# Patient Record
Sex: Male | Born: 1970 | Race: Black or African American | Hispanic: No | Marital: Single | Smoking: Former smoker
Health system: Southern US, Community
[De-identification: ages and names within clinical notes are randomized; demographics above are authoritative.]

## PROBLEM LIST (undated history)

## (undated) DIAGNOSIS — R0600 Dyspnea, unspecified: Secondary | ICD-10-CM

## (undated) DIAGNOSIS — T4145XA Adverse effect of unspecified anesthetic, initial encounter: Secondary | ICD-10-CM

## (undated) DIAGNOSIS — J189 Pneumonia, unspecified organism: Secondary | ICD-10-CM

## (undated) DIAGNOSIS — T8859XA Other complications of anesthesia, initial encounter: Secondary | ICD-10-CM

## (undated) DIAGNOSIS — I255 Ischemic cardiomyopathy: Secondary | ICD-10-CM

## (undated) DIAGNOSIS — I639 Cerebral infarction, unspecified: Secondary | ICD-10-CM

## (undated) DIAGNOSIS — G4730 Sleep apnea, unspecified: Secondary | ICD-10-CM

## (undated) DIAGNOSIS — I10 Essential (primary) hypertension: Secondary | ICD-10-CM

## (undated) DIAGNOSIS — K219 Gastro-esophageal reflux disease without esophagitis: Secondary | ICD-10-CM

## (undated) DIAGNOSIS — I2510 Atherosclerotic heart disease of native coronary artery without angina pectoris: Secondary | ICD-10-CM

## (undated) DIAGNOSIS — E119 Type 2 diabetes mellitus without complications: Secondary | ICD-10-CM

## (undated) HISTORY — DX: Essential (primary) hypertension: I10

## (undated) HISTORY — DX: Ischemic cardiomyopathy: I25.5

## (undated) HISTORY — DX: Morbid (severe) obesity due to excess calories: E66.01

## (undated) HISTORY — DX: Cerebral infarction, unspecified: I63.9

## (undated) HISTORY — DX: Atherosclerotic heart disease of native coronary artery without angina pectoris: I25.10

---

## 1993-11-24 HISTORY — PX: EXTERNAL EAR SURGERY: SHX627

## 2013-09-23 ENCOUNTER — Encounter (HOSPITAL_COMMUNITY): Payer: Self-pay | Admitting: Emergency Medicine

## 2013-09-23 ENCOUNTER — Emergency Department (HOSPITAL_COMMUNITY)
Admission: EM | Admit: 2013-09-23 | Discharge: 2013-09-23 | Disposition: A | Discharge status: Home / Self Care | Payer: Self-pay | Attending: Emergency Medicine | Admitting: Emergency Medicine

## 2013-09-23 DIAGNOSIS — I10 Essential (primary) hypertension: Secondary | ICD-10-CM | POA: Insufficient documentation

## 2013-09-23 DIAGNOSIS — E669 Obesity, unspecified: Secondary | ICD-10-CM | POA: Insufficient documentation

## 2013-09-23 DIAGNOSIS — R040 Epistaxis: Secondary | ICD-10-CM | POA: Insufficient documentation

## 2013-09-23 DIAGNOSIS — E119 Type 2 diabetes mellitus without complications: Secondary | ICD-10-CM | POA: Insufficient documentation

## 2013-09-23 DIAGNOSIS — F17200 Nicotine dependence, unspecified, uncomplicated: Secondary | ICD-10-CM | POA: Insufficient documentation

## 2013-09-23 HISTORY — DX: Type 2 diabetes mellitus without complications: E11.9

## 2013-09-23 NOTE — ED Provider Notes (Signed)
CSN: [id number]     Arrival date & time 09/23/13  0004 History   First MD Initiated Contact with Patient 09/23/13 0025     Chief Complaint  Patient presents with  . Epistaxis   (Consider location/radiation/quality/duration/timing/severity/associated sxs/prior Treatment) HPI 42 year old male presents to emergency room with complaint of nose bleed.  He reports he had a nosebleed starting this morning, but stopped.  It reoccurred again in the afternoon.  He was seen at urgent care, who placed packing and started him on blood pressure medication.  He reports he began to bleed heavily again around the packing this evening around 8:30 PM.  He removed the packing.  He has been using over-the-counter nose plugs in both nares without improvement.  No headache.  No chest pain, no shortness of breath.  He reports he was taking some cold medicine last week for a URI.  Past Medical History  Diagnosis Date  . Diabetes mellitus without complication    History reviewed. No pertinent past surgical history. No family history on file. History  Substance Use Topics  . Smoking status: Current Every Day Smoker  . Smokeless tobacco: Not on file  . Alcohol Use: Yes     Comment: seldom    Review of Systems  Allergies  Bee venom  Home Medications  No current outpatient prescriptions on file. BP 163/86  Pulse 85  Temp(Src) 97.7 F (36.5 C) (Oral)  Resp 15  SpO2 96% Physical Exam  Constitutional: He appears distressed.  Obese male, uncomfortable appearing  HENT:  Head: Normocephalic and atraumatic.  Left Ear: External ear normal.  Mouth/Throat: Oropharynx is clear and moist.  Patient with cotton plugs in both nares  Eyes: Conjunctivae and EOM are normal. Pupils are equal, round, and reactive to light.  Cardiovascular: Normal rate, regular rhythm and normal heart sounds.  Exam reveals no gallop and no friction rub.   No murmur heard. Pulmonary/Chest: Effort normal and breath sounds normal. No  respiratory distress. He has no wheezes. He has no rales. He exhibits no tenderness.    See History of Present Illness; otherwise all other systems are reviewed and negative ED Course  Procedures (including critical care time) Labs Review Labs Reviewed - No data to display Imaging Review No results found.    MDM   1. Epistaxis, recurrent   2. HTN (hypertension)    42 year old male with left-sided epistaxis.  Packing removed.  Had patient blow his nose forcefully all clots removed.  Lidocaine with epinephrine atomized into left narrow.  Patient held pressure for 5 minutes.  No further leading.  Patient observed for an hour without further notice.  Bleeding.  Patient instructed to repeat the process of nose blowing and pressure.  Should he have return of epistaxis.    Olivia Mackie, MD 09/23/13 [phone]

## 2013-09-23 NOTE — ED Notes (Signed)
Pt seen earlier today at [facility] for epistaxis.  Pt states it started around 1040 this morning and then stopped later in the day.  Pt states [facility] gave him BP meds and packed the L nare.  Pt states around 2030 tonight bleeding started again.  Pt has OTC nose plugs in bilateral nares.  Plugs are saturated and dripping blood.  Plugs left in nares at this time.  Pt given cloth to hold over nose to catch blood and also given a chuck to lay across chest.  ENT cart outside room.

## 2016-12-30 ENCOUNTER — Encounter (HOSPITAL_COMMUNITY): Payer: Self-pay | Admitting: Emergency Medicine

## 2016-12-30 ENCOUNTER — Emergency Department (HOSPITAL_COMMUNITY): Payer: BLUE CROSS/BLUE SHIELD

## 2016-12-30 ENCOUNTER — Inpatient Hospital Stay (HOSPITAL_COMMUNITY)
Admission: EM | Admit: 2016-12-30 | Discharge: 2017-01-02 | DRG: 065 | Disposition: A | Discharge status: Rehabilitation Facility | Payer: BLUE CROSS/BLUE SHIELD | Attending: Internal Medicine | Admitting: Internal Medicine

## 2016-12-30 DIAGNOSIS — R29810 Facial weakness: Secondary | ICD-10-CM | POA: Diagnosis present

## 2016-12-30 DIAGNOSIS — R739 Hyperglycemia, unspecified: Secondary | ICD-10-CM

## 2016-12-30 DIAGNOSIS — I10 Essential (primary) hypertension: Secondary | ICD-10-CM | POA: Diagnosis present

## 2016-12-30 DIAGNOSIS — G463 Brain stem stroke syndrome: Secondary | ICD-10-CM | POA: Diagnosis present

## 2016-12-30 DIAGNOSIS — Z6841 Body Mass Index (BMI) 40.0 and over, adult: Secondary | ICD-10-CM

## 2016-12-30 DIAGNOSIS — R51 Headache: Secondary | ICD-10-CM | POA: Diagnosis not present

## 2016-12-30 DIAGNOSIS — E871 Hypo-osmolality and hyponatremia: Secondary | ICD-10-CM | POA: Diagnosis present

## 2016-12-30 DIAGNOSIS — E785 Hyperlipidemia, unspecified: Secondary | ICD-10-CM | POA: Diagnosis present

## 2016-12-30 DIAGNOSIS — Z91030 Bee allergy status: Secondary | ICD-10-CM

## 2016-12-30 DIAGNOSIS — Z79899 Other long term (current) drug therapy: Secondary | ICD-10-CM

## 2016-12-30 DIAGNOSIS — Z87891 Personal history of nicotine dependence: Secondary | ICD-10-CM

## 2016-12-30 DIAGNOSIS — E119 Type 2 diabetes mellitus without complications: Secondary | ICD-10-CM

## 2016-12-30 DIAGNOSIS — Z8249 Family history of ischemic heart disease and other diseases of the circulatory system: Secondary | ICD-10-CM

## 2016-12-30 DIAGNOSIS — E1165 Type 2 diabetes mellitus with hyperglycemia: Secondary | ICD-10-CM | POA: Diagnosis present

## 2016-12-30 DIAGNOSIS — I639 Cerebral infarction, unspecified: Secondary | ICD-10-CM | POA: Diagnosis present

## 2016-12-30 DIAGNOSIS — R260 Ataxic gait: Secondary | ICD-10-CM | POA: Diagnosis present

## 2016-12-30 DIAGNOSIS — R509 Fever, unspecified: Secondary | ICD-10-CM

## 2016-12-30 DIAGNOSIS — R519 Headache, unspecified: Secondary | ICD-10-CM

## 2016-12-30 DIAGNOSIS — Z7982 Long term (current) use of aspirin: Secondary | ICD-10-CM

## 2016-12-30 DIAGNOSIS — Z7951 Long term (current) use of inhaled steroids: Secondary | ICD-10-CM

## 2016-12-30 DIAGNOSIS — Z7984 Long term (current) use of oral hypoglycemic drugs: Secondary | ICD-10-CM

## 2016-12-30 DIAGNOSIS — E860 Dehydration: Secondary | ICD-10-CM | POA: Diagnosis present

## 2016-12-30 DIAGNOSIS — I63212 Cerebral infarction due to unspecified occlusion or stenosis of left vertebral arteries: Principal | ICD-10-CM | POA: Diagnosis present

## 2016-12-30 DIAGNOSIS — Z8349 Family history of other endocrine, nutritional and metabolic diseases: Secondary | ICD-10-CM

## 2016-12-30 DIAGNOSIS — N179 Acute kidney failure, unspecified: Secondary | ICD-10-CM | POA: Diagnosis present

## 2016-12-30 DIAGNOSIS — D72829 Elevated white blood cell count, unspecified: Secondary | ICD-10-CM | POA: Diagnosis present

## 2016-12-30 DIAGNOSIS — Z79891 Long term (current) use of opiate analgesic: Secondary | ICD-10-CM

## 2016-12-30 HISTORY — DX: Cerebral infarction, unspecified: I63.9

## 2016-12-30 LAB — I-STAT TROPONIN, ED: TROPONIN I, POC: 0 ng/mL (ref 0.00–0.08)

## 2016-12-30 LAB — I-STAT CHEM 8, ED
BUN: 23 mg/dL — ABNORMAL HIGH (ref 6–20)
CALCIUM ION: 1.15 mmol/L (ref 1.15–1.40)
Chloride: 88 mmol/L — ABNORMAL LOW (ref 101–111)
Creatinine, Ser: 1.3 mg/dL — ABNORMAL HIGH (ref 0.61–1.24)
Glucose, Bld: 429 mg/dL — ABNORMAL HIGH (ref 65–99)
HEMATOCRIT: 51 % (ref 39.0–52.0)
HEMOGLOBIN: 17.3 g/dL — AB (ref 13.0–17.0)
Potassium: 4.1 mmol/L (ref 3.5–5.1)
SODIUM: 134 mmol/L — AB (ref 135–145)
TCO2: 33 mmol/L (ref 0–100)

## 2016-12-30 LAB — DIFFERENTIAL
BASOS ABS: 0 10*3/uL (ref 0.0–0.1)
BASOS PCT: 0 %
EOS ABS: 0.1 10*3/uL (ref 0.0–0.7)
EOS PCT: 1 %
LYMPHS ABS: 2.5 10*3/uL (ref 0.7–4.0)
Lymphocytes Relative: 21 %
Monocytes Absolute: 0.6 10*3/uL (ref 0.1–1.0)
Monocytes Relative: 5 %
NEUTROS PCT: 73 %
Neutro Abs: 8.9 10*3/uL — ABNORMAL HIGH (ref 1.7–7.7)

## 2016-12-30 LAB — CBG MONITORING, ED: Glucose-Capillary: 453 mg/dL — ABNORMAL HIGH (ref 65–99)

## 2016-12-30 LAB — CBC
HCT: 46.2 % (ref 39.0–52.0)
HEMOGLOBIN: 15.8 g/dL (ref 13.0–17.0)
MCH: 29.8 pg (ref 26.0–34.0)
MCHC: 34.2 g/dL (ref 30.0–36.0)
MCV: 87.2 fL (ref 78.0–100.0)
Platelets: 267 10*3/uL (ref 150–400)
RBC: 5.3 MIL/uL (ref 4.22–5.81)
RDW: 13.1 % (ref 11.5–15.5)
WBC: 12.2 10*3/uL — ABNORMAL HIGH (ref 4.0–10.5)

## 2016-12-30 LAB — COMPREHENSIVE METABOLIC PANEL
ALBUMIN: 3.5 g/dL (ref 3.5–5.0)
ALT: 16 U/L — ABNORMAL LOW (ref 17–63)
ANION GAP: 10 (ref 5–15)
AST: 21 U/L (ref 15–41)
Alkaline Phosphatase: 53 U/L (ref 38–126)
BILIRUBIN TOTAL: 0.4 mg/dL (ref 0.3–1.2)
BUN: 20 mg/dL (ref 6–20)
CO2: 32 mmol/L (ref 22–32)
Calcium: 8.9 mg/dL (ref 8.9–10.3)
Chloride: 89 mmol/L — ABNORMAL LOW (ref 101–111)
Creatinine, Ser: 1.34 mg/dL — ABNORMAL HIGH (ref 0.61–1.24)
GFR calc Af Amer: >60 mL/min (ref >60)
GLUCOSE: 415 mg/dL — AB (ref 65–99)
POTASSIUM: 4.2 mmol/L (ref 3.5–5.1)
Sodium: 131 mmol/L — ABNORMAL LOW (ref 135–145)
TOTAL PROTEIN: 7.4 g/dL (ref 6.5–8.1)

## 2016-12-30 LAB — URINALYSIS, ROUTINE W REFLEX MICROSCOPIC
Bilirubin Urine: NEGATIVE
Ketones, ur: NEGATIVE mg/dL
Leukocytes, UA: NEGATIVE
NITRITE: NEGATIVE
PH: 5 (ref 5.0–8.0)
Protein, ur: 30 mg/dL — AB
SPECIFIC GRAVITY, URINE: 1.028 (ref 1.005–1.030)
Squamous Epithelial / LPF: NONE SEEN

## 2016-12-30 LAB — PROTIME-INR
INR: 0.98
PROTHROMBIN TIME: 13 s (ref 11.4–15.2)

## 2016-12-30 LAB — APTT: APTT: 30 s (ref 24–36)

## 2016-12-30 MED ORDER — SODIUM CHLORIDE 0.9 % IV BOLUS (SEPSIS)
1000.0000 mL | Freq: Once | INTRAVENOUS | Status: AC
  Administered 2016-12-30: 1000 mL via INTRAVENOUS

## 2016-12-30 NOTE — ED Provider Notes (Signed)
[facility]-EMERGENCY DEPT Provider Note   CSN: [id number] Arrival date & time: 12/30/16  2128  By signing my name below, I, Nicholas Mejia, attest that this documentation has been prepared under the direction and in the presence of Nicholas Baton, MD.  Electronically Signed: Octavia Mejia, ED Scribe. 12/30/16. 11:50 PM.    History   Chief Complaint Chief Complaint  Patient presents with  . Hyperglycemia  . Numbness  . Weakness   The history is provided by the patient and the EMS personnel. No language interpreter was used.   HPI Comments: Nicholas Mejia is a 46 y.o. male brought in by ambulance, who has a PMHx of DM presents to the Emergency Department complaining of moderate, unchanged left sided facial numbness that began yesterday ~ 12.5 hours ago. Pt reports having an associated headache for the past few days that became gradually worse yesterday. He described as pressure behind his left eye. He notes being seen yesterday for his headache and received toradol and an "unknown blood pressure medication". He notes shortly after he began to have left sided facial numbness. Pt states he went home and fell asleep. When he woke up he noted weakness in his legs and reports difficulty ambulating, stating that he drifted to left. He says that he felt nauseous and diaphoretic as well. Pt has taken extra strength tylenol twice to alleviate his headache which gave him temporary relief the first time but did not modify his pain at all the second time. Pt was diagnosed with pneumonia two weeks ago and finished his course of antibiotic 5 days ago. A1C was 7.9 one month ago. Pt is compliant with his Metformin. Pt does not have any current pain. He denies visual disturbances, chest pain, and shortness of breath.  Past Medical History:  Diagnosis Date  . Diabetes mellitus without complication [facility])     Patient Active Problem List   Diagnosis Date Noted  . 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 Diabetes  mellitus without complication (HCC) 12/31/2016  . AKI (acute kidney injury) (HCC) 12/31/2016  . Tobacco abuse 12/31/2016    Past Surgical History:  Procedure Laterality Date  . EXTERNAL EAR SURGERY         Home Medications    Prior to Admission medications   Medication Sig Start Date End Date Taking? Authorizing Provider  aspirin EC 81 MG tablet Take 162 mg by mouth daily.   Yes Historical Provider, MD  chlorthalidone (HYGROTON) 25 MG tablet Take 25 mg by mouth daily.   Yes Historical Provider, MD  fluticasone (FLONASE) 50 MCG/ACT nasal spray Place 1 spray into both nostrils daily.   Yes Historical Provider, MD  fluticasone-salmeterol (ADVAIR HFA) 115-21 MCG/ACT inhaler Inhale 2 puffs into the lungs 2 (two) times daily as needed (shortness of breath).   Yes Historical Provider, MD  guaiFENesin-codeine 100-10 MG/5ML syrup Take 5 mLs by mouth 3 (three) times daily as needed for cough.   Yes Historical Provider, MD  metFORMIN (GLUCOPHAGE-XR) 500 MG 24 hr tablet Take 500 mg by mouth See admin instructions. Take 1 tablet every morning, takes another tablet in the evening if remembers   Yes Historical Provider, MD  montelukast (SINGULAIR) 10 MG tablet Take 10 mg by mouth at bedtime.   Yes Historical Provider, MD  Turmeric 500 MG TABS Take 1,000 mg by mouth 2 (two) times daily.   Yes Historical Provider, MD    Family History Family History  Problem Relation Age of Onset  . Heart attack Father   .  Gout Brother     Social History Social History  Substance Use Topics  . Smoking status: Former Games developermoker  . Smokeless tobacco: Not on file  . Alcohol use Yes     Comment: seldom     Allergies   Bee venom   Review of Systems Review of Systems  Constitutional: Positive for diaphoresis (resolved). Negative for fever.  Eyes: Negative for visual disturbance.  Respiratory: Negative for shortness of breath.   Cardiovascular: Negative for chest pain.  Gastrointestinal: Positive for nausea  (resolved).  Neurological: Positive for weakness, numbness (left sided facial numbness) and headaches (resolved).  All other systems reviewed and are negative.    Physical Exam Updated Vital Signs BP (!) 203/98   Pulse 85   Temp 98.5 F (36.9 C) (Oral)   Resp 17   Ht 6\' 2"  (1.88 m)   Wt (!) 320 lb (145.2 kg)   SpO2 100%   BMI 41.09 kg/m   Physical Exam  Constitutional: He is oriented to person, place, and time. He appears well-developed and well-nourished.  overweight  HENT:  Head: Normocephalic and atraumatic.  Eyes: EOM are normal. Pupils are equal, round, and reactive to light.  Neck: Neck supple.  Cardiovascular: Normal rate, regular rhythm and normal heart sounds.   No murmur heard. Pulmonary/Chest: Effort normal and breath sounds normal. No respiratory distress. He has no wheezes.  Abdominal: Soft. Bowel sounds are normal. There is no tenderness. There is no rebound.  Musculoskeletal: He exhibits no edema.  Neurological: He is alert and oriented to person, place, and time.  Renal nerves II through XII intact, 5 out of 5 strength in all 4 extremities, no dysmetria to finger-nose-finger, on gait testing, patient very unsteady and appears to favor left, unable to tandem gait  Skin: Skin is warm and dry.  Psychiatric: He has a normal mood and affect.  Nursing note and vitals reviewed.    ED Treatments / Results  DIAGNOSTIC STUDIES: Oxygen Saturation is 98% on RA, normal by my interpretation.  COORDINATION OF CARE:  11:34 PM Discussed treatment plan with pt at bedside and pt agreed to plan.  Labs (all labs ordered are listed, but only abnormal results are displayed) Labs Reviewed  CBC - Abnormal; Notable for the following:       Result Value   WBC 12.2 (*)    All other components within normal limits  DIFFERENTIAL - Abnormal; Notable for the following:    Neutro Abs 8.9 (*)    All other components within normal limits  COMPREHENSIVE METABOLIC PANEL - Abnormal;  Notable for the following:    Sodium 131 (*)    Chloride 89 (*)    Glucose, Bld 415 (*)    Creatinine, Ser 1.34 (*)    ALT 16 (*)    All other components within normal limits  URINALYSIS, ROUTINE W REFLEX MICROSCOPIC - Abnormal; Notable for the following:    Glucose, UA >=500 (*)    Hgb urine dipstick SMALL (*)    Protein, ur 30 (*)    Bacteria, UA RARE (*)    All other components within normal limits  CBG MONITORING, ED - Abnormal; Notable for the following:    Glucose-Capillary 453 (*)    All other components within normal limits  I-STAT CHEM 8, ED - Abnormal; Notable for the following:    Sodium 134 (*)    Chloride 88 (*)    BUN 23 (*)    Creatinine, Ser 1.30 (*)  Glucose, Bld 429 (*)    Hemoglobin 17.3 (*)    All other components within normal limits  PROTIME-INR  APTT  I-STAT TROPOININ, ED    EKG  EKG Interpretation  Date/Time:  Tuesday December 30 2016 21:31:59 EST Ventricular Rate:  85 PR Interval:  194 QRS Duration: 82 QT Interval:  366 QTC Calculation: 435 R Axis:   133 Text Interpretation:  Normal sinus rhythm Biatrial enlargement Right axis deviation Pulmonary disease pattern Right ventricular hypertrophy T wave abnormality, consider inferior ischemia Abnormal ECG Confirmed by [facility] MD, Barbara Cower [id number]) on 12/30/2016 9:56:05 PM       Radiology Ct Head Wo Contrast  Result Date: 12/30/2016 CLINICAL DATA:  Left-sided numbness starting at 11 a.m. History of hypertension. EXAM: CT HEAD WITHOUT CONTRAST TECHNIQUE: Contiguous axial images were obtained from the base of the skull through the vertex without intravenous contrast. COMPARISON:  None. FINDINGS: Brain: No evidence of acute infarction, hemorrhage, hydrocephalus, extra-axial collection or mass lesion/mass effect. Vascular: Vascular calcifications are present. Skull: Normal. Negative for fracture or focal lesion. Sinuses/Orbits: No acute finding. Other: None. IMPRESSION: No acute intracranial abnormalities.  Electronically Signed   By: Burman Nieves M.D.   On: 12/30/2016 22:25   Mr Brain Wo Contrast  Result Date: 12/31/2016 CLINICAL DATA:  46 y/o M; left-sided facial numbness, headache, pressure behind the left eye, and weakness in the legs. EXAM: MRI HEAD WITHOUT CONTRAST TECHNIQUE: Multiplanar, multiecho pulse sequences of the brain and surrounding structures were obtained without intravenous contrast. COMPARISON:  12/30/2016 CT head. FINDINGS: Brain: Subcentimeter focus of diffusion restriction within the left posterolateral medulla. Scattered foci of T2 FLAIR hyperintensity in subcortical and periventricular white matter are nonspecific. Mild diffuse brain parenchymal volume loss. No abnormal susceptibility hypointensity to indicate intracranial hemorrhage. No focal mass effect, hydrocephalus, or extra-axial collection. Vascular: Normal flow voids. Skull and upper cervical spine: Normal marrow signal. Sinuses/Orbits: Negative.  Underpneumatized frontal sinuses. Other: None. IMPRESSION: 1. Subcentimeter acute/early subacute infarction within the left posterolateral medulla. 2. Nonspecific T2 FLAIR hyperintense white matter foci unexpected for age probably represent microvascular ischemic changes particularly in the setting of diabetes, less likely migraine headache or sequelae of demyelination, vasculitis, and other infectious/inflammatory processes. These results were called by telephone at the time of interpretation on 12/31/2016 at 12:52 am to Dr. Ross Marcus , who verbally acknowledged these results. Electronically Signed   By: Mitzi Hansen M.D.   On: 12/31/2016 00:53    Procedures Procedures (including critical care time)  Medications Ordered in ED Medications  insulin glargine (LANTUS) injection 3 Units (not administered)  insulin aspart (novoLOG) injection 0-9 Units (not administered)  0.9 %  sodium chloride infusion (not administered)  sodium chloride 0.9 % bolus 1,000 mL (0 mLs  Intravenous Stopped 12/31/16 0140)     Initial Impression / Assessment and Plan / ED Course  I have reviewed the triage vital signs and the nursing notes.  Pertinent labs & imaging results that were available during my care of the patient were reviewed by me and considered in my medical decision making (see chart for details).     Patient presents with headache and left-sided facial numbness as well as gait difficulty. Reports symptom onset on Sunday with noticing gait disturbance on Monday evening. Has seen his primary doctor earlier this morning. Nontoxic. Vital signs notable for hypertension. He has fluent speech. Initial neuro exam is reassuring; however, he has difficulty with normal gait and is unable to tandem gait. CT scan from triage is  negative. Patient given fluids for his hyperglycemia. MRI ordered. MRI concerning for subacute infarct. Discussed with neurology. Will admit to the hospitalist.  Final Clinical Impressions(s) / ED Diagnoses   Final diagnoses:  Cerebrovascular accident (CVA), unspecified mechanism (HCC)  Hyperglycemia    I personally performed the services described in this documentation, which was scribed in my presence. The recorded information has been reviewed and is accurate.  New Prescriptions New Prescriptions   No medications on file     Nicholas Baton, MD 12/31/16 [phone]

## 2016-12-30 NOTE — ED Triage Notes (Signed)
Per EMS,  Pt began to experience headache and  left sided facial numbness about 11am yesterday.  Pt went went to his primary provider today.  BP was "high" given some medication (not sure what) which decreased bp.  Was told to come back   Was given a shot of tordol, which made the headache feel better temporally.  In triage he had trouble walking, drifted to the left.

## 2016-12-31 ENCOUNTER — Encounter (HOSPITAL_COMMUNITY): Payer: Self-pay | Admitting: Internal Medicine

## 2016-12-31 ENCOUNTER — Encounter: Payer: Self-pay | Admitting: *Deleted

## 2016-12-31 ENCOUNTER — Inpatient Hospital Stay (HOSPITAL_COMMUNITY): Payer: BLUE CROSS/BLUE SHIELD

## 2016-12-31 ENCOUNTER — Emergency Department (HOSPITAL_COMMUNITY): Payer: BLUE CROSS/BLUE SHIELD

## 2016-12-31 DIAGNOSIS — E1165 Type 2 diabetes mellitus with hyperglycemia: Secondary | ICD-10-CM | POA: Diagnosis not present

## 2016-12-31 DIAGNOSIS — I6789 Other cerebrovascular disease: Secondary | ICD-10-CM

## 2016-12-31 DIAGNOSIS — E119 Type 2 diabetes mellitus without complications: Secondary | ICD-10-CM

## 2016-12-31 DIAGNOSIS — I10 Essential (primary) hypertension: Secondary | ICD-10-CM | POA: Diagnosis not present

## 2016-12-31 DIAGNOSIS — Z7951 Long term (current) use of inhaled steroids: Secondary | ICD-10-CM | POA: Diagnosis not present

## 2016-12-31 DIAGNOSIS — R29810 Facial weakness: Secondary | ICD-10-CM | POA: Diagnosis present

## 2016-12-31 DIAGNOSIS — R51 Headache: Secondary | ICD-10-CM | POA: Diagnosis present

## 2016-12-31 DIAGNOSIS — I63012 Cerebral infarction due to thrombosis of left vertebral artery: Secondary | ICD-10-CM | POA: Diagnosis not present

## 2016-12-31 DIAGNOSIS — D72829 Elevated white blood cell count, unspecified: Secondary | ICD-10-CM | POA: Diagnosis not present

## 2016-12-31 DIAGNOSIS — I63112 Cerebral infarction due to embolism of left vertebral artery: Secondary | ICD-10-CM | POA: Diagnosis not present

## 2016-12-31 DIAGNOSIS — N179 Acute kidney failure, unspecified: Secondary | ICD-10-CM

## 2016-12-31 DIAGNOSIS — N183 Chronic kidney disease, stage 3 (moderate): Secondary | ICD-10-CM | POA: Diagnosis not present

## 2016-12-31 DIAGNOSIS — G463 Brain stem stroke syndrome: Secondary | ICD-10-CM | POA: Diagnosis not present

## 2016-12-31 DIAGNOSIS — I639 Cerebral infarction, unspecified: Secondary | ICD-10-CM | POA: Diagnosis present

## 2016-12-31 DIAGNOSIS — E1159 Type 2 diabetes mellitus with other circulatory complications: Secondary | ICD-10-CM | POA: Diagnosis not present

## 2016-12-31 DIAGNOSIS — R112 Nausea with vomiting, unspecified: Secondary | ICD-10-CM | POA: Diagnosis not present

## 2016-12-31 DIAGNOSIS — G43A1 Cyclical vomiting, intractable: Secondary | ICD-10-CM | POA: Diagnosis not present

## 2016-12-31 DIAGNOSIS — E782 Mixed hyperlipidemia: Secondary | ICD-10-CM | POA: Diagnosis not present

## 2016-12-31 DIAGNOSIS — I69393 Ataxia following cerebral infarction: Secondary | ICD-10-CM | POA: Diagnosis not present

## 2016-12-31 DIAGNOSIS — Z006 Encounter for examination for normal comparison and control in clinical research program: Secondary | ICD-10-CM

## 2016-12-31 DIAGNOSIS — I63212 Cerebral infarction due to unspecified occlusion or stenosis of left vertebral arteries: Secondary | ICD-10-CM | POA: Diagnosis present

## 2016-12-31 DIAGNOSIS — Z8349 Family history of other endocrine, nutritional and metabolic diseases: Secondary | ICD-10-CM | POA: Diagnosis not present

## 2016-12-31 DIAGNOSIS — I63011 Cerebral infarction due to thrombosis of right vertebral artery: Secondary | ICD-10-CM | POA: Diagnosis not present

## 2016-12-31 DIAGNOSIS — E785 Hyperlipidemia, unspecified: Secondary | ICD-10-CM | POA: Diagnosis present

## 2016-12-31 DIAGNOSIS — Z79891 Long term (current) use of opiate analgesic: Secondary | ICD-10-CM | POA: Diagnosis not present

## 2016-12-31 DIAGNOSIS — K30 Functional dyspepsia: Secondary | ICD-10-CM | POA: Diagnosis not present

## 2016-12-31 DIAGNOSIS — E860 Dehydration: Secondary | ICD-10-CM | POA: Diagnosis present

## 2016-12-31 DIAGNOSIS — Z8249 Family history of ischemic heart disease and other diseases of the circulatory system: Secondary | ICD-10-CM | POA: Diagnosis not present

## 2016-12-31 DIAGNOSIS — N182 Chronic kidney disease, stage 2 (mild): Secondary | ICD-10-CM | POA: Diagnosis not present

## 2016-12-31 DIAGNOSIS — Z87891 Personal history of nicotine dependence: Secondary | ICD-10-CM | POA: Diagnosis not present

## 2016-12-31 DIAGNOSIS — Z79899 Other long term (current) drug therapy: Secondary | ICD-10-CM | POA: Diagnosis not present

## 2016-12-31 DIAGNOSIS — Z794 Long term (current) use of insulin: Secondary | ICD-10-CM | POA: Diagnosis not present

## 2016-12-31 DIAGNOSIS — E118 Type 2 diabetes mellitus with unspecified complications: Secondary | ICD-10-CM | POA: Diagnosis not present

## 2016-12-31 DIAGNOSIS — Z91030 Bee allergy status: Secondary | ICD-10-CM | POA: Diagnosis not present

## 2016-12-31 DIAGNOSIS — E871 Hypo-osmolality and hyponatremia: Secondary | ICD-10-CM | POA: Diagnosis not present

## 2016-12-31 DIAGNOSIS — I6300 Cerebral infarction due to thrombosis of unspecified precerebral artery: Secondary | ICD-10-CM

## 2016-12-31 DIAGNOSIS — Z7984 Long term (current) use of oral hypoglycemic drugs: Secondary | ICD-10-CM | POA: Diagnosis not present

## 2016-12-31 DIAGNOSIS — Z6841 Body Mass Index (BMI) 40.0 and over, adult: Secondary | ICD-10-CM | POA: Diagnosis not present

## 2016-12-31 DIAGNOSIS — R260 Ataxic gait: Secondary | ICD-10-CM | POA: Diagnosis present

## 2016-12-31 DIAGNOSIS — Z7982 Long term (current) use of aspirin: Secondary | ICD-10-CM | POA: Diagnosis not present

## 2016-12-31 DIAGNOSIS — K219 Gastro-esophageal reflux disease without esophagitis: Secondary | ICD-10-CM | POA: Diagnosis not present

## 2016-12-31 LAB — ECHOCARDIOGRAM COMPLETE
HEIGHTINCHES: 74 in
WEIGHTICAEL: 5216 [oz_av]

## 2016-12-31 LAB — CREATININE, URINE, RANDOM: Creatinine, Urine: 134.76 mg/dL

## 2016-12-31 LAB — GLUCOSE, CAPILLARY
GLUCOSE-CAPILLARY: 356 mg/dL — AB (ref 65–99)
GLUCOSE-CAPILLARY: 252 mg/dL — AB (ref 65–99)
Glucose-Capillary: 414 mg/dL — ABNORMAL HIGH (ref 65–99)
Glucose-Capillary: 398 mg/dL — ABNORMAL HIGH (ref 65–99)
Glucose-Capillary: 266 mg/dL — ABNORMAL HIGH (ref 65–99)

## 2016-12-31 LAB — RAPID URINE DRUG SCREEN, HOSP PERFORMED
Amphetamines: NOT DETECTED
Barbiturates: NOT DETECTED
Benzodiazepines: NOT DETECTED
Cocaine: NOT DETECTED
OPIATES: NOT DETECTED
TETRAHYDROCANNABINOL: NOT DETECTED

## 2016-12-31 LAB — TROPONIN I
TROPONIN I: 0.03 ng/mL — AB (ref <0.03)
Troponin I: <0.03 ng/mL (ref <0.03)

## 2016-12-31 LAB — SODIUM, URINE, RANDOM: Sodium, Ur: 11 mmol/L

## 2016-12-31 MED ORDER — ASPIRIN 325 MG PO TABS
325.0000 mg | ORAL_TABLET | Freq: Every day | ORAL | Status: DC
  Administered 2017-01-02: 325 mg via ORAL
  Filled 2016-12-31 (×2): qty 1

## 2016-12-31 MED ORDER — SENNOSIDES-DOCUSATE SODIUM 8.6-50 MG PO TABS: 1.0000 | ORAL_TABLET | Freq: Every evening | ORAL | Status: DC | PRN

## 2016-12-31 MED ORDER — ACETAMINOPHEN 325 MG PO TABS: 650.0000 mg | ORAL_TABLET | ORAL | Status: DC | PRN

## 2016-12-31 MED ORDER — ONDANSETRON HCL 4 MG/2ML IJ SOLN
4.0000 mg | Freq: Four times a day (QID) | INTRAMUSCULAR | Status: DC | PRN
  Administered 2016-12-31 – 2017-01-02 (×5): 4 mg via INTRAVENOUS
  Filled 2016-12-31 (×5): qty 2

## 2016-12-31 MED ORDER — INSULIN GLARGINE 100 UNIT/ML ~~LOC~~ SOLN
3.0000 [IU] | Freq: Every day | SUBCUTANEOUS | Status: DC
  Filled 2016-12-31: qty 0.03

## 2016-12-31 MED ORDER — ASPIRIN 325 MG PO TABS
325.0000 mg | ORAL_TABLET | Freq: Every day | ORAL | Status: DC
  Administered 2016-12-31: 325 mg via ORAL
  Filled 2016-12-31: qty 1

## 2016-12-31 MED ORDER — INSULIN GLARGINE 100 UNIT/ML ~~LOC~~ SOLN
10.0000 [IU] | Freq: Once | SUBCUTANEOUS | Status: AC
  Administered 2016-12-31: 10 [IU] via SUBCUTANEOUS
  Filled 2016-12-31: qty 0.1

## 2016-12-31 MED ORDER — ATORVASTATIN CALCIUM 40 MG PO TABS
40.0000 mg | ORAL_TABLET | Freq: Every day | ORAL | Status: DC
  Administered 2016-12-31 – 2017-01-01 (×3): 40 mg via ORAL
  Filled 2016-12-31 (×3): qty 1

## 2016-12-31 MED ORDER — STROKE: EARLY STAGES OF RECOVERY BOOK
Freq: Once | Status: AC
  Administered 2016-12-31: 07:00:00
  Filled 2016-12-31: qty 1

## 2016-12-31 MED ORDER — ASPIRIN 300 MG RE SUPP: 300.0000 mg | Freq: Every day | RECTAL | Status: DC

## 2016-12-31 MED ORDER — CLOPIDOGREL BISULFATE 75 MG PO TABS
75.0000 mg | ORAL_TABLET | Freq: Every day | ORAL | Status: DC
  Administered 2016-12-31 – 2017-01-02 (×2): 75 mg via ORAL
  Filled 2016-12-31 (×3): qty 1

## 2016-12-31 MED ORDER — SODIUM CHLORIDE 0.9 % IV SOLN
INTRAVENOUS | Status: DC
  Administered 2016-12-31: 03:00:00 via INTRAVENOUS

## 2016-12-31 MED ORDER — ENOXAPARIN SODIUM 40 MG/0.4ML ~~LOC~~ SOLN
40.0000 mg | SUBCUTANEOUS | Status: DC
  Administered 2016-12-31 – 2017-01-02 (×3): 40 mg via SUBCUTANEOUS
  Filled 2016-12-31 (×3): qty 0.4

## 2016-12-31 MED ORDER — IOPAMIDOL (ISOVUE-370) INJECTION 76%
INTRAVENOUS | Status: AC
Start: 2016-12-31 — End: 2016-12-31
  Administered 2016-12-31: 50 mL via INTRAVENOUS
  Filled 2016-12-31: qty 50

## 2016-12-31 MED ORDER — INSULIN ASPART 100 UNIT/ML ~~LOC~~ SOLN
0.0000 [IU] | Freq: Three times a day (TID) | SUBCUTANEOUS | Status: DC
Start: 2016-12-31 — End: 2017-01-02
  Administered 2016-12-31: 5 [IU] via SUBCUTANEOUS
  Administered 2016-12-31 (×2): 9 [IU] via SUBCUTANEOUS
  Administered 2017-01-01 (×2): 5 [IU] via SUBCUTANEOUS
  Administered 2017-01-01: 3 [IU] via SUBCUTANEOUS
  Administered 2017-01-02: 5 [IU] via SUBCUTANEOUS
  Administered 2017-01-02: 3 [IU] via SUBCUTANEOUS

## 2016-12-31 MED ORDER — INSULIN GLARGINE 100 UNIT/ML ~~LOC~~ SOLN
20.0000 [IU] | Freq: Every day | SUBCUTANEOUS | Status: DC
  Administered 2016-12-31 – 2017-01-02 (×2): 20 [IU] via SUBCUTANEOUS
  Filled 2016-12-31 (×3): qty 0.2

## 2016-12-31 MED ORDER — ACETAMINOPHEN 650 MG RE SUPP: 650.0000 mg | RECTAL | Status: DC | PRN

## 2016-12-31 MED ORDER — ACETAMINOPHEN 160 MG/5ML PO SOLN: 650.0000 mg | ORAL | Status: DC | PRN

## 2016-12-31 NOTE — Evaluation (Signed)
Occupational Therapy Evaluation Patient Details Name: Nicholas Mejia MRN: [id number] DOB: 07/13/1971 Today's Date: 12/31/2016    History of Present Illness 46 yo male admitted with L facial numbness, HA and gait instability. MRI + Subcentimeter acute/early subacute infarction within the leftposterolateral medulla.T2 FLAIR hyperintense white matter foci unexpectedfor age probably represent microvascular ischemic changes particularly in the setting of diabetes, less likely migraine headache or sequelae of demyelination, vasculitis, and other infectious/inflammatory processes. CT neck (+)  calcified plaque at the right vertebral artery origin with 70% stenosis. There is noncalcified plaque at the left vertebral artery origin with 50% stenosis. There is atherosclerotic disease affecting the proximal intracranial vertebral arteries with severe stenosis on the left beyond PICA, 70% or greater. There is stenosis of the distal right vertebral artery just proximal to the basilar estimatedat 50%.   Clinical Impression   PT admitted with L medulla infarct. Pt currently with functional limitiations due to the deficits listed below (see OT problem list). PTA was working and independent. Pt currently plans to have family (A) upon d/c .  Pt will benefit from skilled OT to increase their independence and safety with adls and balance to allow discharge outpatient for balance.    Follow Up Recommendations    outpatient   Equipment Recommendations  None recommended by OT    Recommendations for Other Services       Precautions / Restrictions Precautions Precautions: Fall      Mobility Bed Mobility Overal bed mobility: Independent                Transfers Overall transfer level: Needs assistance Equipment used: Rolling walker (2 wheeled) Transfers: Sit to/from Stand Sit to Stand: Min guard         General transfer comment: pt requires hands to push up from chair     Balance Overall  balance assessment: Needs assistance Sitting-balance support: No upper extremity supported;Feet supported       Standing balance support: Single extremity supported;During functional activity Standing balance-Leahy Scale: Poor                              ADL Overall ADL's : Needs assistance/impaired Eating/Feeding: Independent   Grooming: Wash/dry hands;Wash/dry face;Oral care;Min guard;Standing Grooming Details (indicate cue type and reason): pt static standing able to complete task but requires UE support Upper Body Bathing: Min guard   Lower Body Bathing: Min guard           Toilet Transfer: RW;Moderate assistance           Functional mobility during ADLs: Moderate assistance;Rolling walker General ADL Comments: pt demonstrates scissoring and falling left with transfer. pt able to sit eOB and don shoes MOD I . pt able to static stand at sink in [place] min guard (A). pt however when transfering immediately falling L      Vision     Perception     Praxis      Pertinent Vitals/Pain Pain Assessment: No/denies pain     Hand Dominance Right   Extremity/Trunk Assessment Upper Extremity Assessment Upper Extremity Assessment: Overall WFL for tasks assessed   Lower Extremity Assessment Lower Extremity Assessment: Defer to PT evaluation   Cervical / Trunk Assessment Cervical / Trunk Assessment: Normal   Communication Communication Communication: No difficulties   Cognition Arousal/Alertness: Awake/alert Behavior During Therapy: WFL for tasks assessed/performed Overall Cognitive Status: Impaired/Different from baseline Area of Impairment: Safety/judgement  Safety/Judgement: Decreased awareness of deficits     General Comments: Pt reports "i fall to the Left" but pt feels that tennis shoes and stretching L will help resolve deficit. Pt becoming more aware of deficits with each attempting at transfer   General Comments        Exercises       Shoulder Instructions      Home Living Family/patient expects to be discharged to:: Private residence Living Arrangements: Alone Available Help at Discharge: Friend(s);Available PRN/intermittently Type of Home: Apartment Home Access: Stairs to enter           Bathroom Shower/Tub: Chief Strategy Officer: Standard     Home Equipment: None          Prior Functioning/Environment Level of Independence: Independent                 OT Problem List: Decreased strength;Decreased activity tolerance;Impaired balance (sitting and/or standing);Decreased safety awareness;Decreased knowledge of use of DME or AE;Decreased knowledge of precautions;Obesity   OT Treatment/Interventions: Self-care/ADL training;Therapeutic exercise;Neuromuscular education;DME and/or AE instruction;Therapeutic activities;Patient/family education;Balance training    OT Goals(Current goals can be found in the care plan section) Acute Rehab OT Goals Patient Stated Goal: to return home - "i got to get up out of here" OT Goal Formulation: With patient Time For Goal Achievement: 01/14/17 Potential to Achieve Goals: Good  OT Frequency: Min 3X/week   Barriers to D/C:            Co-evaluation              End of Session Equipment Utilized During Treatment: Gait belt;Rolling walker Nurse Communication: Mobility status;Precautions  Activity Tolerance: Patient tolerated treatment well Patient left: Other (comment) (in hall with PT Cyndi)   Time: 1610-9604 OT Time Calculation (min): 37 min Charges:  OT General Charges $OT Visit: 1 Procedure OT Evaluation $OT Eval Moderate Complexity: 1 Procedure OT Treatments $Self Care/Home Management : 8-22 mins G-Codes:    Boone Master B Jan 03, 2017, 9:07 AM   Mateo Flow   OTR/L Pager: [phone] Office: [phone] .   Mateo Flow   OTR/L Pager: [phone] Office: [phone] .

## 2016-12-31 NOTE — Evaluation (Signed)
Physical Therapy Evaluation Patient Details Name: Nicholas Mejia MRN: [id number] DOB: 10-10-1971 Today's Date: 12/31/2016   History of Present Illness  46 yo male admitted with L facial numbness, HA and gait instability. 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  PMH positive for DM, HTN, and tobacco use.   Clinical Impression  Patient presents with decreased balance, upright orientation and decreased safety awareness.  Feel he will benefit from skilled PT in the acute setting to allow d/c home with family support and follow up outpatient neurorehabilitation.  Currently at risk for falls, but improving during session.  Hopes to be able to mobilize with cane at home (but depending on safety may need bariatric RW).      Follow Up Recommendations Outpatient PT (neuro rehabilitation)    Equipment Recommendations  Cane (TBA next session)    Recommendations for Other Services       Precautions / Restrictions Precautions Precautions: Fall Precaution Comments: imbalance to L; watch BP      Mobility  Bed Mobility Overal bed mobility: Independent             General bed mobility comments: up walking in hallway with OT  Transfers Overall transfer level: Needs assistance Equipment used: Rolling walker (2 wheeled) Transfers: Sit to/from Stand Sit to Stand: Supervision         General transfer comment: cues for safety stand to sit into recliner  Ambulation/Gait Ambulation/Gait assistance: Min assist;Min guard Ambulation Distance (Feet): 250 Feet Assistive device: Rolling walker (2 wheeled);None (wall railing) Gait Pattern/deviations: Step-through pattern;Drifts right/left;Narrow base of support;Wide base of support;Decreased stance time - left     General Gait Details: initially with RW, but kept BOS narrow, with wall railing on R tendency to drift away from wall, cues for forward gaze for orientation, wider BOS, able to walk with  min to minguard no device occasionally veering L.   Stairs Stairs: Yes Stairs assistance: Min guard Stair Management: One rail Right;Alternating pattern;Step to pattern;Sideways;Forwards Number of Stairs: 10 General stair comments: initially cued to used railing with both hands and pt self selected step through technique, to descent on hand on R railing and cues for step to sequence; minguard for balance/safety  Wheelchair Mobility    Modified Rankin (Stroke Patients Only) Modified Rankin (Stroke Patients Only) Pre-Morbid Rankin Score: No symptoms Modified Rankin: Moderately severe disability     Balance Overall balance assessment: Needs assistance Sitting-balance support: No upper extremity supported;Feet supported Sitting balance-Leahy Scale: Good   Postural control: Left lateral lean Standing balance support: No upper extremity supported Standing balance-Leahy Scale: Fair Standing balance comment: initially LOB to L, wide stance no LOB in static standing               High Level Balance Comments: eyes closed feet shoulder width x 30 sec no LOB, SLS on R > 3sec, on L with minguard for safaety 10 sec, able to pick up item from floor with S, turns 360 degrees to R and L slowly with S. Tandem gait with rail and min A             Pertinent Vitals/Pain Pain Assessment: No/denies pain    Home Living Family/patient expects to be discharged to:: Private residence Living Arrangements: Alone Available Help at Discharge: Friend(s);Available PRN/intermittently Type of Home: Apartment Home Access: Stairs to enter Entrance Stairs-Rails: Right Entrance Stairs-Number of Steps: flight Home Layout: One level Home Equipment: None Additional Comments: states mom can come from [place] to  stay temporarily    Prior Function Level of Independence: Independent         Comments: 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    Dominant Hand: Right    Extremity/Trunk Assessment   Upper Extremity Assessment Upper Extremity Assessment: Defer to OT evaluation    Lower Extremity Assessment Lower Extremity Assessment: Overall WFL for tasks assessed    Cervical / Trunk Assessment Cervical / Trunk Assessment: Normal  Communication   Communication: No difficulties  Cognition Arousal/Alertness: Awake/alert Behavior During Therapy: WFL for tasks assessed/performed Overall Cognitive Status: Impaired/Different from baseline Area of Impairment: Safety/judgement         Safety/Judgement: Decreased awareness of deficits     General Comments: Pt reports "i fall to the Left" but pt feels that tennis shoes and stretching L will help resolve deficit. 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)    Exercises     Assessment/Plan    PT Assessment Patient needs continued PT services  PT Problem List Decreased balance;Impaired sensation;Decreased coordination;Decreased mobility;Decreased safety awareness;Decreased knowledge of precautions          PT Treatment Interventions DME instruction;Gait training;Stair training;Functional mobility training;Balance training;Therapeutic exercise;Patient/family education;Therapeutic activities;Neuromuscular re-education    PT Goals (Current goals can be found in the Care Plan section)  Acute Rehab PT Goals Patient Stated Goal: to return home - "i got to get up out of here" PT Goal Formulation: With patient Time For Goal Achievement: 01/07/17 Potential to Achieve Goals: Good    Frequency Min 4X/week   Barriers to discharge Inaccessible home environment;Decreased caregiver support hopes mom can come stay temporarily    Co-evaluation                End of Session Equipment Utilized During Treatment: Gait belt Activity Tolerance: Patient tolerated treatment well Patient left: in chair;with call bell/phone within reach           Time: 0852-0917 PT Time Calculation (min) (ACUTE ONLY): 25 min   Charges:   PT Evaluation $PT Eval Moderate Complexity: 1 Procedure PT Treatments $Gait Training: 8-22 mins   PT G CodesElray Mejia 07-Jan-2017, 10:09 AM  Nicholas Mejia, PT [phone] 01-07-17

## 2016-12-31 NOTE — Progress Notes (Addendum)
Inpatient Diabetes Program Recommendations  AACE/ADA: New Consensus Statement on Inpatient Glycemic Control (2015)  Target Ranges:  Prepandial:   less than 140 mg/dL      Peak postprandial:   less than 180 mg/dL (1-2 hours)      Critically ill patients:  140 - 180 mg/dL   Review of Glycemic Control  Diabetes history: DM 2 (Sees Dr. Tracey Harriesavid Bouska) Outpatient Diabetes medications: Metformin 500 mg Daily, Amaryl 1 mg Daily (per PCP office visit note yesterday 2/6) Current orders for Inpatient glycemic control: Lantus 20 units, Novolog Sensitive TID  Inpatient Diabetes Program Recommendations:   Patient received 20 units of Lantus and will get another 10 units. Has not been on insulin before. Agree with Lantus 30 units total daily dose for now (0.2 units/kg). A1c on 09/30/16 per care everywhere was 7.9% on Metformin and Amaryl. A1c pending this admission. Consider also Novolog HS scale. Should see a decrease in trends 2-3 hours basal insulin is given. Will watch patient while here.  Thanks,  Christena DeemShannon Dilyn Osoria RN, MSN, [facility] Inpatient Diabetes Coordinator Team Pager [phone] (8a-5p)

## 2016-12-31 NOTE — Progress Notes (Addendum)
Patient seen and examined   46 y.o. male with medical history significant of hypertension, diabetes mellitus, formal smoker (quit 6 weeks ago), who presents with left facial numbness, headache and gait instability.  Pt states that his symptoms started at about 11 AM, including left facial numbness, headache and gait instability. He does not have unilateral weakness,  slurred speech, facial droop, vision change or hearing loss. 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 Nonspecific T2 FLAIR hyperintense white matter foci unexpected for age probably represent microvascular ischemic changes particularly in the setting of diabetes, less likely migraine headache or sequelae of demyelination, vasculitis, and other infectious/inflammatory processes.    CTA  HEAD AND NECK REVIEWED   Assessment/Plan Stroke (cerebrum) [facility](HCC): MRI showed subcentimeter acute/early subacute infarction within the left posterolateral medulla. Neurology, Dr .Otelia LimesLindzen was consulted. 1. Frequent neuro checks 2. TTE.  4. PT consult, OT consult, Speech consult-recommend outpatient therapy 6. Risk factor modification 7. Telemetry monitoring 8. HgbA1c, fasting lipid panel 9. Continue ASA and atorvastatin 10. BP management - 2D transthoracic echocardiography  - Check UDS  - PT/OT consult May need loop recorder as per Dr.sethi  HTN: - will hold Bp med (chlorthalidone) to allow permissive HTN in the setting of acute stroke   DM-II: Last A1 not on record. Patient is taking metformin. at home. Blood sugar 415. Started on Lantus 30 units, will need diabetes coordinator to teach insulin administration, Accu-Chek monitoring -SSI -Check A1c  AKI: cre 1.34 Likely due to prerenal secondary to dehydration and continuation of diruetics. - IVF: 1L NS, then 100 cc/h - US-renal - Follow up renal function by BMP - Hold Diuretics  Hx of Tobacco abuse: pt quit smoking 6 weeks ago -Did counseling about importance of  not to restart smoking -refused Nicotine patch

## 2016-12-31 NOTE — Progress Notes (Signed)
Patient complaining of nausea and cramping in lower abdomen.  Repositioned patient on his right side and encouraged him to pass flatus.  Patient was able to release gas and states he feels much better.  Will continue to monitor. Lawson RadarHeather M Obbie Lewallen

## 2016-12-31 NOTE — H&P (Signed)
History and Physical    Nicholas Mejia JXB:[id number] DOB: September 12, 1971 DOA: 12/30/2016  Referring MD/NP/PA:   PCP: Aura DialsBOUSKA,DAVID E, MD   Patient coming from:  The patient is coming from home.  At baseline, pt is independent for most of ADL.  Chief Complaint: Left facial numbness, headache and gait instability  HPI: Nicholas Mejia is a 46 y.o. male with medical history significant of hypertension, diabetes mellitus, formal smoker (quit 6 weeks ago), who presents with left facial numbness, headache and gait instability.  Pt states that his symptoms started at about 11 AM, including left facial numbness, headache and gait instability. He does not have unilateral weakness, slurred speech, facial droop, vision change or hearing loss. He has mild nausea, but no vomiting, diarrhea, abdominal pain, symptoms of UTI. No fever or chills. Patient does not have chest pain, shortness breath, cough, flu symptoms. Pt states that he quit smoking 6 weeks ago.  ED Course: pt was found to have WBC 12.2, INR 0.98, acute renal injury with creatinine 1.34, negative CT head for acute intracranial abnormalities, temperature normal, blood pressure elevated at 203/98-->183/82. Pt is admitted to tele bed as inpt. Neurology, Dr. Otelia LimesLindzen was consulted.  # MRI of the brain showed 1. Subcentimeter acute/early subacute infarction within the left posterolateral medulla. 2. Nonspecific T2 FLAIR hyperintense white matter foci unexpected for age probably represent microvascular ischemic changes particularly in the setting of diabetes, less likely migraine headache or sequelae of demyelination, vasculitis, and other infectious/inflammatory processes.  Review of Systems:   General: no fevers, chills, no changes in body weight HEENT: no blurry vision, hearing changes or sore throat Respiratory: no dyspnea, coughing, wheezing CV: no chest pain, no palpitations GI: has nausea, no vomiting, abdominal pain, diarrhea, constipation GU: no  dysuria, burning on urination, increased urinary frequency, hematuria  Ext: no leg edema Neuro: has left facial droop and gait instability. no vision change or hearing loss Skin: no rash, no skin tear. MSK: No muscle spasm, no deformity, no limitation of range of movement in spin Heme: No easy bruising.  Travel history: No recent long distant travel.  Allergy:  Allergies  Allergen Reactions  . Bee Venom Anaphylaxis and Swelling    Past Medical History:  Diagnosis Date  . Diabetes mellitus without complication [facility](HCC)     Past Surgical History:  Procedure Laterality Date  . EXTERNAL EAR SURGERY      Social History:  reports that he has quit smoking. He does not have any smokeless tobacco history on file. He reports that he drinks alcohol. He reports that he does not use drugs.  Family History:  Family History  Problem Relation Age of Onset  . Heart attack Father   . Gout Brother      Prior to Admission medications   Medication Sig Start Date End Date Taking? Authorizing Provider  aspirin EC 81 MG tablet Take 162 mg by mouth daily.   Yes Historical Provider, MD  chlorthalidone (HYGROTON) 25 MG tablet Take 25 mg by mouth daily.   Yes Historical Provider, MD  fluticasone (FLONASE) 50 MCG/ACT nasal spray Place 1 spray into both nostrils daily.   Yes Historical Provider, MD  fluticasone-salmeterol (ADVAIR HFA) 115-21 MCG/ACT inhaler Inhale 2 puffs into the lungs 2 (two) times daily as needed (shortness of breath).   Yes Historical Provider, MD  guaiFENesin-codeine 100-10 MG/5ML syrup Take 5 mLs by mouth 3 (three) times daily as needed for cough.   Yes Historical Provider, MD  metFORMIN (GLUCOPHAGE-XR) 500 MG  24 hr tablet Take 500 mg by mouth See admin instructions. Take 1 tablet every morning, takes another tablet in the evening if remembers   Yes Historical Provider, MD  montelukast (SINGULAIR) 10 MG tablet Take 10 mg by mouth at bedtime.   Yes Historical Provider, MD  Turmeric 500  MG TABS Take 1,000 mg by mouth 2 (two) times daily.   Yes Historical Provider, MD    Physical Exam: Vitals:   12/31/16 0240 12/31/16 0300 12/31/16 0500 12/31/16 0657  BP:  (!) 191/73 (!) 179/89 (!) 180/90  Pulse:  86 82 85  Resp:  20  20  Temp: 98.5 F (36.9 C) 97.6 F (36.4 C) 97.6 F (36.4 C) 97.8 F (36.6 C)  TempSrc: Oral Oral Oral Oral  SpO2:  96% 96% 96%  Weight:  (!) 147.9 kg (326 lb)    Height:  6\' 2"  (1.88 m)     General: Not in acute distress HEENT:       Eyes: PERRL, EOMI, no scleral icterus.       ENT: No discharge from the ears and nose, no pharynx injection, no tonsillar enlargement.        Neck: No JVD, no bruit, no mass felt. Heme: No neck lymph node enlargement. Cardiac: S1/S2, RRR, No murmurs, No gallops or rubs. Respiratory: No rales, wheezing, rhonchi or rubs. GI: Soft, nondistended, nontender, no rebound pain, no organomegaly, BS present. GU: No hematuria Ext: No pitting leg edema bilaterally. 2+DP/PT pulse bilaterally. Musculoskeletal: No joint deformities, No joint redness or warmth, no limitation of ROM in spin. Skin: No rashes.  Neuro: Alert, oriented X3, cranial nerves II-XII grossly intact, moves all extremities normally. Muscle strength 5/5 in all extremities, sensation to light touch intact. Brachial reflex 2+ bilaterally. Knee reflex 1+ bilaterally. Negative Babinski's sign. Normal finger to nose test. Psych: Patient is not psychotic, no suicidal or hemocidal ideation.  Labs on Admission: I have personally reviewed following labs and imaging studies  CBC:  Recent Labs Lab 12/30/16 2138 12/30/16 2156  WBC 12.2*  --   NEUTROABS 8.9*  --   HGB 15.8 17.3*  HCT 46.2 51.0  MCV 87.2  --   PLT 267  --    Basic Metabolic Panel:  Recent Labs Lab 12/30/16 2138 12/30/16 2156  NA 131* 134*  K 4.2 4.1  CL 89* 88*  CO2 32  --   GLUCOSE 415* 429*  BUN 20 23*  CREATININE 1.34* 1.30*  CALCIUM 8.9  --    GFR: Estimated Creatinine Clearance:  107.8 mL/min (by C-G formula based on SCr of 1.3 mg/dL (H)). Liver Function Tests:  Recent Labs Lab 12/30/16 2138  AST 21  ALT 16*  ALKPHOS 53  BILITOT 0.4  PROT 7.4  ALBUMIN 3.5   No results for input(s): LIPASE, AMYLASE in the last 168 hours. No results for input(s): AMMONIA in the last 168 hours. Coagulation Profile:  Recent Labs Lab 12/30/16 2138  INR 0.98   Cardiac Enzymes:  Recent Labs Lab 12/31/16 0255  TROPONINI 0.03*   BNP (last 3 results) No results for input(s): PROBNP in the last 8760 hours. HbA1C: No results for input(s): HGBA1C in the last 72 hours. CBG:  Recent Labs Lab 12/30/16 2256 12/31/16 0626  GLUCAP 453* 398*   Lipid Profile: No results for input(s): CHOL, HDL, LDLCALC, TRIG, CHOLHDL, LDLDIRECT in the last 72 hours. Thyroid Function Tests: No results for input(s): TSH, T4TOTAL, FREET4, T3FREE, THYROIDAB in the last 72 hours. Anemia Panel:  No results for input(s): VITAMINB12, FOLATE, FERRITIN, TIBC, IRON, RETICCTPCT in the last 72 hours. Urine analysis:    Component Value Date/Time   COLORURINE YELLOW 12/30/2016 2146   APPEARANCEUR CLEAR 12/30/2016 2146   LABSPEC 1.028 12/30/2016 2146   PHURINE 5.0 12/30/2016 2146   GLUCOSEU >=500 (A) 12/30/2016 2146   HGBUR SMALL (A) 12/30/2016 2146   BILIRUBINUR NEGATIVE 12/30/2016 2146   KETONESUR NEGATIVE 12/30/2016 2146   PROTEINUR 30 (A) 12/30/2016 2146   NITRITE NEGATIVE 12/30/2016 2146   LEUKOCYTESUR NEGATIVE 12/30/2016 2146   Sepsis Labs: @LABRCNTIP (procalcitonin:4,lacticidven:4) )No results found for this or any previous visit (from the past 240 hour(s)).   Radiological Exams on Admission: Ct Head Wo Contrast  Result Date: 12/30/2016 CLINICAL DATA:  Left-sided numbness starting at 11 a.m. History of hypertension. EXAM: CT HEAD WITHOUT CONTRAST TECHNIQUE: Contiguous axial images were obtained from the base of the skull through the vertex without intravenous contrast. COMPARISON:  None.  FINDINGS: Brain: No evidence of acute infarction, hemorrhage, hydrocephalus, extra-axial collection or mass lesion/mass effect. Vascular: Vascular calcifications are present. Skull: Normal. Negative for fracture or focal lesion. Sinuses/Orbits: No acute finding. Other: None. IMPRESSION: No acute intracranial abnormalities. Electronically Signed   By: Burman Nieves M.D.   On: 12/30/2016 22:25   Mr Brain Wo Contrast  Result Date: 12/31/2016 CLINICAL DATA:  46 y/o M; left-sided facial numbness, headache, pressure behind the left eye, and weakness in the legs. EXAM: MRI HEAD WITHOUT CONTRAST TECHNIQUE: Multiplanar, multiecho pulse sequences of the brain and surrounding structures were obtained without intravenous contrast. COMPARISON:  12/30/2016 CT head. FINDINGS: Brain: Subcentimeter focus of diffusion restriction within the left posterolateral medulla. Scattered foci of T2 FLAIR hyperintensity in subcortical and periventricular white matter are nonspecific. Mild diffuse brain parenchymal volume loss. No abnormal susceptibility hypointensity to indicate intracranial hemorrhage. No focal mass effect, hydrocephalus, or extra-axial collection. Vascular: Normal flow voids. Skull and upper cervical spine: Normal marrow signal. Sinuses/Orbits: Negative.  Underpneumatized frontal sinuses. Other: None. IMPRESSION: 1. Subcentimeter acute/early subacute infarction within the left posterolateral medulla. 2. Nonspecific T2 FLAIR hyperintense white matter foci unexpected for age probably represent microvascular ischemic changes particularly in the setting of diabetes, less likely migraine headache or sequelae of demyelination, vasculitis, and other infectious/inflammatory processes. These results were called by telephone at the time of interpretation on 12/31/2016 at 12:52 am to Dr. Ross Marcus , who verbally acknowledged these results. Electronically Signed   By: Mitzi Hansen M.D.   On: 12/31/2016 00:53      EKG: Independently reviewed. Sinus rhythm, QTC 448, ST depression in inferior leads, ST elevation in V1-V3. Per Dr. Wilkie Aye, EKG finding was discussed with cardiologist by Dr. Clayborne Dana. Since pt dose not have any chest pain, cardiologist did not think the patient has STEMI    Assessment/Plan Principal Problem:   Stroke (cerebrum) (HCC) Active Problems:   Diabetes mellitus without complication (HCC)   AKI (acute kidney injury) (HCC)   Leukocytosis   Essential hypertension   Stroke (cerebrum) (HCC): MRI showed subcentimeter acute/early subacute infarction within the left posterolateral medulla. Neurology, Dr .Otelia Limes was consulted.   -will admit to tele bed  - will follow up Neurology's Recs.  - Obtain CT angiogram of head and neck -start lipitor - Continue ASA, but will increase dose from 81 to 300 or 325 mg daily - fasting lipid panel and HbA1c  - 2D transthoracic echocardiography  - Check UDS  - PT/OT consult  HTN: - will hold Bp med (chlorthalidone) to allow  permissive HTN in the setting of acute stroke   DM-II: Last A1 not on record. Patient is taking metformin. at home. Blood sugar 4:15. -will start Lantus, 3 units daily   -SSI -Check A1c  AKI: cre 1.34 Likely due to prerenal secondary to dehydration and continuation of diruetics. - IVF: 1L NS, then 100 cc/h - US-renal - Follow up renal function by BMP - Hold Diuretics  Hx of Tobacco abuse: pt quit smoking 6 weeks ago -Did counseling about importance of not to restart smoking -refused Nicotine patch     DVT ppx: SQ Lovenox Code Status: Full code Family Communication: None at bed side.    Disposition Plan:  Anticipate discharge back to previous home environment Consults called: Neurology, Dr. Otelia Limes Admission status: Inpatient/tele   Date of Service 12/31/2016    Lorretta Harp [facility] Pager [phone]  If 7PM-7AM, please contact night-coverage [email or web address] Password [facility] 12/31/2016, 7:43  AM

## 2016-12-31 NOTE — Progress Notes (Unsigned)
Reviewed patient for Inclusion and exclusion for [facility] study at request of Dr. Pearlean BrownieSethi.. Patient is excluded due to age.

## 2016-12-31 NOTE — Consult Note (Signed)
Referring Physician: Dr. 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 male who presented to the [facility] ED with c/c of left sided facial numbness beginning about 11 AM on 2/5. He also had a headache involving his left periorbital and retro-orbital region. He was seen for this, receiving Toradol and an unknown BP medication. Soon thereafter he began experiencing numbness of the left side of his face. He went home, slept, and on awakening he experienced severe gait instability and left sided incoordination. Also felt as though he was "drifting to the left" while ambulating. He states that he has slight difficulty swallowing with a sensation that swallowed material does not move as well in the left side of his throat relative to the right when he swallows. He recently finished a course of antibiotic for pneumonia. He has a PMHx of DM. He denies prior stroke or MI.   ROS negative for associated neck pain. Positive for chronically decreased visual acuity in left eye and a "lazy eye" (left). Denies chest pain, SOB, abdominal pain or limb pain.   Past Medical History:  Diagnosis Date  . Diabetes mellitus without complication ([place])    History reviewed. No pertinent surgical history.  No family history on file. Social History:  reports that he has been smoking.  He does not have any smokeless tobacco history on file. He reports that he drinks alcohol. He reports that he does not use drugs.  Allergies:  Allergies  Allergen Reactions  . Bee Venom Anaphylaxis and Swelling    Medications:  Scheduled: . aspirin  300 mg Rectal Daily   Or  . aspirin  325 mg Oral Daily  . atorvastatin  40 mg Oral q1800  . enoxaparin (LOVENOX) injection  40 mg Subcutaneous Q24H  . insulin aspart  0-9 Units Subcutaneous TID WC  . insulin glargine  3 Units Subcutaneous Daily   Physical Examination: Blood pressure 170/85, pulse 76, temperature 98.5 F (36.9  C), temperature source Oral, resp. rate 17, height 6' 2"  (1.88 m), weight (!) 145.2 kg (320 lb), SpO2 98 %.  HEENT: [place]/AT Lungs: Respirations unlabored. No gross wheezing.  Ext: No edema.   Neurologic Examination: Ment: Alert and fully oriented. Speech fluent with intact comprehension.  CN: Left pupil slightly smaller than right and reactive at 2 >> 1.5 mm. Right pupil reactive at 3 >> 2 mm. Visual fields intact. Subtle left ptosis. Exotropia with EOM full in both eyes but slower with left globe than right. Decreased temperature and fine touch sensation to trigeminal nerve distribution on left. No facial droop. Hearing intact to conversation. Palate elevates normally. Uvula is midline. Phonation intact. XI - symmetric. [place] protrudes midline.  Motor: 5/5 x 4 proximally and distally.  Sensory: Decreased FT and temp RUE and RLE. Normal on left.  Reflexes: 2+ upper and lower extremities without asymmetry. Toes downgoing bilaterally.  Cerebellar: Multiple repetitions reveals no deficit to RAM or FNF on right or left. Heel shin also intact bilaterally.  Gait: Unstable upon standing. Deferred testing of ambulation.   Results for orders placed or performed during the hospital encounter of 12/30/16 (from the past 48 hour(s))  Protime-INR     Status: None   Collection Time: 12/30/16  9:38 PM  Result Value Ref Range   Prothrombin Time 13.0 11.4 - 15.2 seconds   INR 0.98   APTT     Status: None   Collection Time: 12/30/16  9:38 PM  Result Value Ref Range   aPTT 30 24 - 36 seconds  CBC     Status: Abnormal   Collection Time: 12/30/16  9:38 PM  Result Value Ref Range   WBC 12.2 (H) 4.0 - 10.5 K/uL   RBC 5.30 4.22 - 5.81 MIL/uL   Hemoglobin 15.8 13.0 - 17.0 g/dL   HCT 46.2 39.0 - 52.0 %   MCV 87.2 78.0 - 100.0 fL   MCH 29.8 26.0 - 34.0 pg   MCHC 34.2 30.0 - 36.0 g/dL   RDW 13.1 11.5 - 15.5 %   Platelets 267 150 - 400 K/uL  Differential     Status: Abnormal   Collection Time: 12/30/16  9:38  PM  Result Value Ref Range   Neutrophils Relative % 73 %   Neutro Abs 8.9 (H) 1.7 - 7.7 K/uL   Lymphocytes Relative 21 %   Lymphs Abs 2.5 0.7 - 4.0 K/uL   Monocytes Relative 5 %   Monocytes Absolute 0.6 0.1 - 1.0 K/uL   Eosinophils Relative 1 %   Eosinophils Absolute 0.1 0.0 - 0.7 K/uL   Basophils Relative 0 %   Basophils Absolute 0.0 0.0 - 0.1 K/uL  Comprehensive metabolic panel     Status: Abnormal   Collection Time: 12/30/16  9:38 PM  Result Value Ref Range   Sodium 131 (L) 135 - 145 mmol/L   Potassium 4.2 3.5 - 5.1 mmol/L   Chloride 89 (L) 101 - 111 mmol/L   CO2 32 22 - 32 mmol/L   Glucose, Bld 415 (H) 65 - 99 mg/dL   BUN 20 6 - 20 mg/dL   Creatinine, Ser 1.34 (H) 0.61 - 1.24 mg/dL   Calcium 8.9 8.9 - 10.3 mg/dL   Total Protein 7.4 6.5 - 8.1 g/dL   Albumin 3.5 3.5 - 5.0 g/dL   AST 21 15 - 41 U/L   ALT 16 (L) 17 - 63 U/L   Alkaline Phosphatase 53 38 - 126 U/L   Total Bilirubin 0.4 0.3 - 1.2 mg/dL   GFR calc non Af Amer >60 >60 mL/min   GFR calc Af Amer >60 >60 mL/min    Comment: (NOTE) The eGFR has been calculated using the CKD EPI equation. This calculation has not been validated in all clinical situations. eGFR's persistently <60 mL/min signify possible Chronic Kidney Disease.    Anion gap 10 5 - 15  Urinalysis, Routine w reflex microscopic     Status: Abnormal   Collection Time: 12/30/16  9:46 PM  Result Value Ref Range   Color, Urine YELLOW YELLOW   APPearance CLEAR CLEAR   Specific Gravity, Urine 1.028 1.005 - 1.030   pH 5.0 5.0 - 8.0   Glucose, UA >=500 (A) NEGATIVE mg/dL   Hgb urine dipstick SMALL (A) NEGATIVE   Bilirubin Urine NEGATIVE NEGATIVE   Ketones, ur NEGATIVE NEGATIVE mg/dL   Protein, ur 30 (A) NEGATIVE mg/dL   Nitrite NEGATIVE NEGATIVE   Leukocytes, UA NEGATIVE NEGATIVE   RBC / HPF 0-5 0 - 5 RBC/hpf   WBC, UA 0-5 0 - 5 WBC/hpf   Bacteria, UA RARE (A) NONE SEEN   Squamous Epithelial / LPF NONE SEEN NONE SEEN   Hyaline Casts, UA PRESENT    I-stat troponin, ED     Status: None   Collection Time: 12/30/16  9:55 PM  Result Value Ref Range   Troponin i, poc 0.00 0.00 - 0.08 ng/mL   Comment 3  Comment: Due to the release kinetics of cTnI, a negative result within the first hours of the onset of symptoms does not rule out myocardial infarction with certainty. If myocardial infarction is still suspected, repeat the test at appropriate intervals.   I-Stat Chem 8, ED     Status: Abnormal   Collection Time: 12/30/16  9:56 PM  Result Value Ref Range   Sodium 134 (L) 135 - 145 mmol/L   Potassium 4.1 3.5 - 5.1 mmol/L   Chloride 88 (L) 101 - 111 mmol/L   BUN 23 (H) 6 - 20 mg/dL   Creatinine, Ser 1.30 (H) 0.61 - 1.24 mg/dL   Glucose, Bld 429 (H) 65 - 99 mg/dL   Calcium, Ion 1.15 1.15 - 1.40 mmol/L   TCO2 33 0 - 100 mmol/L   Hemoglobin 17.3 (H) 13.0 - 17.0 g/dL   HCT 51.0 39.0 - 52.0 %  CBG monitoring, ED     Status: Abnormal   Collection Time: 12/30/16 10:56 PM  Result Value Ref Range   Glucose-Capillary 453 (H) 65 - 99 mg/dL   Ct Head Wo Contrast  Result Date: 12/30/2016 CLINICAL DATA:  Left-sided numbness starting at 11 a.m. History of hypertension. EXAM: CT HEAD WITHOUT CONTRAST TECHNIQUE: Contiguous axial images were obtained from the base of the skull through the vertex without intravenous contrast. COMPARISON:  None. FINDINGS: Brain: No evidence of acute infarction, hemorrhage, hydrocephalus, extra-axial collection or mass lesion/mass effect. Vascular: Vascular calcifications are present. Skull: Normal. Negative for fracture or focal lesion. Sinuses/Orbits: No acute finding. Other: None. IMPRESSION: No acute intracranial abnormalities. Electronically Signed   By: Lucienne Capers M.D.   On: 12/30/2016 22:25   Mr Brain Wo Contrast  Result Date: 12/31/2016 CLINICAL DATA:  46 y/o M; left-sided facial numbness, headache, pressure behind the left eye, and weakness in the legs. EXAM: MRI HEAD WITHOUT CONTRAST TECHNIQUE:  Multiplanar, multiecho pulse sequences of the brain and surrounding structures were obtained without intravenous contrast. COMPARISON:  12/30/2016 CT head. FINDINGS: Brain: Subcentimeter focus of diffusion restriction within the left posterolateral medulla. Scattered foci of T2 FLAIR hyperintensity in subcortical and periventricular white matter are nonspecific. Mild diffuse brain parenchymal volume loss. No abnormal susceptibility hypointensity to indicate intracranial hemorrhage. No focal mass effect, hydrocephalus, or extra-axial collection. Vascular: Normal flow voids. Skull and upper cervical spine: Normal marrow signal. Sinuses/Orbits: Negative.  Underpneumatized frontal sinuses. Other: None. IMPRESSION: 1. Subcentimeter acute/early subacute infarction within the left posterolateral medulla. 2. Nonspecific T2 FLAIR hyperintense white matter foci unexpected for age probably represent microvascular ischemic changes particularly in the setting of diabetes, less likely migraine headache or sequelae of demyelination, vasculitis, and other infectious/inflammatory processes. These results were called by telephone at the time of interpretation on 12/31/2016 at 12:52 am to Dr. Thayer Jew , who verbally acknowledged these results. Electronically Signed   By: Kristine Garbe M.D.   On: 12/31/2016 00:53    Assessment: 46 y.o. 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  1. MRI brain reveals subcentimeter acute/early subacute infarction within the left posterolateral medulla.  2. Also noted on MRI is nonspecific T2 FLAIR hyperintense white matter foci unexpected for age probably representing microvascular ischemic changes particularly in the setting of diabetes, less likely migraine headache or sequelae of demyelination, vasculitis, and other infectious/inflammatory processes.  3. Out of IV tPA and endovascular time windows.  4. Denies neck pain or  recent sudden hyperextension/flexion/rotation of neck.  5. Stroke Risk Factors -  DM.   Plan: 1. Frequent neuro checks 2. CT angiogram of head in progress.  3. TTE.  4. Carotid ultrasound.  5. PT consult, OT consult, Speech consult 6. Risk factor modification 7. Telemetry monitoring 8. HgbA1c, fasting lipid panel 9. Continue ASA and atorvastatin 10. BP management  @Electronically  signed: Dr. Kerney Elbe  12/31/2016, 1:47 AM

## 2016-12-31 NOTE — Progress Notes (Signed)
  Echocardiogram 2D Echocardiogram has been performed.  Janalyn HarderWest, Kaylean Tupou R 12/31/2016, 3:49 PM

## 2016-12-31 NOTE — Progress Notes (Signed)
STROKE TEAM PROGRESS NOTE   HISTORY OF PRESENT ILLNESS (per record) Nicholas Cookis an 46 y.o.malewho presented to the [facility] ED with c/c of left sided facial numbness beginning about 11 AM on 2/5 (LKW). He also had a headache involving his left periorbital and retro-orbital region. He was seen for this, receiving Toradol and an unknown BP medication. Soon thereafter he began experiencing numbness of the left side of his face. He went home, slept, and on awakening he experienced severe gait instability and left sided incoordination. Also felt as though he was "drifting to the left" while ambulating. He states that he has slight difficulty swallowing with a sensation that swallowed material does not move as well in the left side of his throat relative to the right when he swallows. He recently finished a course of antibiotic for pneumonia. He has a PMHx of DM. He denies prior stroke or MI. Patient was not administered IV t-PA secondary to delay in arrival. He was admitted to the for further evaluation and treatment.   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 He states his facial numbness is improving but gait ataxia persists   OBJECTIVE Temp:  [97.6 F (36.4 C)-98.5 F (36.9 C)] 97.9 F (36.6 C) (02/07 1010) Pulse Rate:  [76-87] 84 (02/07 1010) Cardiac Rhythm: Normal sinus rhythm (02/07 1011) Resp:  [16-20] 20 (02/07 1010) BP: (160-203)/(73-98) 167/78 (02/07 1010) SpO2:  [96 %-100 %] 97 % (02/07 1010) Weight:  [145.2 kg (320 lb)-147.9 kg (326 lb)] 147.9 kg (326 lb) (02/07 0300)  CBC:  Recent Labs Lab 12/30/16 2138 12/30/16 2156  WBC 12.2*  --   NEUTROABS 8.9*  --   HGB 15.8 17.3*  HCT 46.2 51.0  MCV 87.2  --   PLT 267  --     Basic Metabolic Panel:  Recent Labs Lab 12/30/16 2138  12/30/16 2156  NA 131* 134*  K 4.2 4.1  CL 89* 88*  CO2 32  --   GLUCOSE 415* 429*  BUN 20 23*  CREATININE 1.34* 1.30*  CALCIUM 8.9  --     Lipid Panel: No results found for: CHOL, TRIG, HDL, CHOLHDL, VLDL, LDLCALC HgbA1c: No results found for: HGBA1C Urine Drug Screen:    Component Value Date/Time   LABOPIA NONE DETECTED 12/30/2016 2146   COCAINSCRNUR NONE DETECTED 12/30/2016 2146   LABBENZ NONE DETECTED 12/30/2016 2146   AMPHETMU NONE DETECTED 12/30/2016 2146   THCU NONE DETECTED 12/30/2016 2146   LABBARB NONE DETECTED 12/30/2016 2146      IMAGING  Ct Angio Head W/cm &/or Wo Cm  Result Date: 12/31/2016 CLINICAL DATA:  Left facial numbness.  Left medulla infarction. EXAM: CT ANGIOGRAPHY HEAD AND NECK TECHNIQUE: Multidetector CT imaging of the head and neck was performed using the standard protocol during bolus administration of intravenous contrast. Multiplanar CT image reconstructions and MIPs were obtained to evaluate the vascular anatomy. Carotid stenosis measurements (when applicable) are obtained utilizing NASCET criteria, using the distal internal carotid diameter as the denominator. CONTRAST:  50 cc Isovue 370 COMPARISON:  MRI same day FINDINGS: CTA NECK FINDINGS Aortic arch: No atherosclerotic change, aneurysm or dissection. Right carotid system: Common carotid artery shows some circumferential wall thickening with minimal luminal diameter of 5 mm compared to an expected diameter of 8 mm. Carotid bifurcation shows atherosclerotic disease with a small  region of calcified plaque and circumferential wall thickening. Minimal diameter of the ICA is 4 mm. Compared to a more distal cervical ICA diameter of 4.5 mm, this indicates a 15-20% stenosis. Cervical ICA beyond that is widely patent. Left carotid system: Common carotid artery shows a normal caliber throughout. Mild atherosclerotic change at the carotid bifurcation with a punctate calcification and mild wall thickening. No  narrowing of the ICA relative to the more distal cervical ICA. Vertebral arteries: Right vertebral artery origin shows calcified plaque with stenosis estimated at 70% or greater. Beyond that, the right vertebral artery is widely patent through the cervical region. Left vertebral artery origin shows 50% stenosis. Beyond that, the vessel is widely patent through the cervical region. Skeleton: Ordinary mild lower cervical spondylosis. Other neck: No mass or lymphadenopathy. Upper chest: Normal Review of the MIP images confirms the above findings CTA HEAD FINDINGS Anterior circulation: Both internal carotid arteries are patent through the siphon regions. There is atherosclerosis throughout the siphon regions with extensive calcification. Stenosis at the proximal siphon on the right shows a diameter of 2 mm diameter in the mid siphon on the left is also 2 mm. Anterior and middle cerebral vessels are patent bilaterally with atherosclerotic irregularity. 50% stenoses at both M1 origins. Posterior circulation: Both vertebral arteries are patent at the foramen magnum. Both posterior inferior cerebellar arteries show flow. There is severe stenosis of the left vertebral artery beyond PICA, but the vessel does show patency to the basilar. There is moderate stenosis of the distal right vertebral artery just proximal to the basilar. The basilar artery shows mild atherosclerotic irregularity but no focal stenosis. Superior cerebellar and posterior cerebral arteries are patent. Both anterior and posterior circulation distal branch vessels show atherosclerotic irregularity. Venous sinuses: Patent and normal Anatomic variants: None significant Delayed phase: No abnormal enhancement. Review of the MIP images confirms the above findings IMPRESSION: In this patient with an acute right medulla infarction, there is calcified plaque at the right vertebral artery origin with 70% stenosis. There is noncalcified plaque at the left vertebral  artery origin with 50% stenosis. There is atherosclerotic disease affecting the proximal intracranial vertebral arteries with severe stenosis on the left beyond PICA, 70% or greater. There is stenosis of the distal right vertebral artery just proximal to the basilar estimated at 50%. Atherosclerotic change affecting both common carotid arteries and carotid bifurcations but without flow limiting stenosis. 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 Diameter as narrow as 2 mm on each side. 50% stenoses at both M1 origins. Distal intra cranial vessel atherosclerotic change diffusely. Electronically Signed   By: Paulina Fusi M.D.   On: 12/31/2016 08:20   Dg Chest 2 View  Result Date: 12/31/2016 CLINICAL DATA:  Fever EXAM: CHEST  2 VIEW COMPARISON:  None. FINDINGS: Heart is borderline in size. Lingular atelectasis or scarring. Right lung is clear. No effusions or acute bony abnormality. IMPRESSION: Lingular atelectasis or scarring. Electronically Signed   By: Charlett Nose M.D.   On: 12/31/2016 13:48   Ct Head Wo Contrast  Result Date: 12/30/2016 CLINICAL DATA:  Left-sided numbness starting at 11 a.m. History of hypertension. EXAM: CT HEAD WITHOUT CONTRAST TECHNIQUE: Contiguous axial images were obtained from the base of the skull through the vertex without intravenous contrast. COMPARISON:  None. FINDINGS: Brain: No evidence of acute infarction, hemorrhage, hydrocephalus, extra-axial collection or mass lesion/mass effect. Vascular: Vascular calcifications are present. Skull: Normal. Negative for fracture or focal lesion. Sinuses/Orbits: No acute finding. Other: None.  IMPRESSION: No acute intracranial abnormalities. Electronically Signed   By: Burman NievesWilliam  Stevens M.D.   On: 12/30/2016 22:25   Ct Angio Neck W/cm &/or Wo/cm  Result Date: 12/31/2016 CLINICAL DATA:  Left facial numbness.  Left medulla infarction. EXAM: CT ANGIOGRAPHY HEAD AND NECK TECHNIQUE: Multidetector CT imaging of the head and  neck was performed using the standard protocol during bolus administration of intravenous contrast. Multiplanar CT image reconstructions and MIPs were obtained to evaluate the vascular anatomy. Carotid stenosis measurements (when applicable) are obtained utilizing NASCET criteria, using the distal internal carotid diameter as the denominator. CONTRAST:  50 cc Isovue 370 COMPARISON:  MRI same day FINDINGS: CTA NECK FINDINGS Aortic arch: No atherosclerotic change, aneurysm or dissection. Right carotid system: Common carotid artery shows some circumferential wall thickening with minimal luminal diameter of 5 mm compared to an expected diameter of 8 mm. Carotid bifurcation shows atherosclerotic disease with a small region of calcified plaque and circumferential wall thickening. Minimal diameter of the ICA is 4 mm. Compared to a more distal cervical ICA diameter of 4.5 mm, this indicates a 15-20% stenosis. Cervical ICA beyond that is widely patent. Left carotid system: Common carotid artery shows a normal caliber throughout. Mild atherosclerotic change at the carotid bifurcation with a punctate calcification and mild wall thickening. No narrowing of the ICA relative to the more distal cervical ICA. Vertebral arteries: Right vertebral artery origin shows calcified plaque with stenosis estimated at 70% or greater. Beyond that, the right vertebral artery is widely patent through the cervical region. Left vertebral artery origin shows 50% stenosis. Beyond that, the vessel is widely patent through the cervical region. Skeleton: Ordinary mild lower cervical spondylosis. Other neck: No mass or lymphadenopathy. Upper chest: Normal Review of the MIP images confirms the above findings CTA HEAD FINDINGS Anterior circulation: Both internal carotid arteries are patent through the siphon regions. There is atherosclerosis throughout the siphon regions with extensive calcification. Stenosis at the proximal siphon on the right shows a  diameter of 2 mm diameter in the mid siphon on the left is also 2 mm. Anterior and middle cerebral vessels are patent bilaterally with atherosclerotic irregularity. 50% stenoses at both M1 origins. Posterior circulation: Both vertebral arteries are patent at the foramen magnum. Both posterior inferior cerebellar arteries show flow. There is severe stenosis of the left vertebral artery beyond PICA, but the vessel does show patency to the basilar. There is moderate stenosis of the distal right vertebral artery just proximal to the basilar. The basilar artery shows mild atherosclerotic irregularity but no focal stenosis. Superior cerebellar and posterior cerebral arteries are patent. Both anterior and posterior circulation distal branch vessels show atherosclerotic irregularity. Venous sinuses: Patent and normal Anatomic variants: None significant Delayed phase: No abnormal enhancement. Review of the MIP images confirms the above findings IMPRESSION: In this patient with an acute right medulla infarction, there is calcified plaque at the right vertebral artery origin with 70% stenosis. There is noncalcified plaque at the left vertebral artery origin with 50% stenosis. There is atherosclerotic disease affecting the proximal intracranial vertebral arteries with severe stenosis on the left beyond PICA, 70% or greater. There is stenosis of the distal right vertebral artery just proximal to the basilar estimated at 50%. Atherosclerotic change affecting both common carotid arteries and carotid bifurcations but without flow limiting stenosis. 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 Diameter as narrow as 2 mm on each side. 50% stenoses at both M1 origins. Distal intra cranial vessel atherosclerotic change diffusely.  Electronically Signed   By: Paulina Fusi M.D.   On: 12/31/2016 08:20   Mr Brain Wo Contrast  Result Date: 12/31/2016 CLINICAL DATA:  46 y/o M; left-sided facial numbness, headache, pressure  behind the left eye, and weakness in the legs. EXAM: MRI HEAD WITHOUT CONTRAST TECHNIQUE: Multiplanar, multiecho pulse sequences of the brain and surrounding structures were obtained without intravenous contrast. COMPARISON:  12/30/2016 CT head. FINDINGS: Brain: Subcentimeter focus of diffusion restriction within the left posterolateral medulla. Scattered foci of T2 FLAIR hyperintensity in subcortical and periventricular white matter are nonspecific. Mild diffuse brain parenchymal volume loss. No abnormal susceptibility hypointensity to indicate intracranial hemorrhage. No focal mass effect, hydrocephalus, or extra-axial collection. Vascular: Normal flow voids. Skull and upper cervical spine: Normal marrow signal. Sinuses/Orbits: Negative.  Underpneumatized frontal sinuses. Other: None. IMPRESSION: 1. Subcentimeter acute/early subacute infarction within the left posterolateral medulla. 2. Nonspecific T2 FLAIR hyperintense white matter foci unexpected for age probably represent microvascular ischemic changes particularly in the setting of diabetes, less likely migraine headache or sequelae of demyelination, vasculitis, and other infectious/inflammatory processes. These results were called by telephone at the time of interpretation on 12/31/2016 at 12:52 am to Dr. Ross Marcus , who verbally acknowledged these results. Electronically Signed   By: Mitzi Hansen M.D.   On: 12/31/2016 00:53       PHYSICAL EXAM Obese middle-age male currently not in distress. . Afebrile. Head is nontraumatic. Neck is supple without bruit.    Cardiac exam no murmur or gallop. Lungs are clear to auscultation. Distal pulses are well felt. Neurological Exam ;  Awake  Alert oriented x 3. Normal speech and language.eye movements full without nystagmus but mild exotropia of the left eye.fundi were not visualized. Vision acuity and fields appear normal. Hearing is normal. Palatal movements are normal. Face symmetric. Tongue  midline. Normal strength, tone, reflexes and coordination. Normal sensation. Gait deferred.  ASSESSMENT/PLAN Mr. Jahshua Bonito is a 46 y.o. male with history of hypertension, diabetes mellitus, formal smoker (quit 6 weeks ago) presenting with left facial numbness, headache and gait . He did not receive IV t-PA due to delay in arrival.   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 Reading inverted per Dr. Pearlean Brownie - stenosis worse on the L per his reading), proximal L VA 70% stenosis just beyond PICA, distal R VA proximal to BA 50%. Severe carotid siphon atherosclerosis. 50% stenosis B M1  2D Echo  pending   LDL pending   HgbA1c pending  Lovenox 40 mg sq daily for VTE prophylaxis  Diet heart healthy/carb modified Room service appropriate? Yes; Fluid consistency: Thin  aspirin 81 mg daily prior to admission, now on aspirin 325 mg daily. Given large vessel intracranial atherosclerosis, patient should be treated with aspirin 81 mg and clopidogrel 75 mg orally every day x 3 months for secondary stroke prevention. After 3 months, change to plavix alone. Long-term dual antiplatelets are contraindicated due to risk for intracerebral hemorrhage.   Patient counseled to be compliant with his antithrombotic medications  Ongoing aggressive stroke risk factor management  Therapy recommendations:  Outpatient PT and OT  Disposition:  Return home  Hypertension  elevated but stable  Long-term BP goal normotensive  Hyperlipidemia  Home meds:  No statin  LDL pending, goal < 70  Now  on Lipitor 40 mg daily   Continue statin at discharge  Diabetes  Uncontrolled. Glucose is elevated   Diabetes. Nurse following for goal 140-180   HgbA1c pending , goal < 7.0  Other Stroke Risk  Factors  Cigarette smoker, quit smoking 6 weeks ago. advised to continue to stop smoking  ETOH use, advised to drink no more than 2 drink(s) a day  Morbid Obesity, Body mass index is 41.86 kg/m., recommend weight loss, diet and exercise as appropriate   Other Active Problems  Acute kidney injury  Hospital day # 0  I have personally examined this patient, reviewed notes, independently viewed imaging studies, participated in medical decision making and plan of care.ROS completed by me personally and pertinent positives fully documented  I have made any additions or clarifications directly to the above note. Agree with note above. He presented with facial numbness and gait ataxia and headache due to left medullary infarct active and symptomatic vertebral artery stenosis. Recommend aspirin and Plavix for 3 months followed by Plavix alone. Aggressive risk factor modification. Discussed with patient possible interest in participation in the stroke atrial fibrillation trial  he is interested but unfortunately excluded due to age. Greater than 50% time during this 35 minute visit was spent on counseling and coordination of care about stroke risk, prevention and treatment discussion and answering questions  Delia Heady, MD Medical Director [facility] Pager: [phone] 12/31/2016 2:19 PM   To contact Stroke Continuity provider, please refer to [email or web address]. After hours, contact General Neurology

## 2017-01-01 ENCOUNTER — Inpatient Hospital Stay (HOSPITAL_COMMUNITY): Payer: BLUE CROSS/BLUE SHIELD

## 2017-01-01 DIAGNOSIS — E119 Type 2 diabetes mellitus without complications: Secondary | ICD-10-CM

## 2017-01-01 DIAGNOSIS — I63011 Cerebral infarction due to thrombosis of right vertebral artery: Secondary | ICD-10-CM

## 2017-01-01 DIAGNOSIS — G463 Brain stem stroke syndrome: Secondary | ICD-10-CM

## 2017-01-01 LAB — LIPID PANEL
CHOL/HDL RATIO: 7.1 ratio
CHOLESTEROL: 234 mg/dL — AB (ref 0–200)
HDL: 33 mg/dL — AB (ref >40)
LDL Cholesterol: 168 mg/dL — ABNORMAL HIGH (ref 0–99)
TRIGLYCERIDES: 166 mg/dL — AB (ref <150)
VLDL: 33 mg/dL (ref 0–40)

## 2017-01-01 LAB — COMPREHENSIVE METABOLIC PANEL
ALT: 15 U/L — ABNORMAL LOW (ref 17–63)
ANION GAP: 9 (ref 5–15)
AST: 15 U/L (ref 15–41)
Albumin: 3.1 g/dL — ABNORMAL LOW (ref 3.5–5.0)
Alkaline Phosphatase: 54 U/L (ref 38–126)
BUN: 12 mg/dL (ref 6–20)
CALCIUM: 8.4 mg/dL — AB (ref 8.9–10.3)
CHLORIDE: 92 mmol/L — AB (ref 101–111)
CO2: 30 mmol/L (ref 22–32)
Creatinine, Ser: 1.1 mg/dL (ref 0.61–1.24)
GFR calc non Af Amer: >60 mL/min (ref >60)
Glucose, Bld: 276 mg/dL — ABNORMAL HIGH (ref 65–99)
Potassium: 3.9 mmol/L (ref 3.5–5.1)
SODIUM: 131 mmol/L — AB (ref 135–145)
Total Bilirubin: 0.5 mg/dL (ref 0.3–1.2)
Total Protein: 7.2 g/dL (ref 6.5–8.1)

## 2017-01-01 LAB — GLUCOSE, CAPILLARY
GLUCOSE-CAPILLARY: 237 mg/dL — AB (ref 65–99)
Glucose-Capillary: 278 mg/dL — ABNORMAL HIGH (ref 65–99)
Glucose-Capillary: 260 mg/dL — ABNORMAL HIGH (ref 65–99)
Glucose-Capillary: 309 mg/dL — ABNORMAL HIGH (ref 65–99)

## 2017-01-01 LAB — CBC
HCT: 44.3 % (ref 39.0–52.0)
Hemoglobin: 14.9 g/dL (ref 13.0–17.0)
MCH: 29.3 pg (ref 26.0–34.0)
MCHC: 33.6 g/dL (ref 30.0–36.0)
MCV: 87 fL (ref 78.0–100.0)
PLATELETS: 261 10*3/uL (ref 150–400)
RBC: 5.09 MIL/uL (ref 4.22–5.81)
RDW: 13.1 % (ref 11.5–15.5)
WBC: 12.8 10*3/uL — ABNORMAL HIGH (ref 4.0–10.5)

## 2017-01-01 LAB — ECHOCARDIOGRAM LIMITED
Height: 74 in
Weight: 5216 oz

## 2017-01-01 MED ORDER — ATORVASTATIN CALCIUM 40 MG PO TABS: 40.0000 mg | ORAL_TABLET | Freq: Every day | ORAL | 3 refills | Status: DC

## 2017-01-01 MED ORDER — CLOPIDOGREL BISULFATE 75 MG PO TABS: 75.0000 mg | ORAL_TABLET | Freq: Every day | ORAL | 12 refills | Status: DC

## 2017-01-01 MED ORDER — LIVING WELL WITH DIABETES BOOK
Freq: Once | Status: AC
  Administered 2017-01-01: 14:00:00
  Filled 2017-01-01 (×2): qty 1

## 2017-01-01 MED ORDER — INSULIN GLARGINE 100 UNIT/ML ~~LOC~~ SOLN: 30.0000 [IU] | Freq: Every day | SUBCUTANEOUS | 11 refills | Status: DC

## 2017-01-01 MED ORDER — INSULIN STARTER KIT- SYRINGES (ENGLISH)
1.0000 | Freq: Once | Status: AC
  Administered 2017-01-01: 1
  Filled 2017-01-01 (×2): qty 1

## 2017-01-01 MED ORDER — INSULIN STARTER KIT- SYRINGES (ENGLISH): 1.0000 | Freq: Once | 0 refills | Status: AC

## 2017-01-01 MED ORDER — PERFLUTREN LIPID MICROSPHERE
1.0000 mL | INTRAVENOUS | Status: AC | PRN
  Filled 2017-01-01: qty 10

## 2017-01-01 MED ORDER — PROMETHAZINE HCL 25 MG PO TABS
12.5000 mg | ORAL_TABLET | Freq: Once | ORAL | Status: AC
  Administered 2017-01-01: 12.5 mg via ORAL
  Filled 2017-01-01: qty 1

## 2017-01-01 NOTE — Progress Notes (Signed)
  Echocardiogram 2D Echocardiogram has been performed.  Arvil ChacoFoster, Latice Waitman 01/01/2017, 11:05 AM

## 2017-01-01 NOTE — Discharge Summary (Addendum)
Physician Discharge Summary  Nicholas Mejia MRN: [id number] DOB/AGE: 48-Jan-1972 48 y.o.  PCP: Phineas Inches, MD   Admit date: 12/30/2016 Discharge date: 01/01/2017  Discharge Diagnoses:    Principal Problem:   Stroke (cerebrum) [facility]) Active Problems:   Diabetes mellitus without complication (HCC)   AKI (acute kidney injury) ([place])   Leukocytosis   Essential hypertension    Follow-up recommendations Follow-up with PCP in 3-5 days , including all  additional recommended appointments as below Follow-up CBC, CMP in 3-5 days Follow-up with Garvin Fila, MD, in 4-6 weeks      Current Discharge Medication List    START taking these medications   Details  atorvastatin (LIPITOR) 40 MG tablet Take 1 tablet (40 mg total) by mouth daily at 6 PM. Qty: 30 tablet, Refills: 3    clopidogrel (PLAVIX) 75 MG tablet Take 1 tablet (75 mg total) by mouth daily. Qty: 30 tablet, Refills: 12    insulin glargine (LANTUS) 100 UNIT/ML injection Inject 0.3 mLs (30 Units total) into the skin daily. Qty: 10 mL, Refills: 11      CONTINUE these medications which have NOT CHANGED   Details  aspirin EC 81 MG tablet Take 162 mg by mouth daily.    chlorthalidone (HYGROTON) 25 MG tablet Take 25 mg by mouth daily.    fluticasone (FLONASE) 50 MCG/ACT nasal spray Place 1 spray into both nostrils daily.    fluticasone-salmeterol (ADVAIR HFA) 115-21 MCG/ACT inhaler Inhale 2 puffs into the lungs 2 (two) times daily as needed (shortness of breath).    guaiFENesin-codeine 100-10 MG/5ML syrup Take 5 mLs by mouth 3 (three) times daily as needed for cough.    montelukast (SINGULAIR) 10 MG tablet Take 10 mg by mouth at bedtime.    Turmeric 500 MG TABS Take 1,000 mg by mouth 2 (two) times daily.      STOP taking these medications     metFORMIN (GLUCOPHAGE-XR) 500 MG 24 hr tablet          Discharge Condition: Stable   Discharge Instructions Get Medicines reviewed and adjusted: Please take all your  medications with you for your next visit with your Primary MD  Please request your Primary MD to go over all hospital tests and procedure/radiological results at the follow up, please ask your Primary MD to get all Hospital records sent to his/her office.  If you experience worsening of your admission symptoms, develop shortness of breath, life threatening emergency, suicidal or homicidal thoughts you must seek medical attention immediately by calling 911 or calling your MD immediately if symptoms less severe.  You must read complete instructions/literature along with all the possible adverse reactions/side effects for all the Medicines you take and that have been prescribed to you. Take any new Medicines after you have completely understood and accpet all the possible adverse reactions/side effects.   Do not drive when taking Pain medications.   Do not take more than prescribed Pain, Sleep and Anxiety Medications  Special Instructions: If you have smoked or chewed Tobacco in the last 2 yrs please stop smoking, stop any regular Alcohol and or any Recreational drug use.  Wear Seat belts while driving.  Please note  You were cared for by a hospitalist during your hospital stay. Once you are discharged, your primary care physician will handle any further medical issues. Please note that NO REFILLS for any discharge medications will be authorized once you are discharged, as it is imperative that you return to your primary care  physician (or establish a relationship with a primary care physician if you do not have one) for your aftercare needs so that they can reassess your need for medications and monitor your lab values.     Allergies  Allergen Reactions  . Bee Venom Anaphylaxis and Swelling      Disposition: Outpatient PT/OT   Consults: Neurology    Significant Diagnostic Studies:  Ct Angio Head W/cm &/or Wo Cm  Result Date: 12/31/2016 CLINICAL DATA:  Left facial numbness.   Left medulla infarction. EXAM: CT ANGIOGRAPHY HEAD AND NECK TECHNIQUE: Multidetector CT imaging of the head and neck was performed using the standard protocol during bolus administration of intravenous contrast. Multiplanar CT image reconstructions and MIPs were obtained to evaluate the vascular anatomy. Carotid stenosis measurements (when applicable) are obtained utilizing NASCET criteria, using the distal internal carotid diameter as the denominator. CONTRAST:  50 cc Isovue 370 COMPARISON:  MRI same day FINDINGS: CTA NECK FINDINGS Aortic arch: No atherosclerotic change, aneurysm or dissection. Right carotid system: Common carotid artery shows some circumferential wall thickening with minimal luminal diameter of 5 mm compared to an expected diameter of 8 mm. Carotid bifurcation shows atherosclerotic disease with a small region of calcified plaque and circumferential wall thickening. Minimal diameter of the ICA is 4 mm. Compared to a more distal cervical ICA diameter of 4.5 mm, this indicates a 15-20% stenosis. Cervical ICA beyond that is widely patent. Left carotid system: Common carotid artery shows a normal caliber throughout. Mild atherosclerotic change at the carotid bifurcation with a punctate calcification and mild wall thickening. No narrowing of the ICA relative to the more distal cervical ICA. Vertebral arteries: Right vertebral artery origin shows calcified plaque with stenosis estimated at 70% or greater. Beyond that, the right vertebral artery is widely patent through the cervical region. Left vertebral artery origin shows 50% stenosis. Beyond that, the vessel is widely patent through the cervical region. Skeleton: Ordinary mild lower cervical spondylosis. Other neck: No mass or lymphadenopathy. Upper chest: Normal Review of the MIP images confirms the above findings CTA HEAD FINDINGS Anterior circulation: Both internal carotid arteries are patent through the siphon regions. There is atherosclerosis  throughout the siphon regions with extensive calcification. Stenosis at the proximal siphon on the right shows a diameter of 2 mm diameter in the mid siphon on the left is also 2 mm. Anterior and middle cerebral vessels are patent bilaterally with atherosclerotic irregularity. 50% stenoses at both M1 origins. Posterior circulation: Both vertebral arteries are patent at the foramen magnum. Both posterior inferior cerebellar arteries show flow. There is severe stenosis of the left vertebral artery beyond PICA, but the vessel does show patency to the basilar. There is moderate stenosis of the distal right vertebral artery just proximal to the basilar. The basilar artery shows mild atherosclerotic irregularity but no focal stenosis. Superior cerebellar and posterior cerebral arteries are patent. Both anterior and posterior circulation distal branch vessels show atherosclerotic irregularity. Venous sinuses: Patent and normal Anatomic variants: None significant Delayed phase: No abnormal enhancement. Review of the MIP images confirms the above findings IMPRESSION: In this patient with an acute right medulla infarction, there is calcified plaque at the right vertebral artery origin with 70% stenosis. There is noncalcified plaque at the left vertebral artery origin with 50% stenosis. There is atherosclerotic disease affecting the proximal intracranial vertebral arteries with severe stenosis on the left beyond PICA, 70% or greater. There is stenosis of the distal right vertebral artery just proximal to the basilar  estimated at 50%. Atherosclerotic change affecting both common carotid arteries and carotid bifurcations but without flow limiting stenosis. 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 Diameter as narrow as 2 mm on each side. 50% stenoses at both M1 origins. Distal intra cranial vessel atherosclerotic change diffusely. Electronically Signed   By: Nelson Chimes M.D.   On: 12/31/2016 08:20   Dg  Chest 2 View  Result Date: 12/31/2016 CLINICAL DATA:  Fever EXAM: CHEST  2 VIEW COMPARISON:  None. FINDINGS: Heart is borderline in size. Lingular atelectasis or scarring. Right lung is clear. No effusions or acute bony abnormality. IMPRESSION: Lingular atelectasis or scarring. Electronically Signed   By: Rolm Baptise M.D.   On: 12/31/2016 13:48   Ct Head Wo Contrast  Result Date: 12/30/2016 CLINICAL DATA:  Left-sided numbness starting at 11 a.m. History of hypertension. EXAM: CT HEAD WITHOUT CONTRAST TECHNIQUE: Contiguous axial images were obtained from the base of the skull through the vertex without intravenous contrast. COMPARISON:  None. FINDINGS: Brain: No evidence of acute infarction, hemorrhage, hydrocephalus, extra-axial collection or mass lesion/mass effect. Vascular: Vascular calcifications are present. Skull: Normal. Negative for fracture or focal lesion. Sinuses/Orbits: No acute finding. Other: None. IMPRESSION: No acute intracranial abnormalities. Electronically Signed   By: Lucienne Capers M.D.   On: 12/30/2016 22:25   Ct Angio Neck W/cm &/or Wo/cm  Result Date: 12/31/2016 CLINICAL DATA:  Left facial numbness.  Left medulla infarction. EXAM: CT ANGIOGRAPHY HEAD AND NECK TECHNIQUE: Multidetector CT imaging of the head and neck was performed using the standard protocol during bolus administration of intravenous contrast. Multiplanar CT image reconstructions and MIPs were obtained to evaluate the vascular anatomy. Carotid stenosis measurements (when applicable) are obtained utilizing NASCET criteria, using the distal internal carotid diameter as the denominator. CONTRAST:  50 cc Isovue 370 COMPARISON:  MRI same day FINDINGS: CTA NECK FINDINGS Aortic arch: No atherosclerotic change, aneurysm or dissection. Right carotid system: Common carotid artery shows some circumferential wall thickening with minimal luminal diameter of 5 mm compared to an expected diameter of 8 mm. Carotid bifurcation shows  atherosclerotic disease with a small region of calcified plaque and circumferential wall thickening. Minimal diameter of the ICA is 4 mm. Compared to a more distal cervical ICA diameter of 4.5 mm, this indicates a 15-20% stenosis. Cervical ICA beyond that is widely patent. Left carotid system: Common carotid artery shows a normal caliber throughout. Mild atherosclerotic change at the carotid bifurcation with a punctate calcification and mild wall thickening. No narrowing of the ICA relative to the more distal cervical ICA. Vertebral arteries: Right vertebral artery origin shows calcified plaque with stenosis estimated at 70% or greater. Beyond that, the right vertebral artery is widely patent through the cervical region. Left vertebral artery origin shows 50% stenosis. Beyond that, the vessel is widely patent through the cervical region. Skeleton: Ordinary mild lower cervical spondylosis. Other neck: No mass or lymphadenopathy. Upper chest: Normal Review of the MIP images confirms the above findings CTA HEAD FINDINGS Anterior circulation: Both internal carotid arteries are patent through the siphon regions. There is atherosclerosis throughout the siphon regions with extensive calcification. Stenosis at the proximal siphon on the right shows a diameter of 2 mm diameter in the mid siphon on the left is also 2 mm. Anterior and middle cerebral vessels are patent bilaterally with atherosclerotic irregularity. 50% stenoses at both M1 origins. Posterior circulation: Both vertebral arteries are patent at the foramen magnum. Both posterior inferior cerebellar arteries show flow. There is  severe stenosis of the left vertebral artery beyond PICA, but the vessel does show patency to the basilar. There is moderate stenosis of the distal right vertebral artery just proximal to the basilar. The basilar artery shows mild atherosclerotic irregularity but no focal stenosis. Superior cerebellar and posterior cerebral arteries are  patent. Both anterior and posterior circulation distal branch vessels show atherosclerotic irregularity. Venous sinuses: Patent and normal Anatomic variants: None significant Delayed phase: No abnormal enhancement. Review of the MIP images confirms the above findings IMPRESSION: In this patient with an acute right medulla infarction, there is calcified plaque at the right vertebral artery origin with 70% stenosis. There is noncalcified plaque at the left vertebral artery origin with 50% stenosis. There is atherosclerotic disease affecting the proximal intracranial vertebral arteries with severe stenosis on the left beyond PICA, 70% or greater. There is stenosis of the distal right vertebral artery just proximal to the basilar estimated at 50%. Atherosclerotic change affecting both common carotid arteries and carotid bifurcations but without flow limiting stenosis. 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 Diameter as narrow as 2 mm on each side. 50% stenoses at both M1 origins. Distal intra cranial vessel atherosclerotic change diffusely. Electronically Signed   By: Nelson Chimes M.D.   On: 12/31/2016 08:20   Mr Brain Wo Contrast  Result Date: 12/31/2016 CLINICAL DATA:  46 y/o M; left-sided facial numbness, headache, pressure behind the left eye, and weakness in the legs. EXAM: MRI HEAD WITHOUT CONTRAST TECHNIQUE: Multiplanar, multiecho pulse sequences of the brain and surrounding structures were obtained without intravenous contrast. COMPARISON:  12/30/2016 CT head. FINDINGS: Brain: Subcentimeter focus of diffusion restriction within the left posterolateral medulla. Scattered foci of T2 FLAIR hyperintensity in subcortical and periventricular white matter are nonspecific. Mild diffuse brain parenchymal volume loss. No abnormal susceptibility hypointensity to indicate intracranial hemorrhage. No focal mass effect, hydrocephalus, or extra-axial collection. Vascular: Normal flow voids. Skull and  upper cervical spine: Normal marrow signal. Sinuses/Orbits: Negative.  Underpneumatized frontal sinuses. Other: None. IMPRESSION: 1. Subcentimeter acute/early subacute infarction within the left posterolateral medulla. 2. Nonspecific T2 FLAIR hyperintense white matter foci unexpected for age probably represent microvascular ischemic changes particularly in the setting of diabetes, less likely migraine headache or sequelae of demyelination, vasculitis, and other infectious/inflammatory processes. These results were called by telephone at the time of interpretation on 12/31/2016 at 12:52 am to Dr. Thayer Jew , who verbally acknowledged these results. Electronically Signed   By: Kristine Garbe M.D.   On: 12/31/2016 00:53    echocardiogram  LV EF: 50% -   55%  ------------------------------------------------------------------- Indications:      CVA 436.  ------------------------------------------------------------------- History:   PMH:  AKI.  Risk factors:  Hypertension. Diabetes mellitus.  ------------------------------------------------------------------- Study Conclusions  - Left ventricle: The cavity size was normal. There was moderate   concentric hypertrophy. Systolic function was normal. The   estimated ejection fraction was in the range of 50% to 55%. Mild   diffuse hypokinesis. Doppler parameters are consistent with high   ventricular filling pressure. - Aortic valve: Transvalvular velocity was within the normal range.   There was no stenosis. There was no regurgitation. - Mitral valve: Transvalvular velocity was within the normal range.   There was no evidence for stenosis. There was no regurgitation. - Left atrium: The atrium was mildly dilated. - Right ventricle: The cavity size was normal. Wall thickness was   normal. Systolic function was normal. - Tricuspid valve: There was no regurgitation.  Impressions:  -  Endocardial border definition is poor. Suggest  repeat echo with   Definity contrast for assessment of left ventricular function if   clinically indicated.      Filed Weights   12/30/16 2139 12/31/16 0300  Weight: (!) 145.2 kg (320 lb) (!) 147.9 kg (326 lb)     Microbiology: No results found for this or any previous visit (from the past 240 hour(s)).     Blood Culture No results found for: SDES, SPECREQUEST, CULT, REPTSTATUS    Labs: Results for orders placed or performed during the hospital encounter of 12/30/16 (from the past 48 hour(s))  Glucose, capillary     Status: Abnormal   Collection Time: 12/30/16  9:28 PM  Result Value Ref Range   Glucose-Capillary 414 (H) 65 - 99 mg/dL  Protime-INR     Status: None   Collection Time: 12/30/16  9:38 PM  Result Value Ref Range   Prothrombin Time 13.0 11.4 - 15.2 seconds   INR 0.98   APTT     Status: None   Collection Time: 12/30/16  9:38 PM  Result Value Ref Range   aPTT 30 24 - 36 seconds  CBC     Status: Abnormal   Collection Time: 12/30/16  9:38 PM  Result Value Ref Range   WBC 12.2 (H) 4.0 - 10.5 K/uL   RBC 5.30 4.22 - 5.81 MIL/uL   Hemoglobin 15.8 13.0 - 17.0 g/dL   HCT 46.2 39.0 - 52.0 %   MCV 87.2 78.0 - 100.0 fL   MCH 29.8 26.0 - 34.0 pg   MCHC 34.2 30.0 - 36.0 g/dL   RDW 13.1 11.5 - 15.5 %   Platelets 267 150 - 400 K/uL  Differential     Status: Abnormal   Collection Time: 12/30/16  9:38 PM  Result Value Ref Range   Neutrophils Relative % 73 %   Neutro Abs 8.9 (H) 1.7 - 7.7 K/uL   Lymphocytes Relative 21 %   Lymphs Abs 2.5 0.7 - 4.0 K/uL   Monocytes Relative 5 %   Monocytes Absolute 0.6 0.1 - 1.0 K/uL   Eosinophils Relative 1 %   Eosinophils Absolute 0.1 0.0 - 0.7 K/uL   Basophils Relative 0 %   Basophils Absolute 0.0 0.0 - 0.1 K/uL  Comprehensive metabolic panel     Status: Abnormal   Collection Time: 12/30/16  9:38 PM  Result Value Ref Range   Sodium 131 (L) 135 - 145 mmol/L   Potassium 4.2 3.5 - 5.1 mmol/L   Chloride 89 (L) 101 - 111 mmol/L    CO2 32 22 - 32 mmol/L   Glucose, Bld 415 (H) 65 - 99 mg/dL   BUN 20 6 - 20 mg/dL   Creatinine, Ser 1.34 (H) 0.61 - 1.24 mg/dL   Calcium 8.9 8.9 - 10.3 mg/dL   Total Protein 7.4 6.5 - 8.1 g/dL   Albumin 3.5 3.5 - 5.0 g/dL   AST 21 15 - 41 U/L   ALT 16 (L) 17 - 63 U/L   Alkaline Phosphatase 53 38 - 126 U/L   Total Bilirubin 0.4 0.3 - 1.2 mg/dL   GFR calc non Af Amer >60 >60 mL/min   GFR calc Af Amer >60 >60 mL/min    Comment: (NOTE) The eGFR has been calculated using the CKD EPI equation. This calculation has not been validated in all clinical situations. eGFR's persistently <60 mL/min signify possible Chronic Kidney Disease.    Anion gap 10 5 - 15  Urinalysis, Routine w reflex microscopic     Status: Abnormal   Collection Time: 12/30/16  9:46 PM  Result Value Ref Range   Color, Urine YELLOW YELLOW   APPearance CLEAR CLEAR   Specific Gravity, Urine 1.028 1.005 - 1.030   pH 5.0 5.0 - 8.0   Glucose, UA >=500 (A) NEGATIVE mg/dL   Hgb urine dipstick SMALL (A) NEGATIVE   Bilirubin Urine NEGATIVE NEGATIVE   Ketones, ur NEGATIVE NEGATIVE mg/dL   Protein, ur 30 (A) NEGATIVE mg/dL   Nitrite NEGATIVE NEGATIVE   Leukocytes, UA NEGATIVE NEGATIVE   RBC / HPF 0-5 0 - 5 RBC/hpf   WBC, UA 0-5 0 - 5 WBC/hpf   Bacteria, UA RARE (A) NONE SEEN   Squamous Epithelial / LPF NONE SEEN NONE SEEN   Hyaline Casts, UA PRESENT   Rapid urine drug screen (hospital performed)     Status: None   Collection Time: 12/30/16  9:46 PM  Result Value Ref Range   Opiates NONE DETECTED NONE DETECTED   Cocaine NONE DETECTED NONE DETECTED   Benzodiazepines NONE DETECTED NONE DETECTED   Amphetamines NONE DETECTED NONE DETECTED   Tetrahydrocannabinol NONE DETECTED NONE DETECTED   Barbiturates NONE DETECTED NONE DETECTED    Comment:        DRUG SCREEN FOR MEDICAL PURPOSES ONLY.  IF CONFIRMATION IS NEEDED FOR ANY PURPOSE, NOTIFY LAB WITHIN 5 DAYS.        LOWEST DETECTABLE LIMITS FOR URINE DRUG SCREEN Drug  Class       Cutoff (ng/mL) Amphetamine      1000 Barbiturate      200 Benzodiazepine   767 Tricyclics       341 Opiates          300 Cocaine          300 THC              50   Creatinine, urine, random     Status: None   Collection Time: 12/30/16  9:46 PM  Result Value Ref Range   Creatinine, Urine 134.76 mg/dL  Sodium, urine, random     Status: None   Collection Time: 12/30/16  9:46 PM  Result Value Ref Range   Sodium, Ur 11 mmol/L  I-stat troponin, ED     Status: None   Collection Time: 12/30/16  9:55 PM  Result Value Ref Range   Troponin i, poc 0.00 0.00 - 0.08 ng/mL   Comment 3            Comment: Due to the release kinetics of cTnI, a negative result within the first hours of the onset of symptoms does not rule out myocardial infarction with certainty. If myocardial infarction is still suspected, repeat the test at appropriate intervals.   I-Stat Chem 8, ED     Status: Abnormal   Collection Time: 12/30/16  9:56 PM  Result Value Ref Range   Sodium 134 (L) 135 - 145 mmol/L   Potassium 4.1 3.5 - 5.1 mmol/L   Chloride 88 (L) 101 - 111 mmol/L   BUN 23 (H) 6 - 20 mg/dL   Creatinine, Ser 1.30 (H) 0.61 - 1.24 mg/dL   Glucose, Bld 429 (H) 65 - 99 mg/dL   Calcium, Ion 1.15 1.15 - 1.40 mmol/L   TCO2 33 0 - 100 mmol/L   Hemoglobin 17.3 (H) 13.0 - 17.0 g/dL   HCT 51.0 39.0 - 52.0 %  CBG monitoring, ED     Status: Abnormal  Collection Time: 12/30/16 10:56 PM  Result Value Ref Range   Glucose-Capillary 453 (H) 65 - 99 mg/dL  Troponin I (q 6hr x 3)     Status: Abnormal   Collection Time: 12/31/16  2:55 AM  Result Value Ref Range   Troponin I 0.03 (HH) <0.03 ng/mL    Comment: CRITICAL RESULT CALLED TO, READ BACK BY AND VERIFIED WITH: CHANEY A,RN 12/31/16 0407 WAYK   Glucose, capillary     Status: Abnormal   Collection Time: 12/31/16  6:26 AM  Result Value Ref Range   Glucose-Capillary 398 (H) 65 - 99 mg/dL   Comment 1 Notify RN    Comment 2 Document in Chart   Troponin  I     Status: None   Collection Time: 12/31/16  8:44 AM  Result Value Ref Range   Troponin I <0.03 <0.03 ng/mL  Glucose, capillary     Status: Abnormal   Collection Time: 12/31/16 11:10 AM  Result Value Ref Range   Glucose-Capillary 356 (H) 65 - 99 mg/dL   Comment 1 Notify RN   Glucose, capillary     Status: Abnormal   Collection Time: 12/31/16  4:40 PM  Result Value Ref Range   Glucose-Capillary 252 (H) 65 - 99 mg/dL  Glucose, capillary     Status: Abnormal   Collection Time: 12/31/16  9:25 PM  Result Value Ref Range   Glucose-Capillary 266 (H) 65 - 99 mg/dL   Comment 1 Notify RN    Comment 2 Document in Chart   Lipid panel     Status: Abnormal   Collection Time: 01/01/17  2:20 AM  Result Value Ref Range   Cholesterol 234 (H) 0 - 200 mg/dL   Triglycerides 166 (H) <150 mg/dL   HDL 33 (L) >40 mg/dL   Total CHOL/HDL Ratio 7.1 RATIO   VLDL 33 0 - 40 mg/dL   LDL Cholesterol 168 (H) 0 - 99 mg/dL    Comment:        Total Cholesterol/HDL:CHD Risk Coronary Heart Disease Risk Table                     Men   Women  1/2 Average Risk   3.4   3.3  Average Risk       5.0   4.4  2 X Average Risk   9.6   7.1  3 X Average Risk  23.4   11.0        Use the calculated Patient Ratio above and the CHD Risk Table to determine the patient's CHD Risk.        ATP III CLASSIFICATION (LDL):  <100     mg/dL   Optimal  100-129  mg/dL   Near or Above                    Optimal  130-159  mg/dL   Borderline  160-189  mg/dL   High  >190     mg/dL   Very High   CBC     Status: Abnormal   Collection Time: 01/01/17  2:20 AM  Result Value Ref Range   WBC 12.8 (H) 4.0 - 10.5 K/uL   RBC 5.09 4.22 - 5.81 MIL/uL   Hemoglobin 14.9 13.0 - 17.0 g/dL   HCT 44.3 39.0 - 52.0 %   MCV 87.0 78.0 - 100.0 fL   MCH 29.3 26.0 - 34.0 pg   MCHC 33.6 30.0 - 36.0 g/dL  RDW 13.1 11.5 - 15.5 %   Platelets 261 150 - 400 K/uL  Comprehensive metabolic panel     Status: Abnormal   Collection Time: 01/01/17  2:20 AM   Result Value Ref Range   Sodium 131 (L) 135 - 145 mmol/L   Potassium 3.9 3.5 - 5.1 mmol/L   Chloride 92 (L) 101 - 111 mmol/L   CO2 30 22 - 32 mmol/L   Glucose, Bld 276 (H) 65 - 99 mg/dL   BUN 12 6 - 20 mg/dL   Creatinine, Ser 1.10 0.61 - 1.24 mg/dL   Calcium 8.4 (L) 8.9 - 10.3 mg/dL   Total Protein 7.2 6.5 - 8.1 g/dL   Albumin 3.1 (L) 3.5 - 5.0 g/dL   AST 15 15 - 41 U/L   ALT 15 (L) 17 - 63 U/L   Alkaline Phosphatase 54 38 - 126 U/L   Total Bilirubin 0.5 0.3 - 1.2 mg/dL   GFR calc non Af Amer >60 >60 mL/min   GFR calc Af Amer >60 >60 mL/min    Comment: (NOTE) The eGFR has been calculated using the CKD EPI equation. This calculation has not been validated in all clinical situations. eGFR's persistently <60 mL/min signify possible Chronic Kidney Disease.    Anion gap 9 5 - 15  Glucose, capillary     Status: Abnormal   Collection Time: 01/01/17  6:49 AM  Result Value Ref Range   Glucose-Capillary 237 (H) 65 - 99 mg/dL   Comment 1 Notify RN    Comment 2 Document in Chart      Lipid Panel     Component Value Date/Time   CHOL 234 (H) 01/01/2017 0220   TRIG 166 (H) 01/01/2017 0220   HDL 33 (L) 01/01/2017 0220   CHOLHDL 7.1 01/01/2017 0220   VLDL 33 01/01/2017 0220   LDLCALC 168 (H) 01/01/2017 0220     No results found for: HGBA1C   Lab Results  Component Value Date   LDLCALC 168 (H) 01/01/2017   CREATININE 1.10 01/01/2017     HPI :  46 y.o.malewith medical history significant of hypertension, diabetes mellitus, formal smoker (quit 6 weeks ago), who presents with left facial numbness, headache and gait instability.presented to the [facility] ED with c/c of left sided facial numbness beginning about 11 AM on 2/5 (LKW). He also had a headache involving his left periorbital and retro-orbital region. He was seen for this, receiving Toradol and an unknown BP medication. Soon thereafter he began experiencing numbness of the left side of his face. He went home, slept, and on  awakening he experienced severe gait instability and left sided incoordination. Also felt as though he was "drifting to the left" while ambulating. He states that he has slight difficulty swallowing with a sensation that swallowed material does not move as well in the left side of his throat relative to the right when he swallows. He recently finished a course of antibiotic for pneumonia. He has a PMHx of DM. He denies prior stroke or MI. Patient was not administered IV t-PA secondary to delay in arrival. 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 Reading inverted per Dr. Leonie Man - stenosis worse on the L per his reading),  proximal L VA 70% stenosis just beyond PICA, distal R VA proximal to BA 50%. Severe carotid siphon atherosclerosis. 50% stenosis B M1  2D Echo   as above   LDL  168  HgbA1c pending  Diet regular  aspirin 81 mg daily prior to admission, as per neurology patient should be treated with aspirin 81 mg and clopidogrel 75 mg orally every day x 3 months for secondary stroke prevention. After 3 months, change to plavix alone. Long-term dual antiplatelets are contraindicated due to risk for intracerebral hemorrhage.   Therapy recommendations:  Outpatient PT and OT vs CIR   Follow-up with neurology in the outpatient setting in 4-6 weeks     HTN: Continue outpatient regimen, aggressive risk factor modification recommended  DM-II:Last A1 not on record. Patient is takingmetformin.at home. Blood sugar 415 upon this admission. Started on Lantus 30 units qhs,   diabetes coordinator consulted to teach insulin administration, Accu-Chek monitoring    AKI:cre 1.34 Likely due to prerenal  secondary to dehydration and continuation of diruetics. Resolved, creatinine 1.1 prior to discharge    Hx of Tobacco abuse: pt quit smoking 6 weeks ago -Did counseling about importance of not to restart smoking -refused Nicotine patch   Blood pressure (!) 153/81, pulse 83, temperature 98.4 F (36.9 C), temperature source Oral, resp. rate 18, height _0  (1.88 m), weight (!) 147.9 kg (326 lb), SpO2 98 %.  Cardiac: S1/S2, RRR, No murmurs, No gallops or rubs. Respiratory: No rales, wheezing, rhonchi or rubs. GI: Soft, nondistended, nontender, no rebound pain, no organomegaly, BS present. GU: No hematuria Ext: No pitting leg edema bilaterally. 2+DP/PT pulse bilaterally. Musculoskeletal: No joint deformities, No joint redness or warmth, no limitation of ROM in spin. Skin: No rashes.  Neuro: Alert, oriented X3, cranial nerves II-XII grossly intact, moves all extremities normally. Muscle strength 5/5 in all extremities, sensation to light touch intact. Brachial reflex 2+ bilaterally. Knee reflex 1+ bilaterally. Negative Babinski's sign. Normal finger to nose test. Psych: Patient is not psychotic, no suicidal or hemocidal ideation.    Follow-up Information    BOUSKA,DAVID E, MD. Call.   Specialty:  Family Medicine Why:  To make follow-up appointment in 3-5 days, acute CVA Contact information: [place] [address] [phone]        Antony Contras, MD. Call.   Specialties:  Neurology, Radiology Why:  To make follow-up appointment in 4-6 weeks Contact information: [address] [place] [address] [phone]           Signed: Reyne Dumas 01/01/2017, 10:16 AM        Time spent >45 mins

## 2017-01-01 NOTE — Progress Notes (Addendum)
Inpatient Diabetes Program Recommendations  AACE/ADA: New Consensus Statement on Inpatient Glycemic Control (2015)  Target Ranges:  Prepandial:   less than 140 mg/dL      Peak postprandial:   less than 180 mg/dL (1-2 hours)      Critically ill patients:  140 - 180 mg/dL   Lab Results  Component Value Date   GLUCAP 278 (H) 01/01/2017    Referral received.  Diabetes history: Type 2 diabetes-A1C pending Outpatient Diabetes medications: Metformin XR 500 mg daily Current orders for Inpatient glycemic control:  Novolog sensitive tid with meals, Lantus 20 units daily Inpatient Diabetes Program Recommendations:    Note that patient is being d/c'd home on insulin.  He states that he thinks his last A1C was 7.9%.  Patient admits that he has been under a lot of stress recently and has not been doing well with diet and lifestyle modifications. MD told patient that she would order insulin once daily at home.  He asked if he could eventually come off on insulin and she stated "yes, with weight loss and lifestyle modifications".  Discussed survival skills of diabetes with patient.  Briefly reviewed signs and symptoms and 15/15 rule for treatment of hypoglycemia.  Further discussed importance of moderation and having family support. "Brother" in room and offering support to patient as well.  Patient seems very motivated to take care of himself and "do better".  Ordered Living well with diabetes booklet and insulin starter kit.  RN will review insulin injection technique and allow patient to practice giving insulin prior to d/c.  Patient appreciative of visit and is interested in outpatient diabetes education.   Thanks, Adah Perl, RN, BC-ADM Inpatient Diabetes Coordinator Pager [phone] (8a-5p)

## 2017-01-01 NOTE — Progress Notes (Signed)
Occupational Therapy Treatment Patient Details Name: Nicholas Mejia [place] MRN: [id number] DOB: 01/09/1971 Today's Date: 01/01/2017    History of present illness 46 yo male admitted with L facial numbness, HA and gait instability. 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  PMH positive for DM, HTN, and tobacco use.    OT comments  Pt demonstrates worse balance today compared to yesterday.  Without use of AD (which is pt's normal level of functioning), he requires min - mod A for ADLs.  He also demonstrates dysconjugate gaze with vertical pursuits on Lt and Rt.   He lives alone, and will only have friends assisting him intermittently.  He is at very high risk for fall/injury.  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   Feel CIR is best venue to allow him to regain his independence to allow him to return home mod I, and ultimately return to work   Follow Up Recommendations  CIR    Equipment Recommendations  None recommended by OT    Recommendations for Other Services      Precautions / Restrictions Precautions Precautions: Fall       Mobility Bed Mobility                  Transfers Overall transfer level: Needs assistance Equipment used: None Transfers: Sit to/from RaytheonStand;Stand Pivot Transfers Sit to Stand: Min guard Stand pivot transfers: Min assist       General transfer comment: min A without UE support     Balance Overall balance assessment: Needs assistance Sitting-balance support: Feet supported Sitting balance-Leahy Scale: Good       Standing balance-Leahy Scale: Fair Standing balance comment: Pt able to maintain static standing with min guard assist, but loses balance with dynamic standing activities during ADLs requiring min - mod A to recover                    ADL Overall ADL's : Needs  assistance/impaired Eating/Feeding: Independent   Grooming: Wash/dry hands;Wash/dry face;Brushing hair;Minimal assistance;Standing       Lower Body Bathing: Minimal assistance;Sit to/from stand Lower Body Bathing Details (indicate cue type and reason): without UE support, mod A to access feet in standing without UE support which  Upper Body Dressing : Set up;Sitting   Lower Body Dressing: Minimal assistance;Sit to/from stand Lower Body Dressing Details (indicate cue type and reason): without UE support  Toilet Transfer: Moderate assistance;Ambulation;Comfort height toilet;Grab bars;RW StatisticianToilet Transfer Details (indicate cue type and reason): ambulating without AD  Toileting- Clothing Manipulation and Hygiene: Minimal assistance;Sit to/from stand Toileting - Clothing Manipulation Details (indicate cue type and reason): without UE support  Tub/ Shower Transfer: Minimal assistance;Ambulation Tub/Shower Transfer Details (indicate cue type and reason): Pt instructed in options for shower seats  Functional mobility during ADLs: Moderate assistance General ADL Comments: Pt with LOB during ADLs without UE support       Vision Eye Alignment: Impaired (comment)   Ocular Range of Motion: Within Functional Limits Tracking/Visual Pursuits: Decreased smoothness of vertical tracking     Diplopia Assessment: Disappears with one eye closed   Additional Comments: Pt complains of nausea when he looks up.  Dysconjugate gaze noted on Lt and Rt with vertical tracking and pt with correlative diplopia, and mild dizziness.   He reports he has been able to read without difficulty    Perception  Praxis      Cognition   Behavior During Therapy: WFL for tasks assessed/performed Overall Cognitive Status: Within Functional Limits for tasks assessed                       Extremity/Trunk Assessment               Exercises     Shoulder Instructions       General Comments       Pertinent Vitals/ Pain       Pain Assessment: No/denies pain Faces Pain Scale: Hurts a little bit Pain Intervention(s): Monitored during session  Home Living       Type of Home: Apartment                              Lives With: Alone    Prior Functioning/Environment              Frequency  Min 3X/week        Progress Toward Goals  OT Goals(current goals can now be found in the care plan section)  Progress towards OT goals: Progressing toward goals     Plan Discharge plan needs to be updated    Co-evaluation                 End of Session Equipment Utilized During Treatment: Gait belt;Rolling walker   Activity Tolerance Patient tolerated treatment well   Patient Left in chair;with call bell/phone within reach   Nurse Communication Mobility status        Time: 1545-1620 OT Time Calculation (min): 35 min  Charges: OT General Charges $OT Visit: 1 Procedure OT Treatments $Self Care/Home Management : 23-37 mins  Saray Capasso M 01/01/2017, 4:59 PM

## 2017-01-01 NOTE — Evaluation (Signed)
Speech Language Pathology Evaluation Patient Details Name: Nicholas Mejia MRN: [id number] DOB: 13-Dec-1970 Today's Date: 01/01/2017 Time: [id number] SLP Time Calculation (min) (ACUTE ONLY): 28 min  Problem List:  Patient Active Problem List   Diagnosis Date Noted  . 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 Diabetes mellitus without complication (HCC) 12/31/2016  . AKI (acute kidney injury) (HCC) 12/31/2016  . Leukocytosis 12/31/2016  . Essential hypertension 12/31/2016   Past Medical History:  Past Medical History:  Diagnosis Date  . Diabetes mellitus without complication [facility](HCC)    Past Surgical History:  Past Surgical History:  Procedure Laterality Date  . EXTERNAL EAR SURGERY     HPI:  Ptis a 46 y.o.malewith PMH significant of hypertension, DM, former smoker (quit 6 weeks ago), who presented with left facial numbness, headache, gait instability, and mild nasuea. Pt stated that he has slight difficulty swallowing with a sensation that swallowed material does not move as well in the left side of his throat relative to the right. MRI showed subcentimeter acute/early subacute infarction within the left posterolateral medulla and probable microvascular ischemic changes particularly in the setting of diabetes. CXR showed lingular atelectasis or scarring, however no effusions.   Assessment / Plan / Recommendation Clinical Impression  Nicholas Mejia was alert, oriented X4, and reported that he lives alone, works at [employer], and does not have any concerns regarding his cognition. Oral motor assessment was [facility]. The Montreal Cognitive Assessment (MoCA) was administered to evaluate Nicholas Mejia's cognitive and language skills yielding an overall score of 26/30 which is WNL. Delayed recall/memory was the only tested domain with impairments; category and multiple choice cues were beneficial and resulted in the successful recall of all 5 words. SLP educated pt and RN re: results (normal) of the cognitive  assessment. No speech/cognitive therapy recommended at this time-ST signing off.    SLP Assessment  Patient does not need any further Speech Lanaguage Pathology Services    Follow Up Recommendations       Frequency and Duration           SLP Evaluation Cognition  Overall Cognitive Status: Within Functional Limits for tasks assessed Arousal/Alertness: Awake/alert Orientation Level: Oriented X4 Attention: Sustained Sustained Attention: Appears intact Memory: Impaired Memory Impairment: Retrieval deficit;Decreased short term memory Decreased Short Term Memory: Verbal basic Awareness: Appears intact Problem Solving: Appears intact Safety/Judgment: Appears intact       Comprehension  Auditory Comprehension Overall Auditory Comprehension: Appears within functional limits for tasks assessed Yes/No Questions: Not tested Commands: Not tested Conversation: Complex Visual Recognition/Discrimination Discrimination: Not tested Reading Comprehension Reading Status: Not tested    Expression Expression Primary Mode of Expression: Verbal Verbal Expression Overall Verbal Expression: Appears within functional limits for tasks assessed Initiation: No impairment Level of Generative/Spontaneous Verbalization: Conversation Repetition: No impairment Naming: No impairment Pragmatics: No impairment Written Expression Written Expression: Not tested   Oral / Motor  Oral Motor/Sensory Function Overall Oral Motor/Sensory Function: Within functional limits Motor Speech Overall Motor Speech: Appears within functional limits for tasks assessed   GO                    Macarthur CritchleyMeredith Cadee Agro , Student-SLP 01/01/2017, 1:23 PM

## 2017-01-01 NOTE — Consult Note (Signed)
Physical Medicine and Rehabilitation Consult   Reason for Consult: Gait disorder, ataxia and nausea with activity. Referring Physician: Dr. Pearlean Brownie   HPI: Nicholas Mejia is a 46 y.o. male with history of HTN, T2DM, tobacco use (quit 6 weeks ago) who was admitted to [facility] on 12/31/16 with left facial numbness, HA and unsteady gait. 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  2D echo with contrast revealed EF 55-60% and no wall abnormality or thrombus.  Dr. Pearlean Brownie recommended ASA and plavix for thrombotic stroke due to L-VA stenosis/large vessel disease. Therapy evaluations done and patient with balance deficits and poor safety. CIR recommended for follow up therapy.    Review of Systems  Constitutional: Positive for malaise/fatigue (has been sick for past 7 months/just getting over PNA).  HENT: Negative for hearing loss and tinnitus.   Eyes: Positive for blurred vision (decreased vision left eye due to trauma). Negative for double vision and pain.  Respiratory: Negative for cough and shortness of breath.   Cardiovascular: Negative for chest pain and palpitations.  Gastrointestinal: Positive for constipation, heartburn and nausea.  Genitourinary: Negative for frequency and urgency.  Musculoskeletal: Negative for joint pain, myalgias and neck pain.  Skin: Negative for itching and rash.  Neurological: Positive for sensory change (numbness and tingling in hands and feet.  Now with left face numbness and right side without cold sensation. ) and headaches. Negative for dizziness.      Past Medical History:  Diagnosis Date  . Diabetes mellitus without complication [facility])     Past Surgical History:  Procedure Laterality Date  . EXTERNAL EAR SURGERY      Family History  Problem Relation Age of Onset  .  Heart attack Father   . Gout Brother     Social History:  Lives alone--. Work as Teacher, early years/pre for [employer]. He reports that he has quit smoking 6 weeks ago.  He does not have any smokeless tobacco history on file. He reports that he drinks a glass of wine daily. He reports that he does not use drugs.    Allergies  Allergen Reactions  . Bee Venom Anaphylaxis and Swelling    Medications Prior to Admission  Medication Sig Dispense Refill  . aspirin EC 81 MG tablet Take 162 mg by mouth daily.    . chlorthalidone (HYGROTON) 25 MG tablet Take 25 mg by mouth daily.    . fluticasone (FLONASE) 50 MCG/ACT nasal spray Place 1 spray into both nostrils daily.    . fluticasone-salmeterol (ADVAIR HFA) 115-21 MCG/ACT inhaler Inhale 2 puffs into the lungs 2 (two) times daily as needed (shortness of breath).    [name] guaiFENesin-codeine 100-10 MG/5ML syrup Take 5 mLs by mouth 3 (three) times daily as needed for cough.    . metFORMIN (GLUCOPHAGE-XR) 500 MG 24 hr tablet Take 500 mg by mouth See admin instructions. Take 1 tablet every morning, takes another tablet in the evening if remembers    . montelukast (SINGULAIR) 10 MG tablet Take 10 mg by mouth at bedtime.    . Turmeric 500 MG TABS Take 1,000 mg by mouth 2 (two) times daily.      Home: Home Living Family/patient expects to be discharged to:: Private residence Living Arrangements: Alone Available Help at Discharge: Friend(s), Available PRN/intermittently Type of Home: Apartment Home  Access: Stairs to enter [employer] of Steps: flight Entrance Stairs-Rails: Right Home Layout: One level Bathroom Shower/Tub: Engineer, manufacturing systems: Standard Home Equipment: None Additional Comments: states mom can come from [place] to stay temporarily  Functional History: Prior Function Level of Independence: Independent Comments: worked for [employer] on desktops (states walked 3 miles a day) Functional Status:    Mobility: Bed Mobility Overal bed mobility: Independent General bed mobility comments: pt able to get up with no cuing or assistance. Transfers Overall transfer level: Needs assistance Equipment used: None Transfers: Sit to/from Stand Sit to Stand: Supervision General transfer comment: supervision for safety due to pt's unsteadiness in standing. Pt able to stand with no assistance from AD or SPTA. Ambulation/Gait Ambulation/Gait assistance: Min assist, Mod assist Ambulation Distance (Feet): 250 Feet Assistive device: Rolling walker (2 wheeled), Quad cane Gait Pattern/deviations: Step-through pattern, Decreased step length - right, Decreased step length - left, Narrow base of support General Gait Details: initially began with straight cane but tansitioned back to RW for patient safety. Pt experienced three episodes of LOB with min-mod A to correct. Encouraged pt to progress slowly after getting up to avoid LOB. Pt required verbal cuing to widen BOS and to correct left lean. Pt aware of defecit but unable to correct.; expressed frustration of deficits at end of tx.  Stairs: Yes Stairs assistance: Min guard Stair Management: One rail Right, Alternating pattern, Step to pattern, Sideways, Forwards Number of Stairs: 10 General stair comments: unable to perform due to limited session time    ADL: ADL Overall ADL's : Needs assistance/impaired Eating/Feeding: Independent Grooming: Wash/dry hands, Wash/dry face, Oral care, Min guard, Standing Grooming Details (indicate cue type and reason): pt static standing able to complete task but requires UE support Upper Body Bathing: Min guard Lower Body Bathing: Min guard Toilet Transfer: RW, Moderate assistance Functional mobility during ADLs: Moderate assistance, Rolling walker General ADL Comments: pt demonstrates scissoring and falling left with transfer. pt able to sit eOB and don shoes MOD I . pt able to static stand at sink in sara stedy min  guard (A). pt however when transfering immediately falling L   Cognition: Cognition Overall Cognitive Status: Impaired/Different from baseline Orientation Level: Oriented X4 Cognition Arousal/Alertness: Awake/alert Behavior During Therapy: WFL for tasks assessed/performed Overall Cognitive Status: Impaired/Different from baseline Area of Impairment: Safety/judgement Safety/Judgement: Decreased awareness of deficits General Comments: Pt requests to wear tennis shoes to help prevent falling. Progressed awareness of deficits throughout tx.   Blood pressure (!) 153/81, pulse 83, temperature 98.4 F (36.9 C), temperature source Oral, resp. rate 18, height 6\' 2"  (1.88 m), weight (!) 147.9 kg (326 lb), SpO2 98 %. Physical Exam  Nursing note and vitals reviewed. Constitutional: He is oriented to person, place, and time. He appears well-developed and well-nourished.  Morbidly obese male sitting up in chair--NAD but reports mild nausea.   HENT:  Head: Normocephalic and atraumatic.  Eyes: Conjunctivae are normal. Pupils are equal, round, and reactive to light.  Neck: Normal range of motion. Neck supple.  Cardiovascular: Normal rate and regular rhythm.   No murmur heard. Respiratory: Effort normal and breath sounds normal. No stridor. No respiratory distress.  GI: Soft. Bowel sounds are normal. He exhibits no distension. There is no tenderness.  Musculoskeletal: He exhibits no edema or tenderness.  Neurological: He is alert and oriented to person, place, and time.  Left facial numbness and decreased sensation to cold on right. Speech clear. Able to follow commands without difficulty.  Skin: Skin is warm and dry. No rash noted. No erythema.  Psychiatric: He has a normal mood and affect. His behavior is normal. Thought content normal.  Mild dysmetria, left finger-nose-finger, no dysmetria on the right side. Motor strength is 5/5 bilateral deltoids by stress of grip, hip flexion, knee extension,  ankle dorsal flexor. Standing balance, fair with wide base support, falls to left with normal base of support  Results for orders placed or performed during the hospital encounter of 12/30/16 (from the past 24 hour(s))  Glucose, capillary     Status: Abnormal   Collection Time: 12/31/16  4:40 PM  Result Value Ref Range   Glucose-Capillary 252 (H) 65 - 99 mg/dL  Glucose, capillary     Status: Abnormal   Collection Time: 12/31/16  9:25 PM  Result Value Ref Range   Glucose-Capillary 266 (H) 65 - 99 mg/dL   Comment 1 Notify RN    Comment 2 Document in Chart   Lipid panel     Status: Abnormal   Collection Time: 01/01/17  2:20 AM  Result Value Ref Range   Cholesterol 234 (H) 0 - 200 mg/dL   Triglycerides 161 (H) <150 mg/dL   HDL 33 (L) >09 mg/dL   Total CHOL/HDL Ratio 7.1 RATIO   VLDL 33 0 - 40 mg/dL   LDL Cholesterol 604 (H) 0 - 99 mg/dL  CBC     Status: Abnormal   Collection Time: 01/01/17  2:20 AM  Result Value Ref Range   WBC 12.8 (H) 4.0 - 10.5 K/uL   RBC 5.09 4.22 - 5.81 MIL/uL   Hemoglobin 14.9 13.0 - 17.0 g/dL   HCT 54.0 98.1 - 19.1 %   MCV 87.0 78.0 - 100.0 fL   MCH 29.3 26.0 - 34.0 pg   MCHC 33.6 30.0 - 36.0 g/dL   RDW 47.8 29.5 - 62.1 %   Platelets 261 150 - 400 K/uL  Comprehensive metabolic panel     Status: Abnormal   Collection Time: 01/01/17  2:20 AM  Result Value Ref Range   Sodium 131 (L) 135 - 145 mmol/L   Potassium 3.9 3.5 - 5.1 mmol/L   Chloride 92 (L) 101 - 111 mmol/L   CO2 30 22 - 32 mmol/L   Glucose, Bld 276 (H) 65 - 99 mg/dL   BUN 12 6 - 20 mg/dL   Creatinine, Ser 3.08 0.61 - 1.24 mg/dL   Calcium 8.4 (L) 8.9 - 10.3 mg/dL   Total Protein 7.2 6.5 - 8.1 g/dL   Albumin 3.1 (L) 3.5 - 5.0 g/dL   AST 15 15 - 41 U/L   ALT 15 (L) 17 - 63 U/L   Alkaline Phosphatase 54 38 - 126 U/L   Total Bilirubin 0.5 0.3 - 1.2 mg/dL   GFR calc non Af Amer >60 >60 mL/min   GFR calc Af Amer >60 >60 mL/min   Anion gap 9 5 - 15  Glucose, capillary     Status: Abnormal    Collection Time: 01/01/17  6:49 AM  Result Value Ref Range   Glucose-Capillary 237 (H) 65 - 99 mg/dL   Comment 1 Notify RN    Comment 2 Document in Chart   Glucose, capillary     Status: Abnormal   Collection Time: 01/01/17 11:36 AM  Result Value Ref Range   Glucose-Capillary 278 (H) 65 - 99 mg/dL   Comment 1 Notify RN    Comment 2 Document in Chart    Ct Angio Head  W/cm &/or Wo Cm  Result Date: 12/31/2016 CLINICAL DATA:  Left facial numbness.  Left medulla infarction. EXAM: CT ANGIOGRAPHY HEAD AND NECK TECHNIQUE: Multidetector CT imaging of the head and neck was performed using the standard protocol during bolus administration of intravenous contrast. Multiplanar CT image reconstructions and MIPs were obtained to evaluate the vascular anatomy. Carotid stenosis measurements (when applicable) are obtained utilizing NASCET criteria, using the distal internal carotid diameter as the denominator. CONTRAST:  50 cc Isovue 370 COMPARISON:  MRI same day FINDINGS: CTA NECK FINDINGS Aortic arch: No atherosclerotic change, aneurysm or dissection. Right carotid system: Common carotid artery shows some circumferential wall thickening with minimal luminal diameter of 5 mm compared to an expected diameter of 8 mm. Carotid bifurcation shows atherosclerotic disease with a small region of calcified plaque and circumferential wall thickening. Minimal diameter of the ICA is 4 mm. Compared to a more distal cervical ICA diameter of 4.5 mm, this indicates a 15-20% stenosis. Cervical ICA beyond that is widely patent. Left carotid system: Common carotid artery shows a normal caliber throughout. Mild atherosclerotic change at the carotid bifurcation with a punctate calcification and mild wall thickening. No narrowing of the ICA relative to the more distal cervical ICA. Vertebral arteries: Right vertebral artery origin shows calcified plaque with stenosis estimated at 70% or greater. Beyond that, the right vertebral artery is  widely patent through the cervical region. Left vertebral artery origin shows 50% stenosis. Beyond that, the vessel is widely patent through the cervical region. Skeleton: Ordinary mild lower cervical spondylosis. Other neck: No mass or lymphadenopathy. Upper chest: Normal Review of the MIP images confirms the above findings CTA HEAD FINDINGS Anterior circulation: Both internal carotid arteries are patent through the siphon regions. There is atherosclerosis throughout the siphon regions with extensive calcification. Stenosis at the proximal siphon on the right shows a diameter of 2 mm diameter in the mid siphon on the left is also 2 mm. Anterior and middle cerebral vessels are patent bilaterally with atherosclerotic irregularity. 50% stenoses at both M1 origins. Posterior circulation: Both vertebral arteries are patent at the foramen magnum. Both posterior inferior cerebellar arteries show flow. There is severe stenosis of the left vertebral artery beyond PICA, but the vessel does show patency to the basilar. There is moderate stenosis of the distal right vertebral artery just proximal to the basilar. The basilar artery shows mild atherosclerotic irregularity but no focal stenosis. Superior cerebellar and posterior cerebral arteries are patent. Both anterior and posterior circulation distal branch vessels show atherosclerotic irregularity. Venous sinuses: Patent and normal Anatomic variants: None significant Delayed phase: No abnormal enhancement. Review of the MIP images confirms the above findings IMPRESSION: In this patient with an acute right medulla infarction, there is calcified plaque at the right vertebral artery origin with 70% stenosis. There is noncalcified plaque at the left vertebral artery origin with 50% stenosis. There is atherosclerotic disease affecting the proximal intracranial vertebral arteries with severe stenosis on the left beyond PICA, 70% or greater. There is stenosis of the distal right  vertebral artery just proximal to the basilar estimated at 50%. Atherosclerotic change affecting both common carotid arteries and carotid bifurcations but without flow limiting stenosis. 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 Diameter as narrow as 2 mm on each side. 50% stenoses at both M1 origins. Distal intra cranial vessel atherosclerotic change diffusely. Electronically Signed   By: Paulina Fusi M.D.   On: 12/31/2016 08:20   Dg Chest 2 View  Result Date:  12/31/2016 CLINICAL DATA:  Fever EXAM: CHEST  2 VIEW COMPARISON:  None. FINDINGS: Heart is borderline in size. Lingular atelectasis or scarring. Right lung is clear. No effusions or acute bony abnormality. IMPRESSION: Lingular atelectasis or scarring. Electronically Signed   By: Charlett Nose M.D.   On: 12/31/2016 13:48   Ct Head Wo Contrast  Result Date: 12/30/2016 CLINICAL DATA:  Left-sided numbness starting at 11 a.m. History of hypertension. EXAM: CT HEAD WITHOUT CONTRAST TECHNIQUE: Contiguous axial images were obtained from the base of the skull through the vertex without intravenous contrast. COMPARISON:  None. FINDINGS: Brain: No evidence of acute infarction, hemorrhage, hydrocephalus, extra-axial collection or mass lesion/mass effect. Vascular: Vascular calcifications are present. Skull: Normal. Negative for fracture or focal lesion. Sinuses/Orbits: No acute finding. Other: None. IMPRESSION: No acute intracranial abnormalities. Electronically Signed   By: Burman Nieves M.D.   On: 12/30/2016 22:25   Ct Angio Neck W/cm &/or Wo/cm  Result Date: 12/31/2016 CLINICAL DATA:  Left facial numbness.  Left medulla infarction. EXAM: CT ANGIOGRAPHY HEAD AND NECK TECHNIQUE: Multidetector CT imaging of the head and neck was performed using the standard protocol during bolus administration of intravenous contrast. Multiplanar CT image reconstructions and MIPs were obtained to evaluate the vascular anatomy. Carotid stenosis measurements  (when applicable) are obtained utilizing NASCET criteria, using the distal internal carotid diameter as the denominator. CONTRAST:  50 cc Isovue 370 COMPARISON:  MRI same day FINDINGS: CTA NECK FINDINGS Aortic arch: No atherosclerotic change, aneurysm or dissection. Right carotid system: Common carotid artery shows some circumferential wall thickening with minimal luminal diameter of 5 mm compared to an expected diameter of 8 mm. Carotid bifurcation shows atherosclerotic disease with a small region of calcified plaque and circumferential wall thickening. Minimal diameter of the ICA is 4 mm. Compared to a more distal cervical ICA diameter of 4.5 mm, this indicates a 15-20% stenosis. Cervical ICA beyond that is widely patent. Left carotid system: Common carotid artery shows a normal caliber throughout. Mild atherosclerotic change at the carotid bifurcation with a punctate calcification and mild wall thickening. No narrowing of the ICA relative to the more distal cervical ICA. Vertebral arteries: Right vertebral artery origin shows calcified plaque with stenosis estimated at 70% or greater. Beyond that, the right vertebral artery is widely patent through the cervical region. Left vertebral artery origin shows 50% stenosis. Beyond that, the vessel is widely patent through the cervical region. Skeleton: Ordinary mild lower cervical spondylosis. Other neck: No mass or lymphadenopathy. Upper chest: Normal Review of the MIP images confirms the above findings CTA HEAD FINDINGS Anterior circulation: Both internal carotid arteries are patent through the siphon regions. There is atherosclerosis throughout the siphon regions with extensive calcification. Stenosis at the proximal siphon on the right shows a diameter of 2 mm diameter in the mid siphon on the left is also 2 mm. Anterior and middle cerebral vessels are patent bilaterally with atherosclerotic irregularity. 50% stenoses at both M1 origins. Posterior circulation: Both  vertebral arteries are patent at the foramen magnum. Both posterior inferior cerebellar arteries show flow. There is severe stenosis of the left vertebral artery beyond PICA, but the vessel does show patency to the basilar. There is moderate stenosis of the distal right vertebral artery just proximal to the basilar. The basilar artery shows mild atherosclerotic irregularity but no focal stenosis. Superior cerebellar and posterior cerebral arteries are patent. Both anterior and posterior circulation distal branch vessels show atherosclerotic irregularity. Venous sinuses: Patent and normal Anatomic variants: None  significant Delayed phase: No abnormal enhancement. Review of the MIP images confirms the above findings IMPRESSION: In this patient with an acute right medulla infarction, there is calcified plaque at the right vertebral artery origin with 70% stenosis. There is noncalcified plaque at the left vertebral artery origin with 50% stenosis. There is atherosclerotic disease affecting the proximal intracranial vertebral arteries with severe stenosis on the left beyond PICA, 70% or greater. There is stenosis of the distal right vertebral artery just proximal to the basilar estimated at 50%. Atherosclerotic change affecting both common carotid arteries and carotid bifurcations but without flow limiting stenosis. 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 Diameter as narrow as 2 mm on each side. 50% stenoses at both M1 origins. Distal intra cranial vessel atherosclerotic change diffusely. Electronically Signed   By: Paulina Fusi M.D.   On: 12/31/2016 08:20   Mr Brain Wo Contrast  Result Date: 12/31/2016 CLINICAL DATA:  46 y/o M; left-sided facial numbness, headache, pressure behind the left eye, and weakness in the legs. EXAM: MRI HEAD WITHOUT CONTRAST TECHNIQUE: Multiplanar, multiecho pulse sequences of the brain and surrounding structures were obtained without intravenous contrast. COMPARISON:   12/30/2016 CT head. FINDINGS: Brain: Subcentimeter focus of diffusion restriction within the left posterolateral medulla. Scattered foci of T2 FLAIR hyperintensity in subcortical and periventricular white matter are nonspecific. Mild diffuse brain parenchymal volume loss. No abnormal susceptibility hypointensity to indicate intracranial hemorrhage. No focal mass effect, hydrocephalus, or extra-axial collection. Vascular: Normal flow voids. Skull and upper cervical spine: Normal marrow signal. Sinuses/Orbits: Negative.  Underpneumatized frontal sinuses. Other: None. IMPRESSION: 1. Subcentimeter acute/early subacute infarction within the left posterolateral medulla. 2. Nonspecific T2 FLAIR hyperintense white matter foci unexpected for age probably represent microvascular ischemic changes particularly in the setting of diabetes, less likely migraine headache or sequelae of demyelination, vasculitis, and other infectious/inflammatory processes. These results were called by telephone at the time of interpretation on 12/31/2016 at 12:52 am to Dr. Ross Marcus , who verbally acknowledged these results. Electronically Signed   By: Mitzi Hansen M.D.   On: 12/31/2016 00:53    Assessment/Plan: Diagnosis: Wallenberg syndrome with left hemiataxia, right hemisensory deficits, left facial dysesthesias, gait disorder 1. Does the need for close, 24 hr/day medical supervision in concert with the patient's rehab needs make it unreasonable for this patient to be served in a less intensive setting? Yes 2. Co-Morbidities requiring supervision/potential complications: Diabetes, uncontrolled, hypertension 3. Due to bladder management, bowel management, safety, skin/wound care, disease management, medication administration, pain management and patient education, does the patient require 24 hr/day rehab nursing? Yes 4. Does the patient require coordinated care of a physician, rehab nurse, PT (1-2 hrs/day, 5 days/week)  and OT (1-2 hrs/day, 5 days/week) to address physical and functional deficits in the context of the above medical diagnosis(es)? Yes Addressing deficits in the following areas: balance, endurance, locomotion, strength, transferring, bowel/bladder control, bathing, dressing, feeding, grooming, toileting, cognition, speech, language, swallowing and psychosocial support 5. Can the patient actively participate in an intensive therapy program of at least 3 hrs of therapy per day at least 5 days per week? Yes 6. The potential for patient to make measurable gains while on inpatient rehab is excellent 7. Anticipated functional outcomes upon discharge from inpatient rehab are modified independent  with PT, modified independent with OT, n/a with SLP. 8. Estimated rehab length of stay to reach the above functional goals is: 7-10d 9. Does the patient have adequate social supports and living environment to accommodate  these discharge functional goals? Yes 10. Anticipated D/C setting: Home 11. Anticipated post D/C treatments: HH therapy 12. Overall Rehab/Functional Prognosis: excellent  RECOMMENDATIONS: This patient's condition is appropriate for continued rehabilitative care in the following setting: CIR Patient has agreed to participate in recommended program. Yes Note that insurance prior authorization may be required for reimbursement for recommended care.  Comment: Still having CVA related problems with  nausea and vomiting, this can be addressed on the rehabilitation unit   Jacquelynn CreeLove, Pamela S, Cordelia Poche-C 01/01/2017

## 2017-01-01 NOTE — Progress Notes (Signed)
Pt c/o nausea; MD notified and new order received for x1 phenergan 12.5mg  po. Dionne BucyP. Amo Knut Rondinelli RN

## 2017-01-01 NOTE — Progress Notes (Signed)
Per PT/OT patient with a change in function. Neuro has placed CIR consult. Pt is from home alone and does not have anyone that can assist him on a regular basis. CM spoke to Dr Susie CassetteAbrol about d/c home and she stated d/c to be held until tomorrow. Bedside RN updated.

## 2017-01-01 NOTE — Progress Notes (Signed)
STROKE TEAM PROGRESS NOTE   HISTORY OF PRESENT ILLNESS (per record) Nicholas Mejia an 46 y.o.malewho presented to the [facility] ED with c/c of left sided facial numbness beginning about 11 AM on 2/5 (LKW). He also had a headache involving his left periorbital and retro-orbital region. He was seen for this, receiving Toradol and an unknown BP medication. Soon thereafter he began experiencing numbness of the left side of his face. He went home, slept, and on awakening he experienced severe gait instability and left sided incoordination. Also felt as though he was "drifting to the left" while ambulating. He states that he has slight difficulty swallowing with a sensation that swallowed material does not move as well in the left side of his throat relative to the right when he swallows. He recently finished a course of antibiotic for pneumonia. He has a PMHx of DM. He denies prior stroke or MI. Patient was not administered IV t-PA secondary to delay in arrival. He was admitted to the for further evaluation and treatment.   SUBJECTIVE (INTERVAL HISTORY) Patient is walking with his therapist. He is excluded from stroke afib rial due to his age OBJECTIVE Temp:  [19.8 F (36.6 C)-98.4 F (36.9 C)] 97.9 F (36.6 C) (02/08 1353) Pulse Rate:  [78-85] 85 (02/08 1353) Cardiac Rhythm: Normal sinus rhythm (02/08 1200) Resp:  [18-20] 20 (02/08 1353) BP: (153-181)/(73-84) 171/81 (02/08 1353) SpO2:  [96 %-98 %] 97 % (02/08 1353)  CBC:   Recent Labs Lab 12/30/16 2138 12/30/16 2156 01/01/17 0220  WBC 12.2*  --  12.8*  NEUTROABS 8.9*  --   --   HGB 15.8 17.3* 14.9  HCT 46.2 51.0 44.3  MCV 87.2  --  87.0  PLT 267  --  261    Basic Metabolic Panel:   Recent Labs Lab 12/30/16 2138 12/30/16 2156 01/01/17 0220  NA 131* 134* 131*  K 4.2 4.1 3.9  CL 89* 88* 92*  CO2 32  --  30  GLUCOSE 415* 429* 276*  BUN 20 23* 12  CREATININE 1.34* 1.30* 1.10  CALCIUM 8.9  --  8.4*    Lipid Panel:     Component  Value Date/Time   CHOL 234 (H) 01/01/2017 0220   TRIG 166 (H) 01/01/2017 0220   HDL 33 (L) 01/01/2017 0220   CHOLHDL 7.1 01/01/2017 0220   VLDL 33 01/01/2017 0220   LDLCALC 168 (H) 01/01/2017 0220   HgbA1c: No results found for: HGBA1C Urine Drug Screen:     Component Value Date/Time   LABOPIA NONE DETECTED 12/30/2016 2146   COCAINSCRNUR NONE DETECTED 12/30/2016 2146   LABBENZ NONE DETECTED 12/30/2016 2146   AMPHETMU NONE DETECTED 12/30/2016 2146   THCU NONE DETECTED 12/30/2016 2146   LABBARB NONE DETECTED 12/30/2016 2146      IMAGING  Ct Angio Head W/cm &/or Wo Cm  Result Date: 12/31/2016 CLINICAL DATA:  Left facial numbness.  Left medulla infarction. EXAM: CT ANGIOGRAPHY HEAD AND NECK TECHNIQUE: Multidetector CT imaging of the head and neck was performed using the standard protocol during bolus administration of intravenous contrast. Multiplanar CT image reconstructions and MIPs were obtained to evaluate the vascular anatomy. Carotid stenosis measurements (when applicable) are obtained utilizing NASCET criteria, using the distal internal carotid diameter as the denominator. CONTRAST:  50 cc Isovue 370 COMPARISON:  MRI same day FINDINGS: CTA NECK FINDINGS Aortic arch: No atherosclerotic change, aneurysm or dissection. Right carotid system: Common carotid artery shows some circumferential wall thickening with minimal luminal diameter  of 5 mm compared to an expected diameter of 8 mm. Carotid bifurcation shows atherosclerotic disease with a small region of calcified plaque and circumferential wall thickening. Minimal diameter of the ICA is 4 mm. Compared to a more distal cervical ICA diameter of 4.5 mm, this indicates a 15-20% stenosis. Cervical ICA beyond that is widely patent. Left carotid system: Common carotid artery shows a normal caliber throughout. Mild atherosclerotic change at the carotid bifurcation with a punctate calcification and mild wall thickening. No narrowing of the ICA  relative to the more distal cervical ICA. Vertebral arteries: Right vertebral artery origin shows calcified plaque with stenosis estimated at 70% or greater. Beyond that, the right vertebral artery is widely patent through the cervical region. Left vertebral artery origin shows 50% stenosis. Beyond that, the vessel is widely patent through the cervical region. Skeleton: Ordinary mild lower cervical spondylosis. Other neck: No mass or lymphadenopathy. Upper chest: Normal Review of the MIP images confirms the above findings CTA HEAD FINDINGS Anterior circulation: Both internal carotid arteries are patent through the siphon regions. There is atherosclerosis throughout the siphon regions with extensive calcification. Stenosis at the proximal siphon on the right shows a diameter of 2 mm diameter in the mid siphon on the left is also 2 mm. Anterior and middle cerebral vessels are patent bilaterally with atherosclerotic irregularity. 50% stenoses at both M1 origins. Posterior circulation: Both vertebral arteries are patent at the foramen magnum. Both posterior inferior cerebellar arteries show flow. There is severe stenosis of the left vertebral artery beyond PICA, but the vessel does show patency to the basilar. There is moderate stenosis of the distal right vertebral artery just proximal to the basilar. The basilar artery shows mild atherosclerotic irregularity but no focal stenosis. Superior cerebellar and posterior cerebral arteries are patent. Both anterior and posterior circulation distal branch vessels show atherosclerotic irregularity. Venous sinuses: Patent and normal Anatomic variants: None significant Delayed phase: No abnormal enhancement. Review of the MIP images confirms the above findings IMPRESSION: In this patient with an acute right medulla infarction, there is calcified plaque at the right vertebral artery origin with 70% stenosis. There is noncalcified plaque at the left vertebral artery origin with 50%  stenosis. There is atherosclerotic disease affecting the proximal intracranial vertebral arteries with severe stenosis on the left beyond PICA, 70% or greater. There is stenosis of the distal right vertebral artery just proximal to the basilar estimated at 50%. Atherosclerotic change affecting both common carotid arteries and carotid bifurcations but without flow limiting stenosis. 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 Diameter as narrow as 2 mm on each side. 50% stenoses at both M1 origins. Distal intra cranial vessel atherosclerotic change diffusely. Electronically Signed   By: Paulina Fusi M.D.   On: 12/31/2016 08:20   Dg Chest 2 View  Result Date: 12/31/2016 CLINICAL DATA:  Fever EXAM: CHEST  2 VIEW COMPARISON:  None. FINDINGS: Heart is borderline in size. Lingular atelectasis or scarring. Right lung is clear. No effusions or acute bony abnormality. IMPRESSION: Lingular atelectasis or scarring. Electronically Signed   By: Charlett Nose M.D.   On: 12/31/2016 13:48   Ct Head Wo Contrast  Result Date: 12/30/2016 CLINICAL DATA:  Left-sided numbness starting at 11 a.m. History of hypertension. EXAM: CT HEAD WITHOUT CONTRAST TECHNIQUE: Contiguous axial images were obtained from the base of the skull through the vertex without intravenous contrast. COMPARISON:  None. FINDINGS: Brain: No evidence of acute infarction, hemorrhage, hydrocephalus, extra-axial collection or mass lesion/mass effect.  Vascular: Vascular calcifications are present. Skull: Normal. Negative for fracture or focal lesion. Sinuses/Orbits: No acute finding. Other: None. IMPRESSION: No acute intracranial abnormalities. Electronically Signed   By: Burman Nieves M.D.   On: 12/30/2016 22:25   Ct Angio Neck W/cm &/or Wo/cm  Result Date: 12/31/2016 CLINICAL DATA:  Left facial numbness.  Left medulla infarction. EXAM: CT ANGIOGRAPHY HEAD AND NECK TECHNIQUE: Multidetector CT imaging of the head and neck was performed using  the standard protocol during bolus administration of intravenous contrast. Multiplanar CT image reconstructions and MIPs were obtained to evaluate the vascular anatomy. Carotid stenosis measurements (when applicable) are obtained utilizing NASCET criteria, using the distal internal carotid diameter as the denominator. CONTRAST:  50 cc Isovue 370 COMPARISON:  MRI same day FINDINGS: CTA NECK FINDINGS Aortic arch: No atherosclerotic change, aneurysm or dissection. Right carotid system: Common carotid artery shows some circumferential wall thickening with minimal luminal diameter of 5 mm compared to an expected diameter of 8 mm. Carotid bifurcation shows atherosclerotic disease with a small region of calcified plaque and circumferential wall thickening. Minimal diameter of the ICA is 4 mm. Compared to a more distal cervical ICA diameter of 4.5 mm, this indicates a 15-20% stenosis. Cervical ICA beyond that is widely patent. Left carotid system: Common carotid artery shows a normal caliber throughout. Mild atherosclerotic change at the carotid bifurcation with a punctate calcification and mild wall thickening. No narrowing of the ICA relative to the more distal cervical ICA. Vertebral arteries: Right vertebral artery origin shows calcified plaque with stenosis estimated at 70% or greater. Beyond that, the right vertebral artery is widely patent through the cervical region. Left vertebral artery origin shows 50% stenosis. Beyond that, the vessel is widely patent through the cervical region. Skeleton: Ordinary mild lower cervical spondylosis. Other neck: No mass or lymphadenopathy. Upper chest: Normal Review of the MIP images confirms the above findings CTA HEAD FINDINGS Anterior circulation: Both internal carotid arteries are patent through the siphon regions. There is atherosclerosis throughout the siphon regions with extensive calcification. Stenosis at the proximal siphon on the right shows a diameter of 2 mm diameter  in the mid siphon on the left is also 2 mm. Anterior and middle cerebral vessels are patent bilaterally with atherosclerotic irregularity. 50% stenoses at both M1 origins. Posterior circulation: Both vertebral arteries are patent at the foramen magnum. Both posterior inferior cerebellar arteries show flow. There is severe stenosis of the left vertebral artery beyond PICA, but the vessel does show patency to the basilar. There is moderate stenosis of the distal right vertebral artery just proximal to the basilar. The basilar artery shows mild atherosclerotic irregularity but no focal stenosis. Superior cerebellar and posterior cerebral arteries are patent. Both anterior and posterior circulation distal branch vessels show atherosclerotic irregularity. Venous sinuses: Patent and normal Anatomic variants: None significant Delayed phase: No abnormal enhancement. Review of the MIP images confirms the above findings IMPRESSION: In this patient with an acute right medulla infarction, there is calcified plaque at the right vertebral artery origin with 70% stenosis. There is noncalcified plaque at the left vertebral artery origin with 50% stenosis. There is atherosclerotic disease affecting the proximal intracranial vertebral arteries with severe stenosis on the left beyond PICA, 70% or greater. There is stenosis of the distal right vertebral artery just proximal to the basilar estimated at 50%. Atherosclerotic change affecting both common carotid arteries and carotid bifurcations but without flow limiting stenosis. 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 Diameter as narrow  as 2 mm on each side. 50% stenoses at both M1 origins. Distal intra cranial vessel atherosclerotic change diffusely. Electronically Signed   By: Paulina Fusi M.D.   On: 12/31/2016 08:20   Mr Brain Wo Contrast  Result Date: 12/31/2016 CLINICAL DATA:  46 y/o M; left-sided facial numbness, headache, pressure behind the left eye, and  weakness in the legs. EXAM: MRI HEAD WITHOUT CONTRAST TECHNIQUE: Multiplanar, multiecho pulse sequences of the brain and surrounding structures were obtained without intravenous contrast. COMPARISON:  12/30/2016 CT head. FINDINGS: Brain: Subcentimeter focus of diffusion restriction within the left posterolateral medulla. Scattered foci of T2 FLAIR hyperintensity in subcortical and periventricular white matter are nonspecific. Mild diffuse brain parenchymal volume loss. No abnormal susceptibility hypointensity to indicate intracranial hemorrhage. No focal mass effect, hydrocephalus, or extra-axial collection. Vascular: Normal flow voids. Skull and upper cervical spine: Normal marrow signal. Sinuses/Orbits: Negative.  Underpneumatized frontal sinuses. Other: None. IMPRESSION: 1. Subcentimeter acute/early subacute infarction within the left posterolateral medulla. 2. Nonspecific T2 FLAIR hyperintense white matter foci unexpected for age probably represent microvascular ischemic changes particularly in the setting of diabetes, less likely migraine headache or sequelae of demyelination, vasculitis, and other infectious/inflammatory processes. These results were called by telephone at the time of interpretation on 12/31/2016 at 12:52 am to Dr. Ross Marcus , who verbally acknowledged these results. Electronically Signed   By: Mitzi Hansen M.D.   On: 12/31/2016 00:53       PHYSICAL EXAM Obese middle-age male currently not in distress. . Afebrile. Head is nontraumatic. Neck is supple without bruit.    Cardiac exam no murmur or gallop. Lungs are clear to auscultation. Distal pulses are well felt. Neurological Exam ;  Awake  Alert oriented x 3. Normal speech and language.eye movements full without nystagmus but mild exotropia of the left eye.fundi were not visualized. Vision acuity and fields appear normal. Hearing is normal. Palatal movements are normal. Face symmetric. Tongue midline. Normal strength,  tone, reflexes and coordination. Normal sensation. Gait deferred.  ASSESSMENT/PLAN [name] is a 46 y.o. male with history of hypertension, diabetes mellitus, formal smoker (quit 6 weeks ago) presenting with left facial numbness, headache and gait . He did not receive IV t-PA due to delay in arrival.   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 Reading inverted per Dr. Pearlean Brownie - stenosis worse on the L per his reading), proximal L VA 70% stenosis just beyond PICA, distal R VA proximal to BA 50%. Severe carotid siphon atherosclerosis. 50% stenosis B M1 2D Echo  EF is estimated at 55-60%. NO wall motion abnormalities are  noted. No LV filling defects noted with contrast  .LDL 168   HgbA1c pending  Lovenox 40 mg sq daily for VTE prophylaxis Diet heart healthy/carb modified Room service appropriate? Yes; Fluid consistency: Thin Diet - low sodium heart healthy  aspirin 81 mg daily prior to admission, now on aspirin 325 mg daily. Given large vessel intracranial atherosclerosis, patient should be treated with aspirin 81 mg and clopidogrel 75 mg orally every day x 3 months for secondary stroke prevention. After 3 months, change to plavix alone. Long-term dual antiplatelets are contraindicated due to risk for intracerebral hemorrhage.   Patient counseled to be compliant with his antithrombotic medications  Ongoing aggressive stroke risk factor management  Therapy recommendations:  Outpatient PT and OT  Disposition:  Return home  Hypertension  elevated but stable  Long-term BP goal normotensive  Hyperlipidemia  Home meds:  No statin  LDL 168, goal < 70  Now on Lipitor 40 mg daily   Continue statin at discharge  Diabetes  Uncontrolled. Glucose is elevated    Diabetes. Nurse following for goal 140-180   HgbA1c pending , goal < 7.0  Other Stroke Risk Factors  Cigarette smoker, quit smoking 6 weeks ago. advised to continue to stop smoking  ETOH use, advised to drink no more than 2 drink(s) a day  Morbid Obesity, Body mass index is 41.86 kg/m., recommend weight loss, diet and exercise as appropriate   Other Active Problems  Acute kidney injury  Hospital day # 1  I have personally examined this patient, reviewed notes, independently viewed imaging studies, participated in medical decision making and plan of care.ROS completed by me personally and pertinent positives fully documented  I have made any additions or clarifications directly to the above note.  [name]. He presented with facial numbness and gait ataxia and headache due to left medullary infarct active and symptomatic vertebral artery stenosis. Recommend aspirin and Plavix for 3 months followed by Plavix alone. Aggressive risk factor modification.  Start statin for lipids.. Follow up as outpatient in stroke clinic in 6 weeks. Stroke team will sign off. Call for questions. Delia HeadyPramod Bay Jarquin, MD Medical Director [facility] Pager: [phone] 01/01/2017 3:59 PM   To contact Stroke Continuity provider, please refer to [email or web address]. After hours, contact General Neurology

## 2017-01-01 NOTE — Progress Notes (Signed)
Tx performed and documented by SPTA under direct guidance and supervision of PTA at all times.  Charting reviewed for accuracy and depicts tx performed and services provided.  Joycelyn RuaAimee Ariyonna Twichell, PTA pager [phone]  Will inform supervising PT of patient's decline in function and need for change in recommendations based on multiple LOB.  Pt is a fall risk to return home at this time.   Joycelyn RuaAimee Jiali Linney, PTA pager [phone]   Physical Therapy Treatment Patient Details Name: Nicholas Mejia MRN: [id number] DOB: 04/14/1971 Today's Date: 01/01/2017    History of Present Illness 46 yo male admitted with L facial numbness, HA and gait instability. 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  PMH positive for DM, HTN, and tobacco use.     PT Comments    Pt had difficult time with use of straight cane today.  SPTA educated pt on proper use of cane but he was unable to demonstrate proper initiation and sequencing. He was displaying tremor-like motion during pre-gait stage when trying to initiate reciprocal gait pattern; switched to RW for safety in continuation of ambulation. LOB noted in static standing and 2 LOB episodes with ambulation (1 using cane and 1 using RW) that required min-mod A to correct. Educated pt on slowing down gait to become more safe and prevent tripping. Pt is aware of left lean throughout treatment but is unable to self correct. At this time, patient is unsafe to go home due to multiple LOB episodes during treatment today. PT switching recommendation to CIR in order to reduce fall risk prior to going home.    Follow Up Recommendations  CIR     Equipment Recommendations  Other (comment) (TBA at next venue)    Recommendations for Other Services Rehab consult     Precautions / Restrictions Precautions Precautions: Fall Precaution Comments: imbalance to L    Mobility  Bed Mobility Overal bed mobility: Independent              General bed mobility comments: pt able to get up with no cuing or assistance.  Transfers Overall transfer level: Needs assistance Equipment used: None Transfers: Sit to/from Stand Sit to Stand: Supervision         General transfer comment: supervision for safety due to pt's unsteadiness in standing. Pt able to stand with no assistance from AD or SPTA.  Ambulation/Gait Ambulation/Gait assistance: Min assist;Mod assist Ambulation Distance (Feet): 250 Feet Assistive device: Rolling walker (2 wheeled);Quad cane Gait Pattern/deviations: Step-through pattern;Decreased step length - right;Decreased step length - left;Narrow base of support     General Gait Details: initially began with straight cane but tansitioned back to RW for patient safety. Pt experienced three episodes of LOB with min-mod A to correct. Encouraged pt to progress slowly after getting up to avoid LOB. Pt required verbal cuing to widen BOS and to correct left lean. Pt aware of defecit but unable to correct.; expressed frustration of deficits at end of tx.    Stairs         General stair comments: unable to perform due to limited session time  Wheelchair Mobility    Modified Rankin (Stroke Patients Only) Modified Rankin (Stroke Patients Only) Pre-Morbid Rankin Score: No symptoms Modified Rankin: Moderately severe disability     Balance Overall balance assessment: Needs assistance Sitting-balance support: Feet supported Sitting balance-Leahy Scale: Good Sitting balance - Comments: pt able to perform dynamic sitting balance with no LOB noted.      Standing balance-Leahy Scale: Poor  Standing balance comment: pt experienced LOB with both static and dynamic balance. Unable to self correct                    Cognition Arousal/Alertness: Awake/alert Behavior During Therapy: WFL for tasks assessed/performed Overall Cognitive Status: Impaired/Different from baseline Area of Impairment:  Safety/judgement         Safety/Judgement: Decreased awareness of deficits     General Comments: Pt requests to wear tennis shoes to help prevent falling. Progressed awareness of deficits throughout tx.     Exercises      General Comments        Pertinent Vitals/Pain Pain Assessment: No/denies pain (c/o pain in eye from earlier today but subsided prior to PT tx.)    Home Living                      Prior Function            PT Goals (current goals can now be found in the care plan section) Acute Rehab PT Goals Patient Stated Goal: wants to get better PT Goal Formulation: With patient Potential to Achieve Goals: Good Progress towards PT goals: Progressing toward goals    Frequency    Min 4X/week      PT Plan Discharge plan needs to be updated    Co-evaluation             End of Session Equipment Utilized During Treatment: Gait belt Activity Tolerance: Patient tolerated treatment well Patient left: in bed;Other (comment) (imaging arrived for further testing. )     Time: 8119-1478 PT Time Calculation (min) (ACUTE ONLY): 16 min  Charges:  $Gait Training: 8-22 mins                    G Codes:      Florestine Avers January 13, 2017, 11:40 AM Kerrin Mo, SPTA Pager [phone]

## 2017-01-02 ENCOUNTER — Inpatient Hospital Stay (HOSPITAL_COMMUNITY)
Admission: RE | Admit: 2017-01-02 | Discharge: 2017-01-10 | DRG: 071 | Disposition: A | Discharge status: Home Health | Payer: BLUE CROSS/BLUE SHIELD | Source: Intra-hospital | Attending: Physical Medicine & Rehabilitation | Admitting: Physical Medicine & Rehabilitation

## 2017-01-02 ENCOUNTER — Inpatient Hospital Stay (HOSPITAL_COMMUNITY): Payer: BLUE CROSS/BLUE SHIELD

## 2017-01-02 DIAGNOSIS — I69393 Ataxia following cerebral infarction: Secondary | ICD-10-CM | POA: Diagnosis not present

## 2017-01-02 DIAGNOSIS — Z79899 Other long term (current) drug therapy: Secondary | ICD-10-CM

## 2017-01-02 DIAGNOSIS — G463 Brain stem stroke syndrome: Principal | ICD-10-CM | POA: Diagnosis present

## 2017-01-02 DIAGNOSIS — D72829 Elevated white blood cell count, unspecified: Secondary | ICD-10-CM | POA: Diagnosis present

## 2017-01-02 DIAGNOSIS — Z91030 Bee allergy status: Secondary | ICD-10-CM

## 2017-01-02 DIAGNOSIS — R112 Nausea with vomiting, unspecified: Secondary | ICD-10-CM

## 2017-01-02 DIAGNOSIS — E1159 Type 2 diabetes mellitus with other circulatory complications: Secondary | ICD-10-CM | POA: Diagnosis not present

## 2017-01-02 DIAGNOSIS — E1122 Type 2 diabetes mellitus with diabetic chronic kidney disease: Secondary | ICD-10-CM | POA: Diagnosis present

## 2017-01-02 DIAGNOSIS — Z7982 Long term (current) use of aspirin: Secondary | ICD-10-CM

## 2017-01-02 DIAGNOSIS — Z7951 Long term (current) use of inhaled steroids: Secondary | ICD-10-CM

## 2017-01-02 DIAGNOSIS — Z87891 Personal history of nicotine dependence: Secondary | ICD-10-CM | POA: Diagnosis not present

## 2017-01-02 DIAGNOSIS — N183 Chronic kidney disease, stage 3 (moderate): Secondary | ICD-10-CM | POA: Diagnosis not present

## 2017-01-02 DIAGNOSIS — IMO0002 Reserved for concepts with insufficient information to code with codable children: Secondary | ICD-10-CM

## 2017-01-02 DIAGNOSIS — E782 Mixed hyperlipidemia: Secondary | ICD-10-CM | POA: Diagnosis present

## 2017-01-02 DIAGNOSIS — E871 Hypo-osmolality and hyponatremia: Secondary | ICD-10-CM | POA: Diagnosis present

## 2017-01-02 DIAGNOSIS — E785 Hyperlipidemia, unspecified: Secondary | ICD-10-CM | POA: Diagnosis present

## 2017-01-02 DIAGNOSIS — Z7984 Long term (current) use of oral hypoglycemic drugs: Secondary | ICD-10-CM | POA: Diagnosis not present

## 2017-01-02 DIAGNOSIS — I63112 Cerebral infarction due to embolism of left vertebral artery: Secondary | ICD-10-CM | POA: Diagnosis not present

## 2017-01-02 DIAGNOSIS — E1165 Type 2 diabetes mellitus with hyperglycemia: Secondary | ICD-10-CM | POA: Diagnosis present

## 2017-01-02 DIAGNOSIS — K219 Gastro-esophageal reflux disease without esophagitis: Secondary | ICD-10-CM | POA: Diagnosis present

## 2017-01-02 DIAGNOSIS — I10 Essential (primary) hypertension: Secondary | ICD-10-CM | POA: Diagnosis present

## 2017-01-02 DIAGNOSIS — N182 Chronic kidney disease, stage 2 (mild): Secondary | ICD-10-CM | POA: Diagnosis present

## 2017-01-02 DIAGNOSIS — I639 Cerebral infarction, unspecified: Secondary | ICD-10-CM

## 2017-01-02 LAB — GLUCOSE, CAPILLARY
GLUCOSE-CAPILLARY: 164 mg/dL — AB (ref 65–99)
GLUCOSE-CAPILLARY: 235 mg/dL — AB (ref 65–99)
Glucose-Capillary: 248 mg/dL — ABNORMAL HIGH (ref 65–99)
Glucose-Capillary: 289 mg/dL — ABNORMAL HIGH (ref 65–99)

## 2017-01-02 LAB — HEMOGLOBIN A1C
HEMOGLOBIN A1C: 9.5 % — AB (ref 4.8–5.6)
Mean Plasma Glucose: 226 mg/dL

## 2017-01-02 MED ORDER — SENNOSIDES-DOCUSATE SODIUM 8.6-50 MG PO TABS: 1.0000 | ORAL_TABLET | Freq: Every evening | ORAL | Status: DC | PRN

## 2017-01-02 MED ORDER — ACETAMINOPHEN 325 MG PO TABS: 325.0000 mg | ORAL_TABLET | ORAL | Status: DC | PRN

## 2017-01-02 MED ORDER — FLEET ENEMA 7-19 GM/118ML RE ENEM
1.0000 | ENEMA | Freq: Once | RECTAL | Status: DC | PRN
Start: 2017-01-02 — End: 2017-01-10

## 2017-01-02 MED ORDER — INSULIN ASPART 100 UNIT/ML ~~LOC~~ SOLN
0.0000 [IU] | Freq: Every day | SUBCUTANEOUS | Status: DC
  Administered 2017-01-02: 2 [IU] via SUBCUTANEOUS
  Administered 2017-01-08: 3 [IU] via SUBCUTANEOUS
  Administered 2017-01-09: 2 [IU] via SUBCUTANEOUS

## 2017-01-02 MED ORDER — POLYETHYLENE GLYCOL 3350 17 G PO PACK
17.0000 g | PACK | Freq: Every day | ORAL | Status: DC | PRN
  Administered 2017-01-07: 17 g via ORAL
  Filled 2017-01-02: qty 1

## 2017-01-02 MED ORDER — SENNOSIDES-DOCUSATE SODIUM 8.6-50 MG PO TABS
2.0000 | ORAL_TABLET | Freq: Every day | ORAL | Status: DC
  Administered 2017-01-02 – 2017-01-09 (×8): 2 via ORAL
  Filled 2017-01-02 (×8): qty 2

## 2017-01-02 MED ORDER — TRAZODONE HCL 50 MG PO TABS
25.0000 mg | ORAL_TABLET | Freq: Every evening | ORAL | Status: DC | PRN
  Administered 2017-01-02 – 2017-01-07 (×2): 50 mg via ORAL
  Filled 2017-01-02 (×2): qty 1

## 2017-01-02 MED ORDER — INSULIN ASPART 100 UNIT/ML ~~LOC~~ SOLN
0.0000 [IU] | Freq: Three times a day (TID) | SUBCUTANEOUS | Status: DC
  Administered 2017-01-02 – 2017-01-03 (×2): 4 [IU] via SUBCUTANEOUS
  Administered 2017-01-03: 11 [IU] via SUBCUTANEOUS
  Administered 2017-01-03 – 2017-01-04 (×2): 7 [IU] via SUBCUTANEOUS
  Administered 2017-01-04: 11 [IU] via SUBCUTANEOUS
  Administered 2017-01-04 – 2017-01-05 (×3): 7 [IU] via SUBCUTANEOUS
  Administered 2017-01-05 – 2017-01-06 (×2): 4 [IU] via SUBCUTANEOUS
  Administered 2017-01-06: 7 [IU] via SUBCUTANEOUS
  Administered 2017-01-06 – 2017-01-08 (×5): 4 [IU] via SUBCUTANEOUS
  Administered 2017-01-08: 3 [IU] via SUBCUTANEOUS
  Administered 2017-01-09: 4 [IU] via SUBCUTANEOUS
  Administered 2017-01-09: 11 [IU] via SUBCUTANEOUS
  Administered 2017-01-09: 4 [IU] via SUBCUTANEOUS
  Administered 2017-01-10: 3 [IU] via SUBCUTANEOUS
  Administered 2017-01-10: 4 [IU] via SUBCUTANEOUS

## 2017-01-02 MED ORDER — SORBITOL 70 % SOLN
45.0000 mL | Freq: Once | Status: AC
  Administered 2017-01-02: 45 mL via ORAL
  Filled 2017-01-02: qty 60

## 2017-01-02 MED ORDER — INSULIN GLARGINE 100 UNIT/ML ~~LOC~~ SOLN: 35.0000 [IU] | Freq: Every day | SUBCUTANEOUS | 11 refills | Status: DC

## 2017-01-02 MED ORDER — CLOPIDOGREL BISULFATE 75 MG PO TABS
75.0000 mg | ORAL_TABLET | Freq: Every day | ORAL | Status: DC
  Administered 2017-01-03 – 2017-01-10 (×8): 75 mg via ORAL
  Filled 2017-01-02 (×8): qty 1

## 2017-01-02 MED ORDER — ENOXAPARIN SODIUM 40 MG/0.4ML ~~LOC~~ SOLN
40.0000 mg | SUBCUTANEOUS | Status: DC
  Administered 2017-01-02 – 2017-01-08 (×7): 40 mg via SUBCUTANEOUS
  Filled 2017-01-02 (×8): qty 0.4

## 2017-01-02 MED ORDER — ASPIRIN EC 81 MG PO TBEC
81.0000 mg | DELAYED_RELEASE_TABLET | Freq: Every day | ORAL | Status: DC
  Administered 2017-01-03 – 2017-01-10 (×8): 81 mg via ORAL
  Filled 2017-01-02 (×9): qty 1

## 2017-01-02 MED ORDER — INSULIN GLARGINE 100 UNIT/ML ~~LOC~~ SOLN
35.0000 [IU] | Freq: Every day | SUBCUTANEOUS | Status: DC
  Filled 2017-01-02: qty 0.35

## 2017-01-02 MED ORDER — GUAIFENESIN-DM 100-10 MG/5ML PO SYRP: 5.0000 mL | ORAL_SOLUTION | Freq: Four times a day (QID) | ORAL | Status: DC | PRN

## 2017-01-02 MED ORDER — ONDANSETRON HCL 4 MG PO TABS
4.0000 mg | ORAL_TABLET | Freq: Three times a day (TID) | ORAL | Status: DC | PRN
  Administered 2017-01-02 – 2017-01-06 (×6): 4 mg via ORAL
  Filled 2017-01-02 (×7): qty 1

## 2017-01-02 MED ORDER — METFORMIN HCL ER 500 MG PO TB24
500.0000 mg | ORAL_TABLET | Freq: Two times a day (BID) | ORAL | Status: DC
  Administered 2017-01-02 – 2017-01-04 (×4): 500 mg via ORAL
  Filled 2017-01-02 (×4): qty 1

## 2017-01-02 MED ORDER — SODIUM CHLORIDE 0.9 % IV BOLUS (SEPSIS)
250.0000 mL | Freq: Once | INTRAVENOUS | Status: AC
  Administered 2017-01-02: 250 mL via INTRAVENOUS

## 2017-01-02 MED ORDER — BISACODYL 10 MG RE SUPP: 10.0000 mg | Freq: Every day | RECTAL | Status: DC | PRN

## 2017-01-02 MED ORDER — INSULIN GLARGINE 100 UNIT/ML ~~LOC~~ SOLN
15.0000 [IU] | Freq: Once | SUBCUTANEOUS | Status: AC
Start: 2017-01-02 — End: 2017-01-02
  Administered 2017-01-02: 15 [IU] via SUBCUTANEOUS
  Filled 2017-01-02: qty 0.15

## 2017-01-02 MED ORDER — ALUM & MAG HYDROXIDE-SIMETH 200-200-20 MG/5ML PO SUSP
30.0000 mL | ORAL | Status: DC | PRN
  Administered 2017-01-07 (×2): 30 mL via ORAL
  Filled 2017-01-02 (×2): qty 30

## 2017-01-02 MED ORDER — DIPHENHYDRAMINE HCL 12.5 MG/5ML PO ELIX: 12.5000 mg | ORAL_SOLUTION | Freq: Four times a day (QID) | ORAL | Status: DC | PRN

## 2017-01-02 MED ORDER — ATORVASTATIN CALCIUM 40 MG PO TABS
40.0000 mg | ORAL_TABLET | Freq: Every day | ORAL | Status: DC
  Administered 2017-01-02 – 2017-01-09 (×7): 40 mg via ORAL
  Filled 2017-01-02 (×8): qty 1

## 2017-01-02 NOTE — H&P (Signed)
Physical Medicine and Rehabilitation Admission H&P    Chief Complaint  Patient presents with  . Dizziness and ataxia    HPI: Nicholas Mejia is a 46 y.o. male with history of HTN, T2DM, tobacco use (quit 6 weeks ago) who was admitted to [facility] on 12/31/16 with left facial numbness, HA and unsteady gait. 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  2D echo with contrast revealed EF 55-60% and no wall abnormality or thrombus.  Dr. Leonie Man recommended ASA and plavix for thrombotic stroke due to L-VA stenosis/large vessel disease. He reported worsening of symptoms with diplopia and nausea with activity this am. CT head stable and he was treated with fluid bolus.  Therapy ongoing and patient with ataxia affecting balance and gait with balance deficits and poor safety. CIR was  recommended for follow up therapy.    Review of Systems  Constitutional: Positive for malaise/fatigue (for past 6 months).  HENT: Negative for hearing loss and tinnitus.   Eyes: Positive for double vision. Negative for blurred vision.  Respiratory: Negative for cough and shortness of breath.   Cardiovascular: Negative for chest pain and palpitations.  Gastrointestinal: Positive for nausea. Negative for constipation, heartburn and vomiting.  Genitourinary: Negative for dysuria and urgency.  Musculoskeletal: Negative for back pain, joint pain and myalgias.  Skin: Negative for itching and rash.  Neurological: Positive for sensory change (RUE/RLE with loss of cold sensation and Left facial numbness). Negative for focal weakness.  Psychiatric/Behavioral: Negative for memory loss. The patient does not have insomnia.      Past Medical History:  Diagnosis Date  . Diabetes mellitus without complication [facility])     Past Surgical History:    Procedure Laterality Date  . EXTERNAL EAR SURGERY      Family History  Problem Relation Age of Onset  . Heart attack Father   . Gout Brother     Social History:  Lives alone--. Work as Financial trader for [employer]. He reports that he has quit smoking 6 weeks ago.  He does not have any smokeless tobacco history on file. He reports that he drinks a glass of wine daily. He reports that he does not use drugs.    Allergies  Allergen Reactions  . Bee Venom Anaphylaxis and Swelling    Medications Prior to Admission  Medication Sig Dispense Refill  . aspirin EC 81 MG tablet Take 162 mg by mouth daily.    . chlorthalidone (HYGROTON) 25 MG tablet Take 25 mg by mouth daily.    . fluticasone (FLONASE) 50 MCG/ACT nasal spray Place 1 spray into both nostrils daily.    . fluticasone-salmeterol (ADVAIR HFA) 115-21 MCG/ACT inhaler Inhale 2 puffs into the lungs 2 (two) times daily as needed (shortness of breath).    [name] guaiFENesin-codeine 100-10 MG/5ML syrup Take 5 mLs by mouth 3 (three) times daily as needed for cough.    . metFORMIN (GLUCOPHAGE-XR) 500 MG 24 hr tablet Take 500 mg by mouth See admin instructions. Take 1 tablet every morning, takes another tablet in the evening if remembers    . montelukast (SINGULAIR) 10 MG tablet Take 10 mg by mouth at bedtime.    . Turmeric 500 MG TABS Take 1,000 mg by mouth 2 (two) times daily.      Home: Home Living Family/patient expects to be  discharged to:: Private residence Living Arrangements: Alone Available Help at Discharge: Friend(s), Available PRN/intermittently Type of Home: Apartment Home Access: Stairs to enter Technical brewer of Steps: flight Entrance Stairs-Rails: Right Home Layout: One level Bathroom Shower/Tub: Chiropodist: Standard Home Equipment: None Additional Comments: states mom can come from [place] to stay temporarily  Lives With: Alone   Functional History: Prior Function Level of  Independence: Independent Comments: worked for [employer] on [address] (states walked 3 miles a day)  Functional Status:  Mobility: Bed Mobility Overal bed mobility: Independent General bed mobility comments: pt able to get up with no cuing or assistance. Transfers Overall transfer level: Needs assistance Equipment used: None Transfers: Sit to/from Stand, Stand Pivot Transfers Sit to Stand: Min guard Stand pivot transfers: Min assist General transfer comment: min A without UE support  Ambulation/Gait Ambulation/Gait assistance: Min assist, Mod assist Ambulation Distance (Feet): 250 Feet Assistive device: Rolling walker (2 wheeled), Quad cane Gait Pattern/deviations: Step-through pattern, Decreased step length - right, Decreased step length - left, Narrow base of support General Gait Details: initially began with straight cane but tansitioned back to RW for patient safety. Pt experienced three episodes of LOB with min-mod A to correct. Encouraged pt to progress slowly after getting up to avoid LOB. Pt required verbal cuing to widen BOS and to correct left lean. Pt aware of defecit but unable to correct.; expressed frustration of deficits at end of tx.  Stairs: Yes Stairs assistance: Min guard Stair Management: One rail Right, Alternating pattern, Step to pattern, Sideways, Forwards Number of Stairs: 10 General stair comments: unable to perform due to limited session time    ADL: ADL Overall ADL's : Needs assistance/impaired Eating/Feeding: Independent Grooming: Wash/dry hands, Wash/dry face, Brushing hair, Minimal assistance, Standing Grooming Details (indicate cue type and reason): pt static standing able to complete task but requires UE support Upper Body Bathing: Min guard Lower Body Bathing: Minimal assistance, Sit to/from stand Lower Body Bathing Details (indicate cue type and reason): without UE support, mod A to access feet in standing without UE support which  Upper  Body Dressing : Set up, Sitting Lower Body Dressing: Minimal assistance, Sit to/from stand Lower Body Dressing Details (indicate cue type and reason): without UE support  Toilet Transfer: Moderate assistance, Ambulation, Comfort height toilet, Grab bars, RW Toilet Transfer Details (indicate cue type and reason): ambulating without AD  Toileting- Clothing Manipulation and Hygiene: Minimal assistance, Sit to/from stand Toileting - Clothing Manipulation Details (indicate cue type and reason): without UE support  Tub/ Banker: Minimal assistance, Engineer, agricultural Details (indicate cue type and reason): Pt instructed in options for shower seats  Functional mobility during ADLs: Moderate assistance General ADL Comments: Pt with LOB during ADLs without UE support   Cognition: Cognition Overall Cognitive Status: Within Functional Limits for tasks assessed Arousal/Alertness: Awake/alert Orientation Level: Oriented X4 Attention: Sustained Sustained Attention: Appears intact Memory: Impaired Memory Impairment: Retrieval deficit, Decreased short term memory Decreased Short Term Memory: Verbal basic Awareness: Appears intact Problem Solving: Appears intact Safety/Judgment: Appears intact Cognition Arousal/Alertness: Awake/alert Behavior During Therapy: WFL for tasks assessed/performed Overall Cognitive Status: Within Functional Limits for tasks assessed Area of Impairment: Safety/judgement Safety/Judgement: Decreased awareness of deficits General Comments: Pt requests to wear tennis shoes to help prevent falling. Progressed awareness of deficits throughout tx.    Blood pressure (!) 170/80, pulse 88, temperature 98.8 F (37.1 C), temperature source Oral, resp. rate 19, height _0  (1.88 m), weight (!) 147.9 kg (  326 lb), SpO2 95 %. Physical Exam  Nursing note and vitals reviewed. Constitutional: He is oriented to person, place, and time. He appears well-developed and  well-nourished.  HENT:  Head: Normocephalic and atraumatic.  Mouth/Throat: Oropharynx is clear and moist.  Eyes: Conjunctivae and EOM are normal. Pupils are equal, round, and reactive to light.  Neck: Normal range of motion. Neck supple.  Cardiovascular: Normal rate and regular rhythm.   Respiratory: Effort normal and breath sounds normal. No stridor.  GI: Soft. Bowel sounds are normal. He exhibits no distension.  Musculoskeletal: Normal range of motion.  Neurological: He is alert and oriented to person, place, and time. A cranial nerve deficit is present.  left hemiataxia, right hemisensory deficits, left facial dysesthesias  Skin: Skin is warm and dry.  Psychiatric: He has a normal mood and affect. His behavior is normal. Thought content normal.    Results for orders placed or performed during the hospital encounter of 12/30/16 (from the past 48 hour(s))  Glucose, capillary     Status: Abnormal   Collection Time: 12/31/16 11:10 AM  Result Value Ref Range   Glucose-Capillary 356 (H) 65 - 99 mg/dL   Comment 1 Notify RN   Glucose, capillary     Status: Abnormal   Collection Time: 12/31/16  4:40 PM  Result Value Ref Range   Glucose-Capillary 252 (H) 65 - 99 mg/dL  Glucose, capillary     Status: Abnormal   Collection Time: 12/31/16  9:25 PM  Result Value Ref Range   Glucose-Capillary 266 (H) 65 - 99 mg/dL   Comment 1 Notify RN    Comment 2 Document in Chart   Hemoglobin A1c     Status: Abnormal   Collection Time: 01/01/17  2:20 AM  Result Value Ref Range   Hgb A1c MFr Bld 9.5 (H) 4.8 - 5.6 %    Comment: (NOTE)         Pre-diabetes: 5.7 - 6.4         Diabetes: >6.4         Glycemic control for adults with diabetes: <7.0    Mean Plasma Glucose 226 mg/dL    Comment: (NOTE) Performed At: [facility] [place], [place] [id number] Lindon Romp MD PQ:[id number]   Lipid panel     Status: Abnormal   Collection Time: 01/01/17  2:20 AM  Result Value Ref  Range   Cholesterol 234 (H) 0 - 200 mg/dL   Triglycerides 166 (H) <150 mg/dL   HDL 33 (L) >40 mg/dL   Total CHOL/HDL Ratio 7.1 RATIO   VLDL 33 0 - 40 mg/dL   LDL Cholesterol 168 (H) 0 - 99 mg/dL    Comment:        Total Cholesterol/HDL:CHD Risk Coronary Heart Disease Risk Table                     Men   Women  1/2 Average Risk   3.4   3.3  Average Risk       5.0   4.4  2 X Average Risk   9.6   7.1  3 X Average Risk  23.4   11.0        Use the calculated Patient Ratio above and the CHD Risk Table to determine the patient's CHD Risk.        ATP III CLASSIFICATION (LDL):  <100     mg/dL   Optimal  100-129  mg/dL   Near  or Above                    Optimal  130-159  mg/dL   Borderline  160-189  mg/dL   High  >190     mg/dL   Very High   CBC     Status: Abnormal   Collection Time: 01/01/17  2:20 AM  Result Value Ref Range   WBC 12.8 (H) 4.0 - 10.5 K/uL   RBC 5.09 4.22 - 5.81 MIL/uL   Hemoglobin 14.9 13.0 - 17.0 g/dL   HCT 44.3 39.0 - 52.0 %   MCV 87.0 78.0 - 100.0 fL   MCH 29.3 26.0 - 34.0 pg   MCHC 33.6 30.0 - 36.0 g/dL   RDW 13.1 11.5 - 15.5 %   Platelets 261 150 - 400 K/uL  Comprehensive metabolic panel     Status: Abnormal   Collection Time: 01/01/17  2:20 AM  Result Value Ref Range   Sodium 131 (L) 135 - 145 mmol/L   Potassium 3.9 3.5 - 5.1 mmol/L   Chloride 92 (L) 101 - 111 mmol/L   CO2 30 22 - 32 mmol/L   Glucose, Bld 276 (H) 65 - 99 mg/dL   BUN 12 6 - 20 mg/dL   Creatinine, Ser 1.10 0.61 - 1.24 mg/dL   Calcium 8.4 (L) 8.9 - 10.3 mg/dL   Total Protein 7.2 6.5 - 8.1 g/dL   Albumin 3.1 (L) 3.5 - 5.0 g/dL   AST 15 15 - 41 U/L   ALT 15 (L) 17 - 63 U/L   Alkaline Phosphatase 54 38 - 126 U/L   Total Bilirubin 0.5 0.3 - 1.2 mg/dL   GFR calc non Af Amer >60 >60 mL/min   GFR calc Af Amer >60 >60 mL/min    Comment: (NOTE) The eGFR has been calculated using the CKD EPI equation. This calculation has not been validated in all clinical situations. eGFR's persistently  <60 mL/min signify possible Chronic Kidney Disease.    Anion gap 9 5 - 15  Glucose, capillary     Status: Abnormal   Collection Time: 01/01/17  6:49 AM  Result Value Ref Range   Glucose-Capillary 237 (H) 65 - 99 mg/dL   Comment 1 Notify RN    Comment 2 Document in Chart   Glucose, capillary     Status: Abnormal   Collection Time: 01/01/17 11:36 AM  Result Value Ref Range   Glucose-Capillary 278 (H) 65 - 99 mg/dL   Comment 1 Notify RN    Comment 2 Document in Chart   Glucose, capillary     Status: Abnormal   Collection Time: 01/01/17  5:14 PM  Result Value Ref Range   Glucose-Capillary 260 (H) 65 - 99 mg/dL   Comment 1 Notify RN    Comment 2 Document in Chart   Glucose, capillary     Status: Abnormal   Collection Time: 01/01/17  9:59 PM  Result Value Ref Range   Glucose-Capillary 309 (H) 65 - 99 mg/dL   Comment 1 Notify RN    Comment 2 Document in Chart   Glucose, capillary     Status: Abnormal   Collection Time: 01/02/17  6:03 AM  Result Value Ref Range   Glucose-Capillary 248 (H) 65 - 99 mg/dL   Comment 1 Notify RN    Comment 2 Document in Chart    Dg Chest 2 View  Result Date: 12/31/2016 CLINICAL DATA:  Fever EXAM: CHEST  2 VIEW COMPARISON:  None. FINDINGS: Heart is borderline in size. Lingular atelectasis or scarring. Right lung is clear. No effusions or acute bony abnormality. IMPRESSION: Lingular atelectasis or scarring. Electronically Signed   By: Rolm Baptise M.D.   On: 12/31/2016 13:48       Medical Problem List and Plan: 1.  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  DVT Prophylaxis/Anticoagulation: Pharmaceutical: Lovenox 3. Pain Management: N/A 4. Mood: LCSW to follow for evaluation and support.  5. Neuropsych: This patient is capable of making decisions on his own behalf. 6. Skin/Wound Care: Routine pressure relief measures.  7. Fluids/Electrolytes/Nutrition: Monitor I/O. Check lytes in am.  8. HTN: Monitor BP  bid--with permissive HTN for 5-7 days. Will add medications as indicated if remains persistently high.  9. T2DM: Hgb A1C- 9.5. He reports that he has not been fully compliant with meds due to malaise X months. Monitor BS ac/hs. Continue lantus insulin for now--taper as BS improve.  Will resume Metformin BID and titrate upwards. . Use SSI for elevated BS.   10. Dyslipidemia: Now on Lipitor 11. Hyponatremia: Monitor intake. Recheck labs in am.  12. Leucocytosis: Reports recent bout of PNA. No respiratory symptoms reported. Monitor for now.  13 CKD?: SCr-1.34 at admission.  14. Nausea/Dizziness: Will order vestibular evaluation. Continue prn zofran for nausea.  15. Morbid obesity: Educated appropriate diet and on importance of weight loss to help promote overall health and function.     Post Admission Physician Evaluation: 1. 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 2. Patient is admitted to receive collaborative, interdisciplinary care between the physiatrist, rehab nursing staff, and therapy team. 3. Patient's level of medical complexity and substantial therapy needs in context of that medical necessity cannot be provided at a lesser intensity of care such as a SNF. 4. Patient has experienced substantial functional loss from his/her baseline which was documented above under the "Functional History" and "Functional Status" headings.  Judging by the patient's diagnosis, physical exam, and functional history, the patient has potential for functional progress which will result in measurable gains while on inpatient rehab.  These gains will be of substantial and practical use upon discharge  in facilitating mobility and self-care at the household level. 5. Physiatrist will provide 24 hour management of medical needs as well as oversight of the therapy plan/treatment and provide guidance as appropriate regarding the interaction of the two. 6. The Preadmission Screening has been reviewed and  patient status is unchanged unless otherwise stated above. 7. 24 hour rehab nursing will assist with bladder management, bowel management, safety, skin/wound care, disease management, medication administration, pain management and patient education  and help integrate therapy concepts, techniques,education, etc. 8. PT will assess and treat for/with: Lower extremity strength, range of motion, stamina, balance, functional mobility, safety, adaptive techniques and equipment, NMR, community reintegration.   Goals are: mod I. 9. OT will assess and treat for/with: ADL's, functional mobility, safety, upper extremity strength, adaptive techniques and equipment, NMR, community reintegration.   Goals are: mod I. Therapy may proceed with showering this patient. 10. SLP will assess and treat for/with: n/a.  Goals are: n/a. 11. Case Management and Social Worker will assess and treat for psychological issues and discharge planning. 12. Team conference will be held weekly to assess progress toward goals and to determine barriers to discharge. 13. Patient will receive at least 3 hours of therapy per day at least 5 days per week. 14. ELOS: 7-10 days  15. Prognosis:  excellent     Meredith Staggers, MD, [facility] 01/02/2017  Bary Leriche, Hershal Coria 01/02/2017

## 2017-01-02 NOTE — PMR Pre-admission (Signed)
PMR Admission Coordinator Pre-Admission Assessment  Patient: Nicholas Mejia is an 46 y.o., male MRN: [id number] DOB: Jan 15, 1971 Height: 6' 2"  (188 cm) Weight: (!) 147.9 kg (326 lb)              Insurance Information HMO:      PPO: Yes     PCP:       IPA:       80/20:       OTHER:  Group # [id number] PRIMARY: BCBS       Policy#:  [id number]      Subscriber:  Sherran Needs CM Name: Any RN      Phone#: [phone]     Fax#:   Pre-Cert#: [id number] for 7 days      Employer:  FT Benefits:  Phone #: [phone]     Name:  FT Eff. Date:  02/25/16     Deduct:  $700 (met $174.84)      Out of Pocket Max:  $4000 (met $239.63)      Life Max:  Unlimited CIR:  80%      SNF: 80%/20% Outpatient: 80%     Co-Pay: 20% Home Health: 80% with 120 visits     Co-Pay: 20% DME: 80%     Co-Pay: 20% Providers: in network  SECONDARY:  UHC      Policy#: [id number]      Subscriber:  Sherran Needs CM Name:        Phone#:       Fax#:   Pre-Cert#: Spect[employer] vision plan only - no medical benefits       Employer:   Benefits:  Phone #:       Name:   Eff. Date:       Deduct:        Out of Pocket Max:        Life Max:   CIR:        SNF:   Outpatient:       Co-Pay:   Home Health:        Co-Pay:   DME:      Co-Pay:   Emergency Contact Information Contact Information    Name Relation Home Work [place] Mother [phone]  [phone]     Current Medical History  Patient Admitting Diagnosis: Wallenberg syndrome with left hemiataxia, right hemisensory deficits, left facial dysesthesias, gait disorder   History of Present Illness: A 45 y.o.malewith history of HTN, T2DM, tobacco use (quit 6 weeks ago) who was admitted to [facility] on 12/31/16 with left facial numbness, HA and unsteady gait. 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 2D echo with contrast revealed EF 55-60% and no wall abnormality or thrombus.  Dr. Leonie Man recommended ASA and plavix for thrombotic stroke due to L-VA stenosis/large vessel disease. Therapy ongoing and patient with ataxia affecting balance and gait with balance deficits and poor safety. CIR recommended for follow up therapy.    Total: 2=NIH  Past Medical History  Past Medical History:  Diagnosis Date  . Diabetes mellitus without complication ([place])     Family History  family history includes Gout in his brother; Heart attack in his father.  Prior Rehab/Hospitalizations: No previous rehab admissions.  Has the patient had major surgery during 100 days prior to admission? No  Current Medications   Current Facility-Administered  Medications:  .  0.9 %  sodium chloride infusion, , Intravenous, Continuous, Ivor Costa, MD, Last Rate: 100 mL/hr at 12/31/16 0325 .  acetaminophen (TYLENOL) tablet 650 mg, 650 mg, Oral, Q4H PRN **OR** acetaminophen (TYLENOL) solution 650 mg, 650 mg, Per Tube, Q4H PRN **OR** acetaminophen (TYLENOL) suppository 650 mg, 650 mg, Rectal, Q4H PRN, Ivor Costa, MD .  aspirin suppository 300 mg, 300 mg, Rectal, Daily **OR** aspirin tablet 325 mg, 325 mg, Oral, Daily, Donzetta Starch, NP, 325 mg at 01/02/17 0910 .  atorvastatin (LIPITOR) tablet 40 mg, 40 mg, Oral, q1800, Ivor Costa, MD, 40 mg at 01/01/17 1738 .  clopidogrel (PLAVIX) tablet 75 mg, 75 mg, Oral, Daily, Donzetta Starch, NP, 75 mg at 01/02/17 0910 .  enoxaparin (LOVENOX) injection 40 mg, 40 mg, Subcutaneous, Q24H, Ivor Costa, MD, 40 mg at 01/02/17 0456 .  insulin aspart (novoLOG) injection 0-9 Units, 0-9 Units, Subcutaneous, TID WC, Ivor Costa, MD, 5 Units at 01/02/17 1137 .  ondansetron (ZOFRAN) injection 4 mg, 4 mg, Intravenous, Q6H PRN, Reyne Dumas, MD, 4 mg at 01/02/17 0455 .  senna-docusate (Senokot-S) tablet 1 tablet, 1 tablet, Oral, QHS PRN, Ivor Costa, MD  Patients Current Diet: Diet heart healthy/carb modified  Room service appropriate? Yes; Fluid consistency: Thin Diet - low sodium heart healthy  Precautions / Restrictions Precautions Precautions: Fall Precaution Comments: imbalance to left during mobility.   Has the patient had 2 or more falls or a fall with injury in the past year?No  Prior Activity Level Community (5-7x/wk): Went out daily.  worked [employer], was driving.  Walked 3 miles a day in his job.  Home Assistive Devices / Equipment Home Assistive Devices/Equipment: None Home Equipment: None  Prior Device Use: Indicate devices/aids used by the patient prior to current illness, exacerbation or injury? None  Prior Functional Level Prior Function Level of Independence: Independent Comments: worked for [employer] on [address] (states walked 3 miles a day)  Self Care: Did the patient need help bathing, dressing, using the toilet or eating?  Independent  Indoor Mobility: Did the patient need assistance with walking from room to room (with or without device)? Independent  Stairs: Did the patient need assistance with internal or external stairs (with or without device)? Independent  Functional Cognition: Did the patient need help planning regular tasks such as shopping or remembering to take medications? Independent  Current Functional Level Cognition  Arousal/Alertness: Awake/alert Overall Cognitive Status: Impaired/Different from baseline Orientation Level: Oriented X4 Safety/Judgement: Decreased awareness of deficits General Comments: Per nursing night shift, pt was put to be independent in room; he is unsafe for mobiility without assistance. He is a high risk of falling due to decrease awareness of defecits since yesterday.  Attention: Sustained Sustained Attention: Appears intact Memory: Impaired Memory Impairment: Retrieval deficit, Decreased short term memory Decreased Short Term Memory: Verbal basic Awareness: Appears intact Problem Solving: Appears  intact Safety/Judgment: Appears intact    Extremity Assessment (includes Sensation/Coordination)  Upper Extremity Assessment: Defer to OT evaluation  Lower Extremity Assessment: Overall WFL for tasks assessed    ADLs  Overall ADL's : Needs assistance/impaired Eating/Feeding: Independent Grooming: Wash/dry hands, Wash/dry face, Brushing hair, Minimal assistance, Standing Grooming Details (indicate cue type and reason): pt static standing able to complete task but requires UE support Upper Body Bathing: Min guard Lower Body Bathing: Minimal assistance, Sit to/from stand Lower Body Bathing Details (indicate cue type and reason): without UE support, mod A to access feet in standing without UE support  which  Upper Body Dressing : Set up, Sitting Lower Body Dressing: Minimal assistance, Sit to/from stand Lower Body Dressing Details (indicate cue type and reason): without UE support  Toilet Transfer: Moderate assistance, Ambulation, Comfort height toilet, Grab bars, RW Toilet Transfer Details (indicate cue type and reason): ambulating without AD  Toileting- Clothing Manipulation and Hygiene: Minimal assistance, Sit to/from stand Toileting - Clothing Manipulation Details (indicate cue type and reason): without UE support  Tub/ Banker: Minimal assistance, Engineer, agricultural Details (indicate cue type and reason): Pt instructed in options for shower seats  Functional mobility during ADLs: Moderate assistance General ADL Comments: Pt with LOB during ADLs without UE support     Mobility  Overal bed mobility: Independent General bed mobility comments: pt sitting on couch in room.     Transfers  Overall transfer level: Needs assistance Equipment used: Rolling walker (2 wheeled) Transfers: Sit to/from Stand, [employer] Transfers Sit to Stand: Min guard Stand pivot transfers: Min guard General transfer comment: required cuing for hand placement to assist with push off from  low surface. Min guard for safety.     Ambulation / Gait / Stairs / Wheelchair Mobility  Ambulation/Gait Ambulation/Gait assistance: Min assist, Mod assist Ambulation Distance (Feet): 150 Feet Assistive device: Rolling walker (2 wheeled) (Bariatric RW) Gait Pattern/deviations: Step-through pattern, Decreased step length - right, Decreased step length - left, Scissoring, Drifts right/left General Gait Details: Pt required cuing for pacing throughout treatment and awareness of foot placement due to mild scissoring. Min A mostly with cuing but required mod A at times to correct 2 episodes of LOB and mild drifting in RW. Pt complained of "not feeling right" and "feeling off balance" during session. Multiple rest breaks initiated by pt due to R diplopia.  Gait velocity: very slow, numerous rest breaks. Stairs: Yes Stairs assistance: Min guard Stair Management: One rail Right, Alternating pattern, Step to pattern, Sideways, Forwards Number of Stairs: 10 General stair comments: unable to perform due to limited session time    Posture / Balance Dynamic Sitting Balance Sitting balance - Comments: pt able to perform dynamic sitting balance with no LOB noted.  Balance Overall balance assessment: Needs assistance Sitting-balance support: Feet supported Sitting balance-Leahy Scale: Good Sitting balance - Comments: pt able to perform dynamic sitting balance with no LOB noted.  Postural control: Left lateral lean Standing balance support: No upper extremity supported Standing balance-Leahy Scale: Fair Standing balance comment: Pt static standing with min A for balance and min-mod A with RW to maintain and reposition into upright posture.  High Level Balance Comments: eyes closed feet shoulder width x 30 sec no LOB, SLS on R > 3sec, on L with minguard for safaety 10 sec, able to pick up item from floor with S, turns 360 degrees to R and L slowly with S. Tandem gait with rail and min A    Special  needs/care consideration BiPAP/CPAP No CPM No Continuous Drip IV No  Dialysis No      Life Vest No Oxygen No Special Bed No Trach Size No Wound Vac (area) No     Skin No                            Bowel mgmt: Patient reports no BM since admission, but documentation says BM 12/31/16 Bladder mgmt: Voiding in bathroom with assist and in urinal Diabetic mgmt Yes, on oral medications at home    Previous Home  Environment Living Arrangements: Alone  Lives With: Alone Available Help at Discharge: Friend(s), Available PRN/intermittently Type of Home: Apartment Home Layout: One level Home Access: Stairs to enter Entrance Stairs-Rails: Right Entrance Stairs-Number of Steps: flight Bathroom Shower/Tub: Chiropodist: Standard Home Care Services: No Additional Comments: states mom can come from [place] to stay temporarily  Discharge Living Setting Plans for Discharge Living Setting: Alone, Other (Comment) (Lives alone.) Type of Home at Discharge: Other (Comment) (Condominium) Discharge Home Layout: One level (Condo on 2nd level) Discharge Home Access: Stairs to enter Entrance Stairs-Number of Steps: 12-14 steps Does the patient have any problems obtaining your medications?: No  Social/Family/Support Systems Patient Roles: Other (Comment) (Has 1 yo mom in [place].) Contact Information: Lewayne Bunting - mom - [phone] Anticipated Caregiver: self Ability/Limitations of Caregiver: Mom lives in [place] and is disabled.  Has a few friends, but no caregiver support. Caregiver Availability: Intermittent Discharge Plan Discussed with Primary Caregiver: Yes Is Caregiver In Agreement with Plan?: Yes Does Caregiver/Family have Issues with Lodging/Transportation while Pt is in Rehab?: No  Goals/Additional Needs Patient/Family Goal for Rehab: PT/OT mod I goals Expected length of stay: 7-10 days Cultural Considerations: Christian Dietary Needs: Heart  healthy, carb modified, thin liquids Equipment Needs: TBD Pt/Family Agrees to Admission and willing to participate: Yes Program Orientation Provided & Reviewed with Pt/Caregiver Including Roles  & Responsibilities: Yes  Decrease burden of Care through IP rehab admission: N/A  Possible need for SNF placement upon discharge: Not anticipated  Patient Condition: This patient's medical and functional status has changed since the consult dated: 01/01/17 in which the Rehabilitation Physician determined and documented that the patient's condition is appropriate for intensive rehabilitative care in an inpatient rehabilitation facility. See "History of Present Illness" (above) for medical update. Functional changes are:  Currently requiring min/mod assist to ambulate 250 feet RW. Patient's medical and functional status update has been discussed with the Rehabilitation physician and patient remains appropriate for inpatient rehabilitation. Will admit to inpatient rehab today.  Preadmission Screen Completed By:  Retta Diones, 01/02/2017 1:21 PM ______________________________________________________________________   Discussed status with Dr. Naaman Plummer on 01/02/17 at 73 and received telephone approval for admission today.  Admission Coordinator:  Retta Diones, time1321/Date02/09/18

## 2017-01-02 NOTE — Care Management Note (Signed)
Case Management Note  Patient Details  Name: Nicholas Mejia MRN: [id number] Date of Birth: February 23, 1971  Subjective/Objective:                    Action/Plan: Pt discharging to CIR today. No further needs per CM.  Expected Discharge Date:  01/02/17               Expected Discharge Plan:  IP Rehab Facility  In-House Referral:     Discharge planning Services     Post Acute Care Choice:    Choice offered to:     DME Arranged:    DME Agency:     HH Arranged:    HH Agency:     Status of Service:  Completed, signed off  If discussed at [employer] Meetings, dates discussed:    Additional Comments:  Nicholas BaloKelli F Shondale Quinley, RN 01/02/2017, 3:06 PM

## 2017-01-02 NOTE — Progress Notes (Signed)
Physical Therapy Treatment Patient Details Name: Nicholas Mejia Anton MRN: [id number] DOB: 1971/03/09 Today's Date: 01/02/2017    History of Present Illness 46 yo male admitted with L facial numbness, HA and gait instability. 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  PMH positive for DM, HTN, and tobacco use.     PT Comments    Pt struggles to be aware of deficits and is limited by vision; c/o diplopia during mobility with SPTA making it difficult to progress ambulation, stairs and increase exercise tolerance today. Pt presents with multi-directional nystagmus and diplopia during visual tracking in sitting. Multiple bouts of LOB within RW, requiring mod A from SPTA to recover in order to prevent falling. Pt unaware of midline during gait, causing mild left lateral lean and scissoring throughout. Pt continues to be a fall risk during mobility and is unsafe to return home due to not having full-time support.  PT still recommending CIR to address his multiple deficits preventing functional indepedence prior to returning home.    Follow Up Recommendations  CIR     Equipment Recommendations  Other (comment) (TBA)    Recommendations for Other Services Rehab consult     Precautions / Restrictions Precautions Precautions: Fall Precaution Comments: imbalance to left during mobility.    Mobility  Bed Mobility               General bed mobility comments: pt sitting on couch in room.   Transfers Overall transfer level: Needs assistance Equipment used: Rolling walker (2 wheeled) Transfers: Sit to/from UGI CorporationStand;Stand Pivot Transfers Sit to Stand: Min guard Stand pivot transfers: Min guard       General transfer comment: required cuing for hand placement to assist with push off from low surface. Min guard for safety.   Ambulation/Gait Ambulation/Gait assistance: Min assist;Mod assist Ambulation Distance (Feet): 150 Feet Assistive device:  Rolling walker (2 wheeled) (Bariatric RW) Gait Pattern/deviations: Step-through pattern;Decreased step length - right;Decreased step length - left;Scissoring;Drifts right/left Gait velocity: very slow, numerous rest breaks.   General Gait Details: Pt required cuing for pacing throughout treatment and awareness of foot placement due to mild scissoring. Min A mostly with cuing but required mod A at times to correct 2 episodes of LOB and mild drifting in RW. Pt complained of "not feeling right" and "feeling off balance" during session. Multiple rest breaks initiated by pt due to R diplopia.    Stairs            Wheelchair Mobility    Modified Rankin (Stroke Patients Only) Modified Rankin (Stroke Patients Only) Pre-Morbid Rankin Score: No symptoms Modified Rankin: Moderately severe disability     Balance Overall balance assessment: Needs assistance Sitting-balance support: Feet supported Sitting balance-Leahy Scale: Good         Standing balance comment: Pt static standing with min A for balance and min-mod A with RW to maintain and reposition into upright posture.                     Cognition Arousal/Alertness: Awake/alert Behavior During Therapy: WFL for tasks assessed/performed Overall Cognitive Status: Impaired/Different from baseline Area of Impairment: Safety/judgement         Safety/Judgement: Decreased awareness of deficits     General Comments: Per nursing night shift, pt was put to be independent in room; he is unsafe for mobiility without assistance. He is a high risk of falling due to decrease awareness of defecits since yesterday.     Exercises  General Comments        Pertinent Vitals/Pain Pain Assessment: No/denies pain    Home Living                      Prior Function            PT Goals (current goals can now be found in the care plan section) Acute Rehab PT Goals Patient Stated Goal: wants to go CIR PT Goal  Formulation: With patient Potential to Achieve Goals: Fair Progress towards PT goals: Progressing toward goals (very slow; limited by vision and coordination/ balance. )    Frequency    Min 4X/week      PT Plan Current plan remains appropriate    Co-evaluation             End of Session Equipment Utilized During Treatment: Gait belt Activity Tolerance: Patient limited by fatigue;Other (comment) (limited by vision, balance, coordination and endurance)       Time: 1610-9604 PT Time Calculation (min) (ACUTE ONLY): 23 min  Charges:  $Gait Training: 8-22 mins $Therapeutic Activity: 8-22 mins                    G Codes:      [facility] January 13, 2017, 11:57 AM Kerrin Mo, SPTA Pager [phone]

## 2017-01-02 NOTE — Progress Notes (Signed)
Rehab admissions - An additional bed has become available on inpatient rehab today.  I will admit patient today.  Call me for questions.  #[phone]

## 2017-01-02 NOTE — Progress Notes (Signed)
RN notified by PT that pt was complaining of being dizzy, sweaty and had an increase in his diplopia and nystagmus. He was also leaning more to the left than he was the day before. Vitals and CBG stable. Jasmine DecemberSharon NP and Dr. Susie CassetteAbrol both notified. 250 ns bolus, stat head ct and lay flat was ordered. Will continue to monitor

## 2017-01-02 NOTE — Progress Notes (Signed)
Patient and family were informed about rehab process ,including patient safety plan and rehab booklet.

## 2017-01-02 NOTE — Discharge Summary (Addendum)
Physician Discharge Summary  Nicholas Mejia MRN: [id number] DOB/AGE: 12-17-1970 46 y.o.  PCP: Phineas Inches, MD   Admit date: 12/30/2016 Discharge date: 01/02/2017  Discharge Diagnoses:    Principal Problem:   Stroke (cerebrum) [facility]) Active Problems:   Diabetes mellitus without complication (HCC)   AKI (acute kidney injury) ([place])   Leukocytosis   Essential hypertension    Follow-up recommendations Follow-up with PCP in 3-5 days , including all  additional recommended appointments as below Follow-up CBC, CMP in 3-5 days Follow-up with Garvin Fila, MD, in 4-6 weeks      Current Discharge Medication List    START taking these medications   Details  atorvastatin (LIPITOR) 40 MG tablet Take 1 tablet (40 mg total) by mouth daily at 6 PM. Qty: 30 tablet, Refills: 3    clopidogrel (PLAVIX) 75 MG tablet Take 1 tablet (75 mg total) by mouth daily. Qty: 30 tablet, Refills: 12    insulin glargine (LANTUS) 100 UNIT/ML injection Inject 0.35 mLs (35 Units total) into the skin daily. Qty: 10 mL, Refills: 11    insulin starter kit- syringes MISC 1 kit by Other route once. Qty: 1 kit, Refills: 0      CONTINUE these medications which have NOT CHANGED   Details  aspirin EC 81 MG tablet Take 162 mg by mouth daily.    chlorthalidone (HYGROTON) 25 MG tablet Take 25 mg by mouth daily.    fluticasone (FLONASE) 50 MCG/ACT nasal spray Place 1 spray into both nostrils daily.    fluticasone-salmeterol (ADVAIR HFA) 115-21 MCG/ACT inhaler Inhale 2 puffs into the lungs 2 (two) times daily as needed (shortness of breath).    guaiFENesin-codeine 100-10 MG/5ML syrup Take 5 mLs by mouth 3 (three) times daily as needed for cough.    montelukast (SINGULAIR) 10 MG tablet Take 10 mg by mouth at bedtime.    Turmeric 500 MG TABS Take 1,000 mg by mouth 2 (two) times daily.      STOP taking these medications     metFORMIN (GLUCOPHAGE-XR) 500 MG 24 hr tablet          Discharge Condition:  Stable   Discharge Instructions Get Medicines reviewed and adjusted: Please take all your medications with you for your next visit with your Primary MD  Please request your Primary MD to go over all hospital tests and procedure/radiological results at the follow up, please ask your Primary MD to get all Hospital records sent to his/her office.  If you experience worsening of your admission symptoms, develop shortness of breath, life threatening emergency, suicidal or homicidal thoughts you must seek medical attention immediately by calling 911 or calling your MD immediately if symptoms less severe.  You must read complete instructions/literature along with all the possible adverse reactions/side effects for all the Medicines you take and that have been prescribed to you. Take any new Medicines after you have completely understood and accpet all the possible adverse reactions/side effects.   Do not drive when taking Pain medications.   Do not take more than prescribed Pain, Sleep and Anxiety Medications  Special Instructions: If you have smoked or chewed Tobacco in the last 2 yrs please stop smoking, stop any regular Alcohol and or any Recreational drug use.  Wear Seat belts while driving.  Please note  You were cared for by a hospitalist during your hospital stay. Once you are discharged, your primary care physician will handle any further medical issues. Please note that NO REFILLS for any discharge  medications will be authorized once you are discharged, as it is imperative that you return to your primary care physician (or establish a relationship with a primary care physician if you do not have one) for your aftercare needs so that they can reassess your need for medications and monitor your lab values.  Discharge Instructions    Ambulatory referral to Neurology    Complete by:  As directed    Stroke patient. Dr. Leonie Man prefers follow up in 6 weeks   Ambulatory referral to Nutrition  and Diabetic Education    Complete by:  As directed    New to insulin A1C-pending   Diet - low sodium heart healthy    Complete by:  As directed    Increase activity slowly    Complete by:  As directed        Allergies  Allergen Reactions  . Bee Venom Anaphylaxis and Swelling      Disposition: Outpatient PT/OT   Consults: Neurology    Significant Diagnostic Studies:  Ct Angio Head W/cm &/or Wo Cm  Result Date: 12/31/2016 CLINICAL DATA:  Left facial numbness.  Left medulla infarction. EXAM: CT ANGIOGRAPHY HEAD AND NECK TECHNIQUE: Multidetector CT imaging of the head and neck was performed using the standard protocol during bolus administration of intravenous contrast. Multiplanar CT image reconstructions and MIPs were obtained to evaluate the vascular anatomy. Carotid stenosis measurements (when applicable) are obtained utilizing NASCET criteria, using the distal internal carotid diameter as the denominator. CONTRAST:  50 cc Isovue 370 COMPARISON:  MRI same day FINDINGS: CTA NECK FINDINGS Aortic arch: No atherosclerotic change, aneurysm or dissection. Right carotid system: Common carotid artery shows some circumferential wall thickening with minimal luminal diameter of 5 mm compared to an expected diameter of 8 mm. Carotid bifurcation shows atherosclerotic disease with a small region of calcified plaque and circumferential wall thickening. Minimal diameter of the ICA is 4 mm. Compared to a more distal cervical ICA diameter of 4.5 mm, this indicates a 15-20% stenosis. Cervical ICA beyond that is widely patent. Left carotid system: Common carotid artery shows a normal caliber throughout. Mild atherosclerotic change at the carotid bifurcation with a punctate calcification and mild wall thickening. No narrowing of the ICA relative to the more distal cervical ICA. Vertebral arteries: Right vertebral artery origin shows calcified plaque with stenosis estimated at 70% or greater. Beyond that, the  right vertebral artery is widely patent through the cervical region. Left vertebral artery origin shows 50% stenosis. Beyond that, the vessel is widely patent through the cervical region. Skeleton: Ordinary mild lower cervical spondylosis. Other neck: No mass or lymphadenopathy. Upper chest: Normal Review of the MIP images confirms the above findings CTA HEAD FINDINGS Anterior circulation: Both internal carotid arteries are patent through the siphon regions. There is atherosclerosis throughout the siphon regions with extensive calcification. Stenosis at the proximal siphon on the right shows a diameter of 2 mm diameter in the mid siphon on the left is also 2 mm. Anterior and middle cerebral vessels are patent bilaterally with atherosclerotic irregularity. 50% stenoses at both M1 origins. Posterior circulation: Both vertebral arteries are patent at the foramen magnum. Both posterior inferior cerebellar arteries show flow. There is severe stenosis of the left vertebral artery beyond PICA, but the vessel does show patency to the basilar. There is moderate stenosis of the distal right vertebral artery just proximal to the basilar. The basilar artery shows mild atherosclerotic irregularity but no focal stenosis. Superior cerebellar and posterior cerebral arteries  are patent. Both anterior and posterior circulation distal branch vessels show atherosclerotic irregularity. Venous sinuses: Patent and normal Anatomic variants: None significant Delayed phase: No abnormal enhancement. Review of the MIP images confirms the above findings IMPRESSION: In this patient with an acute right medulla infarction, there is calcified plaque at the right vertebral artery origin with 70% stenosis. There is noncalcified plaque at the left vertebral artery origin with 50% stenosis. There is atherosclerotic disease affecting the proximal intracranial vertebral arteries with severe stenosis on the left beyond PICA, 70% or greater. There is  stenosis of the distal right vertebral artery just proximal to the basilar estimated at 50%. Atherosclerotic change affecting both common carotid arteries and carotid bifurcations but without flow limiting stenosis. 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 Diameter as narrow as 2 mm on each side. 50% stenoses at both M1 origins. Distal intra cranial vessel atherosclerotic change diffusely. Electronically Signed   By: Nelson Chimes M.D.   On: 12/31/2016 08:20   Dg Chest 2 View  Result Date: 12/31/2016 CLINICAL DATA:  Fever EXAM: CHEST  2 VIEW COMPARISON:  None. FINDINGS: Heart is borderline in size. Lingular atelectasis or scarring. Right lung is clear. No effusions or acute bony abnormality. IMPRESSION: Lingular atelectasis or scarring. Electronically Signed   By: Rolm Baptise M.D.   On: 12/31/2016 13:48   Ct Head Wo Contrast  Result Date: 12/30/2016 CLINICAL DATA:  Left-sided numbness starting at 11 a.m. History of hypertension. EXAM: CT HEAD WITHOUT CONTRAST TECHNIQUE: Contiguous axial images were obtained from the base of the skull through the vertex without intravenous contrast. COMPARISON:  None. FINDINGS: Brain: No evidence of acute infarction, hemorrhage, hydrocephalus, extra-axial collection or mass lesion/mass effect. Vascular: Vascular calcifications are present. Skull: Normal. Negative for fracture or focal lesion. Sinuses/Orbits: No acute finding. Other: None. IMPRESSION: No acute intracranial abnormalities. Electronically Signed   By: Lucienne Capers M.D.   On: 12/30/2016 22:25   Ct Angio Neck W/cm &/or Wo/cm  Result Date: 12/31/2016 CLINICAL DATA:  Left facial numbness.  Left medulla infarction. EXAM: CT ANGIOGRAPHY HEAD AND NECK TECHNIQUE: Multidetector CT imaging of the head and neck was performed using the standard protocol during bolus administration of intravenous contrast. Multiplanar CT image reconstructions and MIPs were obtained to evaluate the vascular anatomy.  Carotid stenosis measurements (when applicable) are obtained utilizing NASCET criteria, using the distal internal carotid diameter as the denominator. CONTRAST:  50 cc Isovue 370 COMPARISON:  MRI same day FINDINGS: CTA NECK FINDINGS Aortic arch: No atherosclerotic change, aneurysm or dissection. Right carotid system: Common carotid artery shows some circumferential wall thickening with minimal luminal diameter of 5 mm compared to an expected diameter of 8 mm. Carotid bifurcation shows atherosclerotic disease with a small region of calcified plaque and circumferential wall thickening. Minimal diameter of the ICA is 4 mm. Compared to a more distal cervical ICA diameter of 4.5 mm, this indicates a 15-20% stenosis. Cervical ICA beyond that is widely patent. Left carotid system: Common carotid artery shows a normal caliber throughout. Mild atherosclerotic change at the carotid bifurcation with a punctate calcification and mild wall thickening. No narrowing of the ICA relative to the more distal cervical ICA. Vertebral arteries: Right vertebral artery origin shows calcified plaque with stenosis estimated at 70% or greater. Beyond that, the right vertebral artery is widely patent through the cervical region. Left vertebral artery origin shows 50% stenosis. Beyond that, the vessel is widely patent through the cervical region. Skeleton: Ordinary mild lower cervical  spondylosis. Other neck: No mass or lymphadenopathy. Upper chest: Normal Review of the MIP images confirms the above findings CTA HEAD FINDINGS Anterior circulation: Both internal carotid arteries are patent through the siphon regions. There is atherosclerosis throughout the siphon regions with extensive calcification. Stenosis at the proximal siphon on the right shows a diameter of 2 mm diameter in the mid siphon on the left is also 2 mm. Anterior and middle cerebral vessels are patent bilaterally with atherosclerotic irregularity. 50% stenoses at both M1  origins. Posterior circulation: Both vertebral arteries are patent at the foramen magnum. Both posterior inferior cerebellar arteries show flow. There is severe stenosis of the left vertebral artery beyond PICA, but the vessel does show patency to the basilar. There is moderate stenosis of the distal right vertebral artery just proximal to the basilar. The basilar artery shows mild atherosclerotic irregularity but no focal stenosis. Superior cerebellar and posterior cerebral arteries are patent. Both anterior and posterior circulation distal branch vessels show atherosclerotic irregularity. Venous sinuses: Patent and normal Anatomic variants: None significant Delayed phase: No abnormal enhancement. Review of the MIP images confirms the above findings IMPRESSION: In this patient with an acute right medulla infarction, there is calcified plaque at the right vertebral artery origin with 70% stenosis. There is noncalcified plaque at the left vertebral artery origin with 50% stenosis. There is atherosclerotic disease affecting the proximal intracranial vertebral arteries with severe stenosis on the left beyond PICA, 70% or greater. There is stenosis of the distal right vertebral artery just proximal to the basilar estimated at 50%. Atherosclerotic change affecting both common carotid arteries and carotid bifurcations but without flow limiting stenosis. 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 Diameter as narrow as 2 mm on each side. 50% stenoses at both M1 origins. Distal intra cranial vessel atherosclerotic change diffusely. Electronically Signed   By: Nelson Chimes M.D.   On: 12/31/2016 08:20   Mr Brain Wo Contrast  Result Date: 12/31/2016 CLINICAL DATA:  46 y/o M; left-sided facial numbness, headache, pressure behind the left eye, and weakness in the legs. EXAM: MRI HEAD WITHOUT CONTRAST TECHNIQUE: Multiplanar, multiecho pulse sequences of the brain and surrounding structures were obtained  without intravenous contrast. COMPARISON:  12/30/2016 CT head. FINDINGS: Brain: Subcentimeter focus of diffusion restriction within the left posterolateral medulla. Scattered foci of T2 FLAIR hyperintensity in subcortical and periventricular white matter are nonspecific. Mild diffuse brain parenchymal volume loss. No abnormal susceptibility hypointensity to indicate intracranial hemorrhage. No focal mass effect, hydrocephalus, or extra-axial collection. Vascular: Normal flow voids. Skull and upper cervical spine: Normal marrow signal. Sinuses/Orbits: Negative.  Underpneumatized frontal sinuses. Other: None. IMPRESSION: 1. Subcentimeter acute/early subacute infarction within the left posterolateral medulla. 2. Nonspecific T2 FLAIR hyperintense white matter foci unexpected for age probably represent microvascular ischemic changes particularly in the setting of diabetes, less likely migraine headache or sequelae of demyelination, vasculitis, and other infectious/inflammatory processes. These results were called by telephone at the time of interpretation on 12/31/2016 at 12:52 am to Dr. Thayer Jew , who verbally acknowledged these results. Electronically Signed   By: Kristine Garbe M.D.   On: 12/31/2016 00:53    echocardiogram  LV EF: 50% -   55%  ------------------------------------------------------------------- Indications:      CVA 436.  ------------------------------------------------------------------- History:   PMH:  AKI.  Risk factors:  Hypertension. Diabetes mellitus.  ------------------------------------------------------------------- Study Conclusions  - Left ventricle: The cavity size was normal. There was moderate   concentric hypertrophy. Systolic function was normal. The  estimated ejection fraction was in the range of 50% to 55%. Mild   diffuse hypokinesis. Doppler parameters are consistent with high   ventricular filling pressure. - Aortic valve: Transvalvular  velocity was within the normal range.   There was no stenosis. There was no regurgitation. - Mitral valve: Transvalvular velocity was within the normal range.   There was no evidence for stenosis. There was no regurgitation. - Left atrium: The atrium was mildly dilated. - Right ventricle: The cavity size was normal. Wall thickness was   normal. Systolic function was normal. - Tricuspid valve: There was no regurgitation.  Impressions:  - Endocardial border definition is poor. Suggest repeat echo with   Definity contrast for assessment of left ventricular function if   clinically indicated.      Filed Weights   12/30/16 2139 12/31/16 0300  Weight: (!) 145.2 kg (320 lb) (!) 147.9 kg (326 lb)     Microbiology: No results found for this or any previous visit (from the past 240 hour(s)).     Blood Culture No results found for: SDES, SPECREQUEST, CULT, REPTSTATUS    Labs: Results for orders placed or performed during the hospital encounter of 12/30/16 (from the past 48 hour(s))  Glucose, capillary     Status: Abnormal   Collection Time: 12/31/16 11:10 AM  Result Value Ref Range   Glucose-Capillary 356 (H) 65 - 99 mg/dL   Comment 1 Notify RN   Glucose, capillary     Status: Abnormal   Collection Time: 12/31/16  4:40 PM  Result Value Ref Range   Glucose-Capillary 252 (H) 65 - 99 mg/dL  Glucose, capillary     Status: Abnormal   Collection Time: 12/31/16  9:25 PM  Result Value Ref Range   Glucose-Capillary 266 (H) 65 - 99 mg/dL   Comment 1 Notify RN    Comment 2 Document in Chart   Hemoglobin A1c     Status: Abnormal   Collection Time: 01/01/17  2:20 AM  Result Value Ref Range   Hgb A1c MFr Bld 9.5 (H) 4.8 - 5.6 %    Comment: (NOTE)         Pre-diabetes: 5.7 - 6.4         Diabetes: >6.4         Glycemic control for adults with diabetes: <7.0    Mean Plasma Glucose 226 mg/dL    Comment: (NOTE) Performed At: [facility] [place], [place]  [id number] Lindon Romp MD WN:[id number]   Lipid panel     Status: Abnormal   Collection Time: 01/01/17  2:20 AM  Result Value Ref Range   Cholesterol 234 (H) 0 - 200 mg/dL   Triglycerides 166 (H) <150 mg/dL   HDL 33 (L) >40 mg/dL   Total CHOL/HDL Ratio 7.1 RATIO   VLDL 33 0 - 40 mg/dL   LDL Cholesterol 168 (H) 0 - 99 mg/dL    Comment:        Total Cholesterol/HDL:CHD Risk Coronary Heart Disease Risk Table                     Men   Women  1/2 Average Risk   3.4   3.3  Average Risk       5.0   4.4  2 X Average Risk   9.6   7.1  3 X Average Risk  23.4   11.0        Use the calculated Patient Ratio  above and the CHD Risk Table to determine the patient's CHD Risk.        ATP III CLASSIFICATION (LDL):  <100     mg/dL   Optimal  100-129  mg/dL   Near or Above                    Optimal  130-159  mg/dL   Borderline  160-189  mg/dL   High  >190     mg/dL   Very High   CBC     Status: Abnormal   Collection Time: 01/01/17  2:20 AM  Result Value Ref Range   WBC 12.8 (H) 4.0 - 10.5 K/uL   RBC 5.09 4.22 - 5.81 MIL/uL   Hemoglobin 14.9 13.0 - 17.0 g/dL   HCT 44.3 39.0 - 52.0 %   MCV 87.0 78.0 - 100.0 fL   MCH 29.3 26.0 - 34.0 pg   MCHC 33.6 30.0 - 36.0 g/dL   RDW 13.1 11.5 - 15.5 %   Platelets 261 150 - 400 K/uL  Comprehensive metabolic panel     Status: Abnormal   Collection Time: 01/01/17  2:20 AM  Result Value Ref Range   Sodium 131 (L) 135 - 145 mmol/L   Potassium 3.9 3.5 - 5.1 mmol/L   Chloride 92 (L) 101 - 111 mmol/L   CO2 30 22 - 32 mmol/L   Glucose, Bld 276 (H) 65 - 99 mg/dL   BUN 12 6 - 20 mg/dL   Creatinine, Ser 1.10 0.61 - 1.24 mg/dL   Calcium 8.4 (L) 8.9 - 10.3 mg/dL   Total Protein 7.2 6.5 - 8.1 g/dL   Albumin 3.1 (L) 3.5 - 5.0 g/dL   AST 15 15 - 41 U/L   ALT 15 (L) 17 - 63 U/L   Alkaline Phosphatase 54 38 - 126 U/L   Total Bilirubin 0.5 0.3 - 1.2 mg/dL   GFR calc non Af Amer >60 >60 mL/min   GFR calc Af Amer >60 >60 mL/min    Comment: (NOTE) The  eGFR has been calculated using the CKD EPI equation. This calculation has not been validated in all clinical situations. eGFR's persistently <60 mL/min signify possible Chronic Kidney Disease.    Anion gap 9 5 - 15  Glucose, capillary     Status: Abnormal   Collection Time: 01/01/17  6:49 AM  Result Value Ref Range   Glucose-Capillary 237 (H) 65 - 99 mg/dL   Comment 1 Notify RN    Comment 2 Document in Chart   Glucose, capillary     Status: Abnormal   Collection Time: 01/01/17 11:36 AM  Result Value Ref Range   Glucose-Capillary 278 (H) 65 - 99 mg/dL   Comment 1 Notify RN    Comment 2 Document in Chart   Glucose, capillary     Status: Abnormal   Collection Time: 01/01/17  5:14 PM  Result Value Ref Range   Glucose-Capillary 260 (H) 65 - 99 mg/dL   Comment 1 Notify RN    Comment 2 Document in Chart   Glucose, capillary     Status: Abnormal   Collection Time: 01/01/17  9:59 PM  Result Value Ref Range   Glucose-Capillary 309 (H) 65 - 99 mg/dL   Comment 1 Notify RN    Comment 2 Document in Chart   Glucose, capillary     Status: Abnormal   Collection Time: 01/02/17  6:03 AM  Result Value Ref Range   Glucose-Capillary 248 (  H) 65 - 99 mg/dL   Comment 1 Notify RN    Comment 2 Document in Chart      Lipid Panel     Component Value Date/Time   CHOL 234 (H) 01/01/2017 0220   TRIG 166 (H) 01/01/2017 0220   HDL 33 (L) 01/01/2017 0220   CHOLHDL 7.1 01/01/2017 0220   VLDL 33 01/01/2017 0220   LDLCALC 168 (H) 01/01/2017 0220     Lab Results  Component Value Date   HGBA1C 9.5 (H) 01/01/2017     Lab Results  Component Value Date   LDLCALC 168 (H) 01/01/2017   CREATININE 1.10 01/01/2017     HPI :  46 y.o.malewith medical history significant of hypertension, diabetes mellitus, formal smoker (quit 6 weeks ago), who presents with left facial numbness, headache and gait instability.presented to the [facility] ED with c/c of left sided facial numbness beginning about 11 AM on 2/5  (LKW). He also had a headache involving his left periorbital and retro-orbital region. He was seen for this, receiving Toradol and an unknown BP medication. Soon thereafter he began experiencing numbness of the left side of his face. He went home, slept, and on awakening he experienced severe gait instability and left sided incoordination. Also felt as though he was "drifting to the left" while ambulating. He states that he has slight difficulty swallowing with a sensation that swallowed material does not move as well in the left side of his throat relative to the right when he swallows. He recently finished a course of antibiotic for pneumonia. He has a PMHx of DM. He denies prior stroke or MI. Patient was not administered IV t-PA secondary to delay in arrival. 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 Reading inverted per Dr. Leonie Man - stenosis worse on the L per his reading), proximal L VA 70% stenosis just beyond PICA, distal R VA proximal to BA 50%. Severe carotid siphon atherosclerosis. 50% stenosis B M1  2D Echo   as above   LDL  168  HgbA1c  9.5  Diet regular  aspirin 81 mg daily prior to admission, as per neurology patient should be treated with aspirin 81 mg and clopidogrel 75 mg orally every day x 3 months for secondary stroke prevention. After 3 months, change to plavix alone. Long-term dual antiplatelets are contraindicated due to risk for intracerebral hemorrhage.   Therapy recommendations:  Outpatient PT and OT vs CIR   Follow-up with neurology in the outpatient setting in 4-6 weeks     HTN: Continue outpatient regimen, aggressive risk factor modification recommended Could consider  adding an ACE inhibitor in the next 1-2 weeks and follow renal function closely   DM-II:Last A1 not on record. Patient is takingmetformin.at home. Blood sugar 415 upon this admission. Started on Lantus 30 units qhs, still uncontrolled increased to 35 units at bedtime,   diabetes coordinator consulted to teach insulin administration, Accu-Chek monitoring    AKI:cre 1.34 Likely due to prerenal secondary to dehydration and continuation of diruetics. Resolved, creatinine 1.1 prior to discharge    Hx of Tobacco abuse: pt quit smoking 6 weeks ago -Did counseling about importance of not to restart smoking -refused Nicotine patch    Blood pressure [name])  170/80, pulse 88, temperature 98.8 F (37.1 C), temperature source Oral, resp. rate 19, height 6' 2"  (1.88 m), weight (!) 147.9 kg (326 lb), SpO2 95 %.  Cardiac: S1/S2, RRR, No murmurs, No gallops or rubs. Respiratory: No rales, wheezing, rhonchi or rubs. GI: Soft, nondistended, nontender, no rebound pain, no organomegaly, BS present. GU: No hematuria Ext: No pitting leg edema bilaterally. 2+DP/PT pulse bilaterally. Musculoskeletal: No joint deformities, No joint redness or warmth, no limitation of ROM in spin. Skin: No rashes.  Neuro: Alert, oriented X3, cranial nerves II-XII grossly intact, moves all extremities normally. Muscle strength 5/5 in all extremities, sensation to light touch intact. Brachial reflex 2+ bilaterally. Knee reflex 1+ bilaterally. Negative Babinski's sign. Normal finger to nose test. Psych: Patient is not psychotic, no suicidal or hemocidal ideation.    Follow-up Information    BOUSKA,DAVID E, MD. Call.   Specialty:  Family Medicine Why:  To make follow-up appointment in 3-5 days, acute CVA Contact information: [address] [place] [address] [phone]        Antony Contras, MD. Call in 6 week(s).   Specialties:  Neurology, Radiology Why:  stroke clinic. office  will all with  follow-up appointment in 4-6 weeks Contact information: [address] [place] [address] [phone]           Signed: Reyne Dumas 01/02/2017, 10:06 AM        Time spent >45 mins

## 2017-01-02 NOTE — H&P (Signed)
Physical Medicine and Rehabilitation Admission H&P       Chief Complaint  Patient presents with  . Dizziness and ataxia    HPI: Nicholas Mejia a 46 y.o.malewith history of HTN, T2DM, tobacco use (quit 6 weeks ago) who was admitted to [facility] on 12/31/16 with left facial numbness, HA and unsteady gait. 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 2D echo with contrast revealed EF 55-60% and no wall abnormality or thrombus.  Dr. Leonie Man recommended ASA and plavix for thrombotic stroke due to L-VA stenosis/large vessel disease. He reported worsening of symptoms with diplopia and nausea with activity this am. CT head stable and he was treated with fluid bolus.  Therapy ongoing and patient with ataxia affecting balance and gait with balance deficits and poor safety. CIR was  recommended for follow up therapy.    Review of Systems  Constitutional: Positive for malaise/fatigue (for past 6 months).  HENT: Negative for hearing loss and tinnitus.   Eyes: Positive for double vision. Negative for blurred vision.  Respiratory: Negative for cough and shortness of breath.   Cardiovascular: Negative for chest pain and palpitations.  Gastrointestinal: Positive for nausea. Negative for constipation, heartburn and vomiting.  Genitourinary: Negative for dysuria and urgency.  Musculoskeletal: Negative for back pain, joint pain and myalgias.  Skin: Negative for itching and rash.  Neurological: Positive for sensory change (RUE/RLE with loss of cold sensation and Left facial numbness). Negative for focal weakness.  Psychiatric/Behavioral: Negative for memory loss. The patient does not have insomnia.          Past Medical History:  Diagnosis Date  . Diabetes mellitus without complication [facility])          Past  Surgical History:  Procedure Laterality Date  . EXTERNAL EAR SURGERY           Family History  Problem Relation Age of Onset  . Heart attack Father   . Gout Brother     Social History:  Lives alone--. Work as Financial trader for [employer] reports that he has quit smoking 6 weeks ago. He does not have any smokeless tobacco history on file. He reports that he drinks a glass of wine daily. He reports that he does not use drugs.        Allergies  Allergen Reactions  . Bee Venom Anaphylaxis and Swelling          Medications Prior to Admission  Medication Sig Dispense Refill  . aspirin EC 81 MG tablet Take 162 mg by mouth daily.    . chlorthalidone (HYGROTON) 25 MG tablet Take 25 mg by mouth daily.    . fluticasone (FLONASE) 50 MCG/ACT nasal spray Place 1 spray into both nostrils daily.    . fluticasone-salmeterol (ADVAIR HFA) 115-21 MCG/ACT inhaler Inhale 2 puffs into the lungs 2 (two) times daily as needed (shortness of breath).    [name] guaiFENesin-codeine 100-10 MG/5ML syrup Take 5 mLs by mouth 3 (three) times daily as needed for cough.    . metFORMIN (GLUCOPHAGE-XR) 500 MG 24 hr tablet Take 500 mg by mouth See admin instructions. Take 1 tablet every morning, takes another tablet in the evening if remembers    . montelukast (SINGULAIR) 10 MG tablet Take 10 mg by mouth at bedtime.    . Turmeric 500 MG TABS Take  1,000 mg by mouth 2 (two) times daily.      Home: Home Living Family/patient expects to be discharged to:: Private residence Living Arrangements: Alone Available Help at Discharge: Friend(s), Available PRN/intermittently Type of Home: Apartment Home Access: Stairs to enter Technical brewer of Steps: flight Entrance Stairs-Rails: Right Home Layout: One level Bathroom Shower/Tub: Chiropodist: Standard Home Equipment: None Additional Comments: states mom can come from [place] to stay temporarily  Lives  With: Alone   Functional History: Prior Function Level of Independence: Independent Comments: worked for [employer] on [place] (states walked 3 miles a day)  Functional Status:  Mobility: Bed Mobility Overal bed mobility: Independent General bed mobility comments: pt able to get up with no cuing or assistance. Transfers Overall transfer level: Needs assistance Equipment used: None Transfers: Sit to/from Stand, Stand Pivot Transfers Sit to Stand: Min guard Stand pivot transfers: Min assist General transfer comment: min A without UE support  Ambulation/Gait Ambulation/Gait assistance: Min assist, Mod assist Ambulation Distance (Feet): 250 Feet Assistive device: Rolling walker (2 wheeled), Quad cane Gait Pattern/deviations: Step-through pattern, Decreased step length - right, Decreased step length - left, Narrow base of support General Gait Details: initially began with straight cane but tansitioned back to RW for patient safety. Pt experienced three episodes of LOB with min-mod A to correct. Encouraged pt to progress slowly after getting up to avoid LOB. Pt required verbal cuing to widen BOS and to correct left lean. Pt aware of defecit but unable to correct.; expressed frustration of deficits at end of tx.  Stairs: Yes Stairs assistance: Min guard Stair Management: One rail Right, Alternating pattern, Step to pattern, Sideways, Forwards Number of Stairs: 10 General stair comments: unable to perform due to limited session time  ADL: ADL Overall ADL's : Needs assistance/impaired Eating/Feeding: Independent Grooming: Wash/dry hands, Wash/dry face, Brushing hair, Minimal assistance, Standing Grooming Details (indicate cue type and reason): pt static standing able to complete task but requires UE support Upper Body Bathing: Min guard Lower Body Bathing: Minimal assistance, Sit to/from stand Lower Body Bathing Details (indicate cue type and reason): without UE support, mod A  to access feet in standing without UE support which  Upper Body Dressing : Set up, Sitting Lower Body Dressing: Minimal assistance, Sit to/from stand Lower Body Dressing Details (indicate cue type and reason): without UE support  Toilet Transfer: Moderate assistance, Ambulation, Comfort height toilet, Grab bars, RW Toilet Transfer Details (indicate cue type and reason): ambulating without AD  Toileting- Clothing Manipulation and Hygiene: Minimal assistance, Sit to/from stand Toileting - Clothing Manipulation Details (indicate cue type and reason): without UE support  Tub/ Banker: Minimal assistance, Engineer, agricultural Details (indicate cue type and reason): Pt instructed in options for shower seats  Functional mobility during ADLs: Moderate assistance General ADL Comments: Pt with LOB during ADLs without UE support   Cognition: Cognition Overall Cognitive Status: Within Functional Limits for tasks assessed Arousal/Alertness: Awake/alert Orientation Level: Oriented X4 Attention: Sustained Sustained Attention: Appears intact Memory: Impaired Memory Impairment: Retrieval deficit, Decreased short term memory Decreased Short Term Memory: Verbal basic Awareness: Appears intact Problem Solving: Appears intact Safety/Judgment: Appears intact Cognition Arousal/Alertness: Awake/alert Behavior During Therapy: WFL for tasks assessed/performed Overall Cognitive Status: Within Functional Limits for tasks assessed Area of Impairment: Safety/judgement Safety/Judgement: Decreased awareness of deficits General Comments: Pt requests to wear tennis shoes to help prevent falling. Progressed awareness of deficits throughout tx.    Blood pressure (!) 170/80, pulse 88, temperature 98.8  F (37.1 C), temperature source Oral, resp. rate 19, height 6\' 2"  (1.88 m), weight (!) 147.9 kg (326 lb), SpO2 95 %. Physical Exam  Nursing note and vitals reviewed. Constitutional: He is  oriented to person, place, and time. He appears well-developed and well-nourished.  HENT:  Head: Normocephalic and atraumatic.  Mouth/Throat: Oropharynx is clear and moist.  Eyes: Conjunctivae and EOM are normal. Pupils are equal, round, and reactive to light.  Neck: Normal range of motion. Neck supple.  Cardiovascular: Normal rate and regular rhythm.   Respiratory: Effort normal and breath sounds normal. No stridor.  GI: Soft. Bowel sounds are normal. He exhibits no distension.  Musculoskeletal: Normal range of motion.  Neurological: He is alert and oriented to person, place, and time. A cranial nerve deficit is present.  left hemiataxia, right hemisensory deficits, left facial dysesthesias  Skin: Skin is warm and dry.  Psychiatric: He has a normal mood and affect. His behavior is normal. Thought content normal.    Lab Results Last 48 Hours        Results for orders placed or performed during the hospital encounter of 12/30/16 (from the past 48 hour(s))  Glucose, capillary     Status: Abnormal   Collection Time: 12/31/16 11:10 AM  Result Value Ref Range   Glucose-Capillary 356 (H) 65 - 99 mg/dL   Comment 1 Notify RN   Glucose, capillary     Status: Abnormal   Collection Time: 12/31/16  4:40 PM  Result Value Ref Range   Glucose-Capillary 252 (H) 65 - 99 mg/dL  Glucose, capillary     Status: Abnormal   Collection Time: 12/31/16  9:25 PM  Result Value Ref Range   Glucose-Capillary 266 (H) 65 - 99 mg/dL   Comment 1 Notify RN    Comment 2 Document in Chart   Hemoglobin A1c     Status: Abnormal   Collection Time: 01/01/17  2:20 AM  Result Value Ref Range   Hgb A1c MFr Bld 9.5 (H) 4.8 - 5.6 %    Comment: (NOTE)         Pre-diabetes: 5.7 - 6.4         Diabetes: >6.4         Glycemic control for adults with diabetes: <7.0    Mean Plasma Glucose 226 mg/dL    Comment: (NOTE) Performed At: [facility] [address] [place], [place] [id number] MD Mila Homer   Lipid panel     Status: Abnormal   Collection Time: 01/01/17  2:20 AM  Result Value Ref Range   Cholesterol 234 (H) 0 - 200 mg/dL   Triglycerides 03/01/17 (H) <150 mg/dL   HDL 33 (L) 081 mg/dL   Total CHOL/HDL Ratio 7.1 RATIO   VLDL 33 0 - 40 mg/dL   LDL Cholesterol >38 (H) 0 - 99 mg/dL    Comment:        Total Cholesterol/HDL:CHD Risk Coronary Heart Disease Risk Table                     Men   Women  1/2 Average Risk   3.4   3.3  Average Risk       5.0   4.4  2 X Average Risk   9.6   7.1  3 X Average Risk  23.4   11.0        Use the calculated Patient Ratio above and the CHD Risk Table to determine the patient's  CHD Risk.        ATP III CLASSIFICATION (LDL):  <100     mg/dL   Optimal  100-129  mg/dL   Near or Above                    Optimal  130-159  mg/dL   Borderline  160-189  mg/dL   High  >190     mg/dL   Very High   CBC     Status: Abnormal   Collection Time: 01/01/17  2:20 AM  Result Value Ref Range   WBC 12.8 (H) 4.0 - 10.5 K/uL   RBC 5.09 4.22 - 5.81 MIL/uL   Hemoglobin 14.9 13.0 - 17.0 g/dL   HCT 44.3 39.0 - 52.0 %   MCV 87.0 78.0 - 100.0 fL   MCH 29.3 26.0 - 34.0 pg   MCHC 33.6 30.0 - 36.0 g/dL   RDW 13.1 11.5 - 15.5 %   Platelets 261 150 - 400 K/uL  Comprehensive metabolic panel     Status: Abnormal   Collection Time: 01/01/17  2:20 AM  Result Value Ref Range   Sodium 131 (L) 135 - 145 mmol/L   Potassium 3.9 3.5 - 5.1 mmol/L   Chloride 92 (L) 101 - 111 mmol/L   CO2 30 22 - 32 mmol/L   Glucose, Bld 276 (H) 65 - 99 mg/dL   BUN 12 6 - 20 mg/dL   Creatinine, Ser 1.10 0.61 - 1.24 mg/dL   Calcium 8.4 (L) 8.9 - 10.3 mg/dL   Total Protein 7.2 6.5 - 8.1 g/dL   Albumin 3.1 (L) 3.5 - 5.0 g/dL   AST 15 15 - 41 U/L   ALT 15 (L) 17 - 63 U/L   Alkaline Phosphatase 54 38 - 126 U/L   Total Bilirubin 0.5 0.3 - 1.2 mg/dL   GFR calc non Af Amer >60 >60 mL/min   GFR calc Af Amer >60 >60 mL/min     Comment: (NOTE) The eGFR has been calculated using the CKD EPI equation. This calculation has not been validated in all clinical situations. eGFR's persistently <60 mL/min signify possible Chronic Kidney Disease.    Anion gap 9 5 - 15  Glucose, capillary     Status: Abnormal   Collection Time: 01/01/17  6:49 AM  Result Value Ref Range   Glucose-Capillary 237 (H) 65 - 99 mg/dL   Comment 1 Notify RN    Comment 2 Document in Chart   Glucose, capillary     Status: Abnormal   Collection Time: 01/01/17 11:36 AM  Result Value Ref Range   Glucose-Capillary 278 (H) 65 - 99 mg/dL   Comment 1 Notify RN    Comment 2 Document in Chart   Glucose, capillary     Status: Abnormal   Collection Time: 01/01/17  5:14 PM  Result Value Ref Range   Glucose-Capillary 260 (H) 65 - 99 mg/dL   Comment 1 Notify RN    Comment 2 Document in Chart   Glucose, capillary     Status: Abnormal   Collection Time: 01/01/17  9:59 PM  Result Value Ref Range   Glucose-Capillary 309 (H) 65 - 99 mg/dL   Comment 1 Notify RN    Comment 2 Document in Chart   Glucose, capillary     Status: Abnormal   Collection Time: 01/02/17  6:03 AM  Result Value Ref Range   Glucose-Capillary 248 (H) 65 - 99 mg/dL   Comment 1 Notify  RN    Comment 2 Document in Chart       Imaging Results (Last 48 hours)  Dg Chest 2 View  Result Date: 12/31/2016 CLINICAL DATA:  Fever EXAM: CHEST  2 VIEW COMPARISON:  None. FINDINGS: Heart is borderline in size. Lingular atelectasis or scarring. Right lung is clear. No effusions or acute bony abnormality. IMPRESSION: Lingular atelectasis or scarring. Electronically Signed   By: Charlett Nose M.D.   On: 12/31/2016 13:48        Medical Problem List and Plan: 1.  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  DVT Prophylaxis/Anticoagulation: Pharmaceutical: Lovenox 3. Pain Management: N/A 4. Mood: LCSW to follow  for evaluation and support.  5. Neuropsych: This patient is capable of making decisions on his own behalf. 6. Skin/Wound Care: Routine pressure relief measures.  7. Fluids/Electrolytes/Nutrition: Monitor I/O. Check lytes in am.  8. HTN: Monitor BP bid--with permissive HTN for 5-7 days. Will add medications as indicated if remains persistently high.  9. T2DM: Hgb A1C- 9.5. He reports that he has not been fully compliant with meds due to malaise X months. Monitor BS ac/hs. Continue lantus insulin for now--taper as BS improve.  Will resume Metformin BID and titrate upwards. . Use SSI for elevated BS.   10. Dyslipidemia: Now on Lipitor 11. Hyponatremia: Monitor intake. Recheck labs in am.  12. Leucocytosis: Reports recent bout of PNA. No respiratory symptoms reported. Monitor for now.  13 CKD?: SCr-1.34 at admission.  14. Nausea/Dizziness: Will order vestibular evaluation. Continue prn zofran for nausea.  15. Morbid obesity: Educated appropriate diet and on importance of weight loss to help promote overall health and function.     Post Admission Physician Evaluation: 1. 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 2. Patient is admitted to receive collaborative, interdisciplinary care between the physiatrist, rehab nursing staff, and therapy team. 3. Patient's level of medical complexity and substantial therapy needs in context of that medical necessity cannot be provided at a lesser intensity of care such as a SNF. 4. Patient has experienced substantial functional loss from his/her baseline which was documented above under the "Functional History" and "Functional Status" headings.  Judging by the patient's diagnosis, physical exam, and functional history, the patient has potential for functional progress which will result in measurable gains while on inpatient rehab.  These gains will be of substantial and practical use upon discharge  in facilitating mobility and self-care at the  household level. 5. Physiatrist will provide 24 hour management of medical needs as well as oversight of the therapy plan/treatment and provide guidance as appropriate regarding the interaction of the two. 6. The Preadmission Screening has been reviewed and patient status is unchanged unless otherwise stated above. 7. 24 hour rehab nursing will assist with bladder management, bowel management, safety, skin/wound care, disease management, medication administration, pain management and patient education  and help integrate therapy concepts, techniques,education, etc. 8. PT will assess and treat for/with: Lower extremity strength, range of motion, stamina, balance, functional mobility, safety, adaptive techniques and equipment, NMR, community reintegration.   Goals are: mod I. 9. OT will assess and treat for/with: ADL's, functional mobility, safety, upper extremity strength, adaptive techniques and equipment, NMR, community reintegration.   Goals are: mod I. Therapy may proceed with showering this patient. 10. SLP will assess and treat for/with: n/a.  Goals are: n/a. 11. Case Management and Social Worker will assess and  treat for psychological issues and discharge planning. 12. Team conference will be held weekly to assess progress toward goals and to determine barriers to discharge. 13. Patient will receive at least 3 hours of therapy per day at least 5 days per week. 14. ELOS: 7-10 days       15. Prognosis:  excellent     Meredith Staggers, MD, [facility] 01/02/2017  Bary Leriche, Hershal Coria 01/02/2017

## 2017-01-02 NOTE — Progress Notes (Signed)
Rehab admissions - I met with patient this am and he would like inpatient rehab admissions.  I do have approval for acute inpatient rehab admission.  However, rehab beds are full at this time.  I do have a rehab bed available tomorrow.  If something changes and if I get another bed open today, I will notify all.  If he remains in the hospital, I can admit him to inpatient rehab tomorrow, Saturday.  Call me for questions.  #[phone]

## 2017-01-02 NOTE — Progress Notes (Signed)
Inpatient Diabetes Program Recommendations  AACE/ADA: New Consensus Statement on Inpatient Glycemic Control (2015)  Target Ranges:  Prepandial:   less than 140 mg/dL      Peak postprandial:   less than 180 mg/dL (1-2 hours)      Critically ill patients:  140 - 180 mg/dL   Results for Benetta SparCOOK, Aaro (MRN [id number]) as of 01/02/2017 14:32  Ref. Range 01/01/2017 06:49 01/01/2017 11:36 01/01/2017 17:14 01/01/2017 21:59 01/02/2017 06:03 01/02/2017 11:18  Glucose-Capillary Latest Ref Range: 65 - 99 mg/dL 409237 (H) 811278 (H) 914260 (H) 309 (H) 248 (H) 289 (H)   Review of Glycemic Control  Inpatient Diabetes Program Recommendations:   Basal insulin not given yesterday 2/8, fasting glucose elevated 248 mg/d. Patient received 35 units of Lantus. No orders for further basal insulin for patient. Please reorder Lantus 35 units Daily for inpatient rehab.  Thanks,  Christena DeemShannon Kazia Grisanti RN, MSN, [facility] Inpatient Diabetes Coordinator Team Pager [phone] (8a-5p)

## 2017-01-03 ENCOUNTER — Inpatient Hospital Stay (HOSPITAL_COMMUNITY): Payer: 59 | Admitting: Occupational Therapy

## 2017-01-03 ENCOUNTER — Inpatient Hospital Stay (HOSPITAL_COMMUNITY): Payer: 59 | Admitting: Physical Therapy

## 2017-01-03 DIAGNOSIS — Z794 Long term (current) use of insulin: Secondary | ICD-10-CM

## 2017-01-03 DIAGNOSIS — E782 Mixed hyperlipidemia: Secondary | ICD-10-CM

## 2017-01-03 DIAGNOSIS — I10 Essential (primary) hypertension: Secondary | ICD-10-CM

## 2017-01-03 DIAGNOSIS — D72829 Elevated white blood cell count, unspecified: Secondary | ICD-10-CM

## 2017-01-03 DIAGNOSIS — IMO0002 Reserved for concepts with insufficient information to code with codable children: Secondary | ICD-10-CM

## 2017-01-03 DIAGNOSIS — E871 Hypo-osmolality and hyponatremia: Secondary | ICD-10-CM

## 2017-01-03 DIAGNOSIS — N183 Chronic kidney disease, stage 3 unspecified: Secondary | ICD-10-CM | POA: Insufficient documentation

## 2017-01-03 DIAGNOSIS — I63112 Cerebral infarction due to embolism of left vertebral artery: Secondary | ICD-10-CM

## 2017-01-03 DIAGNOSIS — E1159 Type 2 diabetes mellitus with other circulatory complications: Secondary | ICD-10-CM

## 2017-01-03 DIAGNOSIS — E1165 Type 2 diabetes mellitus with hyperglycemia: Secondary | ICD-10-CM

## 2017-01-03 LAB — COMPREHENSIVE METABOLIC PANEL
ALK PHOS: 56 U/L (ref 38–126)
ALT: 17 U/L (ref 17–63)
ANION GAP: 10 (ref 5–15)
AST: 20 U/L (ref 15–41)
Albumin: 3.1 g/dL — ABNORMAL LOW (ref 3.5–5.0)
BILIRUBIN TOTAL: 0.9 mg/dL (ref 0.3–1.2)
BUN: 11 mg/dL (ref 6–20)
CO2: 32 mmol/L (ref 22–32)
Calcium: 8.3 mg/dL — ABNORMAL LOW (ref 8.9–10.3)
Chloride: 92 mmol/L — ABNORMAL LOW (ref 101–111)
Creatinine, Ser: 1.25 mg/dL — ABNORMAL HIGH (ref 0.61–1.24)
GFR calc non Af Amer: >60 mL/min (ref >60)
Glucose, Bld: 173 mg/dL — ABNORMAL HIGH (ref 65–99)
Potassium: 4 mmol/L (ref 3.5–5.1)
Sodium: 134 mmol/L — ABNORMAL LOW (ref 135–145)
Total Protein: 7.3 g/dL (ref 6.5–8.1)

## 2017-01-03 LAB — CBC WITH DIFFERENTIAL/PLATELET
Basophils Absolute: 0 10*3/uL (ref 0.0–0.1)
Basophils Relative: 0 %
Eosinophils Absolute: 0.1 10*3/uL (ref 0.0–0.7)
Eosinophils Relative: 1 %
HEMATOCRIT: 45.6 % (ref 39.0–52.0)
HEMOGLOBIN: 15.1 g/dL (ref 13.0–17.0)
Lymphocytes Relative: 22 %
Lymphs Abs: 3 10*3/uL (ref 0.7–4.0)
MCH: 29.3 pg (ref 26.0–34.0)
MCHC: 33.1 g/dL (ref 30.0–36.0)
MCV: 88.4 fL (ref 78.0–100.0)
MONOS PCT: 8 %
Monocytes Absolute: 1.1 10*3/uL — ABNORMAL HIGH (ref 0.1–1.0)
NEUTROS ABS: 9.2 10*3/uL — AB (ref 1.7–7.7)
NEUTROS PCT: 69 %
Platelets: 249 10*3/uL (ref 150–400)
RBC: 5.16 MIL/uL (ref 4.22–5.81)
RDW: 13.2 % (ref 11.5–15.5)
WBC: 13.6 10*3/uL — ABNORMAL HIGH (ref 4.0–10.5)

## 2017-01-03 LAB — GLUCOSE, CAPILLARY
GLUCOSE-CAPILLARY: 135 mg/dL — AB (ref 65–99)
Glucose-Capillary: 153 mg/dL — ABNORMAL HIGH (ref 65–99)
Glucose-Capillary: 267 mg/dL — ABNORMAL HIGH (ref 65–99)
Glucose-Capillary: 235 mg/dL — ABNORMAL HIGH (ref 65–99)

## 2017-01-03 NOTE — Evaluation (Signed)
Occupational Therapy Assessment and Plan  Patient Details  Name: Nicholas Mejia MRN: [id number] Date of Birth: 01-28-1971  OT Diagnosis: ataxia and disturbance of vision Rehab Potential: Rehab Potential (ACUTE ONLY): Excellent ELOS: 12-14 days   Today's Date: 01/03/2017 OT Individual Time: 1001-1101 and 1419-1450 OT Individual Time Calculation (min): 60 min   And 31 min  Problem List:  Patient Active Problem List   Diagnosis Date Noted  . Stage 3 chronic kidney disease   . Hyponatremia   . Type II diabetes mellitus, uncontrolled ([place])   . Benign essential HTN   . Mixed hyperlipidemia   . 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 Stroke (cerebrum) ([place]) 12/31/2016  . Diabetes mellitus without complication ([place]) 40/34/7425  . AKI (acute kidney injury) ([place]) 12/31/2016  . Leukocytosis 12/31/2016  . Essential hypertension 12/31/2016    Past Medical History:  Past Medical History:  Diagnosis Date  . Diabetes mellitus without complication [facility])    Past Surgical History:  Past Surgical History:  Procedure Laterality Date  . EXTERNAL EAR SURGERY      Assessment & Plan Clinical Impression: Nicholas Mejia a 46 y.o.malewith history of HTN, T2DM, tobacco use (quit 6 weeks ago) who was admitted to [facility] on 12/31/16 with left facial numbness, HA and unsteady gait. 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 2D echo with contrast revealed EF 55-60% and no wall abnormality or thrombus. Dr. Leonie Man recommended ASA and plavix for thrombotic stroke due to L-VA stenosis/large vessel disease. He reported worsening of symptoms with diplopia and nausea with activity this am. CT head stable and he was treated with fluid bolus. Therapy ongoing and patient with ataxia affecting balance and gait  with balance deficits and poor safety. CIR was recommended for follow up therapy.   Patient currently requires mod with basic self-care skills secondary to ataxia, decreased cardiorespiratory endurance, impaired timing and sequencing, unbalanced muscle activation, decreased coordination, diploplia, and decreased postural control .  Prior to hospitalization, patient could complete BADLs with independent .  Patient will benefit from skilled intervention to increase independence with basic self-care skills prior to discharge home with care partner.  Anticipate patient will require intermittent supervision and no further OT follow recommended.  OT - End of Session Endurance Deficit: Yes Endurance Deficit Description: nausea, fatigue OT Assessment Rehab Potential (ACUTE ONLY): Excellent OT Patient demonstrates impairments in the following area(s): Balance;Sensory;Vision;Endurance;Motor OT Basic ADL's Functional Problem(s): Bathing;Grooming;Dressing;Toileting OT Advanced ADL's Functional Problem(s): Simple Meal Preparation;Laundry;Light Housekeeping OT Transfers Functional Problem(s): Toilet;Tub/Shower OT Plan OT Intensity: Minimum of 1-2 x/day, 45 to 90 minutes OT Frequency: 5 out of 7 days OT Duration/Estimated Length of Stay: 12-14 days OT Treatment/Interventions: Balance/vestibular training;Discharge planning;Self Care/advanced ADL retraining;Therapeutic Activities;UE/LE Coordination activities;Functional mobility training;Patient/family education;Therapeutic Exercise;Visual/perceptual remediation/compensation;DME/adaptive equipment instruction;Neuromuscular re-education;Psychosocial support;UE/LE Strength taining/ROM OT Self Feeding Anticipated Outcome(s): N/A OT Basic Self-Care Anticipated Outcome(s): Supervision-Mod I  OT Toileting Anticipated Outcome(s): Mod I  OT Bathroom Transfers Anticipated Outcome(s): Mod I  OT Recommendation Patient destination: Home Follow Up Recommendations:  None Equipment Recommended: To be determined   Skilled Therapeutic Intervention Skilled OT session completed with focus on initial evaluation, education on OT role/POC, and establishment of patient-centered goals. Pt was sitting in recliner at time of arrival agreeable to tx. He ambulated with Mod A HHA (on left side) to  tub bench with 1 small LOB and therapist correction. Per pt, LOBs typically occur on left. He completed bathing with Mod A for reaching feet. No LOBs in shower with unilateral support on grab bars during pericare. Noted sensory deficits in shower including hot/cold with R UE. Pt also reports that he can't "feel water." Dressing was then completed w/c level at sink. Min A required for left footwear. Grooming/oral care tasks completed with pt able to use bilateral UEs functionally without observable deficits. Discussed ELOS, goals, and DME needs. He was left at EOB at time of departure with education completed regarding use of call bell. He verbalized understanding. Pt was left with all needs within reach a time of departure.   2nd Session 1:1 tx (31 min) Pt was sitting in w/c at time of arrival, agreeable to session. Pt provided with eye patch due to c/o having double vision with exercise. Pt did not require eye patch during tx today secondary to being asymptomatic. Tx focus on IADL retraining and dynamic standing balance. Pt ambulated to therapy apartment with Mod A HHA. Pt exhibited narrow gait with supinated left foot. Cues provided to correct as well as to slow down and concentrate on stepping pattern. Pt ambulated in therapy kitchen to complete simple meal prep/dishwashing tasks, requiring overall Mod A with cues for furniture walking. 2 LOBs while changing directions during item retrieval without UE support. He was able to retrieve items from high cabinets and low shelves in refrigerator with unilateral support. Discussed meal prep safety due to sensory deficits with verbalized  understanding. Afterwards pt ambulated back to room in manner as written above (120 ft in total). He was left at EOB with all needs within reach at time of departure.   OT Evaluation Precautions/Restrictions  Precautions Precautions: Fall Precaution Comments: monitor vitals, permissive HTN, DM, left LOBs Restrictions Weight Bearing Restrictions: No General Chart Reviewed: Yes Family/Caregiver Present: No Vital Signs  Pain No c/o pain during session, no c/o nausea during session    Home Living/Prior Functioning Home Living Available Help at Discharge: Friend(s), Available PRN/intermittently, Family Type of Home: Apartment Home Access: Stairs to enter Technical brewer of Steps: 12-14 Entrance Stairs-Rails: Right Home Layout: One level Bathroom Shower/Tub: Chiropodist: Standard Additional Comments: states mom can come from [place] to stay temporarily  Lives With: Alone IADL History Homemaking Responsibilities: Yes Meal Prep Responsibility: Primary Laundry Responsibility: Primary Cleaning Responsibility: Primary Bill Paying/Finance Responsibility: Primary Shopping Responsibility: Primary Occupation: Full time employment Type of Occupation: Worked for [employer] and Hobbies: Walking 3 miles a day Prior Function Level of Independence: Independent with basic ADLs, Independent with homemaking with ambulation, Independent with transfers  Able to Take Stairs?: Yes Driving: Yes Vocation: Full time employment Comments: worked for [employer] on [place] (states walked 3 miles a day) ADL ADL ADL Comments: Please see functional navigator for ADL status Vision/Perception  Vision- History Baseline Vision/History: Wears glasses Wears Glasses: At all times Patient Visual Report: Diplopia;Eye fatigue/eye pain/headache Vision- Assessment Vision Assessment?: Yes Convergence: Impaired - to be further tested in functional  context Diplopia Assessment: Other (comment) (Manifests during rigorous exercise, per pt report)  Cognition Overall Cognitive Status: Within Functional Limits for tasks assessed Arousal/Alertness: Awake/alert Orientation Level: Person;Place;Situation Person: Oriented Place: Oriented Situation: Oriented Year: 2018 Month: February Day of Week: Correct Immediate Memory Recall: Sock;Blue;Bed Memory Recall: Sock;Blue;Bed Memory Recall Sock: Without Cue Memory Recall Blue: Without Cue Memory Recall Bed: Without Cue Attention: Sustained Focused Attention: Appears intact  Awareness: Appears intact Problem Solving: Appears intact Safety/Judgment: Appears intact Sensation Sensation Light Touch: Impaired Detail Light Touch Impaired Details: Impaired RUE;Impaired RLE Stereognosis: Appears Intact Hot/Cold: Impaired Detail Hot/Cold Impaired Details: Absent RUE;Absent RLE Proprioception: Appears Intact Coordination Gross Motor Movements are Fluid and Coordinated: No Fine Motor Movements are Fluid and Coordinated: Yes Motor  Motor Motor: Ataxia;Abnormal postural alignment and control Mobility     Trunk/Postural Assessment  Cervical Assessment Cervical Assessment: Within Functional Limits Thoracic Assessment Thoracic Assessment: Exceptions to [facility] (Left trunk shortening) Lumbar Assessment Lumbar Assessment: Within Functional Limits Postural Control Postural Control: Deficits on evaluation  Balance Balance Balance Assessed: Yes Static Standing Balance Static Standing - Level of Assistance: 4: Min assist Dynamic Standing Balance Dynamic Standing - Level of Assistance: 3: Mod assist Dynamic Standing - Comments: during ADL completion  Extremity/Trunk Assessment RUE Assessment RUE Assessment: Within Functional Limits LUE Assessment LUE Assessment: Within Functional Limits   See Function Navigator for Current Functional Status.   Refer to Care Plan for Long Term  Goals  Recommendations for other services: None    Discharge Criteria: Patient will be discharged from OT if patient refuses treatment 3 consecutive times without medical reason, if treatment goals not met, if there is a change in medical status, if patient makes no progress towards goals or if patient is discharged from hospital.  The above assessment, treatment plan, treatment alternatives and goals were discussed and mutually agreed upon: by patient  Skeet Simmer 01/03/2017, 6:39 PM

## 2017-01-03 NOTE — Plan of Care (Signed)
Problem: RH PAIN MANAGEMENT Goal: RH STG PAIN MANAGED AT OR BELOW PT'S PAIN GOAL <3 Outcome: Progressing No c/o pain     

## 2017-01-03 NOTE — Progress Notes (Signed)
[place] PHYSICAL MEDICINE & REHABILITATION     PROGRESS NOTE  Subjective/Complaints:  Pt seen laying in bed this AM.  He states he slept "okay".   He is ready to being with therapies. He had some nausea overnight.   ROS: Denies CP, SOB, Diarrhea.  Objective: Vital Signs: Blood pressure (!) 148/72, pulse 84, temperature 98 F (36.7 C), temperature source Oral, resp. rate 18, height 6\' 2"  (1.88 m), SpO2 95 %. Ct Head Wo Contrast  Result Date: 01/02/2017 CLINICAL DATA:  Dizziness and weakness. EXAM: CT HEAD WITHOUT CONTRAST TECHNIQUE: Contiguous axial images were obtained from the base of the skull through the vertex without intravenous contrast. COMPARISON:  CT of the head on 12/30/2016 and MRI of the brain on 12/31/2016. FINDINGS: Brain: Small low-density infarct in the left posterolateral medulla is visible by CT and was seen on recent MRI. No evidence of additional infarction, hemorrhage, hydrocephalus, extra-axial collection or mass lesion/mass effect. Vascular: No hyperdense vessel or unexpected calcification. Skull: Normal. Negative for fracture or focal lesion. Sinuses/Orbits: No acute finding. Other: None. IMPRESSION: The recently identified infarct in the left medulla is visible by CT. No additional acute findings. Electronically Signed   By: Irish LackGlenn  Yamagata M.D.   On: 01/02/2017 13:09    Recent Labs  01/01/17 0220 01/03/17 0512  WBC 12.8* 13.6*  HGB 14.9 15.1  HCT 44.3 45.6  PLT 261 249    Recent Labs  01/01/17 0220 01/03/17 0512  NA 131* 134*  K 3.9 4.0  CL 92* 92*  GLUCOSE 276* 173*  BUN 12 11  CREATININE 1.10 1.25*  CALCIUM 8.4* 8.3*   CBG (last 3)   Recent Labs  01/02/17 1706 01/02/17 2100 01/03/17 0644  GLUCAP 164* 235* 153*    Wt Readings from Last 3 Encounters:  12/31/16 (!) 147.9 kg (326 lb)    Physical Exam:  BP (!) 148/72 (BP Location: Right Arm)   Pulse 84   Temp 98 F (36.7 C) (Oral)   Resp 18   Ht 6\' 2"  (1.88 m)   SpO2 95%   Constitutional: He appears well-developed. Obese. HENT: Normocephalicand atraumatic.  Eyes: EOMI. No discharge.  Cardiovascular: Normal rateand regular rhythm. No JVD. Respiratory: Effort normaland breath sounds normal. No stridor.  GI: Soft. Bowel sounds are normal.  Musculoskeletal: No edema. No tenderness.  Neurological: He is alertand oriented. Motor: 4+-5/5 throughout Left hemiataxia, right hemisensory deficits, left facial dysesthesias Skin: Skin is warmand dry.  Psychiatric: He has a normal mood and affect. His behavior is normal. Thought contentnormal   Assessment/Plan: 1. 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 Physiatrist is providing close team supervision and 24 hour management of active medical problems listed below. Physiatrist and rehab team continue to assess barriers to discharge/monitor patient progress toward functional and medical goals.  Function:  Bathing Bathing position      Bathing parts      Bathing assist        Upper Body Dressing/Undressing Upper body dressing                    Upper body assist        Lower Body Dressing/Undressing Lower body dressing                                  Lower body assist  Toileting Toileting   Toileting steps completed by patient: Adjust clothing prior to toileting, Performs perineal hygiene, Adjust clothing after toileting   Toileting Assistive Devices: Grab bar or rail  Toileting assist Assist level: Supervision or verbal cues   Transfers Chair/bed Optician, dispensing          Cognition Comprehension Comprehension assist level: Follows complex conversation/direction with no assist  Expression Expression assist level: Expresses complex ideas: With no assist  Social Interaction Social  Interaction assist level: Interacts appropriately with others - No medications needed.  Problem Solving Problem solving assist level: Solves complex problems: Recognizes & self-corrects  Memory      Medical Problem List and Plan: 1. 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   Chart reviewed, CT head reviewed showing left medullary infarct.  Begin CIR 2. DVT Prophylaxis/Anticoagulation: Pharmaceutical: Lovenox 3. Pain Management: N/A 4. Mood: LCSW to follow for evaluation and support.  5. Neuropsych: This patient iscapable of making decisions on hisown behalf. 6. Skin/Wound Care: Routine pressure relief measures.  7. Fluids/Electrolytes/Nutrition: Monitor I/O.  8. HTN: Monitor BP bid  Permissive HTN for now  Will add medications as indicated if remains persistently high.  9. T2DM: Hgb A1C- 9.5. He reports that he has not been fully compliant with meds due to malaise for months. Monitor BS ac/hs. Continue lantus insulin for now--taper as BS improve.  Resumed Metformin BID and titrate upwards. Use SSI for elevated BS.   Monitor with increased mobility 10. Dyslipidemia: Now on Lipitor 11. Hyponatremia: Monitor intake.   Na+ 134 on 2/10 12. Leucocytosis: Reports recent bout of PNA. No respiratory symptoms reported.   WBCs 13.6 on 2/10  Cont to monitor 13 CKD?: SCr-1.34 at admission.   Cr. 1.25 on 2/10  Cont to monitor 14. Nausea/Dizziness: Ordered vestibular evaluation. Continue prn zofran for nausea.  15. Morbid obesity: Educated appropriate diet and on importance of weight loss to help promote overall health and function.   LOS (Days) 1 A FACE TO FACE EVALUATION WAS PERFORMED  Ankit Karis Juba 01/03/2017 7:20 AM

## 2017-01-03 NOTE — Plan of Care (Signed)
Problem: Food- and Nutrition-Related Knowledge Deficit (NB-1.1) Goal: Nutrition education Formal process to instruct or train a patient/client in a skill or to impart knowledge to help patients/clients voluntarily manage or modify food choices and eating behavior to maintain or improve health. Outcome: Completed/Met Date Met: 01/03/17  RD consulted by RN for nutrition education regarding some diabetes questions the patient had.   Lab Results  Component Value Date   HGBA1C 9.5 (H) 01/01/2017   Patient had miscellaneous questions regarding the diabetic diet. Specifically, he was worried that he was not allowed to have fruit, which he really enjoys. RD clarified that he was not talking about juice, rather the food form. He is taught that portion size is very important. Eating 1-2 servings of carbs as a snack should be fine. Certain fruits have more carbs and a larger serving size than others. RD presented patient with list of the serving sizes of all fruits so he could see which ones would raise his sugars the most. Additionally, RD recommended that he consume his fruit with protein or veggies to help dull the increase in BG once the fruit is consumed.   He had questions about potatoes vs sweet potatoes. Emphasized that both are starches and not appropriate in large amounts.   He sounded to have an issue with being overeating fruit if available. Recommended that he only prepare a certain amount of fruit at a time to reduce the temptation to overeat.    RD provided "Diabetes Type 2 Nutrition Therapy" handout from the Academy of Nutrition and Dietetics. Provided list of carbohydrates and recommended serving sizes of common foods.  Patient denied any other questions at this time.   Burtis Junes RD, LDN, CNSC Clinical Nutrition Pager: [phone] 01/03/2017 7:00 PM

## 2017-01-03 NOTE — IPOC Note (Signed)
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 (HCC)   Stage 3 chronic kidney disease   Hyponatremia   Type II diabetes mellitus, uncontrolled ([facility])   Benign essential HTN   Mixed hyperlipidemia     Functional Problem List: Nursing Behavior, Bladder, Bowel, Edema, Endurance, Medication Management, Motor, Nutrition, Pain, Perception, Safety, Sensory, Skin Integrity  PT Balance, Endurance, Motor, Pain, Perception, Sensory  OT Balance, Sensory, Vision, Endurance, Motor  SLP    TR         Basic ADL's: OT Bathing, Grooming, Dressing, Toileting     Advanced  ADL's: OT Simple Meal Preparation, Laundry, Light Housekeeping     Transfers: PT Bed Mobility, Bed to Chair, Car, Furniture, Civil Service fast [place]  OT Toilet, Research scientist (life sciences)Tub/Shower     Locomotion: PT Ambulation, Stairs     Additional Impairments: OT    SLP        TR      Anticipated Outcomes Item Anticipated Outcome  Self Feeding N/A  Swallowing      Basic self-care  Supervision-Mod I   Toileting  Mod I    Bathroom Transfers Mod I   Bowel/Bladder  contienet of bladder and bowel  Transfers  Mod I  Locomotion  Mod I  Communication     Cognition     Pain  less<2  Safety/Judgment  mod indip   Therapy Plan: PT Intensity: Minimum of 1-2 x/day ,45 to 90 minutes PT Frequency: 5 out of 7 days PT Duration Estimated Length of Stay: 12-14 days OT Intensity: Minimum of 1-2 x/day, 45 to 90 minutes OT Frequency: 5 out of 7 days OT Duration/Estimated Length of Stay: 12-14 days         Team Interventions: Nursing Interventions Patient/Family Education, Cognitive Remediation/Compensation, Bowel Management, Bladder Management, Pain Management, Disease Management/Prevention, Medication Management, Discharge Planning, Psychosocial Support  PT interventions Ambulation/gait  training, Warden/rangerBalance/vestibular training, Community reintegration, Discharge planning, Disease management/prevention, DME/adaptive equipment instruction, Functional mobility training, Neuromuscular re-education, Pain management, Patient/family education, Functional electrical stimulation, Psychosocial support, Stair training, Therapeutic Activities, Therapeutic Exercise, UE/LE Strength taining/ROM, UE/LE Coordination activities, Visual/perceptual remediation/compensation  OT Interventions Balance/vestibular training, Discharge planning, Self Care/advanced ADL retraining, Therapeutic Activities, UE/LE Coordination activities, Functional mobility training, Patient/family education, Therapeutic Exercise, Visual/perceptual remediation/compensation, DME/adaptive equipment instruction, Neuromuscular re-education, Psychosocial support, UE/LE Strength taining/ROM  SLP Interventions    TR Interventions    SW/CM Interventions      Team Discharge Planning: Destination: PT-Home ,OT- Home , SLP-  Projected Follow-up: PT-Outpatient PT, Other (comment) (intermittent supervision), OT-  None, SLP-  Projected Equipment Needs: PT-To be determined, OT- To be determined, SLP-  Equipment Details: PT- , OT-  Patient/family involved in discharge planning: PT- Patient,  OT-Patient, SLP-   MD ELOS: 10-14 days Medical Rehab Prognosis:  Good Assessment: 46 y.o.malewith history of HTN, T2DM, tobacco use (quit 6 weeks ago) who [facility] with left facial numbness, HA and unsteady gait. 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 2D echo with contrast revealed EF 55-60% and no wall abnormality or thrombus. Dr. Pearlean BrownieSethi recommended ASA and plavix for thrombotic stroke due to L-VA stenosis/large vessel disease. He reported  worsening of symptoms with diplopia  and nausea. CT head stable and he was treated with fluid bolus. Therapy ongoing and patient with ataxia affecting balance and gait with balance deficits and poor safety. Will set goals for Mod I with PT/OT.   See Team Conference Notes for weekly updates to the plan of care

## 2017-01-03 NOTE — Evaluation (Addendum)
Physical Therapy Assessment and Plan and Vestibular Evaluation  Patient Details  Name: Tavien Chestnut MRN: [id number] Date of Birth: 22-Jul-1971  PT Diagnosis: Abnormal posture, Abnormality of gait, Ataxia, Ataxic gait, Coordination disorder, Difficulty walking, Dizziness and giddiness, Impaired sensation and Vertigo of central origin Rehab Potential: Good ELOS: 12-14 days   Today's Date: 01/03/2017 PT Individual Time: 0758-0900 and 1303-1350 PT Individual Time Calculation (min): 62 min  And 47 min  Problem List:  Patient Active Problem List   Diagnosis Date Noted  . Stage 3 chronic kidney disease   . Hyponatremia   . Type II diabetes mellitus, uncontrolled ([place])   . Benign essential HTN   . Mixed hyperlipidemia   . 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 Stroke (cerebrum) ([place]) 12/31/2016  . Diabetes mellitus without complication ([place]) 50/01/7047  . AKI (acute kidney injury) ([place]) 12/31/2016  . Leukocytosis 12/31/2016  . Essential hypertension 12/31/2016    Past Medical History:  Past Medical History:  Diagnosis Date  . Diabetes mellitus without complication [facility])    Past Surgical History:  Past Surgical History:  Procedure Laterality Date  . EXTERNAL EAR SURGERY      Assessment & Plan Clinical Impression: Patient is a 46 y.o.malewith history of HTN, T2DM, tobacco use (quit 6 weeks ago) who was admitted to [facility] on 12/31/16 with left facial numbness, HA and unsteady gait. 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 2D echo with contrast revealed EF 55-60% and no wall abnormality or thrombus. Dr. Leonie Man recommended ASA and plavix for thrombotic stroke due to L-VA stenosis/large vessel disease.  Patient transferred to CIR on 01/02/2017 .   Patient currently requires  mod with mobility secondary to pain/nausea, decreased cardiorespiratoy endurance, impaired timing and sequencing, unbalanced muscle activation, ataxia, decreased coordination and decreased motor planning, diplopia, central origin and decreased sitting balance, decreased standing balance, decreased postural control and decreased balance strategies.  Prior to hospitalization, patient was independent  with mobility and lived with Alone in a [place] home.  Home access is 12-14Stairs to enter.  Patient will benefit from skilled PT intervention to maximize safe functional mobility, minimize fall risk and decrease caregiver burden for planned discharge home with intermittent assist.  Anticipate patient will benefit from follow up OP at discharge.  PT - End of Session Activity Tolerance: Decreased this session Endurance Deficit: Yes Endurance Deficit Description: nausea, fatigue PT Assessment Rehab Potential (ACUTE/IP ONLY): Good Barriers to Discharge: Inaccessible home environment;Decreased caregiver support PT Patient demonstrates impairments in the following area(s): Balance;Endurance;Motor;Pain;Perception;Sensory PT Transfers Functional Problem(s): Bed Mobility;Bed to Chair;Car;Furniture;Floor PT Locomotion Functional Problem(s): Ambulation;Stairs PT Plan PT Intensity: Minimum of 1-2 x/day ,45 to 90 minutes PT Frequency: 5 out of 7 days PT Duration Estimated Length of Stay: 12-14 days PT Treatment/Interventions: Ambulation/gait training;Balance/vestibular training;Community reintegration;Discharge planning;Disease management/prevention;DME/adaptive equipment instruction;Functional mobility training;Neuromuscular re-education;Pain management;Patient/family education;Functional electrical stimulation;Psychosocial support;Stair training;Therapeutic Activities;Therapeutic Exercise;UE/LE Strength taining/ROM;UE/LE Coordination activities;Visual/perceptual remediation/compensation PT Transfers Anticipated  Outcome(s): Mod I PT Locomotion Anticipated Outcome(s): Mod I PT Recommendation Recommendations for Other Services: Neuropsych consult Follow Up Recommendations: Outpatient PT;Other (comment) (intermittent supervision) Patient destination: Home Equipment Recommended: To be determined  Skilled Therapeutic Intervention AM session: pt seen for PT initial evaluation; see below.  Pt educated on PT clinical findings, CVA education, goals of PT and POC.  Pt verbalized agreement.  PM session:  Pt received in bed; pt reporting improvement in nausea symptoms; feels it may be related to Metformin.  Pt agreeable to vestibular evaluation 1. 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 Hallpike Dix N/T  Lt. Hallpike Dix N/T  Rt. Roll Test N/T  Lt. Roll Test  N/T  MSQ R knee to sitting: 2/5 L knee to sitting: 4/5 Head Turning x 5: 4/5 Head Nodding x 5: 5/5 Pivot R in standing: 2/5 Pivot L in standing: 4/5  Cover-Cross Cover (if indicated) Abnormal, L eye exotropia  Head-Neck Differentiation Test (if indicated) N/T    2. Findings:  Patient signs and symptoms consistent with  motion sensitivity, central vertigo, impaired gaze stability and impaired visual-vestibular interactions.  3. Recommendations for Treatment: Pt given handout on x 1 viewing for Habituation and to improve gaze stability during head movement; pt also instructed in convergence exercises.  PT  Evaluation Precautions/Restrictions Precautions Precautions: Fall Precaution Comments: monitor vitals, permissive HTN, DM Pain Pain Assessment Pain Score: 0-No pain Home Living/Prior Functioning Home Living Available Help at Discharge: Friend(s);Available PRN/intermittently;Family Type of Home: Apartment Home Access: Stairs to enter [employer] of Steps: 12-14 Entrance Stairs-Rails: Right Home Layout: One level (second story condo)  Lives With: Alone Prior Function Level of Independence: Independent with gait;Independent with transfers  Able to Take Stairs?: Yes Driving: Yes Vocation: Full time employment Comments: worked for [employer] on [address] (states walked 3 miles a day) Vision/Perception   Reporting diplopia Cognition Orientation Level: Oriented X4 Sensation Sensation Light Touch: Impaired Detail Light Touch Impaired Details: Impaired RUE;Impaired RLE Stereognosis: Not tested Hot/Cold: Impaired Detail Hot/Cold Impaired Details: Absent RUE;Absent RLE Proprioception: Appears Intact Coordination Gross Motor Movements are Fluid and Coordinated: No Coordination and Movement Description: slow, deliberate movements Finger Nose Finger Test: slight dysmetria Motor  Motor Motor: Ataxia;Abnormal postural alignment and control  Mobility Bed Mobility Bed Mobility: Not assessed Transfers Transfers: Yes Stand Pivot Transfers: 3: Mod assist Stand Pivot Transfer Details (indicate cue type and reason): W/C <> recliner and w/c <> simulated car with pt requiring mod A for balance when pivoting; pt with very slow, deliberate movements and bilat UE support on arm rests for increased sensory feedback of body position and balance Locomotion  Ambulation Ambulation: Yes Ambulation/Gait Assistance: 3: Mod assist Ambulation Distance (Feet): 75 Feet Assistive device: 1 person hand held assist Ambulation/Gait Assistance Details: Gait within room to/from toilet with  mod HHA and pt leaning into or touching wall with other hand for support and stabilization with 2 LOB with max A to recover; also performed gait in middle of gym with pt given cue to pick object to stabilize gaze on during gait.  Pt ambulates very slowly with deliberate steps.   Gait Gait: Yes Gait Pattern: Impaired Gait Pattern: Decreased stance time - right;Decreased  stride length;Decreased weight shift to right;Lateral trunk lean to left;Wide base of support;Ataxic Stairs / Additional Locomotion Stairs: Yes Stairs Assistance: 4: Min assist Stair Management Technique: Two rails;Alternating pattern;Forwards Number of Stairs: 12 Height of Stairs: 6 Architect: Yes Wheelchair Assistance: 5: Careers information officer: Both lower extermities Wheelchair Parts Management: Needs assistance Distance: 150  Trunk/Postural Assessment  Cervical Assessment Cervical Assessment: Exceptions to WFL (flexion, L lateral flexion) Thoracic Assessment Thoracic Assessment: Exceptions to [facility] (L trunk shortening) Lumbar Assessment Lumbar Assessment: Within Functional Limits Postural Control Postural Control: Deficits on evaluation (L lateropulsion, impaired subjective vertical)  Balance Balance Balance Assessed: Yes Static Sitting Balance Static Sitting - Balance Support: Feet supported Static Sitting - Level of Assistance: 5: Stand by assistance Dynamic Sitting Balance Dynamic Sitting - Balance Support: Feet supported Dynamic Sitting - Level of Assistance: 4: Min assist Static Standing Balance Static Standing - Balance Support: Right upper extremity supported;Left upper extremity supported Static Standing - Level of Assistance: 4: Min assist Dynamic Standing Balance Dynamic Standing - Balance Support: Right upper extremity supported;Left upper extremity supported Dynamic Standing - Level of Assistance: 3: Mod assist Extremity Assessment   RLE Assessment RLE  Assessment: Within Functional Limits (strength 5/5; poor motor control/ataxia) LLE Assessment LLE Assessment: Within Functional Limits   See Function Navigator for Current Functional Status.   Refer to Care Plan for Long Term Goals  Recommendations for other services: Neuropsych and Therapeutic Recreation  Kitchen group and Outing/community reintegration  Discharge Criteria: Patient will be discharged from PT if patient refuses treatment 3 consecutive times without medical reason, if treatment goals not met, if there is a change in medical status, if patient makes no progress towards goals or if patient is discharged from hospital.  The above assessment, treatment plan, treatment alternatives and goals were discussed and mutually agreed upon: by patient  Raylene Everts, PT, DPT 01/03/17    4:01 PM

## 2017-01-04 ENCOUNTER — Inpatient Hospital Stay (HOSPITAL_COMMUNITY): Payer: BLUE CROSS/BLUE SHIELD | Admitting: Physical Therapy

## 2017-01-04 DIAGNOSIS — N182 Chronic kidney disease, stage 2 (mild): Secondary | ICD-10-CM

## 2017-01-04 DIAGNOSIS — I69393 Ataxia following cerebral infarction: Secondary | ICD-10-CM

## 2017-01-04 LAB — GLUCOSE, CAPILLARY
GLUCOSE-CAPILLARY: 209 mg/dL — AB (ref 65–99)
GLUCOSE-CAPILLARY: 268 mg/dL — AB (ref 65–99)
GLUCOSE-CAPILLARY: 176 mg/dL — AB (ref 65–99)
Glucose-Capillary: 250 mg/dL — ABNORMAL HIGH (ref 65–99)

## 2017-01-04 MED ORDER — METFORMIN HCL ER 500 MG PO TB24
1000.0000 mg | ORAL_TABLET | Freq: Two times a day (BID) | ORAL | Status: DC
  Administered 2017-01-04 – 2017-01-06 (×5): 1000 mg via ORAL
  Filled 2017-01-04 (×6): qty 2

## 2017-01-04 NOTE — Progress Notes (Signed)
[facility] PHYSICAL MEDICINE & REHABILITATION     PROGRESS NOTE  Subjective/Complaints:  Pt seen laying in bed this AM.  He slept well overnight.  He states he had a good first day of therapies.   ROS: Denies CP, SOB, Diarrhea.  Objective: Vital Signs: Blood pressure (!) 171/82, pulse 96, temperature 98.7 F (37.1 C), temperature source Oral, resp. rate 18, height 6\' 2"  (1.88 m), SpO2 96 %. Ct Head Wo Contrast  Result Date: 01/02/2017 CLINICAL DATA:  Dizziness and weakness. EXAM: CT HEAD WITHOUT CONTRAST TECHNIQUE: Contiguous axial images were obtained from the base of the skull through the vertex without intravenous contrast. COMPARISON:  CT of the head on 12/30/2016 and MRI of the brain on 12/31/2016. FINDINGS: Brain: Small low-density infarct in the left posterolateral medulla is visible by CT and was seen on recent MRI. No evidence of additional infarction, hemorrhage, hydrocephalus, extra-axial collection or mass lesion/mass effect. Vascular: No hyperdense vessel or unexpected calcification. Skull: Normal. Negative for fracture or focal lesion. Sinuses/Orbits: No acute finding. Other: None. IMPRESSION: The recently identified infarct in the left medulla is visible by CT. No additional acute findings. Electronically Signed   By: Irish Lack M.D.   On: 01/02/2017 13:09    Recent Labs  01/03/17 0512  WBC 13.6*  HGB 15.1  HCT 45.6  PLT 249    Recent Labs  01/03/17 0512  NA 134*  K 4.0  CL 92*  GLUCOSE 173*  BUN 11  CREATININE 1.25*  CALCIUM 8.3*   CBG (last 3)   Recent Labs  01/03/17 1656 01/03/17 2122 01/04/17 0632  GLUCAP 235* 135* 209*    Wt Readings from Last 3 Encounters:  12/31/16 (!) 147.9 kg (326 lb)    Physical Exam:  BP (!) 171/82 (BP Location: Right Arm)   Pulse 96   Temp 98.7 F (37.1 C) (Oral)   Resp 18   Ht 6\' 2"  (1.88 m)   SpO2 96%  Constitutional: He appears well-developed. Obese. HENT: Normocephalicand atraumatic.  Eyes: EOMI. No  discharge.  Cardiovascular: Normal rateand regular rhythm. No JVD. Respiratory: Effort normal and breath sounds normal. No stridor.  GI: Soft. Bowel sounds are normal.  Musculoskeletal: No edema. No tenderness.  Neurological: He is alertand oriented. Motor: 5/5 throughout  Left hemiataxia, right hemisensory deficits, left facial dysesthesias Skin: Skin is warmand dry.  Psychiatric: He has a normal mood and affect. His behavior is normal. Thought contentnormal   Assessment/Plan: 1. 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 Physiatrist is providing close team supervision and 24 hour management of active medical problems listed below. Physiatrist and rehab team continue to assess barriers to discharge/monitor patient progress toward functional and medical goals.  Function:  Bathing Bathing position   Position: Shower  Bathing parts Body parts bathed by patient: Right arm, Left arm, Chest, Abdomen, Front perineal area, Buttocks, Right upper leg, Left upper leg Body parts bathed by helper: Right lower leg, Left lower leg, Back  Bathing assist        Upper Body Dressing/Undressing Upper body dressing   What is the patient wearing?: Pull over shirt/dress     Pull over shirt/dress - Perfomed by patient: Thread/unthread right sleeve, Thread/unthread left sleeve, Put head through opening, Pull shirt over trunk          Upper body assist Assist Level: Set up      Lower Body Dressing/Undressing Lower  body dressing   What is the patient wearing?: Underwear, Pants, Non-skid slipper socks, Shoes Underwear - Performed by patient: Thread/unthread right underwear leg, Thread/unthread left underwear leg, Pull underwear up/down   Pants- Performed by patient: Thread/unthread right pants leg, Pull pants up/down, Thread/unthread left pants leg   Non-skid slipper socks-  Performed by patient: Don/doff right sock Non-skid slipper socks- Performed by helper: Don/doff left sock     Shoes - Performed by patient: Don/doff right shoe, Fasten right Shoes - Performed by helper: Don/doff left shoe, Fasten left          Lower body assist Assist for lower body dressing: Touching or steadying assistance (Pt > 75%)      Toileting Toileting   Toileting steps completed by patient: Adjust clothing prior to toileting, Performs perineal hygiene, Adjust clothing after toileting   Toileting Assistive Devices: Grab bar or rail  Toileting assist Assist level: Supervision or verbal cues   Transfers Chair/bed transfer   Chair/bed transfer method: Stand pivot Chair/bed transfer assist level: Moderate assist (Pt 50 - 74%/lift or lower) Chair/bed transfer assistive device: Armrests     Locomotion Ambulation     Max distance: 75 Assist level: Moderate assist (Pt 50 - 74%)   Wheelchair   Type: Manual Max wheelchair distance: 150 Assist Level: Supervision or verbal cues  Cognition Comprehension Comprehension assist level: Follows complex conversation/direction with no assist  Expression Expression assist level: Expresses complex ideas: With no assist  Social Interaction Social Interaction assist level: Interacts appropriately with others - No medications needed.  Problem Solving Problem solving assist level: Solves complex problems: Recognizes & self-corrects  Memory      Medical Problem List and Plan: 1. 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   Cont CIR 2. DVT Prophylaxis/Anticoagulation: Pharmaceutical: Lovenox 3. Pain Management: N/A 4. Mood: LCSW to follow for evaluation and support.  5. Neuropsych: This patient iscapable of making decisions on hisown behalf. 6. Skin/Wound Care: Routine pressure relief measures.  7. Fluids/Electrolytes/Nutrition: Monitor I/O.  8. HTN: Monitor BP bid  Permissive HTN for now, will start  medications tomorrow  Will add medications as indicated if remains persistently high.  9. T2DM: Hgb A1C- 9.5. He reports that he has not been fully compliant with meds due to malaise for months. Monitor BS ac/hs.   Metformin increased to 1000 BID on 2/11  Use SSI for elevated BS.   Monitor with increased mobility 10. Dyslipidemia: Now on Lipitor 11. Hyponatremia: Monitor intake.   Na+ 134 on 2/10 12. Leucocytosis: Reports recent bout of PNA. No respiratory symptoms reported.   WBCs 13.6 on 2/10  Cont to monitor 13 CKD?: SCr-1.34 at admission.   Cr. 1.25 on 2/10  Cont to monitor 14. Nausea/Dizziness: Ordered vestibular evaluation.  Continue prn zofran for nausea.  15. Morbid obesity: Educated appropriate diet and on importance of weight loss to help promote overall health and function.   LOS (Days) 2 A FACE TO FACE EVALUATION WAS PERFORMED  Hailie Searight Karis Jubanil Edmar Blankenburg 01/04/2017 8:03 AM

## 2017-01-04 NOTE — Progress Notes (Signed)
Physical Therapy Session Note  Patient Details  Name: Nicholas Mejia MRN: [id number] Date of Birth: May 20, 1971  Today's Date: 01/04/2017 PT Individual Time: 1345-1415 PT Individual Time Calculation (min): 30 min   Short Term Goals: Week 1:  PT Short Term Goal 1 (Week 1): Pt will perform bed mobility and bed <> chair transfers supervision PT Short Term Goal 2 (Week 1): Pt will ambulate x 150' with min A and reporting 50% less dizziness/nausea PT Short Term Goal 3 (Week 1): Pt will negotiate 12 stairs with R rail and min A PT Short Term Goal 4 (Week 1): Pt will perform vestibular and visual habituation exercises with supervision  Skilled Therapeutic Interventions/Progress Updates:  Pt was seen bedside in the pm. Pt sitting on edge of bed. Pt performed all sit to stand transfers with S. Pt ambulated 70 feet x 2 and 125 feet x 2 without assistive device and c/s to min guard. Pt ambulated with wide BOS and fluctuating step length. Pt returned to room and left sitting on edge of bed with all needs within reach.   Therapy Documentation Precautions:  Precautions Precautions: Fall Precaution Comments: monitor vitals, permissive HTN, DM, left LOBs Restrictions Weight Bearing Restrictions: No General:   Pain: No c/o pain.   See Function Navigator for Current Functional Status.   Therapy/Group: Individual Therapy  Rayford HalstedMitchell, Nicholas Mejia 01/04/2017, 3:35 PM

## 2017-01-04 NOTE — Progress Notes (Addendum)
Patient felt nauseated when he got up this morning to use the bathroom. Vomited x1 ( mostly spit). Zofran po given. Patient requested to lay back down in bed. Will monitor.

## 2017-01-05 ENCOUNTER — Inpatient Hospital Stay (HOSPITAL_COMMUNITY): Payer: 59 | Admitting: Occupational Therapy

## 2017-01-05 ENCOUNTER — Inpatient Hospital Stay (HOSPITAL_COMMUNITY): Payer: 59 | Admitting: Physical Therapy

## 2017-01-05 ENCOUNTER — Inpatient Hospital Stay (HOSPITAL_COMMUNITY): Payer: BLUE CROSS/BLUE SHIELD

## 2017-01-05 DIAGNOSIS — R112 Nausea with vomiting, unspecified: Secondary | ICD-10-CM | POA: Insufficient documentation

## 2017-01-05 LAB — GLUCOSE, CAPILLARY
GLUCOSE-CAPILLARY: 225 mg/dL — AB (ref 65–99)
GLUCOSE-CAPILLARY: 188 mg/dL — AB (ref 65–99)
GLUCOSE-CAPILLARY: 206 mg/dL — AB (ref 65–99)
Glucose-Capillary: 198 mg/dL — ABNORMAL HIGH (ref 65–99)

## 2017-01-05 MED ORDER — LISINOPRIL 5 MG PO TABS
5.0000 mg | ORAL_TABLET | Freq: Every day | ORAL | Status: DC
  Administered 2017-01-05 – 2017-01-06 (×2): 5 mg via ORAL
  Filled 2017-01-05 (×2): qty 1

## 2017-01-05 NOTE — Progress Notes (Signed)
Patient information reviewed and entered into eRehab system by Zaidin Blyden, RN, CRRN, PPS Coordinator.  Information including medical coding and functional independence measure will be reviewed and updated through discharge.    

## 2017-01-05 NOTE — Progress Notes (Signed)
Social Work Assessment and Plan Social Work Assessment and Plan  Patient Details  Name: Nicholas Mejia MRN: [id number] Date of Birth: 1971-08-27  Today's Date: 01/05/2017  Problem List:  Patient Active Problem List   Diagnosis Date Noted  . Non-intractable vomiting with nausea   . Stage 2 chronic kidney disease   . Ataxia, post-stroke   . Stage 3 chronic kidney disease   . Hyponatremia   . Type II diabetes mellitus, uncontrolled (HCC)   . Benign essential HTN   . Mixed hyperlipidemia   . 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 Diabetes mellitus without complication (HCC) 12/31/2016  . AKI (acute kidney injury) (HCC) 12/31/2016  . Leukocytosis 12/31/2016  . Essential hypertension 12/31/2016   Past Medical History:  Past Medical History:  Diagnosis Date  . Diabetes mellitus without complication [facility])    Past Surgical History:  Past Surgical History:  Procedure Laterality Date  . EXTERNAL EAR SURGERY     Social History:  reports that he has quit smoking. He does not have any smokeless tobacco history on file. He reports that he drinks alcohol. He reports that he does not use drugs.  Family / Support Systems Marital Status: Single Patient Roles: Other (Comment) [facility]) Other Supports: Nicholas [employer]ia  [phone]  [phone] Anticipated Caregiver: Self Ability/Limitations of Caregiver: Mom lives in [place], [place] was recently here as a patient and his has friends who will come by and check on him Caregiver Availability: Intermittent Family Dynamics: Close with Mom and friends who will come by and check on him. He really needs to be mod/i to be able to go home safely. He was recovery from pneumonia prior to being admitted for his stroke.  Social History Preferred language: English Religion: Baptist Cultural Background: No issues Education: Automotive engineer educated Read: Yes Write:  Yes Employment Status: Employed Name of Employer: [employer] Return to Work Plans: Plans to return to work when recovered from this stroke. Boss very supportive of him and has visited him here. Legal Hisotry/Current Legal Issues: No issues Guardian/Conservator: None-according to MD pt is capable of making his own decisions while here.   Abuse/Neglect Physical Abuse: Denies Verbal Abuse: Denies Sexual Abuse: Denies Exploitation of patient/patient's resources: Denies Self-Neglect: Denies  Emotional Status Pt's affect, behavior adn adjustment status: Pt is motivated to do well he still is processing the fact he had a stroke. He thought he was too young to have this. His uncle was here recently after having a stroke also and pt visited him and assisted with bringing him things while here. Recent Psychosocial Issues: other health issues-recently treated for pneumonia Pyschiatric History: No history deferred depression screen due to seems to be coping appropriately. Although would benefit from seeing neuro-psych while here due to young age. Will ask him to see pt while here. Substance Abuse History: Tobacco-had quit 6 weeks prior when had pnuemonia. Still plans to not smoking realizes how bad it is for his health.  Patient / Family Perceptions, Expectations & Goals Pt/Family understanding of illness & functional limitations: Pt is able to explain his stroke and deficits. He does talk with the MD daily and feels he has a good understanding of his treatment plan. He has made progress which is encouraging to him and gives him hope. 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, Human resources officer, friend, co-worker, nephew, etc Anticipated changes in roles/activities/participation: resume Pt/family expectations/goals: Pt states: " I want to be able to take care of  myself before I leave here."  Mom states: " I hope he does well here and can come and stay for a week or two if I need too."  [employer]  Resources Community Agencies: None Premorbid Home Care/DME Agencies: None Transportation available at discharge: Friends Resource referrals recommended: Neuropsychology, Support group (specify)  Discharge Planning Living Arrangements: Alone Support Systems: Parent, Friends/neighbors Type of Residence: Private residence Civil engineer, contractingnsurance Resources: Media plannerrivate Insurance (specify) (BCBS of [place]) Financial Resources: Employment Financial Screen Referred: No Living Expenses: Psychologist, sport and exerciseent Money Management: Patient Does the patient have any problems obtaining your medications?: No Home Management: Patient Patient/Family Preliminary Plans: Return home with intermittent assist from friends and MOm can come and stay for a short time if necessary. Will work toward his goals of mod/i level and see if he reaches these while here, so he will be safe home alone at discharge. Social Work Anticipated Follow Up Needs: HH/OP, Support Group  Clinical Impression Pleasant gentleman who is still reeling the fact he had a stroke. He is making progress which is encouraging to him and gives him hope he will get to the level he can go home alone when leaving here. He will have friends checking in on him but can not stay. His Mom can come if needed but he does not want her to have too come. Will await progress and have neuro-psych see for coping while here. Work on discharge needs.  Nicholas Mejia, Nicholas Mejia G 01/05/2017, 12:55 PM

## 2017-01-05 NOTE — Progress Notes (Signed)
Physical Therapy Session Note  Patient Details  Name: Nicholas Mejia MRN: [id number] Date of Birth: 10-17-1971  Today's Date: 01/05/2017 PT Individual Time: 1100-1200 PT Individual Time Calculation (min): 60 min   Short Term Goals: Week 1:  PT Short Term Goal 1 (Week 1): Pt will perform bed mobility and bed <> chair transfers supervision PT Short Term Goal 2 (Week 1): Pt will ambulate x 150' with min A and reporting 50% less dizziness/nausea PT Short Term Goal 3 (Week 1): Pt will negotiate 12 stairs with R rail and min A PT Short Term Goal 4 (Week 1): Pt will perform vestibular and visual habituation exercises with supervision  Skilled Therapeutic Interventions/Progress Updates:    Pt initially reporting not feeling well and exhausted from vomitting earlier this AM. Reports mild issues with dizziness today overall. Agreeable to initiate treatment session with habituation exercises. Pt completed 30 seconds each of horizontal and vertical gaze stabilization exercises 1 x with R eye covered, 1 x L eye covered, and then both eyes open per pt's paper HEP handout. Pt declined performing in standing. Agreeable to leave room and focus on mobility and balance. Focused on addressing gait and neuro re-ed for balance re-training and forced use and increasing weightbearing on the L. Pt reporting some "tickling" sensation in LLE today. Pt instructed in alternating toe taps on 6" step with min assist for balance, decreased stance on L noted. Focused on functional standing and reaching task with 4" step under RLE and sit <> stands with R foot further in front with focus on eccentric control. Static standing on foam surface for balance strategy re-training x 2 reps about 45 sec - 1 min each trial with min assist. Pt overall min assist for gait without AD with mild LOB to L during turns with min assist to recover and cues for attention to foot placement during turning and slowing speed. End of session set up in recliner  with all needs in reach awaiting lunch tray. Pt denies dizziness throughout session.    Therapy Documentation Precautions:  Precautions Precautions: Fall Precaution Comments: monitor vitals, permissive HTN, DM, left LOBs Restrictions Weight Bearing Restrictions: No  Pain: Reports nausea and vomitting being an issue this morning. Pt attributes it to the vegetables he had eaten yesterday, not vestibular symptoms per report.    See Function Navigator for Current Functional Status.   Therapy/Group: Individual Therapy  Karolee StampsGray, Nicholas Mejia  Nicholas Mejia, PT, DPT  01/05/2017, 12:13 PM

## 2017-01-05 NOTE — Progress Notes (Signed)
Physical Therapy Session Note  Patient Details  Name: Nicholas Mejia MRN: [id number] Date of Birth: 11-22-1971  Today's Date: 01/05/2017 PT Individual Time: 1400-1515 PT Individual Time Calculation (min): 75 min   Short Term Goals: Week 1:  PT Short Term Goal 1 (Week 1): Pt will perform bed mobility and bed <> chair transfers supervision PT Short Term Goal 2 (Week 1): Pt will ambulate x 150' with min A and reporting 50% less dizziness/nausea PT Short Term Goal 3 (Week 1): Pt will negotiate 12 stairs with R rail and min A PT Short Term Goal 4 (Week 1): Pt will perform vestibular and visual habituation exercises with supervision  Skilled Therapeutic Interventions/Progress Updates:    no c/o pain, reports dizziness is much improved since this AM.  Session focus on L NMR, balance, postural control, gait without device, and activity tolerance.    Pt ambulates to and from therapy gym with no AD with close supervision, intermittent steady assist when turning.  Gait training with 3# ankle weight to L ankle for neurofeedback x100' with improvement in LLE coordination.  Toe taps to target in semicircle with LLE and RLE in semicircle pattern with min assist for balance.  NMR and strengthening with 2x15 reps of hip abduction against level 3 theraband, L clam shells with level 3 theraband, and bridges with adductor hold.  NMR in standing on 4" wedge focus on balance, hip strategy, and trunk rotation during horseshoes reaching task.  NMR in sitting focus on postural control and anticipatory reactions/adjustments for weight ball toss/catch without LE support.  Nustep x10 minutes at level 4 with 4 extremities for reciprocal stepping pattern retraining, forced use, and cardiovascular endurance.  Pt returned to room at end of session and positioned seated EOB with call bell in reach and needs met.   Therapy Documentation Precautions:  Precautions Precautions: Fall Precaution Comments: monitor vitals, permissive  HTN, DM, left LOBs Restrictions Weight Bearing Restrictions: No  See Function Navigator for Current Functional Status.   Therapy/Group: Individual Therapy  Earnest Conroy Penven-Crew 01/05/2017, 3:02 PM

## 2017-01-05 NOTE — Progress Notes (Signed)
[place] PHYSICAL MEDICINE & REHABILITATION     PROGRESS NOTE  Subjective/Complaints:  Pt seen laying in bed this AM. He states he had a rough night with nausea and vomiting early this AM.  He states he took his nausea medication too late.    ROS: +Nausea. Denies CP, SOB, Diarrhea.  Objective: Vital Signs: Blood pressure (!) 168/97, pulse 97, temperature 98.5 F (36.9 C), temperature source Oral, resp. rate 18, height 6\' 2"  (1.88 m), SpO2 98 %. No results found.  Recent Labs  01/03/17 0512  WBC 13.6*  HGB 15.1  HCT 45.6  PLT 249    Recent Labs  01/03/17 0512  NA 134*  K 4.0  CL 92*  GLUCOSE 173*  BUN 11  CREATININE 1.25*  CALCIUM 8.3*   CBG (last 3)   Recent Labs  01/04/17 1620 01/04/17 2102 01/05/17 0622  GLUCAP 268* 176* 225*    Wt Readings from Last 3 Encounters:  12/31/16 (!) 147.9 kg (326 lb)    Physical Exam:  BP (!) 168/97 (BP Location: Right Arm)   Pulse 97   Temp 98.5 F (36.9 C) (Oral)   Resp 18   Ht 6\' 2"  (1.88 m)   SpO2 98%  Constitutional: He appears well-developed. Obese. HENT: Normocephalicand atraumatic.  Eyes: EOMI. No discharge.  Cardiovascular: RRR. No JVD. Respiratory: Effort normal and breath sounds normal. No stridor.  GI: Soft. Bowel sounds are normal.  Musculoskeletal: No edema. No tenderness.  Neurological: He is alertand oriented. Motor: 5/5 throughout. Left ataxia Skin: Skin is warmand dry.  Psychiatric: He has a normal mood and affect. His behavior is normal. Thought contentnormal   Assessment/Plan: 1. 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 Physiatrist is providing close team supervision and 24 hour management of active medical problems listed below. Physiatrist and rehab team continue to assess barriers to discharge/monitor patient progress toward functional and medical  goals.  Function:  Bathing Bathing position   Position: Shower  Bathing parts Body parts bathed by patient: Right arm, Left arm, Chest, Abdomen, Front perineal area, Buttocks, Right upper leg, Left upper leg Body parts bathed by helper: Right lower leg, Left lower leg, Back  Bathing assist        Upper Body Dressing/Undressing Upper body dressing   What is the patient wearing?: Pull over shirt/dress     Pull over shirt/dress - Perfomed by patient: Thread/unthread right sleeve, Thread/unthread left sleeve, Put head through opening, Pull shirt over trunk          Upper body assist Assist Level: Set up      Lower Body Dressing/Undressing Lower body dressing   What is the patient wearing?: Underwear, Pants, Non-skid slipper socks, Shoes Underwear - Performed by patient: Thread/unthread right underwear leg, Thread/unthread left underwear leg, Pull underwear up/down   Pants- Performed by patient: Thread/unthread right pants leg, Pull pants up/down, Thread/unthread left pants leg   Non-skid slipper socks- Performed by patient: Don/doff right sock Non-skid slipper socks- Performed by helper: Don/doff left sock     Shoes - Performed by patient: Don/doff right shoe, Fasten right Shoes - Performed by helper: Don/doff left shoe, Fasten left          Lower body assist Assist for lower body dressing: Touching or steadying assistance (Pt > 75%)      Toileting Toileting   Toileting steps completed by patient: Adjust clothing prior to toileting, Performs  perineal hygiene, Adjust clothing after toileting   Toileting Assistive Devices: Grab bar or rail  Toileting assist Assist level: Supervision or verbal cues   Transfers Chair/bed transfer   Chair/bed transfer method: Stand pivot Chair/bed transfer assist level: Moderate assist (Pt 50 - 74%/lift or lower) Chair/bed transfer assistive device: Armrests     Locomotion Ambulation     Max distance: 125 Assist level: Touching  or steadying assistance (Pt > 75%)   Wheelchair   Type: Manual Max wheelchair distance: 150 Assist Level: Supervision or verbal cues  Cognition Comprehension Comprehension assist level: Follows complex conversation/direction with no assist  Expression Expression assist level: Expresses complex ideas: With no assist  Social Interaction Social Interaction assist level: Interacts appropriately with others - No medications needed.  Problem Solving Problem solving assist level: Solves complex problems: Recognizes & self-corrects  Memory Memory assist level: More than reasonable amount of time    Medical Problem List and Plan: 1. 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   Cont CIR 2. DVT Prophylaxis/Anticoagulation: Pharmaceutical: Lovenox 3. Pain Management: N/A 4. Mood: LCSW to follow for evaluation and support.  5. Neuropsych: This patient iscapable of making decisions on hisown behalf. 6. Skin/Wound Care: Routine pressure relief measures.  7. Fluids/Electrolytes/Nutrition: Monitor I/O.  8. HTN: Monitor BP bid  Lisinopril 5 started 2/12  Will add medications as indicated if remains persistently high.  9. T2DM: Hgb A1C- 9.5. He reports that he has not been fully compliant with meds due to malaise for months. Monitor BS ac/hs.   Metformin increased to 1000 BID on 2/11  Will likely require further adjustment tomorrow.  Use SSI for elevated BS.   Monitor with increased mobility 10. Dyslipidemia: Now on Lipitor 11. Hyponatremia: Monitor intake.   Na+ 134 on 2/10 12. Leucocytosis: Reports recent bout of PNA. No respiratory symptoms reported.   WBCs 13.6 on 2/10  Cont to monitor  Labs ordered for tomorrow 13 CKD?: SCr-1.34 at admission.   Cr. 1.25 on 2/10  Cont to monitor  Labs ordered for tomorrow 14. Nausea/Dizziness: Ordered vestibular evaluation.  Continue prn zofran for nausea.  15. Morbid obesity: Educated appropriate diet and on importance of  weight loss to help promote overall health and function.   LOS (Days) 3 A FACE TO FACE EVALUATION WAS PERFORMED  Ankit Karis Jubanil Patel 01/05/2017 8:39 AM

## 2017-01-05 NOTE — Progress Notes (Signed)
Retta Diones, RN Rehab Admission Coordinator Signed Physical Medicine and Rehabilitation  PMR Pre-admission Date of Service: 01/02/2017 11:51 AM  Related encounter: ED to Hosp-Admission (Discharged) from 12/30/2016 in [place]       [] Hide copied text PMR Admission Coordinator Pre-Admission Assessment  Patient: Nicholas Mejia is an 46 y.o., male MRN: [id number] DOB: [birth year] Height: 6' 2"  (188 cm) Weight: (!) 147.9 kg (326 lb)                                                                                                                                                  Insurance Information HMO:      PPO: Yes     PCP:       IPA:       80/20:       OTHER:  Group # [id number] PRIMARY: BCBS       Policy#:  [id number]      Subscriber:  Sherran Needs CM Name: Any RN      Phone#: [phone]     Fax#:   Pre-Cert#: [id number] for 7 days      Employer:  [employer] Benefits:  Phone #: [phone]     Name:  FT Eff. Date:  02/25/16     Deduct:  $700 (met $174.84)      Out of Pocket Max:  $4000 (met $239.63)      Life Max:  Unlimited CIR:  80%      SNF: 80%/20% Outpatient: 80%     Co-Pay: 20% Home Health: 80% with 120 visits     Co-Pay: 20% DME: 80%     Co-Pay: 20% Providers: in network  SECONDARY:  [employer]      Policy#: [id number]      Subscriber:  Sherran Needs CM Name:        Phone#:       Fax#:   Pre-Cert#: Spect[employer] vision plan only - no medical benefits       Employer:   Benefits:  Phone #:       Name:   Eff. Date:       Deduct:        Out of Pocket Max:        Life Max:   CIR:        SNF:   Outpatient:       Co-Pay:   Home Health:        Co-Pay:   DME:      Co-Pay:   Emergency Contact Information        Contact Information    Name Relation Home Work [place] Mother [phone]  [phone]     Current Medical History  Patient Admitting Diagnosis: Wallenberg syndrome with left hemiataxia, right hemisensory deficits, left facial  dysesthesias, gait disorder   History of  Present Illness: A 46 y.o.malewith history of HTN, T2DM, tobacco use (quit 6 weeks ago) who was admitted to [facility] on 12/31/16 with left facial numbness, HA and unsteady gait. 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 2D echo with contrast revealed EF 55-60% and no wall abnormality or thrombus. Dr. Leonie Man recommended ASA and plavix for thrombotic stroke due to L-VA stenosis/large vessel disease. Therapy ongoing and patient with ataxia affecting balance and gait with balance deficits and poor safety. CIR recommended for follow up therapy.    Total: 2=NIH  Past Medical History      Past Medical History:  Diagnosis Date  . Diabetes mellitus without complication ([place])     Family History  family history includes Gout in his brother; Heart attack in his father.  Prior Rehab/Hospitalizations: No previous rehab admissions.  Has the patient had major surgery during 100 days prior to admission? No  Current Medications   Current Facility-Administered Medications:  .  0.9 %  sodium chloride infusion, , Intravenous, Continuous, Ivor Costa, MD, Last Rate: 100 mL/hr at 12/31/16 0325 .  acetaminophen (TYLENOL) tablet 650 mg, 650 mg, Oral, Q4H PRN **OR** acetaminophen (TYLENOL) solution 650 mg, 650 mg, Per Tube, Q4H PRN **OR** acetaminophen (TYLENOL) suppository 650 mg, 650 mg, Rectal, Q4H PRN, Ivor Costa, MD .  aspirin suppository 300 mg, 300 mg, Rectal, Daily **OR** aspirin tablet 325 mg, 325 mg, Oral, Daily, Donzetta Starch, NP, 325 mg at 01/02/17 0910 .  atorvastatin (LIPITOR) tablet 40 mg, 40 mg, Oral, q1800, Ivor Costa, MD, 40 mg at 01/01/17 1738 .  clopidogrel (PLAVIX) tablet 75 mg, 75 mg, Oral, Daily, Donzetta Starch, NP, 75 mg at 01/02/17 0910 .  enoxaparin (LOVENOX)  injection 40 mg, 40 mg, Subcutaneous, Q24H, Ivor Costa, MD, 40 mg at 01/02/17 0456 .  insulin aspart (novoLOG) injection 0-9 Units, 0-9 Units, Subcutaneous, TID WC, Ivor Costa, MD, 5 Units at 01/02/17 1137 .  ondansetron (ZOFRAN) injection 4 mg, 4 mg, Intravenous, Q6H PRN, Reyne Dumas, MD, 4 mg at 01/02/17 0455 .  senna-docusate (Senokot-S) tablet 1 tablet, 1 tablet, Oral, QHS PRN, Ivor Costa, MD  Patients Current Diet: Diet heart healthy/carb modified Room service appropriate? Yes; Fluid consistency: Thin Diet - low sodium heart healthy  Precautions / Restrictions Precautions Precautions: Fall Precaution Comments: imbalance to left during mobility.   Has the patient had 2 or more falls or a fall with injury in the past year?No  Prior Activity Level Community (5-7x/wk): Went out daily.  worked [employer], was driving.  Walked 3 miles a day in his job.  Home Assistive Devices / Equipment Home Assistive Devices/Equipment: None Home Equipment: None  Prior Device Use: Indicate devices/aids used by the patient prior to current illness, exacerbation or injury? None  Prior Functional Level Prior Function Level of Independence: Independent Comments: worked for [employer] on [place] (states walked 3 miles a day)  Self Care: Did the patient need help bathing, dressing, using the toilet or eating?  Independent  Indoor Mobility: Did the patient need assistance with walking from room to room (with or without device)? Independent  Stairs: Did the patient need assistance with internal or external stairs (with or without device)? Independent  Functional Cognition: Did the patient need help planning regular tasks such as shopping or remembering to take medications? Independent  Current  Functional Level Cognition  Arousal/Alertness: Awake/alert Overall Cognitive Status: Impaired/Different from baseline Orientation Level: Oriented X4 Safety/Judgement: Decreased awareness of  deficits General Comments: Per nursing night shift, pt was put to be independent in room; he is unsafe for mobiility without assistance. He is a high risk of falling due to decrease awareness of defecits since yesterday.  Attention: Sustained Sustained Attention: Appears intact Memory: Impaired Memory Impairment: Retrieval deficit, Decreased short term memory Decreased Short Term Memory: Verbal basic Awareness: Appears intact Problem Solving: Appears intact Safety/Judgment: Appears intact    Extremity Assessment (includes Sensation/Coordination)  Upper Extremity Assessment: Defer to OT evaluation  Lower Extremity Assessment: Overall WFL for tasks assessed    ADLs  Overall ADL's : Needs assistance/impaired Eating/Feeding: Independent Grooming: Wash/dry hands, Wash/dry face, Brushing hair, Minimal assistance, Standing Grooming Details (indicate cue type and reason): pt static standing able to complete task but requires UE support Upper Body Bathing: Min guard Lower Body Bathing: Minimal assistance, Sit to/from stand Lower Body Bathing Details (indicate cue type and reason): without UE support, mod A to access feet in standing without UE support which  Upper Body Dressing : Set up, Sitting Lower Body Dressing: Minimal assistance, Sit to/from stand Lower Body Dressing Details (indicate cue type and reason): without UE support  Toilet Transfer: Moderate assistance, Ambulation, Comfort height toilet, Grab bars, RW Toilet Transfer Details (indicate cue type and reason): ambulating without AD  Toileting- Clothing Manipulation and Hygiene: Minimal assistance, Sit to/from stand Toileting - Clothing Manipulation Details (indicate cue type and reason): without UE support  Tub/ Banker: Minimal assistance, Engineer, agricultural Details (indicate cue type and reason): Pt instructed in options for shower seats  Functional mobility during ADLs: Moderate assistance General ADL  Comments: Pt with LOB during ADLs without UE support     Mobility  Overal bed mobility: Independent General bed mobility comments: pt sitting on couch in room.     Transfers  Overall transfer level: Needs assistance Equipment used: Rolling walker (2 wheeled) Transfers: Sit to/from Stand, [employer] Transfers Sit to Stand: Min guard Stand pivot transfers: Min guard General transfer comment: required cuing for hand placement to assist with push off from low surface. Min guard for safety.     Ambulation / Gait / Stairs / Wheelchair Mobility  Ambulation/Gait Ambulation/Gait assistance: Min assist, Mod assist Ambulation Distance (Feet): 150 Feet Assistive device: Rolling walker (2 wheeled) (Bariatric RW) Gait Pattern/deviations: Step-through pattern, Decreased step length - right, Decreased step length - left, Scissoring, Drifts right/left General Gait Details: Pt required cuing for pacing throughout treatment and awareness of foot placement due to mild scissoring. Min A mostly with cuing but required mod A at times to correct 2 episodes of LOB and mild drifting in RW. Pt complained of "not feeling right" and "feeling off balance" during session. Multiple rest breaks initiated by pt due to R diplopia.  Gait velocity: very slow, numerous rest breaks. Stairs: Yes Stairs assistance: Min guard Stair Management: One rail Right, Alternating pattern, Step to pattern, Sideways, Forwards Number of Stairs: 10 General stair comments: unable to perform due to limited session time    Posture / Balance Dynamic Sitting Balance Sitting balance - Comments: pt able to perform dynamic sitting balance with no LOB noted.  Balance Overall balance assessment: Needs assistance Sitting-balance support: Feet supported Sitting balance-Leahy Scale: Good Sitting balance - Comments: pt able to perform dynamic sitting balance with no LOB noted.  Postural control: Left lateral lean Standing balance support:  No  upper extremity supported Standing balance-Leahy Scale: Fair Standing balance comment: Pt static standing with min A for balance and min-mod A with RW to maintain and reposition into upright posture.  High Level Balance Comments: eyes closed feet shoulder width x 30 sec no LOB, SLS on R > 3sec, on L with minguard for safaety 10 sec, able to pick up item from floor with S, turns 360 degrees to R and L slowly with S. Tandem gait with rail and min A    Special needs/care consideration BiPAP/CPAP No CPM No Continuous Drip IV No  Dialysis No      Life Vest No Oxygen No Special Bed No Trach Size No Wound Vac (area) No     Skin No                            Bowel mgmt: Patient reports no BM since admission, but documentation says BM 12/31/16 Bladder mgmt: Voiding in bathroom with assist and in urinal Diabetic mgmt Yes, on oral medications at home    Previous Home Environment Living Arrangements: Alone  Lives With: Alone Available Help at Discharge: Friend(s), Available PRN/intermittently Type of Home: Apartment Home Layout: One level Home Access: Stairs to enter Entrance Stairs-Rails: Right Entrance Stairs-Number of Steps: flight Bathroom Shower/Tub: Chiropodist: Standard Home Care Services: No Additional Comments: states mom can come from [place] to stay temporarily  Discharge Living Setting Plans for Discharge Living Setting: Alone, Other (Comment) (Lives alone.) Type of Home at Discharge: Other (Comment) (Condominium) Discharge Home Layout: One level (Condo on 2nd level) Discharge Home Access: Stairs to enter Entrance Stairs-Number of Steps: 12-14 steps Does the patient have any problems obtaining your medications?: No  Social/Family/Support Systems Patient Roles: Other (Comment) (Has 92 yo mom in [place].) Contact Information: Lewayne Bunting - mom - [phone] Anticipated Caregiver: self Ability/Limitations of Caregiver: Mom lives in  [place] and is disabled.  Has a few friends, but no caregiver support. Caregiver Availability: Intermittent Discharge Plan Discussed with Primary Caregiver: Yes Is Caregiver In Agreement with Plan?: Yes Does Caregiver/Family have Issues with Lodging/Transportation while Pt is in Rehab?: No  Goals/Additional Needs Patient/Family Goal for Rehab: PT/OT mod I goals Expected length of stay: 7-10 days Cultural Considerations: Christian Dietary Needs: Heart healthy, carb modified, thin liquids Equipment Needs: TBD Pt/Family Agrees to Admission and willing to participate: Yes Program Orientation Provided & Reviewed with Pt/Caregiver Including Roles  & Responsibilities: Yes  Decrease burden of Care through IP rehab admission: N/A  Possible need for SNF placement upon discharge: Not anticipated  Patient Condition: This patient's medical and functional status has changed since the consult dated: 01/01/17 in which the Rehabilitation Physician determined and documented that the patient's condition is appropriate for intensive rehabilitative care in an inpatient rehabilitation facility. See "History of Present Illness" (above) for medical update. Functional changes are:  Currently requiring min/mod assist to ambulate 250 feet RW. Patient's medical and functional status update has been discussed with the Rehabilitation physician and patient remains appropriate for inpatient rehabilitation. Will admit to inpatient rehab today.  Preadmission Screen Completed By:  Retta Diones, 01/02/2017 1:21 PM ______________________________________________________________________   Discussed status with Dr. Naaman Plummer on 01/02/17 at 18 and received telephone approval for admission today.  Admission Coordinator:  Retta Diones, time1321/Date02/09/18       Cosigned by:

## 2017-01-05 NOTE — Care Management Note (Signed)
Inpatient Rehabilitation Center Individual Statement of Services  Patient Name:  Nicholas Mejia  Date:  01/05/2017  Welcome to the Inpatient Rehabilitation Center.  Our goal is to provide you with an individualized program based on your diagnosis and situation, designed to meet your specific needs.  With this comprehensive rehabilitation program, you will be expected to participate in at least 3 hours of rehabilitation therapies Monday-Friday, with modified therapy programming on the weekends.  Your rehabilitation program will include the following services:  Physical Therapy (PT), Occupational Therapy (OT), Speech Therapy (ST), 24 hour per day rehabilitation nursing, Therapeutic Recreaction (TR), Neuropsychology, Case Management (Social Worker), Rehabilitation Medicine, Nutrition Services and Pharmacy Services  Weekly team conferences will be held on Wednesday to discuss your progress.  Your Social Worker will talk with you frequently to get your input and to update you on team discussions.  Team conferences with you and your family in attendance may also be held.  Expected length of stay: 12-14 days  Overall anticipated outcome: mod/i level  Depending on your progress and recovery, your program may change. Your Social Worker will coordinate services and will keep you informed of any changes. Your Social Worker's name and contact numbers are listed  below.  The following services may also be recommended but are not provided by the Inpatient Rehabilitation Center:   Driving Evaluations  Home Health Rehabiltiation Services  Outpatient Rehabilitation Services  Vocational Rehabilitation   Arrangements will be made to provide these services after discharge if needed.  Arrangements include referral to agencies that provide these services.  Your insurance has been verified to be:  BCBS of [place] Your primary doctor is:  Nicholas Mejia  Pertinent information will be shared with your doctor and your  insurance company.  Social Worker:  Dossie DerBecky Aamya Orellana, [phone] or [phone]) [phone]  Information discussed with and copy given to patient by: [name], Virl Coble G, 01/05/2017, 10:32 AM

## 2017-01-05 NOTE — Progress Notes (Signed)
Discussed possible need of lantus with Marissa NestlePam Love, PA. Pam Love informed me about discussion with pt on prior unit with pt not interested in lantus. Metformin increased and PO medication is best for pt due to compliance of medication use.

## 2017-01-05 NOTE — Progress Notes (Signed)
Occupational Therapy Session Note  Patient Details  Name: Nicholas Mejia MRN: [id number] Date of Birth: 08-May-1971  Today's Date: 01/05/2017 OT Individual Time: [id number] OT Individual Time Calculation (min): 40 min  and Today's Date: 01/05/2017 OT Missed Time: 20 Minutes Missed Time Reason: Patient ill (comment)   Short Term Goals: Week 1:  OT Short Term Goal 1 (Week 1): Pt will ambulate into bathroom for toilet transfer with Min A OT Short Term Goal 2 (Week 1): Pt will complete LB dressing with supervision  OT Short Term Goal 3 (Week 1): Pt will complete bathing with supervision and AE as needed OT Short Term Goal 4 (Week 1): Pt will ambulate in room to gather clothing items in prep for ADLs with Min A  Skilled Therapeutic Interventions/Progress Updates:    Treatment session with focus on activity tolerance and participation in treatment session.  Upon arrival, pt reporting fatigue from early AM nausea and vomiting and initially refusing therapy session.  Therapist returned and was able to encourage pt to participate in treatment session.  Ambulated to bathroom with min guard and use of doorways and counter tops for stability during ambulation.  Educated on safety concerns and fall risk with "furniture walking".  Pt completed bathing in room shower with setup assist and intermittent supervision.  Dressing completed at sit > stand level from EOB with setup assist.    Therapy Documentation Precautions:  Precautions Precautions: Fall Precaution Comments: monitor vitals, permissive HTN, DM, left LOBs Restrictions Weight Bearing Restrictions: No General: General OT Amount of Missed Time: 20 Minutes Vital Signs:  Pain: Pain Assessment Pain Score: 0-No pain  See Function Navigator for Current Functional Status.   Therapy/Group: Individual Therapy  Rosalio LoudHOXIE, Iasha Mccalister 01/05/2017, 10:30 AM

## 2017-01-05 NOTE — Progress Notes (Signed)
Erick Colace, MD Physician Signed Physical Medicine and Rehabilitation  Consult Note Date of Service: 01/01/2017 11:55 AM  Related encounter: ED to Hosp-Admission (Discharged) from 12/30/2016 in [facility] 5 [facility]     Expand All Collapse All   [] Hide copied text [] Hover for attribution information      Physical Medicine and Rehabilitation Consult   Reason for Consult: Gait disorder, ataxia and nausea with activity. Referring Physician: Dr. Pearlean Brownie   HPI: Nicholas Mejia is a 46 y.o. male with history of HTN, T2DM, tobacco use (quit 6 weeks ago) who was admitted to [facility] on 12/31/16 with left facial numbness, HA and unsteady gait. 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  2D echo with contrast revealed EF 55-60% and no wall abnormality or thrombus.  Dr. Pearlean Brownie recommended ASA and plavix for thrombotic stroke due to L-VA stenosis/large vessel disease. Therapy evaluations done and patient with balance deficits and poor safety. CIR recommended for follow up therapy.    Review of Systems  Constitutional: Positive for malaise/fatigue (has been sick for past 7 months/just getting over PNA).  HENT: Negative for hearing loss and tinnitus.   Eyes: Positive for blurred vision (decreased vision left eye due to trauma). Negative for double vision and pain.  Respiratory: Negative for cough and shortness of breath.   Cardiovascular: Negative for chest pain and palpitations.  Gastrointestinal: Positive for constipation, heartburn and nausea.  Genitourinary: Negative for frequency and urgency.  Musculoskeletal: Negative for joint pain, myalgias and neck pain.  Skin: Negative for itching and rash.  Neurological: Positive for sensory change (numbness and tingling in hands and  feet.  Now with left face numbness and right side without cold sensation. ) and headaches. Negative for dizziness.          Past Medical History:  Diagnosis Date  . Diabetes mellitus without complication [facility])          Past Surgical History:  Procedure Laterality Date  . EXTERNAL EAR SURGERY           Family History  Problem Relation Age of Onset  . Heart attack Father   . Gout Brother     Social History:  Lives alone--. Work as Teacher, early years/pre for [employer]. He reports that he has quit smoking 6 weeks ago.  He does not have any smokeless tobacco history on file. He reports that he drinks a glass of wine daily. He reports that he does not use drugs.        Allergies  Allergen Reactions  . Bee Venom Anaphylaxis and Swelling          Medications Prior to Admission  Medication Sig Dispense Refill  . aspirin EC 81 MG tablet Take 162 mg by mouth daily.    . chlorthalidone (HYGROTON) 25 MG tablet Take 25 mg by mouth daily.    . fluticasone (FLONASE) 50 MCG/ACT nasal spray Place 1 spray into both nostrils daily.    . fluticasone-salmeterol (ADVAIR HFA) 115-21 MCG/ACT inhaler Inhale 2 puffs into the lungs 2 (two) times daily as needed (shortness of breath).    [name] guaiFENesin-codeine 100-10 MG/5ML syrup Take 5 mLs by mouth 3 (three) times daily as needed for cough.    . metFORMIN (GLUCOPHAGE-XR) 500 MG 24 hr tablet Take 500 mg by mouth See  admin instructions. Take 1 tablet every morning, takes another tablet in the evening if remembers    . montelukast (SINGULAIR) 10 MG tablet Take 10 mg by mouth at bedtime.    . Turmeric 500 MG TABS Take 1,000 mg by mouth 2 (two) times daily.      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 Transfers Overall transfer level: Needs assistance Equipment used: None Transfers: Sit to/from Stand Sit to Stand: Supervision General transfer comment: supervision for safety due to pt's unsteadiness in standing. Pt able to stand with no assistance from AD or SPTA. Ambulation/Gait Ambulation/Gait assistance: Min assist, Mod assist Ambulation Distance (Feet): 250 Feet Assistive device: Rolling walker (2 wheeled), Quad cane Gait Pattern/deviations: Step-through pattern, Decreased step length - right, Decreased step length - left, Narrow base of support General Gait Details: initially began with straight cane but tansitioned back to RW for patient safety. Pt experienced three episodes of LOB with min-mod A to correct. Encouraged pt to progress slowly after getting up to avoid LOB. Pt required verbal cuing to widen BOS and to correct left lean. Pt aware of defecit but unable to correct.; expressed frustration of deficits at end of tx.  Stairs: Yes Stairs assistance: Min guard Stair Management: One rail Right, Alternating pattern, Step to pattern, Sideways, Forwards Number of Stairs: 10 General stair comments: unable to perform due to limited session time  ADL: ADL Overall ADL's : Needs assistance/impaired Eating/Feeding: Independent Grooming: Wash/dry hands, Wash/dry face, Oral care, Min guard, Standing Grooming Details (indicate cue type and reason): pt  static standing able to complete task but requires UE support Upper Body Bathing: Min guard Lower Body Bathing: Min guard Toilet Transfer: RW, Moderate assistance Functional mobility during ADLs: Moderate assistance, Rolling walker General ADL Comments: pt demonstrates scissoring and falling left with transfer. pt able to sit eOB and don shoes MOD I . pt able to static stand at sink in sara stedy min guard (A). pt however when transfering immediately falling L   Cognition: Cognition Overall Cognitive Status: Impaired/Different from baseline Orientation Level: Oriented X4 Cognition Arousal/Alertness: Awake/alert Behavior During Therapy: WFL for tasks assessed/performed Overall Cognitive Status: Impaired/Different from baseline Area of Impairment: Safety/judgement Safety/Judgement: Decreased awareness of deficits General Comments: Pt requests to wear tennis shoes to help prevent falling. Progressed awareness of deficits throughout tx.   Blood pressure (!) 153/81, pulse 83, temperature 98.4 F (36.9 C), temperature source Oral, resp. rate 18, height 6\' 2"  (1.88 m), weight (!) 147.9 kg (326 lb), SpO2 98 %. Physical Exam  Nursing note and vitals reviewed. Constitutional: He is oriented to person, place, and time. He appears well-developed and well-nourished.  Morbidly obese male sitting up in chair--NAD but reports mild nausea.   HENT:  Head: Normocephalic and atraumatic.  Eyes: Conjunctivae are normal. Pupils are equal, round, and reactive to light.  Neck: Normal range of motion. Neck supple.  Cardiovascular:  Normal rate and regular rhythm.   No murmur heard. Respiratory: Effort normal and breath sounds normal. No stridor. No respiratory distress.  GI: Soft. Bowel sounds are normal. He exhibits no distension. There is no tenderness.  Musculoskeletal: He exhibits no edema or tenderness.  Neurological: He is alert and oriented to person, place, and time.  Left facial numbness and  decreased sensation to cold on right. Speech clear. Able to follow commands without difficulty.   Skin: Skin is warm and dry. No rash noted. No erythema.  Psychiatric: He has a normal mood and affect. His behavior is normal. Thought content normal.  Mild dysmetria, left finger-nose-finger, no dysmetria on the right side. Motor strength is 5/5 bilateral deltoids by stress of grip, hip flexion, knee extension, ankle dorsal flexor. Standing balance, fair with wide base support, falls to left with normal base of support  Lab Results Last 24 Hours       Results for orders placed or performed during the hospital encounter of 12/30/16 (from the past 24 hour(s))  Glucose, capillary     Status: Abnormal   Collection Time: 12/31/16  4:40 PM  Result Value Ref Range   Glucose-Capillary 252 (H) 65 - 99 mg/dL  Glucose, capillary     Status: Abnormal   Collection Time: 12/31/16  9:25 PM  Result Value Ref Range   Glucose-Capillary 266 (H) 65 - 99 mg/dL   Comment 1 Notify RN    Comment 2 Document in Chart   Lipid panel     Status: Abnormal   Collection Time: 01/01/17  2:20 AM  Result Value Ref Range   Cholesterol 234 (H) 0 - 200 mg/dL   Triglycerides 161 (H) <150 mg/dL   HDL 33 (L) >09 mg/dL   Total CHOL/HDL Ratio 7.1 RATIO   VLDL 33 0 - 40 mg/dL   LDL Cholesterol 604 (H) 0 - 99 mg/dL  CBC     Status: Abnormal   Collection Time: 01/01/17  2:20 AM  Result Value Ref Range   WBC 12.8 (H) 4.0 - 10.5 K/uL   RBC 5.09 4.22 - 5.81 MIL/uL   Hemoglobin 14.9 13.0 - 17.0 g/dL   HCT 54.0 98.1 - 19.1 %   MCV 87.0 78.0 - 100.0 fL   MCH 29.3 26.0 - 34.0 pg   MCHC 33.6 30.0 - 36.0 g/dL   RDW 47.8 29.5 - 62.1 %   Platelets 261 150 - 400 K/uL  Comprehensive metabolic panel     Status: Abnormal   Collection Time: 01/01/17  2:20 AM  Result Value Ref Range   Sodium 131 (L) 135 - 145 mmol/L   Potassium 3.9 3.5 - 5.1 mmol/L   Chloride 92 (L) 101 - 111 mmol/L   CO2 30 22 - 32  mmol/L   Glucose, Bld 276 (H) 65 - 99 mg/dL   BUN 12 6 - 20 mg/dL   Creatinine, Ser 3.08 0.61 - 1.24 mg/dL   Calcium 8.4 (L) 8.9 - 10.3 mg/dL   Total Protein 7.2 6.5 - 8.1 g/dL   Albumin 3.1 (L) 3.5 - 5.0 g/dL   AST 15 15 - 41 U/L   ALT 15 (L) 17 - 63 U/L   Alkaline Phosphatase 54 38 - 126 U/L   Total Bilirubin 0.5 0.3 - 1.2 mg/dL   GFR calc non Af Amer >60 >60 mL/min   GFR calc Af Amer >60 >60 mL/min   Anion gap 9 5 - 15  Glucose, capillary  Status: Abnormal   Collection Time: 01/01/17  6:49 AM  Result Value Ref Range   Glucose-Capillary 237 (H) 65 - 99 mg/dL   Comment 1 Notify RN    Comment 2 Document in Chart   Glucose, capillary     Status: Abnormal   Collection Time: 01/01/17 11:36 AM  Result Value Ref Range   Glucose-Capillary 278 (H) 65 - 99 mg/dL   Comment 1 Notify RN    Comment 2 Document in Chart       Imaging Results (Last 48 hours)  Ct Angio Head W/cm &/or Wo Cm  Result Date: 12/31/2016 CLINICAL DATA:  Left facial numbness.  Left medulla infarction. EXAM: CT ANGIOGRAPHY HEAD AND NECK TECHNIQUE: Multidetector CT imaging of the head and neck was performed using the standard protocol during bolus administration of intravenous contrast. Multiplanar CT image reconstructions and MIPs were obtained to evaluate the vascular anatomy. Carotid stenosis measurements (when applicable) are obtained utilizing NASCET criteria, using the distal internal carotid diameter as the denominator. CONTRAST:  50 cc Isovue 370 COMPARISON:  MRI same day FINDINGS: CTA NECK FINDINGS Aortic arch: No atherosclerotic change, aneurysm or dissection. Right carotid system: Common carotid artery shows some circumferential wall thickening with minimal luminal diameter of 5 mm compared to an expected diameter of 8 mm. Carotid bifurcation shows atherosclerotic disease with a small region of calcified plaque and circumferential wall thickening. Minimal diameter of the ICA is 4 mm.  Compared to a more distal cervical ICA diameter of 4.5 mm, this indicates a 15-20% stenosis. Cervical ICA beyond that is widely patent. Left carotid system: Common carotid artery shows a normal caliber throughout. Mild atherosclerotic change at the carotid bifurcation with a punctate calcification and mild wall thickening. No narrowing of the ICA relative to the more distal cervical ICA. Vertebral arteries: Right vertebral artery origin shows calcified plaque with stenosis estimated at 70% or greater. Beyond that, the right vertebral artery is widely patent through the cervical region. Left vertebral artery origin shows 50% stenosis. Beyond that, the vessel is widely patent through the cervical region. Skeleton: Ordinary mild lower cervical spondylosis. Other neck: No mass or lymphadenopathy. Upper chest: Normal Review of the MIP images confirms the above findings CTA HEAD FINDINGS Anterior circulation: Both internal carotid arteries are patent through the siphon regions. There is atherosclerosis throughout the siphon regions with extensive calcification. Stenosis at the proximal siphon on the right shows a diameter of 2 mm diameter in the mid siphon on the left is also 2 mm. Anterior and middle cerebral vessels are patent bilaterally with atherosclerotic irregularity. 50% stenoses at both M1 origins. Posterior circulation: Both vertebral arteries are patent at the foramen magnum. Both posterior inferior cerebellar arteries show flow. There is severe stenosis of the left vertebral artery beyond PICA, but the vessel does show patency to the basilar. There is moderate stenosis of the distal right vertebral artery just proximal to the basilar. The basilar artery shows mild atherosclerotic irregularity but no focal stenosis. Superior cerebellar and posterior cerebral arteries are patent. Both anterior and posterior circulation distal branch vessels show atherosclerotic irregularity. Venous sinuses: Patent and normal  Anatomic variants: None significant Delayed phase: No abnormal enhancement. Review of the MIP images confirms the above findings IMPRESSION: In this patient with an acute right medulla infarction, there is calcified plaque at the right vertebral artery origin with 70% stenosis. There is noncalcified plaque at the left vertebral artery origin with 50% stenosis. There is atherosclerotic disease affecting the proximal intracranial  vertebral arteries with severe stenosis on the left beyond PICA, 70% or greater. There is stenosis of the distal right vertebral artery just proximal to the basilar estimated at 50%. Atherosclerotic change affecting both common carotid arteries and carotid bifurcations but without flow limiting stenosis. 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 Diameter as narrow as 2 mm on each side. 50% stenoses at both M1 origins. Distal intra cranial vessel atherosclerotic change diffusely. Electronically Signed   By: Paulina Fusi M.D.   On: 12/31/2016 08:20   Dg Chest 2 View  Result Date: 12/31/2016 CLINICAL DATA:  Fever EXAM: CHEST  2 VIEW COMPARISON:  None. FINDINGS: Heart is borderline in size. Lingular atelectasis or scarring. Right lung is clear. No effusions or acute bony abnormality. IMPRESSION: Lingular atelectasis or scarring. Electronically Signed   By: Charlett Nose M.D.   On: 12/31/2016 13:48   Ct Head Wo Contrast  Result Date: 12/30/2016 CLINICAL DATA:  Left-sided numbness starting at 11 a.m. History of hypertension. EXAM: CT HEAD WITHOUT CONTRAST TECHNIQUE: Contiguous axial images were obtained from the base of the skull through the vertex without intravenous contrast. COMPARISON:  None. FINDINGS: Brain: No evidence of acute infarction, hemorrhage, hydrocephalus, extra-axial collection or mass lesion/mass effect. Vascular: Vascular calcifications are present. Skull: Normal. Negative for fracture or focal lesion. Sinuses/Orbits: No acute finding. Other: None.  IMPRESSION: No acute intracranial abnormalities. Electronically Signed   By: Burman Nieves M.D.   On: 12/30/2016 22:25   Ct Angio Neck W/cm &/or Wo/cm  Result Date: 12/31/2016 CLINICAL DATA:  Left facial numbness.  Left medulla infarction. EXAM: CT ANGIOGRAPHY HEAD AND NECK TECHNIQUE: Multidetector CT imaging of the head and neck was performed using the standard protocol during bolus administration of intravenous contrast. Multiplanar CT image reconstructions and MIPs were obtained to evaluate the vascular anatomy. Carotid stenosis measurements (when applicable) are obtained utilizing NASCET criteria, using the distal internal carotid diameter as the denominator. CONTRAST:  50 cc Isovue 370 COMPARISON:  MRI same day FINDINGS: CTA NECK FINDINGS Aortic arch: No atherosclerotic change, aneurysm or dissection. Right carotid system: Common carotid artery shows some circumferential wall thickening with minimal luminal diameter of 5 mm compared to an expected diameter of 8 mm. Carotid bifurcation shows atherosclerotic disease with a small region of calcified plaque and circumferential wall thickening. Minimal diameter of the ICA is 4 mm. Compared to a more distal cervical ICA diameter of 4.5 mm, this indicates a 15-20% stenosis. Cervical ICA beyond that is widely patent. Left carotid system: Common carotid artery shows a normal caliber throughout. Mild atherosclerotic change at the carotid bifurcation with a punctate calcification and mild wall thickening. No narrowing of the ICA relative to the more distal cervical ICA. Vertebral arteries: Right vertebral artery origin shows calcified plaque with stenosis estimated at 70% or greater. Beyond that, the right vertebral artery is widely patent through the cervical region. Left vertebral artery origin shows 50% stenosis. Beyond that, the vessel is widely patent through the cervical region. Skeleton: Ordinary mild lower cervical spondylosis. Other neck: No mass or  lymphadenopathy. Upper chest: Normal Review of the MIP images confirms the above findings CTA HEAD FINDINGS Anterior circulation: Both internal carotid arteries are patent through the siphon regions. There is atherosclerosis throughout the siphon regions with extensive calcification. Stenosis at the proximal siphon on the right shows a diameter of 2 mm diameter in the mid siphon on the left is also 2 mm. Anterior and middle cerebral vessels are patent bilaterally with atherosclerotic  irregularity. 50% stenoses at both M1 origins. Posterior circulation: Both vertebral arteries are patent at the foramen magnum. Both posterior inferior cerebellar arteries show flow. There is severe stenosis of the left vertebral artery beyond PICA, but the vessel does show patency to the basilar. There is moderate stenosis of the distal right vertebral artery just proximal to the basilar. The basilar artery shows mild atherosclerotic irregularity but no focal stenosis. Superior cerebellar and posterior cerebral arteries are patent. Both anterior and posterior circulation distal branch vessels show atherosclerotic irregularity. Venous sinuses: Patent and normal Anatomic variants: None significant Delayed phase: No abnormal enhancement. Review of the MIP images confirms the above findings IMPRESSION: In this patient with an acute right medulla infarction, there is calcified plaque at the right vertebral artery origin with 70% stenosis. There is noncalcified plaque at the left vertebral artery origin with 50% stenosis. There is atherosclerotic disease affecting the proximal intracranial vertebral arteries with severe stenosis on the left beyond PICA, 70% or greater. There is stenosis of the distal right vertebral artery just proximal to the basilar estimated at 50%. Atherosclerotic change affecting both common carotid arteries and carotid bifurcations but without flow limiting stenosis. Severe atherosclerotic disease in the carotid siphon  regions. Diameter as narrow as 2 mm on each side. 50% stenoses at both M1 origins. Distal intra cranial vessel atherosclerotic change diffusely. Electronically Signed   By: Paulina Fusi M.D.   On: 12/31/2016 08:20   Mr Brain Wo Contrast  Result Date: 12/31/2016 CLINICAL DATA:  46 y/o M; left-sided facial numbness, headache, pressure behind the left eye, and weakness in the legs. EXAM: MRI HEAD WITHOUT CONTRAST TECHNIQUE: Multiplanar, multiecho pulse sequences of the brain and surrounding structures were obtained without intravenous contrast. COMPARISON:  12/30/2016 CT head. FINDINGS: Brain: Subcentimeter focus of diffusion restriction within the left posterolateral medulla. Scattered foci of T2 FLAIR hyperintensity in subcortical and periventricular white matter are nonspecific. Mild diffuse brain parenchymal volume loss. No abnormal susceptibility hypointensity to indicate intracranial hemorrhage. No focal mass effect, hydrocephalus, or extra-axial collection. Vascular: Normal flow voids. Skull and upper cervical spine: Normal marrow signal. Sinuses/Orbits: Negative.  Underpneumatized frontal sinuses. Other: None. IMPRESSION: 1. Subcentimeter acute/early subacute infarction within the left posterolateral medulla. 2. Nonspecific T2 FLAIR hyperintense white matter foci unexpected for age probably represent microvascular ischemic changes particularly in the setting of diabetes, less likely migraine headache or sequelae of demyelination, vasculitis, and other infectious/inflammatory processes. These results were called by telephone at the time of interpretation on 12/31/2016 at 12:52 am to Dr. Ross Marcus , who verbally acknowledged these results. Electronically Signed   By: Mitzi Hansen M.D.   On: 12/31/2016 00:53     Assessment/Plan: Diagnosis: Wallenberg syndrome with left hemiataxia, right hemisensory deficits, left facial dysesthesias, gait disorder 1. Does the need for close, 24 hr/day  medical supervision in concert with the patient's rehab needs make it unreasonable for this patient to be served in a less intensive setting? Yes 2. Co-Morbidities requiring supervision/potential complications: Diabetes, uncontrolled, hypertension 3. Due to bladder management, bowel management, safety, skin/wound care, disease management, medication administration, pain management and patient education, does the patient require 24 hr/day rehab nursing? Yes 4. Does the patient require coordinated care of a physician, rehab nurse, PT (1-2 hrs/day, 5 days/week) and OT (1-2 hrs/day, 5 days/week) to address physical and functional deficits in the context of the above medical diagnosis(es)? Yes Addressing deficits in the following areas: balance, endurance, locomotion, strength, transferring, bowel/bladder control, bathing, dressing, feeding, grooming,  toileting, cognition, speech, language, swallowing and psychosocial support 5. Can the patient actively participate in an intensive therapy program of at least 3 hrs of therapy per day at least 5 days per week? Yes 6. The potential for patient to make measurable gains while on inpatient rehab is excellent 7. Anticipated functional outcomes upon discharge from inpatient rehab are modified independent  with PT, modified independent with OT, n/a with SLP. 8. Estimated rehab length of stay to reach the above functional goals is: 7-10d 9. Does the patient have adequate social supports and living environment to accommodate these discharge functional goals? Yes 10. Anticipated D/C setting: Home 11. Anticipated post D/C treatments: HH therapy 12. Overall Rehab/Functional Prognosis: excellent  RECOMMENDATIONS: This patient's condition is appropriate for continued rehabilitative care in the following setting: CIR Patient has agreed to participate in recommended program. Yes Note that insurance prior authorization may be required for reimbursement for recommended  care.  Comment: Still having CVA related problems with  nausea and vomiting, this can be addressed on the rehabilitation unit   Jacquelynn Cree, Cordelia Poche 01/01/2017

## 2017-01-06 ENCOUNTER — Inpatient Hospital Stay (HOSPITAL_COMMUNITY): Payer: 59 | Admitting: Physical Therapy

## 2017-01-06 ENCOUNTER — Inpatient Hospital Stay (HOSPITAL_COMMUNITY): Payer: 59 | Admitting: Occupational Therapy

## 2017-01-06 ENCOUNTER — Inpatient Hospital Stay (HOSPITAL_COMMUNITY): Payer: BLUE CROSS/BLUE SHIELD

## 2017-01-06 ENCOUNTER — Encounter (HOSPITAL_COMMUNITY): Payer: 59 | Admitting: Psychology

## 2017-01-06 DIAGNOSIS — E1165 Type 2 diabetes mellitus with hyperglycemia: Secondary | ICD-10-CM | POA: Insufficient documentation

## 2017-01-06 DIAGNOSIS — E118 Type 2 diabetes mellitus with unspecified complications: Secondary | ICD-10-CM

## 2017-01-06 DIAGNOSIS — IMO0002 Reserved for concepts with insufficient information to code with codable children: Secondary | ICD-10-CM | POA: Insufficient documentation

## 2017-01-06 DIAGNOSIS — K219 Gastro-esophageal reflux disease without esophagitis: Secondary | ICD-10-CM | POA: Insufficient documentation

## 2017-01-06 LAB — BASIC METABOLIC PANEL
ANION GAP: 11 (ref 5–15)
BUN: 12 mg/dL (ref 6–20)
CO2: 29 mmol/L (ref 22–32)
Calcium: 8.8 mg/dL — ABNORMAL LOW (ref 8.9–10.3)
Chloride: 94 mmol/L — ABNORMAL LOW (ref 101–111)
Creatinine, Ser: 0.99 mg/dL (ref 0.61–1.24)
GFR calc non Af Amer: >60 mL/min (ref >60)
GLUCOSE: 184 mg/dL — AB (ref 65–99)
POTASSIUM: 4 mmol/L (ref 3.5–5.1)
Sodium: 134 mmol/L — ABNORMAL LOW (ref 135–145)

## 2017-01-06 LAB — CBC WITH DIFFERENTIAL/PLATELET
BASOS ABS: 0 10*3/uL (ref 0.0–0.1)
Basophils Relative: 0 %
Eosinophils Absolute: 0.1 10*3/uL (ref 0.0–0.7)
Eosinophils Relative: 1 %
HEMATOCRIT: 44.7 % (ref 39.0–52.0)
Hemoglobin: 14.9 g/dL (ref 13.0–17.0)
LYMPHS PCT: 22 %
Lymphs Abs: 3.5 10*3/uL (ref 0.7–4.0)
MCH: 29.4 pg (ref 26.0–34.0)
MCHC: 33.3 g/dL (ref 30.0–36.0)
MCV: 88.3 fL (ref 78.0–100.0)
MONO ABS: 1 10*3/uL (ref 0.1–1.0)
Monocytes Relative: 6 %
NEUTROS ABS: 11.4 10*3/uL — AB (ref 1.7–7.7)
NEUTROS PCT: 71 %
Platelets: 263 10*3/uL (ref 150–400)
RBC: 5.06 MIL/uL (ref 4.22–5.81)
RDW: 13.2 % (ref 11.5–15.5)
WBC: 16 10*3/uL — ABNORMAL HIGH (ref 4.0–10.5)

## 2017-01-06 LAB — URINALYSIS, ROUTINE W REFLEX MICROSCOPIC
BACTERIA UA: NONE SEEN
Bilirubin Urine: NEGATIVE
Glucose, UA: 150 mg/dL — AB
KETONES UR: NEGATIVE mg/dL
LEUKOCYTES UA: NEGATIVE
Nitrite: NEGATIVE
PH: 6 (ref 5.0–8.0)
Protein, ur: 100 mg/dL — AB
SQUAMOUS EPITHELIAL / LPF: NONE SEEN
Specific Gravity, Urine: 1.014 (ref 1.005–1.030)

## 2017-01-06 LAB — GLUCOSE, CAPILLARY
GLUCOSE-CAPILLARY: 153 mg/dL — AB (ref 65–99)
GLUCOSE-CAPILLARY: 139 mg/dL — AB (ref 65–99)
Glucose-Capillary: 159 mg/dL — ABNORMAL HIGH (ref 65–99)
Glucose-Capillary: 218 mg/dL — ABNORMAL HIGH (ref 65–99)

## 2017-01-06 MED ORDER — ONDANSETRON HCL 4 MG PO TABS
8.0000 mg | ORAL_TABLET | Freq: Three times a day (TID) | ORAL | Status: DC
  Administered 2017-01-06 (×3): 8 mg via ORAL
  Filled 2017-01-06 (×4): qty 2

## 2017-01-06 MED ORDER — INSULIN GLARGINE 100 UNIT/ML ~~LOC~~ SOLN
10.0000 [IU] | Freq: Every day | SUBCUTANEOUS | Status: DC
  Administered 2017-01-06: 10 [IU] via SUBCUTANEOUS
  Filled 2017-01-06 (×3): qty 0.1

## 2017-01-06 MED ORDER — FAMOTIDINE 20 MG PO TABS
20.0000 mg | ORAL_TABLET | Freq: Every day | ORAL | Status: DC
  Administered 2017-01-06: 20 mg via ORAL
  Filled 2017-01-06: qty 1

## 2017-01-06 MED ORDER — LISINOPRIL 5 MG PO TABS
5.0000 mg | ORAL_TABLET | Freq: Once | ORAL | Status: AC
  Administered 2017-01-06: 5 mg via ORAL
  Filled 2017-01-06: qty 1

## 2017-01-06 MED ORDER — GLIMEPIRIDE 1 MG PO TABS: 1.0000 mg | ORAL_TABLET | Freq: Every day | ORAL | Status: DC

## 2017-01-06 MED ORDER — SCOPOLAMINE 1 MG/3DAYS TD PT72
1.0000 | MEDICATED_PATCH | TRANSDERMAL | Status: DC
  Administered 2017-01-06 – 2017-01-09 (×2): 1.5 mg via TRANSDERMAL
  Filled 2017-01-06 (×3): qty 1

## 2017-01-06 MED ORDER — ONDANSETRON HCL 4 MG PO TABS: 8.0000 mg | ORAL_TABLET | Freq: Four times a day (QID) | ORAL | Status: DC | PRN

## 2017-01-06 MED ORDER — LISINOPRIL 10 MG PO TABS: 10.0000 mg | ORAL_TABLET | Freq: Every day | ORAL | Status: DC

## 2017-01-06 NOTE — Progress Notes (Signed)
Occupational Therapy Session Note  Patient Details  Name: Nicholas Mejia MRN: [id number] Date of Birth: 10-18-1971  Today's Date: 01/06/2017 OT Individual Time: [id number] and [id number] OT Individual Time Calculation (min): 47 min and 10 min  and Today's Date: 01/06/2017 OT Missed Time: 13 Minutes and 35 min Missed Time Reason: Patient ill (comment)   Short Term Goals: Week 1:  OT Short Term Goal 1 (Week 1): Pt will ambulate into bathroom for toilet transfer with Min A OT Short Term Goal 2 (Week 1): Pt will complete LB dressing with supervision  OT Short Term Goal 3 (Week 1): Pt will complete bathing with supervision and AE as needed OT Short Term Goal 4 (Week 1): Pt will ambulate in room to gather clothing items in prep for ADLs with Min A  Skilled Therapeutic Interventions/Progress Updates:    1) Treatment session with focus on functional mobility, dynamic standing balance, and management of vestibular symptoms during functional tasks.  Pt received seated EOB finishing breakfast.  Pt reporting nausea and vomiting last night and plan to eat "soft solids" to decrease irritation of stomach.  Discussed potential swallow vs digestion issues, recommending pt speak with MD.  Pt deferred bathing and dressing.  Ambulated to ADL apt with close supervision with pt veering to Rt initially but able to correct.  Engaged in tub/shower transfers with stepping over tub ledge with supervision.  Pt utilized grab bar on far wall for steady assist.  Educated on use of grab bar for increased safety with discussion regarding suction cup vs installed grab bars.  Recommend shower seat for energy conservation as well as increased safety with pt reporting understanding.  Engaged in mobility carrying 10# box to simulate work load at work with pt able to transport without issues, even picking up from low surface.  Engaged in trunk rotation with visual scanning to challenge vestibular symptoms, pt reporting increased nausea and  requesting prolonged seated rest break and eventual return to room due to increased nausea and dizziness.  2) Attempted to engage pt in functional mobility or activity to increase balance, activity tolerance, and management of vestibular symptoms.  Pt in bed upon arrival reporting ongoing nausea.  Reports plan to have nausea patch applied at noon and requesting to defer therapy until receiving patch.  Educated on purpose of therapies and even attempts to get OOB, with pt continuing to decline.  Will continue to follow as scheduled and symptoms resolve.  Therapy Documentation Precautions:  Precautions Precautions: Fall Precaution Comments: monitor vitals, permissive HTN, DM, left LOBs Restrictions Weight Bearing Restrictions: No General: General OT Amount of Missed Time: 48 Minutes Vital Signs: Therapy Vitals Temp: 98.1 F (36.7 C) Temp Source: Oral Pulse Rate: 95 Resp: 18 BP: (!) 154/83 Patient Position (if appropriate): Lying Oxygen Therapy SpO2: 96 % O2 Device: Not Delivered Pain: Pain Assessment Pain Assessment: No/denies pain  See Function Navigator for Current Functional Status.   Therapy/Group: Individual Therapy  Rosalio LoudHOXIE, Venecia Mehl 01/06/2017, 8:20 AM

## 2017-01-06 NOTE — Progress Notes (Signed)
Physical Therapy Session Note  Patient Details  Name: Nicholas Mejia MRN: [id number] Date of Birth: May 06, 1971  Today's Date: 01/06/2017 PT Individual Time: 1310-1400 PT Individual Time Calculation (min): 50 min   Short Term Goals: Week 1:  PT Short Term Goal 1 (Week 1): Pt will perform bed mobility and bed <> chair transfers supervision PT Short Term Goal 2 (Week 1): Pt will ambulate x 150' with min A and reporting 50% less dizziness/nausea PT Short Term Goal 3 (Week 1): Pt will negotiate 12 stairs with R rail and min A PT Short Term Goal 4 (Week 1): Pt will perform vestibular and visual habituation exercises with supervision  Skilled Therapeutic Interventions/Progress Updates:    no c/o pain but reporting ongoing nausea from vestibular issues.  Session focus on balance, high level gait, gaze stabilization exercises, and pt education.    Pt ambulates to and from therapy gym with supervision and min cues for forward gaze.  High level gait training weaving through cones x3 and kicking yoga block through cones x2 with supervision and min cues.  Seated hip flexion 2x10 with 3 second hold focus on LE control and strengthening.  Sit<>stand 2x5 without UEs focus on strengthening, activity tolerance, forward weight shift, and focusing gaze on target to reduce symptoms of nausea/dizziness.  Pt returned to room and completed vestibular HEP with min cues for form.  Pt left seated EOB with call blel in reach and needs met.   Therapy Documentation Precautions:  Precautions Precautions: Fall Precaution Comments: monitor vitals, permissive HTN, DM, left LOBs Restrictions Weight Bearing Restrictions: No   See Function Navigator for Current Functional Status.   Therapy/Group: Individual Therapy  Comfort Iversen E Penven-Crew 01/06/2017, 2:05 PM

## 2017-01-06 NOTE — Plan of Care (Signed)
Problem: RH PAIN MANAGEMENT Goal: RH STG PAIN MANAGED AT OR BELOW PT'S PAIN GOAL >3  Outcome: Progressing No pain at this time.

## 2017-01-06 NOTE — Progress Notes (Signed)
Physical Therapy Session Note  Patient Details  Name: Nicholas Mejia MRN: [id number] Date of Birth: [birth year]  Today's Date: 01/06/2017 PT Individual Time: 0990-6893 PT Individual Time Calculation (min): 30 min   Short Term Goals: Week 1:  PT Short Term Goal 1 (Week 1): Pt will perform bed mobility and bed <> chair transfers supervision PT Short Term Goal 2 (Week 1): Pt will ambulate x 150' with min A and reporting 50% less dizziness/nausea PT Short Term Goal 3 (Week 1): Pt will negotiate 12 stairs with R rail and min A PT Short Term Goal 4 (Week 1): Pt will perform vestibular and visual habituation exercises with supervision  Skilled Therapeutic Interventions/Progress Updates:    no c/o pain.  Session focus on activity tolerance, transfers, gait, NMR, and pt education.   PT arrived and pt had transferred himself from bed>standard height arm chair without supervision from staff.  Reoriented pt to safety plan and educated on need to call for supervision prior to transferring and pt verbalized understanding.  Also provided education on having lights on in the room during the day to decrease symptoms of nausea/dizziness per PA recommendations.    Gait training to and from therapy gym with supervision, cues for forward gaze.  Tall kneeling, bias to LLE, during table top activity for LLE NMR, balance, and activity tolerance.  Nustep x10 minutes (5 with BLEs, 5 with 4 extremities) focus on cardiovascular endurance and reciprocal stepping pattern.  Pt returned to room at end of session and positioned in bed with call bell in reach and needs met.   Therapy Documentation Precautions:  Precautions Precautions: Fall Precaution Comments: monitor vitals, permissive HTN, DM, left LOBs Restrictions Weight Bearing Restrictions: No   See Function Navigator for Current Functional Status.   Therapy/Group: Individual Therapy  Earnest Conroy Penven-Crew 01/06/2017, 4:49 PM

## 2017-01-06 NOTE — Progress Notes (Addendum)
[facility] PHYSICAL MEDICINE & REHABILITATION     PROGRESS NOTE  Subjective/Complaints:  Pt seen working with therapies this AM.  He had trouble sleep with reflux vs. Dysphagia.  He still has nausea and vomiting, mainly at night.    ROS: +Nausea, vomiting, reflux. Denies CP, SOB, Diarrhea.  Objective: Vital Signs: Blood pressure (!) 154/83, pulse 95, temperature 98.1 F (36.7 C), temperature source Oral, resp. rate 18, height 6\' 2"  (1.88 m), weight (!) 147.9 kg (326 lb 1 oz), SpO2 96 %. No results found.  Recent Labs  01/06/17 0520  WBC 16.0*  HGB 14.9  HCT 44.7  PLT 263    Recent Labs  01/06/17 0520  NA 134*  K 4.0  CL 94*  GLUCOSE 184*  BUN 12  CREATININE 0.99  CALCIUM 8.8*   CBG (last 3)   Recent Labs  01/05/17 1654 01/05/17 2057 01/06/17 0640  GLUCAP 206* 198* 159*    Wt Readings from Last 3 Encounters:  01/02/17 (!) 147.9 kg (326 lb 1 oz)  12/31/16 (!) 147.9 kg (326 lb)    Physical Exam:  BP (!) 154/83 (BP Location: Left Arm)   Pulse 95   Temp 98.1 F (36.7 C) (Oral)   Resp 18   Ht 6\' 2"  (1.88 m)   Wt (!) 147.9 kg (326 lb 1 oz)   SpO2 96%   BMI 41.86 kg/m  Constitutional: He appears well-developed. Obese. HENT: Normocephalicand atraumatic.  Eyes: EOMI. No discharge.  Cardiovascular: RRR. No JVD. Respiratory: Effort normal and breath sounds normal. No stridor.  GI: Soft. Bowel sounds are normal.  Musculoskeletal: No edema. No tenderness.  Neurological: He is alertand oriented. Motor: 5/5 throughout. No ataxia Skin: Skin is warmand dry.  Psychiatric: He has a normal mood and affect. His behavior is normal. Thought contentnormal   Assessment/Plan: 1. 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 Physiatrist is providing close team supervision and 24 hour management of active medical problems listed below. Physiatrist and rehab  team continue to assess barriers to discharge/monitor patient progress toward functional and medical goals.  Function:  Bathing Bathing position   Position: Shower  Bathing parts Body parts bathed by patient: Right arm, Left arm, Chest, Abdomen, Front perineal area, Buttocks, Right upper leg, Left upper leg Body parts bathed by helper: Right lower leg, Left lower leg, Back  Bathing assist        Upper Body Dressing/Undressing Upper body dressing   What is the patient wearing?: Pull over shirt/dress     Pull over shirt/dress - Perfomed by patient: Thread/unthread right sleeve, Thread/unthread left sleeve, Put head through opening, Pull shirt over trunk          Upper body assist Assist Level: Set up      Lower Body Dressing/Undressing Lower body dressing   What is the patient wearing?: Underwear, Pants, Non-skid slipper socks Underwear - Performed by patient: Thread/unthread right underwear leg, Thread/unthread left underwear leg, Pull underwear up/down   Pants- Performed by patient: Thread/unthread right pants leg, Pull pants up/down, Thread/unthread left pants leg   Non-skid slipper socks- Performed by patient: Don/doff right sock, Don/doff left sock Non-skid slipper socks- Performed by helper: Don/doff left sock     Shoes - Performed by patient: Don/doff right shoe, Fasten right Shoes - Performed by helper: Don/doff left shoe, Fasten left          Lower body assist Assist  for lower body dressing: Set up   Set up : To obtain clothing/put away  Toileting Toileting   Toileting steps completed by patient: Adjust clothing prior to toileting, Performs perineal hygiene, Adjust clothing after toileting   Toileting Assistive Devices: Grab bar or rail  Toileting assist Assist level: More than reasonable time   Transfers Chair/bed transfer   Chair/bed transfer method: Ambulatory Chair/bed transfer assist level: Supervision or verbal cues Chair/bed transfer assistive  device: Armrests     Locomotion Ambulation     Max distance: 150 Assist level: Supervision or verbal cues   Wheelchair   Type: Manual Max wheelchair distance: 150 Assist Level: Supervision or verbal cues  Cognition Comprehension Comprehension assist level: Follows complex conversation/direction with no assist  Expression Expression assist level: Expresses complex ideas: With no assist  Social Interaction Social Interaction assist level: Interacts appropriately with others - No medications needed.  Problem Solving Problem solving assist level: Solves complex problems: Recognizes & self-corrects  Memory Memory assist level: More than reasonable amount of time    Medical Problem List and Plan: 1. 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   Cont CIR 2. DVT Prophylaxis/Anticoagulation: Pharmaceutical: Lovenox 3. Pain Management: N/A 4. Mood: LCSW to follow for evaluation and support.  5. Neuropsych: This patient iscapable of making decisions on hisown behalf. 6. Skin/Wound Care: Routine pressure relief measures.  7. Fluids/Electrolytes/Nutrition: Monitor I/O.  8. HTN: Monitor BP bid  Lisinopril 5 started 2/12, increased on 2/13  Will add medications as indicated if remains persistently high.  9. T2DM: Hgb A1C- 9.5. He reports that he has not been fully compliant with meds due to malaise for months. Monitor BS ac/hs.   Metformin increased to 1000 BID on 2/11  Amaryl 1mg  started 2/13  Use SSI for elevated BS.   Monitor with increased mobility 10. Dyslipidemia: Now on Lipitor 11. Hyponatremia:   Na+ 134 on 2/13 12. Leucocytosis: Reports recent bout of PNA. No respiratory symptoms reported.   WBCs 16.0 on 2/13  Cont to monitor  Ua/Ucx ordered  CXR 2/7 reviewed, no infectious process, repeat ordered due to recent PNA and chest symptoms 13 CKD?, more likely AKI: SCr-1.34 at admission.   Cr. 0.99 on 2/13  Cont to monitor 14. Nausea/Dizziness: Ordered  vestibular evaluation.  Continue prn zofran for nausea.   Increased dose on 2/13 15. Morbid obesity: Educated appropriate diet and on importance of weight loss to help promote overall health and function.  16. ?GERD  Pepcid started 2/13  LOS (Days) 4 A FACE TO FACE EVALUATION WAS PERFORMED  Raymie Trani Karis Juba 01/06/2017 8:43 AM

## 2017-01-07 ENCOUNTER — Inpatient Hospital Stay (HOSPITAL_COMMUNITY): Payer: 59 | Admitting: Physical Therapy

## 2017-01-07 ENCOUNTER — Inpatient Hospital Stay (HOSPITAL_COMMUNITY): Payer: 59 | Admitting: *Deleted

## 2017-01-07 ENCOUNTER — Inpatient Hospital Stay (HOSPITAL_COMMUNITY): Payer: BLUE CROSS/BLUE SHIELD | Admitting: Occupational Therapy

## 2017-01-07 ENCOUNTER — Inpatient Hospital Stay (HOSPITAL_COMMUNITY): Payer: 59 | Admitting: Occupational Therapy

## 2017-01-07 ENCOUNTER — Inpatient Hospital Stay (HOSPITAL_COMMUNITY): Payer: BLUE CROSS/BLUE SHIELD

## 2017-01-07 DIAGNOSIS — G43A1 Cyclical vomiting, intractable: Secondary | ICD-10-CM

## 2017-01-07 LAB — URINE CULTURE: Culture: <10000 — AB

## 2017-01-07 LAB — GLUCOSE, CAPILLARY
GLUCOSE-CAPILLARY: 167 mg/dL — AB (ref 65–99)
GLUCOSE-CAPILLARY: 183 mg/dL — AB (ref 65–99)
Glucose-Capillary: 234 mg/dL — ABNORMAL HIGH (ref 65–99)
Glucose-Capillary: 214 mg/dL — ABNORMAL HIGH (ref 65–99)
Glucose-Capillary: 254 mg/dL — ABNORMAL HIGH (ref 65–99)
Glucose-Capillary: 234 mg/dL — ABNORMAL HIGH (ref 65–99)

## 2017-01-07 MED ORDER — SODIUM CHLORIDE 0.9 % IV SOLN
INTRAVENOUS | Status: DC
  Administered 2017-01-07 (×2): via INTRAVENOUS

## 2017-01-07 MED ORDER — PANTOPRAZOLE SODIUM 40 MG PO TBEC
40.0000 mg | DELAYED_RELEASE_TABLET | Freq: Two times a day (BID) | ORAL | Status: DC
  Administered 2017-01-07: 40 mg via ORAL
  Filled 2017-01-07: qty 1

## 2017-01-07 MED ORDER — AMLODIPINE BESYLATE 2.5 MG PO TABS
2.5000 mg | ORAL_TABLET | Freq: Every day | ORAL | Status: DC
  Filled 2017-01-07: qty 1

## 2017-01-07 MED ORDER — NITROGLYCERIN 2 % TD OINT
0.5000 [in_us] | TOPICAL_OINTMENT | Freq: Four times a day (QID) | TRANSDERMAL | Status: DC | PRN
  Filled 2017-01-07: qty 30

## 2017-01-07 MED ORDER — ONDANSETRON HCL 4 MG PO TABS
4.0000 mg | ORAL_TABLET | Freq: Three times a day (TID) | ORAL | Status: DC
  Administered 2017-01-07 – 2017-01-10 (×13): 4 mg via ORAL
  Filled 2017-01-07 (×13): qty 1

## 2017-01-07 MED ORDER — ONDANSETRON HCL 4 MG/2ML IJ SOLN
4.0000 mg | Freq: Four times a day (QID) | INTRAMUSCULAR | Status: DC | PRN
  Administered 2017-01-07: 4 mg via INTRAMUSCULAR

## 2017-01-07 MED ORDER — PANTOPRAZOLE SODIUM 40 MG IV SOLR
40.0000 mg | Freq: Two times a day (BID) | INTRAVENOUS | Status: DC
  Administered 2017-01-07 – 2017-01-08 (×2): 40 mg via INTRAVENOUS
  Filled 2017-01-07 (×3): qty 40

## 2017-01-07 MED ORDER — PROCHLORPERAZINE EDISYLATE 5 MG/ML IJ SOLN: 10.0000 mg | INTRAMUSCULAR | Status: DC | PRN

## 2017-01-07 MED ORDER — ONDANSETRON HCL 4 MG/2ML IJ SOLN
INTRAMUSCULAR | Status: AC
  Filled 2017-01-07: qty 2

## 2017-01-07 NOTE — Progress Notes (Signed)
Pt has been c/o nausea and vomiting. Scheduled Zofran has been utilized. Pt had emesis this morning and it came out of his nose. Almost passed out.  RN made pt. NPO and called Jesusita Okaan, NP. Ordered for KUB to r/o obstructions. Chest X-ray was done yesterday. VS are low (see chart). BG 234. Continue to feel nauseous and vomiting.

## 2017-01-07 NOTE — Progress Notes (Addendum)
[facility] PHYSICAL MEDICINE & REHABILITATION     PROGRESS NOTE  Subjective/Complaints:  Pt seen laying in bed this AM.  He states he had a rough night with intractable nausea and vomitting.  Spoke with PA regarding issues overnight.   ROS: +Nausea, vomiting, reflux. Denies CP, SOB, Diarrhea.  Objective: Vital Signs: Blood pressure 117/70, pulse 93, temperature 98.1 F (36.7 C), temperature source Oral, resp. rate 17, height 6\' 2"  (1.88 m), weight (!) 147.9 kg (326 lb 1 oz), SpO2 96 %. Dg Chest 2 View  Result Date: 01/06/2017 CLINICAL DATA:  Elevated white blood cell count, recent CVA. History of diabetes, chronic renal insufficiency, former smoker. EXAM: CHEST  2 VIEW COMPARISON:  PA and lateral chest x-ray of December 31, 2016 FINDINGS: The lungs are adequately inflated. There is stable linear density in the periphery of the left mid lung. The interstitial markings are slightly more conspicuous overall. There is no alveolar infiltrate. There is no pleural effusion. The cardiac silhouette remains enlarged. The pulmonary vascularity is mildly prominent today. IMPRESSION: Cardiomegaly with slight increased prominence of the pulmonary vascularity and interstitium may reflect low-grade CHF. There is no alveolar pneumonia. Electronically Signed   By: David  SwazilandJordan M.D.   On: 01/06/2017 13:50   Dg Abd 1 View  Result Date: 01/07/2017 CLINICAL DATA:  Nausea and vomiting. EXAM: ABDOMEN - 1 VIEW COMPARISON:  None. FINDINGS: Scattered gas and stool throughout the colon. No small or large bowel distention. No radiopaque stones. Degenerative changes in the lumbar spine. IMPRESSION: Nonobstructive bowel gas pattern. Electronically Signed   By: Burman NievesWilliam  Stevens M.D.   On: 01/07/2017 05:48    Recent Labs  01/06/17 0520  WBC 16.0*  HGB 14.9  HCT 44.7  PLT 263    Recent Labs  01/06/17 0520  NA 134*  K 4.0  CL 94*  GLUCOSE 184*  BUN 12  CREATININE 0.99  CALCIUM 8.8*   CBG (last 3)   Recent  Labs  01/06/17 2125 01/07/17 0511 01/07/17 0624  GLUCAP 139* 234* 214*    Wt Readings from Last 3 Encounters:  01/02/17 (!) 147.9 kg (326 lb 1 oz)  12/31/16 (!) 147.9 kg (326 lb)    Physical Exam:  BP 117/70 (BP Location: Right Arm)   Pulse 93   Temp 98.1 F (36.7 C) (Oral)   Resp 17   Ht 6\' 2"  (1.88 m)   Wt (!) 147.9 kg (326 lb 1 oz)   SpO2 96%   BMI 41.86 kg/m  Constitutional: He appears well-developed. Obese. HENT: Normocephalicand atraumatic.  Eyes: EOMI. No discharge.  Cardiovascular: RRR. No JVD. Respiratory: Effort normal and breath sounds normal. No stridor.  GI: Soft. Bowel sounds are normal.  Musculoskeletal: No edema. No tenderness.  Neurological: He is alertand oriented. Motor: 5/5 throughout. No ataxia Skin: Skin is warmand dry.  Hematoma LLQ abdomen Psychiatric: He has a normal mood and affect. His behavior is normal. Thought contentnormal   Assessment/Plan: 1. 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 Physiatrist is providing close team supervision and 24 hour management of active medical problems listed below. Physiatrist and rehab team continue to assess barriers to discharge/monitor patient progress toward functional and medical goals.  Function:  Bathing Bathing position   Position: Shower  Bathing parts Body parts bathed by patient: Right arm, Left arm, Chest, Abdomen, Front perineal area, Buttocks, Right upper leg, Left upper leg Body parts bathed  by helper: Right lower leg, Left lower leg, Back  Bathing assist        Upper Body Dressing/Undressing Upper body dressing   What is the patient wearing?: Pull over shirt/dress     Pull over shirt/dress - Perfomed by patient: Thread/unthread right sleeve, Thread/unthread left sleeve, Put head through opening, Pull shirt over trunk          Upper body assist Assist Level: Set  up      Lower Body Dressing/Undressing Lower body dressing   What is the patient wearing?: Underwear, Pants, Non-skid slipper socks Underwear - Performed by patient: Thread/unthread right underwear leg, Thread/unthread left underwear leg, Pull underwear up/down   Pants- Performed by patient: Thread/unthread right pants leg, Pull pants up/down, Thread/unthread left pants leg   Non-skid slipper socks- Performed by patient: Don/doff right sock, Don/doff left sock Non-skid slipper socks- Performed by helper: Don/doff left sock     Shoes - Performed by patient: Don/doff right shoe, Fasten right Shoes - Performed by helper: Don/doff left shoe, Fasten left          Lower body assist Assist for lower body dressing: Set up   Set up : To obtain clothing/put away  Toileting Toileting   Toileting steps completed by patient: Adjust clothing prior to toileting, Performs perineal hygiene, Adjust clothing after toileting   Toileting Assistive Devices: Grab bar or rail  Toileting assist Assist level: More than reasonable time   Transfers Chair/bed transfer   Chair/bed transfer method: Ambulatory Chair/bed transfer assist level: Supervision or verbal cues Chair/bed transfer assistive device: Armrests     Locomotion Ambulation     Max distance: 150 Assist level: Supervision or verbal cues   Wheelchair   Type: Manual Max wheelchair distance: 150 Assist Level: Supervision or verbal cues  Cognition Comprehension Comprehension assist level: Follows complex conversation/direction with no assist  Expression Expression assist level: Expresses complex ideas: With no assist  Social Interaction Social Interaction assist level: Interacts appropriately with others - No medications needed.  Problem Solving Problem solving assist level: Solves complex problems: Recognizes & self-corrects  Memory Memory assist level: More than reasonable amount of time    Medical Problem List and Plan: 1.  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   Cont CIR 2. DVT Prophylaxis/Anticoagulation: Pharmaceutical: Lovenox 3. Pain Management: N/A 4. Mood: LCSW to follow for evaluation and support.  5. Neuropsych: This patient iscapable of making decisions on hisown behalf. 6. Skin/Wound Care: Routine pressure relief measures.  7. Fluids/Electrolytes/Nutrition: Monitor I/O.  8. HTN: Monitor BP bid  Lisinopril 5 started 2/12, increased on 2/13, d/ced on 2/14 due to ?side effects  Norvasc 2.5 started on 2/14 9. T2DM: Hgb A1C- 9.5. He reports that he has not been fully compliant with meds due to malaise for months. Monitor BS ac/hs.   Metformin increased to 1000 BID on 2/11, d/ced on 2/14  Lantus 10U started daily on 2/13  Use SSI for elevated BS.   Monitor with increased mobility 10. Dyslipidemia: Now on Lipitor 11. Hyponatremia:   Na+ 134 on 2/13 12. Leucocytosis: Reports recent bout of PNA. No respiratory symptoms reported.   WBCs 16.0 on 2/13  Cont to monitor  Ua Neg, Ucx pending  CXR 2/13 reviewed, no infectious process  Labs ordered for tomorrow 13 CKD?, more likely AKI: SCr-1.34 at admission.   Cr. 0.99 on 2/13  Cont to monitor 14. Severe Nausea/Dizziness: Ordered vestibular evaluation.  Continue prn zofran for nausea, increased dose on 2/13  Scopalomine patch started 2/13  KUB unremarkable  Pt with difficulty overnight and continues to have unalleviated nausea with vomiting resulting in fatigue and lethargy 15. Morbid obesity: Educated appropriate diet and on importance of weight loss to help promote overall health and function.  16. ?GERD  Pepcid started 2/13, changed to protonix on 2/14  LOS (Days) 5 A FACE TO FACE EVALUATION WAS PERFORMED  Maurizio Geno Karis Juba 01/07/2017 8:37 AM

## 2017-01-07 NOTE — Progress Notes (Signed)
Occupational Therapy Session Note  Patient Details  Name: Nicholas Mejia MRN: [id number] Date of Birth: [birth year]  Today's Date: 01/07/2017 OT Individual Time: [id number] OT Individual Time Calculation (min): 25 min  and Today's Date: 01/07/2017 OT Missed Time: 15 Minutes Missed Time Reason: Patient ill (comment);Patient fatigue   Short Term Goals: Week 1:  OT Short Term Goal 1 (Week 1): Pt will ambulate into bathroom for toilet transfer with Min A OT Short Term Goal 2 (Week 1): Pt will complete LB dressing with supervision  OT Short Term Goal 3 (Week 1): Pt will complete bathing with supervision and AE as needed OT Short Term Goal 4 (Week 1): Pt will ambulate in room to gather clothing items in prep for ADLs with Min A  Skilled Therapeutic Interventions/Progress Updates:    Attempted to engage pt in scheduled OT treatment session.  Pt asleep on side upon arrival with lights off.  Pt reporting feeling "horrible" and having 3 bouts of vomiting over night and reflux pain in chest.  Engaged in discussion regarding increased arousal with lights on to decrease symptoms of nausea/dizziness per PA recommendations.  Pt reporting not able to engage in vestibular exercises this date due to nausea.  However, did report last night feeling well enough to engage in bathing at shower level with family present.  Educated on current goals and rehab safety plan.  Pt declining therapy at this time due to reflux, nausea, and fatigue.  RN reporting pt will most likely not participate in any therapy this date.  Therapist spoke with PA who reports pt is getting up without assistance and may be "overdoing" it when he feels well.  Need to continue to educate on pacing himself and participating in moderate resistance therapies to continue to build tolerance and retrain vestibular system.  Therapy Documentation Precautions:  Precautions Precautions: Fall Precaution Comments: monitor vitals, permissive HTN, DM, left  LOBs Restrictions Weight Bearing Restrictions: No General: General OT Amount of Missed Time: 15 Minutes Pain:  Pt with no c/o pain, just nausea  See Function Navigator for Current Functional Status.   Therapy/Group: Individual Therapy  Rosalio LoudHOXIE, Ananya Mccleese 01/07/2017, 9:46 AM

## 2017-01-07 NOTE — Progress Notes (Signed)
Physical Therapy Session Note  Patient Details  Name: Nicholas Mejia MRN: [id number] Date of Birth: 12-14-1970  Today's Date: 01/07/2017     Short Term Goals: Week 1:  PT Short Term Goal 1 (Week 1): Pt will perform bed mobility and bed <> chair transfers supervision PT Short Term Goal 2 (Week 1): Pt will ambulate x 150' with min A and reporting 50% less dizziness/nausea PT Short Term Goal 3 (Week 1): Pt will negotiate 12 stairs with R rail and min A PT Short Term Goal 4 (Week 1): Pt will perform vestibular and visual habituation exercises with supervision  Skilled Therapeutic Interventions/Progress Updates:   PT attempted to see patient for therapy. Patient reports severe N/V eariler in day and that he is unable to participate in therapy. PT offer bed level therapy or vestibular exercises only, but patient continued to decline therapy. PT will re-attempt at later time.      Therapy Documentation Precautions:  Precautions Precautions: Fall Precaution Comments: monitor vitals, permissive HTN, DM, left LOBs Restrictions Weight Bearing Restrictions: No General: PT Amount of Missed Time (min): 45 Minutes PT Missed Treatment Reason: Patient ill (Comment) (N/V) Vital Signs: Therapy Vitals Temp: 98 F (36.7 C) Temp Source: Oral Pulse Rate: 90 BP: (!) 150/80 Patient Position (if appropriate): Lying Oxygen Therapy SpO2: 98 % O2 Device: Not Delivered   See Function Navigator for Current Functional Status.   Therapy/Group: Individual Therapy  Golden Popustin E Adante Courington 01/07/2017, 5:43 PM

## 2017-01-07 NOTE — Progress Notes (Signed)
Social Work Patient ID: Nicholas Mejia, male   DOB: 10-14-1971, 46 y.o.   MRN: [id number]  Met with pt to discuss team conference goals mod/i level and target discharge 2/17. He is wondering how he can go home being sick Like he is. He has better times at night time and then he does and then the morning comes and he is sick again. He will talk with MD and see what his plans are for his treatment. Will work on getting him ready for Discharge Sat.

## 2017-01-07 NOTE — Progress Notes (Signed)
Occupational Therapy Note  Patient Details  Name: Benetta SparWarren Miu MRN: [id number] Date of Birth: Jul 25, 1971  Today's Date: 01/07/2017 OT Missed Time: 30 Minutes Missed Time Reason: Patient ill (comment);Patient fatigue  Pt missed 30 mins scheduled OT treatment session secondary to RN care and pt refusal.  RN in to start IV fluids.  Able to ask questions regarding ringing and fullness in ears, with pt reporting fullness in Lt ear and ringing in bilateral ears.  Reports increased dizziness with attempts to come to sitting at EOB this date.  Will continue to follow and attempt to address vestibular/dizzness symptoms and ADL safety.   Rosalio LoudHOXIE, Blessin Kanno 01/07/2017, 11:56 AM

## 2017-01-07 NOTE — Progress Notes (Signed)
Occupational Therapy Note  Patient Details  Name: Nicholas Mejia MRN: [id number] Date of Birth: 10/25/1971  Today's Date: 01/07/2017 OT Missed Time: 75 Minutes Missed Time Reason: Patient ill (comment)  Upon entering the room, pt supine in bed with visitor present in room. Pt continues to report nausea and declined OT intervention at this time. Pt states, "When I just sit up on the side of bed I get sick." RN notified and pt remained supine with call bell and all needed materials within reach.    Alen BleacherBradsher, Sherisa Gilvin P 01/07/2017, 3:22 PM

## 2017-01-08 ENCOUNTER — Inpatient Hospital Stay (HOSPITAL_COMMUNITY): Payer: 59 | Admitting: Physical Therapy

## 2017-01-08 ENCOUNTER — Inpatient Hospital Stay (HOSPITAL_COMMUNITY): Payer: BLUE CROSS/BLUE SHIELD | Admitting: Occupational Therapy

## 2017-01-08 DIAGNOSIS — R112 Nausea with vomiting, unspecified: Secondary | ICD-10-CM | POA: Insufficient documentation

## 2017-01-08 DIAGNOSIS — K30 Functional dyspepsia: Secondary | ICD-10-CM | POA: Insufficient documentation

## 2017-01-08 LAB — GLUCOSE, CAPILLARY
GLUCOSE-CAPILLARY: 165 mg/dL — AB (ref 65–99)
GLUCOSE-CAPILLARY: 160 mg/dL — AB (ref 65–99)
GLUCOSE-CAPILLARY: 282 mg/dL — AB (ref 65–99)
Glucose-Capillary: 143 mg/dL — ABNORMAL HIGH (ref 65–99)

## 2017-01-08 LAB — CBC WITH DIFFERENTIAL/PLATELET
BASOS PCT: 0 %
Basophils Absolute: 0 10*3/uL (ref 0.0–0.1)
EOS ABS: 0.1 10*3/uL (ref 0.0–0.7)
EOS PCT: 1 %
HCT: 43.8 % (ref 39.0–52.0)
Hemoglobin: 14 g/dL (ref 13.0–17.0)
LYMPHS ABS: 3.3 10*3/uL (ref 0.7–4.0)
Lymphocytes Relative: 25 %
MCH: 28.8 pg (ref 26.0–34.0)
MCHC: 32 g/dL (ref 30.0–36.0)
MCV: 90.1 fL (ref 78.0–100.0)
Monocytes Absolute: 1 10*3/uL (ref 0.1–1.0)
Monocytes Relative: 7 %
Neutro Abs: 8.9 10*3/uL — ABNORMAL HIGH (ref 1.7–7.7)
Neutrophils Relative %: 67 %
PLATELETS: 262 10*3/uL (ref 150–400)
RBC: 4.86 MIL/uL (ref 4.22–5.81)
RDW: 13.2 % (ref 11.5–15.5)
WBC: 13.3 10*3/uL — AB (ref 4.0–10.5)

## 2017-01-08 MED ORDER — AMLODIPINE BESYLATE 5 MG PO TABS
5.0000 mg | ORAL_TABLET | Freq: Every day | ORAL | Status: DC
  Administered 2017-01-08: 5 mg via ORAL
  Filled 2017-01-08: qty 1

## 2017-01-08 MED ORDER — INSULIN GLARGINE 100 UNIT/ML ~~LOC~~ SOLN
15.0000 [IU] | Freq: Every day | SUBCUTANEOUS | Status: DC
  Administered 2017-01-08 – 2017-01-10 (×3): 15 [IU] via SUBCUTANEOUS
  Filled 2017-01-08 (×3): qty 0.15

## 2017-01-08 MED ORDER — AMLODIPINE BESYLATE 5 MG PO TABS
5.0000 mg | ORAL_TABLET | Freq: Every day | ORAL | Status: DC
  Administered 2017-01-08 – 2017-01-10 (×3): 5 mg via ORAL
  Filled 2017-01-08 (×3): qty 1

## 2017-01-08 MED ORDER — PANTOPRAZOLE SODIUM 40 MG IV SOLR
40.0000 mg | Freq: Two times a day (BID) | INTRAVENOUS | Status: DC
  Administered 2017-01-08: 40 mg via INTRAVENOUS
  Filled 2017-01-08 (×3): qty 40

## 2017-01-08 MED ORDER — PANTOPRAZOLE SODIUM 40 MG PO PACK: 40.0000 mg | PACK | Freq: Two times a day (BID) | ORAL | Status: DC

## 2017-01-08 NOTE — Patient Care Conference (Signed)
Inpatient RehabilitationTeam Conference and Plan of Care Update Date: 01/07/2017   Time: 11:25 Am    Patient Name: Nicholas Mejia      Medical Record Number: [id number]  Date of Birth: July 06, 1971 Sex: Male         Room/Bed: 4M01C/4M01C-01 Payor Info: Payor: Advertising copywriter / Plan: Advertising copywriter OTHER / Product Type: *No Product type* /    Admitting Diagnosis: L CVA  Admit Date/Time:  01/02/2017  3:16 PM Admission Comments: No comment available   Primary Diagnosis:  <principal problem not specified> Principal Problem: <principal problem not specified>  Patient Active Problem List   Diagnosis Date Noted  . Nausea & vomiting   . Indigestion   . Gastroesophageal reflux disease   . Uncontrolled type 2 diabetes mellitus with complication, without long-term current use of insulin (HCC)   . Non-intractable vomiting with nausea   . Stage 2 chronic kidney disease   . Ataxia, post-stroke   . Stage 3 chronic kidney disease   . Hyponatremia   . Type II diabetes mellitus, uncontrolled (HCC)   . Benign essential HTN   . Mixed hyperlipidemia   . 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 Diabetes mellitus without complication (HCC) 12/31/2016  . AKI (acute kidney injury) (HCC) 12/31/2016  . Leukocytosis 12/31/2016  . Essential hypertension 12/31/2016    Expected Discharge Date: Expected Discharge Date: 01/10/17  Team Members Present: Physician leading conference: Dr. 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   Improve mobility, safety, nausea, vomitting, DM, HTN, AKI, leukocytosis  See  above, mainly nausea/vomitting   Bowel/Bladder   Pt continent of bowel and bladder.   Mod I  contiune POC   Swallow/Nutrition/ Hydration             ADL's   min guard - supervision overall.  Participation limited secondary to nausea and vomiting  Mod I overall, except supervision bathing and shower transfer  management of vestibular symptoms, high level balance, ADL retraining, d/c planning   Mobility   supervision  mod I  vesitbular retraining, d/c planning, grad day   Communication             Safety/Cognition/ Behavioral Observations            Pain   no pain  less<2  monitor every shift   Skin   scattered bruising   free skin breakdown mod I  assess skin q shift      *See Care Plan and progress notes for long and short-term goals.  Barriers to Discharge: Mobility, safety, N/V, DM, HTN, AKI, leukocytosis    Possible Resolutions to Barriers:  Therapies, Optimize BP, HTN meds, workup for leukocyotisis, N/V    Discharge Planning/Teaching Needs:  Home with friends coming by intermittently to assist with grogeries and appointments. Pt having difficulty with nausea and dizziness.      Team Discussion:  Goals-mod/i-supervision for shower transfers. Still continuing to deal with nausea and vomiting due to vestibular issues. Has exercises to do. MD adjusting meds for BP and diabetes. Zofran ordered for nausea and patch added. GERD meds started. Pt will have intermittent assist  from friends.  Revisions to Treatment Plan:  DC 2/17   Continued Need for Acute Rehabilitation Level of Care: The patient requires daily medical management by a physician with specialized training in physical medicine and rehabilitation for the following conditions: Daily direction of a multidisciplinary physical rehabilitation program to ensure safe treatment while eliciting the highest outcome that is of practical value to the patient.: Yes Daily medical management of patient stability for increased  activity during participation in an intensive rehabilitation regime.: Yes Daily analysis of laboratory values and/or radiology reports with any subsequent need for medication adjustment of medical intervention for : Neurological problems;Diabetes problems;Blood pressure problems;Other  Lucy ChrisDupree, Ephrem Carrick G 01/08/2017, 9:23 AM

## 2017-01-08 NOTE — Progress Notes (Signed)
Occupational Therapy Session Note  Patient Details  Name: Nicholas Mejia MRN: [id number] Date of Birth: [birth year]  Today's Date: 01/08/2017 OT Individual Time: [id number] and 1130-1200 OT Individual Time Calculation (min): 60 min and 30 min   Short Term Goals: Week 1:  OT Short Term Goal 1 (Week 1): Pt will ambulate into bathroom for toilet transfer with Min A OT Short Term Goal 2 (Week 1): Pt will complete LB dressing with supervision  OT Short Term Goal 3 (Week 1): Pt will complete bathing with supervision and AE as needed OT Short Term Goal 4 (Week 1): Pt will ambulate in room to gather clothing items in prep for ADLs with Min A  Skilled Therapeutic Interventions/Progress Updates:    1) Treatment session with focus on functional mobility and dynamic balance while managing vestibular symptoms/dizziness.  Pt declined bathing and dressing, expressing desire to focus on balance activities and strengthening.  Pt donned shoes seated EOB with no reports of dizziness. Ambulated to therapy gym with therapist managing IV pole and providing supervision.  Engaged in ball toss in sitting and standing with focus on visual tracking and management of symptoms.  Utilized 2 kg medicine ball with PNF pattern reaching to include trunk rotation and visual scanning.  Engaged in horse shoe toss in standing with focus on dynamic standing balance and ambulating to pick up items from floor.  Pt able to pick up items with 1 LOB during stooping, requiring mod assist to correct.  Also with LOB when returning to therapy mat and turning quickly to sit down.  Engaged in discussion regarding safety with picking items up from floor with recommendations to utilize counter tops for UE support or sitting to retreive items when available as well as planning ahead to minimize need to pick items from floor.  Returned to room and left seated EOB.  Pt reports dizziness approx 3/10 at end of session.  2) Treatment session with focus on  functional mobility and management of vestibular symptoms/dizziness.  Engaged in single hand ball toss in Rt and then Lt hand, with pt demonstrating decreased motor control in Lt hand.  Progressed to ball toss Rt to Lt hand to challenge visual tracking with pt reports no increase in dizziness.  Newman PiesBall toss up with focus on tracking and head pitches with pt managing symptoms with lower tosses.  Returned to room and left with small ball to continue to engage in ball toss with focus on motor control and visual tracking.  Therapy Documentation Precautions:  Precautions Precautions: Fall Precaution Comments: monitor vitals, permissive HTN, DM, left LOBs Restrictions Weight Bearing Restrictions: No General:   Vital Signs: Therapy Vitals Temp: 97.7 F (36.5 C) Temp Source: Oral Pulse Rate: 81 Resp: 18 BP: (!) 146/81 Patient Position (if appropriate): Lying Oxygen Therapy SpO2: 97 % O2 Device: Not Delivered Pain:  Pt with no c/o pain.  Reports mild ringing in bilateral ears.  See Function Navigator for Current Functional Status.   Therapy/Group: Individual Therapy  Rosalio LoudHOXIE, Emmelyn Schmale 01/08/2017, 9:57 AM

## 2017-01-08 NOTE — Progress Notes (Signed)
Physical Therapy Session Note  Patient Details  Name: Nicholas Mejia MRN: [id number] Date of Birth: [birth year]  Today's Date: 01/08/2017 PT Individual Time: 1300-1400 PT Individual Time Calculation (min): 60 min   Short Term Goals: Week 1:  PT Short Term Goal 1 (Week 1): Pt will perform bed mobility and bed <> chair transfers supervision PT Short Term Goal 2 (Week 1): Pt will ambulate x 150' with min A and reporting 50% less dizziness/nausea PT Short Term Goal 3 (Week 1): Pt will negotiate 12 stairs with R rail and min A PT Short Term Goal 4 (Week 1): Pt will perform vestibular and visual habituation exercises with supervision  Skilled Therapeutic Interventions/Progress Updates:    no c/o pain, rates vestibular symptoms at 3/10.  Session focus on gait in controlled environment and dynamic/static standing balance.    Pt ambulates from room<>dayroom with supervision with 1 standing rest break in each direction.  Pt reports he rests when he feels that he is starting to drift to the L.  Pt participated in [place] simulated activities with focus on dynamic and static standing balance, midline orientation, weight shifting, and righting reactions.  Pt completed 3 trials of Wii Skiing and 2 trials of Wii Tilt Table with supervision.  Pt participated in basketball game focus on visual tracking, coordination, anticipatory postural reactions, and dynamic balance.  Pt returned to room at end of session, carrying his own plate of food for increased balance challenge.  Pt seated EOB with call bell in reach and needs met.   Therapy Documentation Precautions:  Precautions Precautions: Fall Precaution Comments: monitor vitals, permissive HTN, DM, left LOBs Restrictions Weight Bearing Restrictions: No   See Function Navigator for Current Functional Status.   Therapy/Group: Individual Therapy  Earnest Conroy Penven-Crew 01/08/2017, 2:31 PM

## 2017-01-08 NOTE — Progress Notes (Signed)
Social Work Patient ID: Nicholas Mejia, male   DOB: 1971-08-02, 46 y.o.   MRN: [id number]  Met with pt to discuss discharge needs. He feels much better today and feels he will be ready to go home Sat. Discussed equipment needs-shower chair, he is aware private pay and wants this worker to get one. He feels needs to have home health due to transportation issues, friends all work.  Will work on discharge needs, pt's Mom plans to come for a few days and help with his transition home.

## 2017-01-08 NOTE — Evaluation (Signed)
Recreational Therapy Assessment and Plan  Patient Details  Name: Nicholas Mejia MRN: [id number] Date of Birth: Jun 12, 1971 Today's Date: 01/08/2017  Rehab Potential: Good  ELOS: d/c 2/17  Assessment Clinical Impression: Problem List:      Patient Active Problem List   Diagnosis Date Noted  . Stage 3 chronic kidney disease   . Hyponatremia   . Type II diabetes mellitus, uncontrolled ([place])   . Benign essential HTN   . Mixed hyperlipidemia   . 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 Stroke (cerebrum) ([facility]) 12/31/2016  . Diabetes mellitus without complication ([place]) 74/10/8785  . AKI (acute kidney injury) ([place]) 12/31/2016  . Leukocytosis 12/31/2016  . Essential hypertension 12/31/2016    Past Medical History:      Past Medical History:  Diagnosis Date  . Diabetes mellitus without complication [facility])    Past Surgical History:       Past Surgical History:  Procedure Laterality Date  . EXTERNAL EAR SURGERY      Assessment & Plan Clinical Impression: Patient is a 46 y.o.malewith history of HTN, T2DM, tobacco use (quit 6 weeks ago) who was admitted to [facility] on 12/31/16 with left facial numbness, HA and unsteady gait. 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 2D echo with contrast revealed EF 55-60% and no wall abnormality or thrombus. Dr. Leonie Man recommended ASA and plavix for thrombotic stroke due to L-VA stenosis/large vessel disease.  Patient transferred to CIR on 01/02/2017.   Met with pt bedlevel to discuss TR services, use of leisure time & relaxation training.  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  Pt stated he is not easily stressed but lately has had  a lot on him mainly due to taking care of his uncle and felt that family did not understand how tired and ill he was becoming until this hospitalization.  Emotional support provided including identifying ways to unwind and relax once home and developing & implementing a plan for relaxation training once.  Pt agreeable.  Plan No further TR as pt is discharging 2/17  Recommendations for other services: None   Discharge Criteria: Patient will be discharged from TR if patient refuses treatment 3 consecutive times without medical reason.  If treatment goals not met, if there is a change in medical status, if patient makes no progress towards goals or if patient is discharged from hospital.  The above assessment, treatment plan, treatment alternatives and goals were discussed and mutually agreed upon: by patient  Lynn 01/08/2017, 1:27 PM

## 2017-01-08 NOTE — Progress Notes (Signed)
[facility] [facility] PHYSICAL MEDICINE & REHABILITATION     PROGRESS NOTE  Subjective/Complaints:  Pt seen sitting up at the edge of the bed this AM, eating breakfast.  He slept well overnight.  He states he feels "a lot better". He believes the cause is multifactorial with indigestion playing a large role.    ROS: +Nausea, reflux. Denies CP, SOB, Diarrhea.  Objective: Vital Signs: Blood pressure (!) 146/81, pulse 81, temperature 97.7 F (36.5 C), temperature source Oral, resp. rate 18, height 6\' 2"  (1.88 m), weight (!) 147.9 kg (326 lb 1 oz), SpO2 97 %. Dg Chest 2 View  Result Date: 01/06/2017 CLINICAL DATA:  Elevated white blood cell count, recent CVA. History of diabetes, chronic renal insufficiency, former smoker. EXAM: CHEST  2 VIEW COMPARISON:  PA and lateral chest x-ray of December 31, 2016 FINDINGS: The lungs are adequately inflated. There is stable linear density in the periphery of the left mid lung. The interstitial markings are slightly more conspicuous overall. There is no alveolar infiltrate. There is no pleural effusion. The cardiac silhouette remains enlarged. The pulmonary vascularity is mildly prominent today. IMPRESSION: Cardiomegaly with slight increased prominence of the pulmonary vascularity and interstitium may reflect low-grade CHF. There is no alveolar pneumonia. Electronically Signed   By: David  [place] M.D.   On: 01/06/2017 13:50   Dg Abd 1 View  Result Date: 01/07/2017 CLINICAL DATA:  Nausea and vomiting. EXAM: ABDOMEN - 1 VIEW COMPARISON:  None. FINDINGS: Scattered gas and stool throughout the colon. No small or large bowel distention. No radiopaque stones. Degenerative changes in the lumbar spine. IMPRESSION: Nonobstructive bowel gas pattern. Electronically Signed   By: Burman Nieves M.D.   On: 01/07/2017 05:48    Recent Labs  01/06/17 0520 01/08/17 0617  WBC 16.0* 13.3*  HGB 14.9 14.0  HCT 44.7 43.8  PLT 263 262    Recent Labs  01/06/17 0520  NA 134*  K 4.0   CL 94*  GLUCOSE 184*  BUN 12  CREATININE 0.99  CALCIUM 8.8*   CBG (last 3)   Recent Labs  01/07/17 1639 01/07/17 2048 01/08/17 0654  GLUCAP 167* 183* 165*    Wt Readings from Last 3 Encounters:  01/02/17 (!) 147.9 kg (326 lb 1 oz)  12/31/16 (!) 147.9 kg (326 lb)    Physical Exam:  BP (!) 146/81 (BP Location: Right Arm)   Pulse 81   Temp 97.7 F (36.5 C) (Oral)   Resp 18   Ht 6\' 2"  (1.88 m)   Wt (!) 147.9 kg (326 lb 1 oz)   SpO2 97%   BMI 41.86 kg/m  Constitutional: He appears well-developed. Obese. HENT: Normocephalicand atraumatic.  Eyes: EOMI. No discharge.  Cardiovascular: RRR. No JVD. Respiratory: Effort normal and breath sounds normal. No stridor.  GI: Soft. Bowel sounds are normal.  Musculoskeletal: No edema. No tenderness.  Neurological: He is alertand oriented. Motor: 5/5 throughout. No ataxia Skin: Skin is warmand dry.  Hematoma LLQ abdomen Psychiatric: He has a normal mood and affect. His behavior is normal. Thought contentnormal   Assessment/Plan: 1. 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 Physiatrist is providing close team supervision and 24 hour management of active medical problems listed below. Physiatrist and rehab team continue to assess barriers to discharge/monitor patient progress toward functional and medical goals.  Function:  Bathing Bathing position   Position: Shower  Bathing parts Body parts bathed by  patient: Right arm, Left arm, Chest, Abdomen, Front perineal area, Buttocks, Right upper leg, Left upper leg Body parts bathed by helper: Right lower leg, Left lower leg, Back  Bathing assist        Upper Body Dressing/Undressing Upper body dressing   What is the patient wearing?: Pull over shirt/dress     Pull over shirt/dress - Perfomed by patient: Thread/unthread right sleeve, Thread/unthread left sleeve,  Put head through opening, Pull shirt over trunk          Upper body assist Assist Level: Set up      Lower Body Dressing/Undressing Lower body dressing   What is the patient wearing?: Underwear, Pants, Non-skid slipper socks Underwear - Performed by patient: Thread/unthread right underwear leg, Thread/unthread left underwear leg, Pull underwear up/down   Pants- Performed by patient: Thread/unthread right pants leg, Pull pants up/down, Thread/unthread left pants leg   Non-skid slipper socks- Performed by patient: Don/doff right sock, Don/doff left sock Non-skid slipper socks- Performed by helper: Don/doff left sock     Shoes - Performed by patient: Don/doff right shoe, Fasten right Shoes - Performed by helper: Don/doff left shoe, Fasten left          Lower body assist Assist for lower body dressing: Set up   Set up : To obtain clothing/put away  Toileting Toileting   Toileting steps completed by patient: Adjust clothing prior to toileting, Performs perineal hygiene, Adjust clothing after toileting   Toileting Assistive Devices: Grab bar or rail  Toileting assist Assist level: More than reasonable time   Transfers Chair/bed transfer   Chair/bed transfer method: Ambulatory Chair/bed transfer assist level: Supervision or verbal cues Chair/bed transfer assistive device: Armrests     Locomotion Ambulation     Max distance: 150 Assist level: Supervision or verbal cues   Wheelchair   Type: Manual Max wheelchair distance: 150 Assist Level: Supervision or verbal cues  Cognition Comprehension Comprehension assist level: Follows complex conversation/direction with no assist  Expression Expression assist level: Expresses complex ideas: With no assist  Social Interaction Social Interaction assist level: Interacts appropriately with others - No medications needed.  Problem Solving Problem solving assist level: Solves complex problems: Recognizes & self-corrects  Memory  Memory assist level: More than reasonable amount of time    Medical Problem List and Plan: 1. 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   Cont CIR 2. DVT Prophylaxis/Anticoagulation: Pharmaceutical: Lovenox 3. Pain Management: N/A 4. Mood: LCSW to follow for evaluation and support.  5. Neuropsych: This patient iscapable of making decisions on hisown behalf. 6. Skin/Wound Care: Routine pressure relief measures.  7. Fluids/Electrolytes/Nutrition: Monitor I/O.   Diet changed to D3, heart healthy, card modified per pt 8. HTN: Monitor BP bid  Lisinopril 5 started 2/12, increased on 2/13, d/ced on 2/14 due to ?side effects  Norvasc 2.5 started on 2/14, increased to 5 on 2/15 9. T2DM: Hgb A1C- 9.5. He reports that he has not been fully compliant with meds due to malaise for months. Monitor BS ac/hs.   Metformin increased to 1000 BID on 2/11, d/ced on 2/14  Lantus 10U started daily on 2/13, increased to 15U on 2/15  Use SSI for elevated BS.   Monitor with increased mobility 10. Dyslipidemia: Now on Lipitor 11. Hyponatremia:   Na+ 134 on 2/13 12. Leucocytosis: Reports recent bout of PNA. No respiratory symptoms reported.   WBCs 13.3 on 2/15  Cont to monitor  Ua Neg, Ucx insignificant groth  CXR 2/13 reviewed,  no infectious process 13 CKD?, more likely AKI: SCr-1.34 at admission.   Cr. 0.99 on 2/13  Cont to monitor 14. Severe Nausea/Dizziness: Ordered vestibular evaluation.  Continue prn zofran for nausea, increased dose on 2/13  Scopalomine patch started 2/13  KUB unremarkable  Improving 15. Morbid obesity: Educated appropriate diet and on importance of weight loss to help promote overall health and function.  16. ?GERD  Pepcid started 2/13, changed to protonix on 2/14  LOS (Days) 6 A FACE TO FACE EVALUATION WAS PERFORMED  Alexandru Moorer Karis Jubanil Hiawatha Dressel 01/08/2017 7:58 AM

## 2017-01-08 NOTE — Discharge Summary (Signed)
Physician Discharge Summary  Patient ID: Nicholas Mejia MRN: [id number] DOB/AGE: 05/06/1971 46 y.o.  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 History of diabetes, chronic renal insufficiency, former smoker. EXAM: CHEST  2 VIEW COMPARISON:  PA and lateral chest x-ray of December 31, 2016 FINDINGS: The lungs are adequately inflated. There is stable linear density in the periphery of the left mid lung. The interstitial markings are slightly more conspicuous overall. There is no alveolar infiltrate. There is no pleural effusion. The cardiac silhouette remains enlarged. The pulmonary vascularity is mildly prominent today. IMPRESSION: Cardiomegaly with slight increased prominence of the pulmonary vascularity and interstitium may reflect low-grade CHF. There is no alveolar pneumonia. Electronically Signed   By: David  Martinique M.D.   On: 01/06/2017 13:50    Dg Abd 1 View  Result Date: 01/07/2017 CLINICAL DATA:  Nausea and vomiting. EXAM: ABDOMEN - 1 VIEW COMPARISON:  None. FINDINGS: Scattered gas and stool throughout the colon. No small or large bowel distention. No radiopaque stones. Degenerative changes in the lumbar spine. IMPRESSION: Nonobstructive bowel gas pattern. Electronically Signed   By: Lucienne Capers M.D.   On: 01/07/2017 05:48    Ct Head Wo Contrast  Result Date: 01/02/2017 CLINICAL DATA:  Dizziness and weakness. EXAM: CT HEAD WITHOUT CONTRAST TECHNIQUE: Contiguous axial images were obtained from the base of the skull through the vertex without intravenous  contrast. COMPARISON:  CT of the head on 12/30/2016 and MRI of the brain on 12/31/2016. FINDINGS: Brain: Small low-density infarct in the left posterolateral medulla is visible by CT and was seen on recent MRI. No evidence of additional infarction, hemorrhage, hydrocephalus, extra-axial collection or mass lesion/mass effect. Vascular: No hyperdense vessel or unexpected calcification. Skull: Normal. Negative for fracture or focal lesion. Sinuses/Orbits: No acute finding. Other: None. IMPRESSION: The recently identified infarct in the left medulla is visible by CT. No additional acute findings. Electronically Signed   By: Aletta Edouard M.D.   On: 01/02/2017 13:09     Labs:  Basic Metabolic Panel:  Recent Labs Lab 01/06/17 0520 01/09/17 0419  NA 134* 135  K 4.0 3.7  CL 94* 95*  CO2 29 32  GLUCOSE 184* 183*  BUN 12 7  CREATININE 0.99 1.04  CALCIUM 8.8* 8.4*    CBC:  Recent Labs Lab 01/06/17 0520 01/08/17 0617 01/09/17 0419  WBC 16.0* 13.3* 13.4*  NEUTROABS 11.4* 8.9*  --   HGB 14.9 14.0 13.6  HCT 44.7 43.8 41.7  MCV 88.3 90.1 89.3  PLT 263 262 251    CBG:  Recent Labs Lab 01/09/17 1125 01/09/17 1641 01/09/17 2043 01/10/17 0706 01/10/17 1137  GLUCAP 253* 188* 229* 150* 199*    Brief HPI:   Nicholas Mejia a 46 y.o.malewith history of HTN, T2DM, tobacco use (quit 6 weeks ago) who was admitted to [facility] on 12/31/16 with left facial numbness, HA and unsteady gait. 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 2D echo with contrast revealed EF 55-60% and no wall abnormality or thrombus. Dr. Leonie Man recommended ASA and plavix for thrombotic stroke due to L-VA stenosis/large vessel disease. He reported worsening of symptoms with diplopia and nausea with activity this am. CT head stable and he was  treated with fluid bolus. Therapy ongoing and patient with ataxia affecting balance and gait with balance deficits and poor safety. CIR was recommended for follow up therapy.    Hospital Course: Elester Apodaca was admitted to rehab 01/02/2017 for inpatient therapies to consist of PT, ST and OT at least three hours five days a week. Past admission physiatrist, therapy team and rehab RN have worked together to provide customized collaborative inpatient rehab. Blood pressures were monitored on bid basis with permissive HTN for perfusion.  Lisinopril was added on 2/12 and titrated upwards for tighter control. Metformin was resumed and titrated to 1000 mg with addition of amaryl for tighter control. He continued to have bouts of nausea and vomiting which was worse at nights. scopalamine patch was added to help with vestibular symptoms. He did develop leucocytosis and CXR and Urine culture were negative for signs of infection. He developed intractable N/V on on 2/13 pm that last for 24 hours. Lisinopril and metformin were discontinued.  He was treated with IV fluids as well as IV zofran and protonix with resolutions of symptoms.  po intake is good and he is continent of bowel and bladder.  Patient was agreeable to use of lantus for better BS control and this has been titrated to 15 units. He has been educated on appropriate diet as well as importance of compliance with medications.  He has been educated on gaze stabilization techniques to help with vestibular issues.  He has made great progress and is modified independent at discharge. He will continue to receive HHPT and HHOT by [facility] at home after discharge.    Rehab course: During patient's stay in rehab weekly team conferences were held to monitor patient's progress, set goals and discuss barriers to discharge. At admission, patient required mod assist with mobility and basic self care tasks. He  has had improvement in activity tolerance, balance, postural  control, as well as ability to compensate for deficits.  He is modified independent for ADL tasks as well as mobility. He requires supervision to navigate a flight of stairs. His mother plans on providing supervision to transition to home after discharge.       Disposition: Home   Diet: Heart Healthy/Carb modified   Special Instructions: 1. Check blood sugars 2-3 time a day and record.  2. No driving till cleared by MD.   Discharge Instructions    Ambulatory referral to Physical Medicine Rehab    Complete by:  As directed    1-2 week transitional care     Allergies as of 01/10/2017      Reactions   Bee Venom Anaphylaxis, Swelling      Medication List    STOP taking these medications   chlorthalidone 25 MG tablet Commonly known as:  HYGROTON   fluticasone 50 MCG/ACT nasal spray Commonly known as:  FLONASE   fluticasone-salmeterol 115-21 MCG/ACT inhaler Commonly known as:  ADVAIR HFA   guaiFENesin-codeine 100-10 MG/5ML syrup   montelukast 10 MG tablet Commonly known as:  SINGULAIR   Turmeric 500 MG Tabs     TAKE these medications   amLODipine 5 MG tablet Commonly known as:  NORVASC Take 1 tablet (5 mg  total) by mouth daily.   aspirin EC 81 MG tablet Take 162 mg by mouth daily.   atorvastatin 40 MG tablet Commonly known as:  LIPITOR Take 1 tablet (40 mg total) by mouth daily at 6 PM.   blood glucose meter kit and supplies Kit Dispense based on patient and insurance preference. Use up to four times daily as directed. (FOR ICD-9 250.00, 250.01).   clopidogrel 75 MG tablet Commonly known as:  PLAVIX Take 1 tablet (75 mg total) by mouth daily.   insulin glargine 100 UNIT/ML injection Commonly known as:  LANTUS Inject 0.15 mLs (15 Units total) into the skin daily. What changed:  how much to take   Insulin Pen Needle 31G X 8 MM Misc 1 applicator by Does not apply route daily with breakfast.   ondansetron 4 MG tablet Commonly known as:  ZOFRAN Take 1 tablet  (4 mg total) by mouth every 8 (eight) hours as needed for nausea or vomiting.   pantoprazole 40 MG tablet Commonly known as:  PROTONIX Take 1 tablet (40 mg total) by mouth 2 (two) times daily.   scopolamine 1 MG/3DAYS Commonly known as:  TRANSDERM-SCOP Place 1 patch (1.5 mg total) onto the skin every 3 (three) days.   senna-docusate 8.6-50 MG tablet Commonly known as:  Senokot-S Take 2 tablets by mouth at bedtime.      Follow-up Information    Charlett Blake, MD Follow up.   Specialty:  Physical Medicine and Rehabilitation Why:  office will call you with follow up appointment Contact information: [place] [address] [phone]        Antony Contras, MD Follow up.   Specialties:  Neurology, Radiology Contact information: [address] [place] [address] [phone]        Phineas Inches, MD Follow up on 01/15/2017.   Specialty:  Family Medicine Why:  Appointment @ 4:00 PM Contact information: [place] [address] [phone]           Signed: Bary Leriche 01/12/2017, 7:47 AM

## 2017-01-09 ENCOUNTER — Inpatient Hospital Stay (HOSPITAL_COMMUNITY): Payer: BLUE CROSS/BLUE SHIELD | Admitting: Occupational Therapy

## 2017-01-09 ENCOUNTER — Inpatient Hospital Stay (HOSPITAL_COMMUNITY): Payer: 59 | Admitting: Physical Therapy

## 2017-01-09 ENCOUNTER — Inpatient Hospital Stay (HOSPITAL_COMMUNITY): Payer: 59 | Admitting: Occupational Therapy

## 2017-01-09 LAB — BASIC METABOLIC PANEL
ANION GAP: 8 (ref 5–15)
BUN: 7 mg/dL (ref 6–20)
CHLORIDE: 95 mmol/L — AB (ref 101–111)
CO2: 32 mmol/L (ref 22–32)
Calcium: 8.4 mg/dL — ABNORMAL LOW (ref 8.9–10.3)
Creatinine, Ser: 1.04 mg/dL (ref 0.61–1.24)
GFR calc Af Amer: >60 mL/min (ref >60)
Glucose, Bld: 183 mg/dL — ABNORMAL HIGH (ref 65–99)
POTASSIUM: 3.7 mmol/L (ref 3.5–5.1)
SODIUM: 135 mmol/L (ref 135–145)

## 2017-01-09 LAB — CBC
HEMATOCRIT: 41.7 % (ref 39.0–52.0)
HEMOGLOBIN: 13.6 g/dL (ref 13.0–17.0)
MCH: 29.1 pg (ref 26.0–34.0)
MCHC: 32.6 g/dL (ref 30.0–36.0)
MCV: 89.3 fL (ref 78.0–100.0)
Platelets: 251 10*3/uL (ref 150–400)
RBC: 4.67 MIL/uL (ref 4.22–5.81)
RDW: 13.4 % (ref 11.5–15.5)
WBC: 13.4 10*3/uL — AB (ref 4.0–10.5)

## 2017-01-09 LAB — GLUCOSE, CAPILLARY
GLUCOSE-CAPILLARY: 253 mg/dL — AB (ref 65–99)
GLUCOSE-CAPILLARY: 188 mg/dL — AB (ref 65–99)
Glucose-Capillary: 165 mg/dL — ABNORMAL HIGH (ref 65–99)
Glucose-Capillary: 229 mg/dL — ABNORMAL HIGH (ref 65–99)

## 2017-01-09 MED ORDER — SCOPOLAMINE 1 MG/3DAYS TD PT72: 1.0000 | MEDICATED_PATCH | TRANSDERMAL | 12 refills | Status: DC

## 2017-01-09 MED ORDER — INSULIN GLARGINE 100 UNIT/ML ~~LOC~~ SOLN: 15.0000 [IU] | Freq: Every day | SUBCUTANEOUS | 0 refills | Status: DC

## 2017-01-09 MED ORDER — SENNOSIDES-DOCUSATE SODIUM 8.6-50 MG PO TABS: 2.0000 | ORAL_TABLET | Freq: Every day | ORAL | 0 refills | Status: DC

## 2017-01-09 MED ORDER — CLOPIDOGREL BISULFATE 75 MG PO TABS: 75.0000 mg | ORAL_TABLET | Freq: Every day | ORAL | 0 refills | Status: AC

## 2017-01-09 MED ORDER — ONDANSETRON HCL 4 MG PO TABS: 4.0000 mg | ORAL_TABLET | Freq: Three times a day (TID) | ORAL | 0 refills | Status: DC | PRN

## 2017-01-09 MED ORDER — PANTOPRAZOLE SODIUM 40 MG PO TBEC: 40.0000 mg | DELAYED_RELEASE_TABLET | Freq: Two times a day (BID) | ORAL | 0 refills | Status: DC

## 2017-01-09 MED ORDER — PANTOPRAZOLE SODIUM 40 MG PO TBEC
40.0000 mg | DELAYED_RELEASE_TABLET | Freq: Two times a day (BID) | ORAL | Status: DC
  Administered 2017-01-09 – 2017-01-10 (×3): 40 mg via ORAL
  Filled 2017-01-09 (×3): qty 1

## 2017-01-09 MED ORDER — AMLODIPINE BESYLATE 5 MG PO TABS: 5.0000 mg | ORAL_TABLET | Freq: Every day | ORAL | 0 refills | Status: DC

## 2017-01-09 MED ORDER — ATORVASTATIN CALCIUM 40 MG PO TABS: 40.0000 mg | ORAL_TABLET | Freq: Every day | ORAL | 0 refills | Status: DC

## 2017-01-09 MED ORDER — BLOOD GLUCOSE MONITOR KIT: PACK | 0 refills | Status: DC

## 2017-01-09 MED ORDER — INSULIN PEN NEEDLE 31G X 8 MM MISC: 1.0000 | Freq: Every day | 0 refills | Status: DC

## 2017-01-09 NOTE — Progress Notes (Addendum)
[place] PHYSICAL MEDICINE & REHABILITATION     PROGRESS NOTE  Subjective/Complaints:  Pt seen sitting up at the edge of the bed this AM finishing breakfast.  He states he slept well overnight night and continues to feel better.   ROS: Denies CP, SOB, Diarrhea.  Objective: Vital Signs: Blood pressure (!) 149/73, pulse 90, temperature 98.4 F (36.9 C), temperature source Oral, resp. rate 18, height 6\' 2"  (1.88 m), weight (!) 147.9 kg (326 lb 1 oz), SpO2 97 %. No results found.  Recent Labs  01/08/17 0617 01/09/17 0419  WBC 13.3* 13.4*  HGB 14.0 13.6  HCT 43.8 41.7  PLT 262 251    Recent Labs  01/09/17 0419  NA 135  K 3.7  CL 95*  GLUCOSE 183*  BUN 7  CREATININE 1.04  CALCIUM 8.4*   CBG (last 3)   Recent Labs  01/08/17 1559 01/08/17 2135 01/09/17 0643  GLUCAP 143* 282* 165*    Wt Readings from Last 3 Encounters:  01/02/17 (!) 147.9 kg (326 lb 1 oz)  12/31/16 (!) 147.9 kg (326 lb)    Physical Exam:  BP (!) 149/73 (BP Location: Right Arm)   Pulse 90   Temp 98.4 F (36.9 C) (Oral)   Resp 18   Ht 6\' 2"  (1.88 m)   Wt (!) 147.9 kg (326 lb 1 oz)   SpO2 97%   BMI 41.86 kg/m  Constitutional: He appears well-developed. Obese. HENT: Normocephalicand atraumatic.  Eyes: EOMI. No discharge.  Cardiovascular: RRR. No JVD. Respiratory: Effort normal and breath sounds normal. No stridor.  GI: Soft. Bowel sounds are normal.  Musculoskeletal: No edema. No tenderness.  Neurological: He is alertand oriented. Motor: 5/5 throughout. No ataxia Skin: Skin is warmand dry.  Hematoma LLQ abdomen Psychiatric: He has a normal mood and affect. His behavior is normal. Thought contentnormal   Assessment/Plan: 1. 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 Physiatrist is providing close team supervision and 24 hour management of active medical problems listed  below. Physiatrist and rehab team continue to assess barriers to discharge/monitor patient progress toward functional and medical goals.  Function:  Bathing Bathing position   Position: Shower  Bathing parts Body parts bathed by patient: Right arm, Left arm, Chest, Abdomen, Front perineal area, Buttocks, Right upper leg, Left upper leg Body parts bathed by helper: Right lower leg, Left lower leg, Back  Bathing assist        Upper Body Dressing/Undressing Upper body dressing   What is the patient wearing?: Pull over shirt/dress     Pull over shirt/dress - Perfomed by patient: Thread/unthread right sleeve, Thread/unthread left sleeve, Put head through opening, Pull shirt over trunk          Upper body assist Assist Level: Set up      Lower Body Dressing/Undressing Lower body dressing   What is the patient wearing?: Shoes Underwear - Performed by patient: Thread/unthread right underwear leg, Thread/unthread left underwear leg, Pull underwear up/down   Pants- Performed by patient: Thread/unthread right pants leg, Pull pants up/down, Thread/unthread left pants leg   Non-skid slipper socks- Performed by patient: Don/doff right sock, Don/doff left sock Non-skid slipper socks- Performed by helper: Don/doff left sock     Shoes - Performed by patient: Don/doff right shoe, Don/doff left shoe, Fasten right, Fasten left Shoes - Performed by helper: Don/doff left shoe, Fasten left  Lower body assist Assist for lower body dressing: More than reasonable time   Set up : To obtain clothing/put away  Toileting Toileting   Toileting steps completed by patient: Adjust clothing prior to toileting, Performs perineal hygiene, Adjust clothing after toileting   Toileting Assistive Devices: Grab bar or rail  Toileting assist Assist level: More than reasonable time   Transfers Chair/bed transfer   Chair/bed transfer method: Ambulatory Chair/bed transfer assist level: Supervision  or verbal cues Chair/bed transfer assistive device: Armrests     Locomotion Ambulation     Max distance: 150 Assist level: Supervision or verbal cues   Wheelchair   Type: Manual Max wheelchair distance: 150 Assist Level: Supervision or verbal cues  Cognition Comprehension Comprehension assist level: Follows complex conversation/direction with no assist  Expression Expression assist level: Expresses complex ideas: With no assist  Social Interaction Social Interaction assist level: Interacts appropriately with others - No medications needed.  Problem Solving Problem solving assist level: Solves complex problems: Recognizes & self-corrects  Memory Memory assist level: More than reasonable amount of time    Medical Problem List and Plan: 1. 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   Cont CIR, plan for d/c tomorrow  Pt to follow up with Dr. Wynn Banker in 1-2 weeks for transitional care management 2. DVT Prophylaxis/Anticoagulation: Pharmaceutical: Lovenox 3. Pain Management: N/A 4. Mood: LCSW to follow for evaluation and support.  5. Neuropsych: This patient iscapable of making decisions on hisown behalf. 6. Skin/Wound Care: Routine pressure relief measures.  7. Fluids/Electrolytes/Nutrition: Monitor I/O.   Diet changed to D3, heart healthy, card modified per pt 8. HTN: Monitor BP bid  Lisinopril 5 started 2/12, increased on 2/13, d/ced on 2/14 due to ?side effects  Norvasc 2.5 started on 2/14, increased to 5 on 2/15  Improving, may need further increase 9. T2DM: Hgb A1C- 9.5. He reports that he has not been fully compliant with meds due to malaise for months. Monitor BS ac/hs.   Metformin increased to 1000 BID on 2/11, d/ced on 2/14  Lantus 10U started daily on 2/13, increased to 15U on 2/15  Will likely require further increase, will maintain today due to recent change  Use SSI for elevated BS.   Monitor with increased mobility 10. Dyslipidemia:  Now on Lipitor 11. Hyponatremia:   Na+ 135 on 2/16 12. Leucocytosis: Reports recent bout of PNA. No respiratory symptoms reported.   WBCs 13.4 on 2/16  Cont to monitor  Ua Neg, Ucx insignificant groth  CXR 2/13 reviewed, no infectious process 13 CKD?, more likely AKI: SCr-1.34 at admission.   Cr. 1.04 on 2/16 (at baseline)  Cont to monitor 14. Severe Nausea/Dizziness: Ordered vestibular evaluation.  Continue prn zofran for nausea, increased dose on 2/13  Scopalomine patch started 2/13  KUB unremarkable  Improving 15. Morbid obesity: Educated appropriate diet and on importance of weight loss to help promote overall health and function.  16. ?GERD  Pepcid started 2/13, changed to protonix on 2/14  LOS (Days) 7 A FACE TO FACE EVALUATION WAS PERFORMED  Ankit Karis Juba 01/09/2017 8:03 AM

## 2017-01-09 NOTE — Progress Notes (Signed)
Occupational Therapy Session Note  Patient Details  Name: Nicholas Mejia MRN: [id number] Date of Birth: 1971-06-09  Today's Date: 01/09/2017 OT Individual Time: 4098-11910830-0930 and 1345-1430 OT Individual Time Calculation (min): 60 min and 45 min   Short Term Goals: Week 1:  OT Short Term Goal 1 (Week 1): Pt will ambulate into bathroom for toilet transfer with Min A OT Short Term Goal 2 (Week 1): Pt will complete LB dressing with supervision  OT Short Term Goal 3 (Week 1): Pt will complete bathing with supervision and AE as needed OT Short Term Goal 4 (Week 1): Pt will ambulate in room to gather clothing items in prep for ADLs with Min A  Skilled Therapeutic Interventions/Progress Updates:    1) Completed ADL retraining at overall Mod I level.  Pt gathered all items prior to ambulating to ADL apt shower.  All mobility with distant supervision to Mod I with pt able to correct LOB to Lt with intermittent rests.  Completed tub/shower transfer by stepping over tub ledge with supervision.  Bathing completed with intermittent supervision at sit > stand level.  Pt reports slight increase in hot/cold sensation in Rt side and feeling that his Rt hand is finally warm to the touch.  Dressing completed at sit > stand level with pt led discussion of setup at home to carry over techniques in place during Rehab stay.  Discussed intermittent supervision from pt's mother as she plans to stay initially upon d/c and recommendation to have her in the home when he takes a shower, with pt reporting understanding.  Engaged in simple meal prep and housekeeping tasks in ADL apt with pt gathering all items from cabinets and refrigerator to [place] scrambled eggs.  Pt with no reports of increase in dizziness during activity, however reports increase in fatigue by end of session.  Engaged in wiping down counters and washing dishes post meal prep task, all at Mod I level.  Engaged in discussion regarding energy conservation strategies with  home making tasks as well as overall daily routine tasks.  2) Treatment session with focus on activity tolerance, functional mobility, and management of vestibular symptoms.  Pt ambulated to Dynavision at Mod I level with increased time due to pt self-selecting rest breaks to increase tolerance and balance.  Engaged in Dynavision for 2 min with pt average reaction time 1.32 and 0.91 seconds  Increased challenge to alternating attention between t-scope with grocery list and lights with pt completing 118/118 with average 1.01 seconds and pt correctly naming 19/19 words.  Discussed functional carryover of divided and alternating attention in home and community tasks as well as implications on vestibular system.  Engaged in zoom ball with focus on standing balance and visual tracking/convergence to attend to ball.  Returned to room while bouncing and catching ball with 1 rest break due to mild LOB, with pt able to correct without assistance.  Therapy Documentation Precautions:  Precautions Precautions: Fall Precaution Comments: monitor vitals, permissive HTN, DM, left LOBs Restrictions Weight Bearing Restrictions: No General:   Vital Signs: Therapy Vitals Pulse Rate: 99 Resp: 20 BP: (!) 144/73 Patient Position (if appropriate): Sitting Oxygen Therapy SpO2: 98 % O2 Device: Not Delivered Pain:  Pt with no c/o pain  See Function Navigator for Current Functional Status.   Therapy/Group: Individual Therapy  Rosalio LoudHOXIE, [place] M[place]utchen 01/09/2017, 8:43 AM

## 2017-01-09 NOTE — Progress Notes (Signed)
Physical Therapy Session Note  Patient Details  Name: Nicholas Mejia MRN: [id number] Date of Birth: [birth year]  Today's Date: 01/09/2017 PT Individual Time: 1515-1600   PT Individual Time Calculation: 45 min   Short Term Goals: Week 1:  PT Short Term Goal 1 (Week 1): Pt will perform bed mobility and bed <> chair transfers supervision PT Short Term Goal 2 (Week 1): Pt will ambulate x 150' with min A and reporting 50% less dizziness/nausea PT Short Term Goal 3 (Week 1): Pt will negotiate 12 stairs with R rail and min A PT Short Term Goal 4 (Week 1): Pt will perform vestibular and visual habituation exercises with supervision  Skilled Therapeutic Interventions/Progress Updates:   Pt received sitting EOB and agreeable to PT. PT treatment session focused on communiyt mobility and endurance. Gait training through controlled hospital environment 4 x 224f  And 5027fwith 2 standing rests breks with distant supervision assist and min cues for rest breaks as needs. Gait through community environments through gift shop x 15025fnd supervision assist due to foot drag x 2 stepping around obstacles. Gait training also over uneven cement sidewalk x  [id number] supervision assist with min cues for improved step height to prevent foot drag on the LLE. Pt returned to room and left sitting EOB with all needs met.       Therapy Documentation Precautions:  Precautions Precautions: Fall Precaution Comments: monitor vitals, permissive HTN, DM, left LOBs Restrictions Weight Bearing Restrictions: No Vital Signs: Therapy Vitals Temp: 98.4 F (36.9 C) Temp Source: Oral Pulse Rate: 90 Resp: 18 BP: (!) 149/73 Patient Position (if appropriate): Lying Oxygen Therapy SpO2: 97 % O2 Device: Not Delivered Pain: 0/10   See Function Navigator for Current Functional Status.   Therapy/Group: Individual Therapy  AustLorie Phenix6/2018, 6:30 AM

## 2017-01-09 NOTE — Progress Notes (Signed)
Occupational Therapy Discharge Summary  Patient Details  Name: Nicholas Mejia MRN: [id number] Date of Birth: 04/27/1971  Patient has met 12 of 12 long term goals due to improved activity tolerance, improved balance, postural control, ability to compensate for deficits, improved awareness and improved coordination.  Patient to discharge at overall Modified Independent level, supervision for shower transfers.  Patient's care partner is independent to provide the necessary intermittent assistance at discharge.  Mother plans to stay with pt to provide intermittent supervision initially upon d/c.  Reasons goals not met: NA  Recommendation:  Patient will benefit from ongoing skilled OT services in home health setting to continue to advance functional skills in the area of BADL, iADL, Vocation and Reduce care partner burden.  Equipment: shower seat  Reasons for discharge: treatment goals met and discharge from hospital  Patient/family agrees with progress made and goals achieved: Yes  OT Discharge Precautions/Restrictions  Precautions Precautions: Fall Precaution Comments: vestibular symptoms  Restrictions Weight Bearing Restrictions: No General   Vital Signs Therapy Vitals Temp: 98.1 F (36.7 C) Temp Source: Oral Pulse Rate: 90 Resp: 18 BP: (!) 147/66 Patient Position (if appropriate): Lying Oxygen Therapy SpO2: 98 % O2 Device: Not Delivered Pain Pain Assessment Pain Assessment: No/denies pain ADL ADL ADL Comments: Please see functional navigator for ADL status Vision/Perception  Vision- History Baseline Vision/History: Wears glasses Wears Glasses: At all times Patient Visual Report: Diplopia (pt reports improving diplopia ) Vision- Assessment Vision Assessment?: Yes Eye Alignment: Impaired (comment) (Lt eye with deviation from midline out to Lt) Ocular Range of Motion: Within Functional Limits Tracking/Visual Pursuits: Decreased smoothness of vertical  tracking Convergence: Impaired - to be further tested in functional context Visual Fields: No apparent deficits Diplopia Assessment: Other (comment) (only during rigorous exercise) Perception Comments: WFL  Cognition Overall Cognitive Status: Within Functional Limits for tasks assessed Arousal/Alertness: Awake/alert Orientation Level: Oriented X4 Attention: Selective Focused Attention: Appears intact Sustained Attention: Appears intact Selective Attention: Appears intact Awareness: Appears intact Problem Solving: Appears intact Safety/Judgment: Appears intact Sensation Sensation Light Touch: Impaired Detail Light Touch Impaired Details: Impaired RLE Stereognosis: Appears Intact Hot/Cold: Impaired Detail Hot/Cold Impaired Details: Absent RUE;Absent RLE Proprioception: Appears Intact Coordination Gross Motor Movements are Fluid and Coordinated: Yes Fine Motor Movements are Fluid and Coordinated: Yes Finger Nose Finger Test: WFL Motor  Motor Motor: Ataxia Motor - Discharge Observations: ongoing ataxia with fatigue, but improving from time of eval Mobility  Bed Mobility Bed Mobility: Supine to Sit;Sit to Supine Supine to Sit: 7: Independent Sit to Supine: 7: Independent  Trunk/Postural Assessment  Cervical Assessment Cervical Assessment: Within Functional Limits Thoracic Assessment Thoracic Assessment: Within Functional Limits Lumbar Assessment Lumbar Assessment: Within Functional Limits Postural Control Postural Control: Deficits on evaluation Protective Responses: slightly delayed but improving  Balance Balance Balance Assessed: Yes Standardized Balance Assessment Standardized Balance Assessment: Berg Balance Test Berg Balance Test Sit to Stand: Able to stand without using hands and stabilize independently Standing Unsupported: Able to stand safely 2 minutes Sitting with Back Unsupported but Feet Supported on Floor or Stool: Able to sit safely and securely 2  minutes Stand to Sit: Sits safely with minimal use of hands Transfers: Able to transfer safely, definite need of hands Standing Unsupported with Eyes Closed: Able to stand 10 seconds safely Standing Ubsupported with Feet Together: Able to place feet together independently and stand 1 minute safely From Standing, Reach Forward with Outstretched Arm: Can reach confidently >25 cm (10") From Standing Position, Pick up Object from Floor: Able to  pick up shoe safely and easily From Standing Position, Turn to Look Behind Over each Shoulder: Turn sideways only but maintains balance Turn 360 Degrees: Able to turn 360 degrees safely but slowly Standing Unsupported, Alternately Place Feet on Step/Stool: Able to stand independently and complete 8 steps >20 seconds Standing Unsupported, One Foot in Front: Able to take small step independently and hold 30 seconds Standing on One Leg: Able to lift leg independently and hold 5-10 seconds Total Score: 47 Static Sitting Balance Static Sitting - Balance Support: Feet supported Static Sitting - Level of Assistance: 6: Modified independent (Device/Increase time);7: Independent Dynamic Sitting Balance Dynamic Sitting - Balance Support: Feet supported Dynamic Sitting - Level of Assistance: 6: Modified independent (Device/Increase time) Static Standing Balance Static Standing - Balance Support: Right upper extremity supported;Left upper extremity supported Static Standing - Level of Assistance: 6: Modified independent (Device/Increase time) Dynamic Standing Balance Dynamic Standing - Balance Support: Right upper extremity supported;Left upper extremity supported Dynamic Standing - Level of Assistance: 6: Modified independent (Device/Increase time) Extremity/Trunk Assessment RUE Assessment RUE Assessment: Within Functional Limits LUE Assessment LUE Assessment: Within Functional Limits (mild ataxia and loose gross grasp)   See Function Navigator for Current  Functional Status.  Simonne Come 01/09/2017, 3:33 PM

## 2017-01-09 NOTE — Discharge Instructions (Signed)
Inpatient Rehab Discharge Instructions  Nicholas Mejia Discharge date and time:  01/09/17  Activities/Precautions/ Functional Status: Activity: no lifting, driving, or strenuous exercise for till cleared by MD.  Diet: cardiac diet and diabetic diet Wound Care: none needed   Functional status:  ___ No restrictions     ___ Walk up steps independently ___ 24/7 supervision/assistance   ___ Walk up steps with assistance _X__ Intermittent supervision/assistance  _X__ Bathe/dress independently ___ Walk with walker     ___ Bathe/dress with assistance ___ Walk Independently    ___ Shower independently ___ Walk with assistance    ___ Shower with assistance _X__ No alcohol     ___ Return to work/school ________   Special Instructions: 1. Take your medications as prescribed.  2. No driving till cleared by Dr.Kirsteins.  3. Check blood sugars 2-3 times a day before meals and/or at bedtime.    COMMUNITY REFERRALS UPON DISCHARGE:    Home Health:   PT, OT,RN  Agency:[facility]   Phone:[phone]   Date of last service:01/10/2017  Medical Equipment/Items Ordered:SHOWER SEAT  Agency/Supplier:[facility]   [phone]   GENERAL COMMUNITY RESOURCES FOR PATIENT/FAMILY: Support Groups:[facility] SUPPORT GROUP EVERY SECOND Thursday @ 3:00-4:00 PM ON THE REHAB UNIT QUESTIONS CONTACT CAITLYN  [phone]  STROKE/TIA DISCHARGE INSTRUCTIONS SMOKING Cigarette smoking nearly doubles your risk of having a stroke & is the single most alterable risk factor  If you smoke or have smoked in the last 12 months, you are advised to quit smoking for your health.  Most of the excess cardiovascular risk related to smoking disappears within a year of stopping.  Ask you doctor about anti-smoking medications  [place] Quit Line: 1-800-QUIT NOW  Free Smoking Cessation Classes [phone]  CHOLESTEROL Know your levels; limit fat & cholesterol in your diet  Lipid Panel     Component Value Date/Time   CHOL  234 (H) 01/01/2017 0220   TRIG 166 (H) 01/01/2017 0220   HDL 33 (L) 01/01/2017 0220   CHOLHDL 7.1 01/01/2017 0220   VLDL 33 01/01/2017 0220   LDLCALC 168 (H) 01/01/2017 0220      Many patients benefit from treatment even if their cholesterol is at goal.  Goal: Total Cholesterol (CHOL) less than 160  Goal:  Triglycerides (TRIG) less than 150  Goal:  HDL greater than 40  Goal:  LDL (LDLCALC) less than 100   BLOOD PRESSURE American Stroke Association blood pressure target is less that 120/80 mm/Hg  Your discharge blood pressure is:  BP: (!) 168/97  Monitor your blood pressure  Limit your salt and alcohol intake  Many individuals will require more than one medication for high blood pressure  DIABETES (A1c is a blood sugar average for last 3 months) Goal HGBA1c is under 7% (HBGA1c is blood sugar average for last 3 months)  Diabetes:     Lab Results  Component Value Date   HGBA1C 9.5 (H) 01/01/2017     Your HGBA1c can be lowered with medications, healthy diet, and exercise.  Check your blood sugar as directed by your physician  Call your physician if you experience unexplained or low blood sugars.  PHYSICAL ACTIVITY/REHABILITATION Goal is 30 minutes at least 4 days per week  Activity: No driving, Therapies: See above Return to work: to be decided on follow up  Activity decreases your risk of heart attack and stroke and makes your heart stronger.  It helps control your weight and blood pressure; helps you relax and can improve your  mood.  Participate in a regular exercise program.  Talk with your doctor about the best form of exercise for you (dancing, walking, swimming, cycling).  DIET/WEIGHT Goal is to maintain a healthy weight  Your discharge diet is: Diet heart healthy/carb modified Room service appropriate? Yes; Fluid consistency: Thin  liquids Your height is:  Height: 6\' 2"  (188 cm) Your current weight is:   Your Body Mass Index (BMI) is:     Following the type of  diet specifically designed for you will help prevent another stroke.  Your goal weight is:   Your goal Body Mass Index (BMI) is 19-24.  Healthy food habits can help reduce 3 risk factors for stroke:  High cholesterol, hypertension, and excess weight.  RESOURCES Stroke/Support Group:  Call [phone]   STROKE EDUCATION PROVIDED/REVIEWED AND GIVEN TO PATIENT Stroke warning signs and symptoms How to activate emergency medical system (call 911). Medications prescribed at discharge. Need for follow-up after discharge. Personal risk factors for stroke. Pneumonia vaccine given:  Flu vaccine given:  My questions have been answered, the writing is legible, and I understand these instructions.  I will adhere to these goals & educational materials that have been provided to me after my discharge from the hospital.     My questions have been answered and I understand these instructions. I will adhere to these goals and the provided educational materials after my discharge from the hospital.  Patient/Caregiver Signature _______________________________ Date __________  Clinician Signature _______________________________________ Date __________  Please bring this form and your medication list with you to all your follow-up doctor's appointments.

## 2017-01-09 NOTE — Progress Notes (Signed)
Physical Therapy Session Note  Patient Details  Name: Nicholas Mejia MRN: [id number] Date of Birth: 09-Jan-1971  Today's Date: 01/09/2017 PT Individual Time: 1300-1330 PT Individual Time Calculation (min): 30 min   Short Term Goals: Week 1:  PT Short Term Goal 1 (Week 1): Pt will perform bed mobility and bed <> chair transfers supervision PT Short Term Goal 2 (Week 1): Pt will ambulate x 150' with min A and reporting 50% less dizziness/nausea PT Short Term Goal 3 (Week 1): Pt will negotiate 12 stairs with R rail and min A PT Short Term Goal 4 (Week 1): Pt will perform vestibular and visual habituation exercises with supervision  Skilled Therapeutic Interventions/Progress Updates:    no c/o pain, reports feeling better every day.  Session focus on stair negotiation to simulate home entry and balance assessment.  Pt ambulates throughout unit mod I.  Stair negotiation for full flight of stairs with R ascending rail to simulate home entry with supervision.  Pt uses BUE support on R rail and alternating pattern.  PT administered Berg Balance Scale and patient demonstrates moderate fall risk as noted by score of 47/56 on Berg Balance Scale.  Pt interpreted results for pt and pt verbalized understanding.  Returned to room at end of session and positioned with call bell in reach and needs met.    Therapy Documentation Precautions:  Precautions Precautions: Fall Precaution Comments: monitor vitals, permissive HTN, DM, left LOBs Restrictions Weight Bearing Restrictions: No  Balance: Standardized Balance Assessment Standardized Balance Assessment: Berg Balance Test Berg Balance Test Sit to Stand: Able to stand without using hands and stabilize independently Standing Unsupported: Able to stand safely 2 minutes Sitting with Back Unsupported but Feet Supported on Floor or Stool: Able to sit safely and securely 2 minutes Stand to Sit: Sits safely with minimal use of hands Transfers: Able to transfer  safely, definite need of hands Standing Unsupported with Eyes Closed: Able to stand 10 seconds safely Standing Ubsupported with Feet Together: Able to place feet together independently and stand 1 minute safely From Standing, Reach Forward with Outstretched Arm: Can reach confidently >25 cm (10") From Standing Position, Pick up Object from Floor: Able to pick up shoe safely and easily From Standing Position, Turn to Look Behind Over each Shoulder: Turn sideways only but maintains balance Turn 360 Degrees: Able to turn 360 degrees safely but slowly Standing Unsupported, Alternately Place Feet on Step/Stool: Able to stand independently and complete 8 steps >20 seconds Standing Unsupported, One Foot in Front: Able to take small step independently and hold 30 seconds Standing on One Leg: Able to lift leg independently and hold 5-10 seconds Total Score: 47   See Function Navigator for Current Functional Status.   Therapy/Group: Individual Therapy  Nicholas Mejia 01/09/2017, 1:31 PM

## 2017-01-09 NOTE — Progress Notes (Signed)
Social Work  Discharge Note  The overall goal for the admission was met for:   Discharge location: Yes-HOME ALONE-MOM TO COME TO FOR A [place]  Length of Stay: Yes-8 DAYS  Discharge activity level: Yes-MOD/I LEVEL  Home/community participation: Yes  Services provided included: MD, RD, PT, OT, RN, CM, TR, Pharmacy, Neuropsych and SW  Financial Services: Private Insurance: BCBS OF [place]  Follow-up services arranged: Home Health: [facility], DME: [facility] and Patient/Family has no preference for HH/DME agencies  Comments (or additional information):MOM TO COME AND STAY FOR A FEW DAYS TO [place]. PT IS MOD/I LEVEL. NEEDS TO DO EXERCISES AND BE COMPLIANT WITH HIS MEDICATIONS.  Patient/Family verbalized understanding of follow-up arrangements: Yes  Individual responsible for coordination of the follow-up plan: SELF & MARIE-MOM  Confirmed correct DME delivered: Elease Hashimoto 01/09/2017    Elease Hashimoto

## 2017-01-09 NOTE — Progress Notes (Signed)
Physical Therapy Discharge Summary  Patient Details  Name: Nicholas Mejia MRN: [id number] Date of Birth: [birth year]  Today's Date: 01/09/2017 PT Individual Time: 6286-3817 PT Individual Time Calculation (min): 55 min    Patient has met 10 of 10 long term goals due to improved activity tolerance, improved balance, improved postural control, increased strength, increased range of motion, ability to compensate for deficits, functional use of  left upper extremity, improved attention, improved awareness and improved coordination.  Patient to discharge at an ambulatory level Modified Independent.     Recommendation:  Patient will benefit from ongoing skilled PT services in home health setting to continue to advance safe functional mobility, address ongoing impairments in balance, coordination, perception, and safety awareness, and minimize fall risk.  Equipment: No equipment provided  Reasons for discharge: treatment goals met  Patient/family agrees with progress made and goals achieved: Yes   Skilled Therapeutic Intervention: No c/o pain.  Session focus on reassessment of balance, coordination, strength, sensation, and functional mobility.  Pt currently performing all mobility with mod I, supervision on flight of stairs with 1 rail for safety.  Discussed maintaining slow pace and allowing extra time for activities.  PT instructed pt in falls recovery which pt returned demo mod I.  nustep x14 minutes with BLEs/BUEs focus on maintaining effort level 7/10 for reciprocal stepping pattern retraining and cardiovascular endurance.  Also discussed stroke support group and encouraged pt to attend, if able; pt open to idea.  Pt returned to room at end of session and made mod I in room.    PT Discharge Precautions/Restrictions Precautions Precautions: Fall Precaution Comments: vestibular symptoms  Restrictions Weight Bearing Restrictions: No Pain Pain Assessment Pain Assessment: No/denies  pain Vision/Perception  Perception Comments: WFL  Cognition Overall Cognitive Status: Within Functional Limits for tasks assessed Arousal/Alertness: Awake/alert Orientation Level: Oriented X4 Attention: Selective Focused Attention: Appears intact Sustained Attention: Appears intact Selective Attention: Appears intact Awareness: Appears intact Problem Solving: Appears intact Safety/Judgment: Appears intact Sensation Sensation Light Touch: Impaired Detail Light Touch Impaired Details: Impaired RLE Hot/Cold: Impaired by gross assessment Hot/Cold Impaired Details: Absent RUE;Absent RLE Proprioception: Appears Intact Coordination Gross Motor Movements are Fluid and Coordinated: Yes Fine Motor Movements are Fluid and Coordinated: Yes Motor  Motor Motor: Ataxia Motor - Discharge Observations: ongoing ataxia with fatigue, but improving from time of eval  Mobility Bed Mobility Bed Mobility: Supine to Sit;Sit to Supine Supine to Sit: 7: Independent Sit to Supine: 7: Independent Transfers Transfers: Yes Stand Pivot Transfers: 6: Modified independent (Device/Increase time) Locomotion  Ambulation Ambulation: Yes Ambulation/Gait Assistance: 5: Supervision Ambulation Distance (Feet): 200 Feet Assistive device: None Ambulation/Gait Assistance Details: occasional LOB with turning but pt able to self correct Gait Gait: Yes Gait Pattern: Impaired Stairs / Additional Locomotion Stairs: Yes Stairs Assistance: 5: Supervision Stair Management Technique: One rail Right;Alternating pattern;Forwards Number of Stairs: 12 Height of Stairs: 6 Ramp: 6: Modified independent (Device) Curb: 6: Modified independent (Device/increase time) Wheelchair Mobility Wheelchair Mobility: No  Trunk/Postural Assessment  Cervical Assessment Cervical Assessment: Within Functional Limits Thoracic Assessment Thoracic Assessment: Within Functional Limits Lumbar Assessment Lumbar Assessment: Within  Functional Limits Postural Control Postural Control: Deficits on evaluation Protective Responses: slightly delayed but improving  Balance Balance Balance Assessed: Yes Standardized Balance Assessment Standardized Balance Assessment: Berg Balance Test Berg Balance Test Sit to Stand: Able to stand without using hands and stabilize independently Standing Unsupported: Able to stand safely 2 minutes Sitting with Back Unsupported but Feet Supported on Floor or Stool: Able to  sit safely and securely 2 minutes Stand to Sit: Sits safely with minimal use of hands Transfers: Able to transfer safely, definite need of hands Standing Unsupported with Eyes Closed: Able to stand 10 seconds safely Standing Ubsupported with Feet Together: Able to place feet together independently and stand 1 minute safely From Standing, Reach Forward with Outstretched Arm: Can reach confidently >25 cm (10") From Standing Position, Pick up Object from Floor: Able to pick up shoe safely and easily From Standing Position, Turn to Look Behind Over each Shoulder: Turn sideways only but maintains balance Turn 360 Degrees: Able to turn 360 degrees safely but slowly Standing Unsupported, Alternately Place Feet on Step/Stool: Able to stand independently and complete 8 steps >20 seconds Standing Unsupported, One Foot in Front: Able to take small step independently and hold 30 seconds Standing on One Leg: Able to lift leg independently and hold 5-10 seconds Total Score: 47 Static Sitting Balance Static Sitting - Balance Support: Feet supported Static Sitting - Level of Assistance: 6: Modified independent (Device/Increase time);7: Independent Dynamic Sitting Balance Dynamic Sitting - Balance Support: Feet supported Dynamic Sitting - Level of Assistance: 6: Modified independent (Device/Increase time) Static Standing Balance Static Standing - Balance Support: Right upper extremity supported;Left upper extremity supported Static  Standing - Level of Assistance: 6: Modified independent (Device/Increase time) Dynamic Standing Balance Dynamic Standing - Balance Support: Right upper extremity supported;Left upper extremity supported Dynamic Standing - Level of Assistance: 6: Modified independent (Device/Increase time) Extremity Assessment      RLE Assessment RLE Assessment: Within Functional Limits LLE Assessment LLE Assessment: Within Functional Limits   See Function Navigator for Current Functional Status.  Akiera Allbaugh E Penven-Crew 01/09/2017, 2:58 PM

## 2017-01-09 NOTE — Progress Notes (Signed)
Patient refused Lovenox r/t bruises (purple) on abd and he noted that he will be discharge tomorrow.

## 2017-01-10 LAB — GLUCOSE, CAPILLARY
GLUCOSE-CAPILLARY: 199 mg/dL — AB (ref 65–99)
Glucose-Capillary: 150 mg/dL — ABNORMAL HIGH (ref 65–99)

## 2017-01-10 NOTE — Progress Notes (Signed)
PHYSICAL MEDICINE & REHABILITATION     PROGRESS NOTE  Subjective/Complaints:  No complaints. Had questions about FMLA paperwork. Ready to go home  ROS: pt denies nausea, vomiting, diarrhea, cough, shortness of breath or chest pain  Objective: Vital Signs: Blood pressure (!) 166/81, pulse 85, temperature 98.6 F (37 C), temperature source Oral, resp. rate 17, height 6\' 2"  (1.88 m), weight (!) 149.1 kg (328 lb 9.6 oz), SpO2 97 %. No results found.  Recent Labs  01/08/17 0617 01/09/17 0419  WBC 13.3* 13.4*  HGB 14.0 13.6  HCT 43.8 41.7  PLT 262 251    Recent Labs  01/09/17 0419  NA 135  K 3.7  CL 95*  GLUCOSE 183*  BUN 7  CREATININE 1.04  CALCIUM 8.4*   CBG (last 3)   Recent Labs  01/09/17 1641 01/09/17 2043 01/10/17 0706  GLUCAP 188* 229* 150*    Wt Readings from Last 3 Encounters:  01/09/17 (!) 149.1 kg (328 lb 9.6 oz)  12/31/16 (!) 147.9 kg (326 lb)    Physical Exam:  BP (!) 166/81 (BP Location: Left Arm)   Pulse 85   Temp 98.6 F (37 C) (Oral)   Resp 17   Ht 6\' 2"  (1.88 m)   Wt (!) 149.1 kg (328 lb 9.6 oz)   SpO2 97%   BMI 42.19 kg/m  Constitutional: He appears well-developed. Obese. HENT: Normocephalicand atraumatic.  Eyes: EOMI. No discharge.  Cardiovascular: RRR. Respiratory: Effort normal and breath sounds normal. No stridor.  GI: Soft. Bowel sounds are normal.  Musculoskeletal: No edema. No tenderness.  Neurological: He is alertand oriented. Motor: 5/5 throughout. No ataxia Skin: Skin is warmand dry.  Hematoma LLQ abdomen Psychiatric: He has a normal mood and affect. His behavior is normal. Thought contentnormal   Assessment/Plan: 1. 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 Physiatrist is providing close team supervision and 24 hour management of active medical problems listed below. Physiatrist and rehab  team continue to assess barriers to discharge/monitor patient progress toward functional and medical goals.  Function:  Bathing Bathing position   Position: Shower  Bathing parts Body parts bathed by patient: Right arm, Left arm, Chest, Abdomen, Front perineal area, Buttocks, Right upper leg, Left upper leg, Right lower leg, Left lower leg, Back Body parts bathed by helper: Right lower leg, Left lower leg, Back  Bathing assist Assist Level: Set up      Upper Body Dressing/Undressing Upper body dressing   What is the patient wearing?: Pull over shirt/dress     Pull over shirt/dress - Perfomed by patient: Thread/unthread right sleeve, Thread/unthread left sleeve, Put head through opening, Pull shirt over trunk          Upper body assist Assist Level: No help, No cues      Lower Body Dressing/Undressing Lower body dressing   What is the patient wearing?: Underwear, Pants, Socks, Shoes Underwear - Performed by patient: Thread/unthread right underwear leg, Thread/unthread left underwear leg, Pull underwear up/down   Pants- Performed by patient: Thread/unthread right pants leg, Pull pants up/down, Thread/unthread left pants leg   Non-skid slipper socks- Performed by patient: Don/doff right sock, Don/doff left sock Non-skid slipper socks- Performed by helper: Don/doff left sock Socks - Performed by patient: Don/doff right sock, Don/doff left sock   Shoes - Performed by patient: Don/doff right shoe, Don/doff left shoe, Fasten right, Fasten left Shoes - Performed by  helper: Don/doff left shoe, Fasten left          Lower body assist Assist for lower body dressing: More than reasonable time   Set up : To obtain clothing/put away  Toileting Toileting   Toileting steps completed by patient: Adjust clothing prior to toileting, Performs perineal hygiene, Adjust clothing after toileting   Toileting Assistive Devices: Grab bar or rail  Toileting assist Assist level: More than  reasonable time   Transfers Chair/bed transfer   Chair/bed transfer method: Ambulatory Chair/bed transfer assist level: Supervision or verbal cues Chair/bed transfer assistive device: Armrests     Locomotion Ambulation     Max distance: 150 Assist level: Supervision or verbal cues   Wheelchair   Type: Manual Max wheelchair distance: 150 Assist Level: Supervision or verbal cues  Cognition Comprehension Comprehension assist level: Follows complex conversation/direction with no assist  Expression Expression assist level: Expresses complex ideas: With no assist  Social Interaction Social Interaction assist level: Interacts appropriately with others - No medications needed.  Problem Solving Problem solving assist level: Solves complex problems: Recognizes & self-corrects  Memory Memory assist level: More than reasonable amount of time    Medical Problem List and Plan: 1. 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   Dc home today  Pt to follow up with Dr. Wynn BankerKirsteins in 1-2 weeks for transitional care management 2. DVT Prophylaxis/Anticoagulation: Pharmaceutical: Lovenox 3. Pain Management: N/A 4. Mood: LCSW to follow for evaluation and support.  5. Neuropsych: This patient iscapable of making decisions on hisown behalf. 6. Skin/Wound Care: Routine pressure relief measures.  7. Fluids/Electrolytes/Nutrition: Monitor I/O.   Diet changed to D3, heart healthy, card modified per pt 8. HTN: Monitor BP bid  Lisinopril 5 started 2/12, increased on 2/13, d/ced on 2/14 due to ?side effects  Norvasc 2.5 started on 2/14, increased to 5 on 2/15   9. T2DM: Hgb A1C- 9.5. He reports that he has not been fully compliant with meds due to malaise for months. Monitor BS ac/hs.   Metformin increased to 1000 BID on 2/11, d/ced on 2/14  Lantus 10U started daily on 2/13, increased to 15U on 2/15  Will likely require further increase, will maintain today due to recent  change  Use SSI for elevated BS.  10. Dyslipidemia: Now on Lipitor 11. Hyponatremia:   Na+ 135 on 2/16 12. Leucocytosis: Reports recent bout of PNA. No respiratory symptoms reported.   WBCs 13.4 on 2/16  Cont to monitor  Ua Neg, Ucx insignificant groth  CXR 2/13 reviewed, no infectious process 13 CKD?, more likely AKI: SCr-1.34 at admission.   Cr. 1.04 on 2/16 (at baseline)  Cont to monitor 14. Severe Nausea/Dizziness: Ordered vestibular evaluation.  Continue prn zofran for nausea, increased dose on 2/13  Scopalomine patch started 2/13  KUB unremarkable  Improving 15. Morbid obesity: Educated appropriate diet and on importance of weight loss to help promote overall health and function.  16. ?GERD  Pepcid started 2/13, changed to protonix on 2/14  LOS (Days) 8 A FACE TO FACE EVALUATION WAS PERFORMED  SWARTZ,ZACHARY T 01/10/2017 8:10 AM

## 2017-01-21 ENCOUNTER — Encounter: Payer: Self-pay | Admitting: Neurology

## 2017-01-21 ENCOUNTER — Ambulatory Visit (INDEPENDENT_AMBULATORY_CARE_PROVIDER_SITE_OTHER): Payer: BLUE CROSS/BLUE SHIELD | Admitting: Neurology

## 2017-01-21 VITALS — BP 150/60 | HR 95 | Ht 74.0 in | Wt 326.2 lb

## 2017-01-21 DIAGNOSIS — G463 Brain stem stroke syndrome: Secondary | ICD-10-CM | POA: Diagnosis not present

## 2017-01-21 DIAGNOSIS — G464 Cerebellar stroke syndrome: Secondary | ICD-10-CM

## 2017-01-21 NOTE — Patient Instructions (Signed)
I had a long d/w patient and his friend Nicholas Mejia about his recent lateral medullary stroke stroke, risk for recurrent stroke/TIAs, personally independently reviewed imaging studies and stroke evaluation results and answered questions.Continue aspirin 81 mg daily and clopidogrel 75 mg daily  For 2 more months and then stop aspirin and continue clopidogrel alone for secondary stroke prevention and maintain strict control of hypertension with blood pressure goal below 130/90, diabetes with hemoglobin A1c goal below 6.5% and lipids with LDL cholesterol goal below 70 mg/dL. I also advised the patient to eat a healthy diet with plenty of whole grains, cereals, fruits and vegetables, exercise regularly and maintain ideal body weight Followup in the future with my NP in 3 months or call earlier Stroke Prevention Some medical conditions and behaviors are associated with an increased chance of having a stroke. You may prevent a stroke by making healthy choices and managing medical conditions. How can I reduce my risk of having a stroke?  Stay physically active. Get at least 30 minutes of activity on most or all days.  Do not smoke. It may also be helpful to avoid exposure to secondhand smoke.  Limit alcohol use. Moderate alcohol use is considered to be:  No more than 2 drinks per day for men.  No more than 1 drink per day for nonpregnant women.  Eat healthy foods. This involves:  Eating 5 or more servings of fruits and vegetables a day.  Making dietary changes that address high blood pressure (hypertension), high cholesterol, diabetes, or obesity.  Manage your cholesterol levels.  Making food choices that are high in fiber and low in saturated fat, trans fat, and cholesterol may control cholesterol levels.  Take any prescribed medicines to control cholesterol as directed by your health care provider.  Manage your diabetes.  Controlling your carbohydrate and sugar intake is recommended to manage  diabetes.  Take any prescribed medicines to control diabetes as directed by your health care provider.  Control your hypertension.  Making food choices that are low in salt (sodium), saturated fat, trans fat, and cholesterol is recommended to manage hypertension.  Ask your health care provider if you need treatment to lower your blood pressure. Take any prescribed medicines to control hypertension as directed by your health care provider.  If you are 3918-48 years of age, have your blood pressure checked every 3-5 years. If you are 48 years of age or older, have your blood pressure checked every year.  Maintain a healthy weight.  Reducing calorie intake and making food choices that are low in sodium, saturated fat, trans fat, and cholesterol are recommended to manage weight.  Stop drug abuse.  Avoid taking birth control pills.  Talk to your health care provider about the risks of taking birth control pills if you are over 48 years old, smoke, get migraines, or have ever had a blood clot.  Get evaluated for sleep disorders (sleep apnea).  Talk to your health care provider about getting a sleep evaluation if you snore a lot or have excessive sleepiness.  Take medicines only as directed by your health care provider.  For some people, aspirin or blood thinners (anticoagulants) are helpful in reducing the risk of forming abnormal blood clots that can lead to stroke. If you have the irregular heart rhythm of atrial fibrillation, you should be on a blood thinner unless there is a good reason you cannot take them.  Understand all your medicine instructions.  Make sure that other conditions (such as anemia  or atherosclerosis) are addressed. Get help right away if:  You have sudden weakness or numbness of the face, arm, or leg, especially on one side of the body.  Your face or eyelid droops to one side.  You have sudden confusion.  You have trouble speaking (aphasia) or  understanding.  You have sudden trouble seeing in one or both eyes.  You have sudden trouble walking.  You have dizziness.  You have a loss of balance or coordination.  You have a sudden, severe headache with no known cause.  You have new chest pain or an irregular heartbeat. Any of these symptoms may represent a serious problem that is an emergency. Do not wait to see if the symptoms will go away. Get medical help at once. Call your local emergency services (911 in [place] Do not drive yourself to the hospital. This information is not intended to replace advice given to you by your health care provider. Make sure you discuss any questions you have with your health care provider. Document Released: 12/18/2004 Document Revised: 04/17/2016 Document Reviewed: 05/13/2013 Elsevier Interactive Patient Education  2017 [employer].

## 2017-01-21 NOTE — Progress Notes (Signed)
[facility] [address] [place]. [place] [address] [phone]       OFFICE FOLLOW-UP NOTE  Mr. Deaundre Allston Date of Birth:  June 07, 1971 Medical Record Number:  [id number]   HPI: Mr Figueira is 70 year African-American male seen today for first office follow-up visit following hospital admission for stroke and fibula 2018. He is a ccompanied by his friend Barnetta Chapel. Keelen Cookis an 46 y.o.malewho presented to the [facility] ED with c/c of left sided facial numbness beginning about 11 AM on 2/5 (LKW). He also had a headache involving his left periorbital and retro-orbital region. He was seen for this, receiving Toradol and an unknown BP medication. Soon thereafter he began experiencing numbness of the left side of his face. He went home, slept, and on awakening he experienced severe gait instability and left sided incoordination. Also felt as though he was "drifting to the left" while ambulating. He states that he has slight difficulty swallowing with a sensation that swallowed material does not move as well in the left side of his throat relative to the right when he swallows. He recently finished a course of antibiotic for pneumonia. He has a PMHx of DM. He denies prior stroke or MI. Patient was not administered IV t-PA secondary to delay in arrival. Hewas admitted to the for further evaluation and treatment. MRI scan of the brain showed left lateral medullary infarct involving posterior lateral medulla on the left. LDL cholesterol is elevated at 168 mg percent. Hemoglobin A1c was 9.5 CT angiogram of the neck showed 70% stenosis at the origin of the left vertebral and 50% origin stenosis on the right with calcification. There was severe atherosclerotic changes involving both carotid siphons.Patient was seen by physical occupational therapy and was felt safe to go home but outpatient therapy. He states his done well his balance is improving though he still has persistent numbness in his face  and right side of the body. He feels very tired and he is not been able to work. His primary physician and started on amlodipine but it made him feel sick and Dr. blood pressure too low. He has to stop it. His blood pressure is currently running in the 950-932 systolic range. His complains of severe constipation and plans were discussed this with his primary physician. His fasting sugars are yet elevated and range in the 176. Patient states his balance is poor however his been careful with his walking and not had any falls. His tolerate aspirin and Plavix well with minor bruising but no bleeding episodes. He is tolerating Lipitor well without muscle aches or pains.  ROS:   14 system review of systems is positive for fatigue, facial swelling, ear pain, ringing in the ears, runny nose, trouble swallowing, eye discharge, redness, double vision, eye pain, leg swelling, cold intolerance, flushing, constipation, nausea, insomnia, frequent waking, snoring, sleep talking, allergies, walking difficulty, headache, numbness, weakness, decreased concentration, anxiety and depression and all other systems negative  PMH:  Past Medical History:  Diagnosis Date  . Diabetes mellitus without complication ([place])   . Hypertension   . Stroke [facility])     Social History:  Social History   Social History  . Marital status: Single    Spouse name: N/A  . Number of children: N/A  . Years of education: N/A   Occupational History  . Not on file.   Social History Main Topics  . Smoking status: Former Research scientist (life sciences)  . Smokeless tobacco: Never Used  . Alcohol use Yes  Comment: seldom  . Drug use: No  . Sexual activity: Not on file   Other Topics Concern  . Not on file   Social History Narrative  . No narrative on file    Medications:   Current Outpatient Prescriptions on File Prior to Visit  Medication Sig Dispense Refill  . amLODipine (NORVASC) 5 MG tablet Take 1 tablet (5 mg total) by mouth daily. 30 tablet 0    . aspirin EC 81 MG tablet Take 162 mg by mouth daily.    [name] atorvastatin (LIPITOR) 40 MG tablet Take 1 tablet (40 mg total) by mouth daily at 6 PM. 30 tablet 0  . blood glucose meter kit and supplies KIT Dispense based on patient and insurance preference. Use up to four times daily as directed. (FOR ICD-9 250.00, 250.01). 1 each 0  . clopidogrel (PLAVIX) 75 MG tablet Take 1 tablet (75 mg total) by mouth daily. 30 tablet 0  . insulin glargine (LANTUS) 100 UNIT/ML injection Inject 0.15 mLs (15 Units total) into the skin daily. 10 mL 0  . Insulin Pen Needle 31G X 8 MM MISC 1 applicator by Does not apply route daily with breakfast. 100 each 0  . ondansetron (ZOFRAN) 4 MG tablet Take 1 tablet (4 mg total) by mouth every 8 (eight) hours as needed for nausea or vomiting. 90 tablet 0  . pantoprazole (PROTONIX) 40 MG tablet Take 1 tablet (40 mg total) by mouth 2 (two) times daily. 60 tablet 0  . scopolamine (TRANSDERM-SCOP) 1 MG/3DAYS Place 1 patch (1.5 mg total) onto the skin every 3 (three) days. 10 patch 12  . senna-docusate (SENOKOT-S) 8.6-50 MG tablet Take 2 tablets by mouth at bedtime. 100 tablet 0   No current facility-administered medications on file prior to visit.     Allergies:   Allergies  Allergen Reactions  . Bee Venom Anaphylaxis and Swelling  . Penicillins     Swelling in feet  . Pollen Extract     Physical Exam General: Obese middle-aged African-American male seated, in no evident distress Head: head normocephalic and atraumatic.  Neck: supple with no carotid or supraclavicular bruits Cardiovascular: regular rate and rhythm, no murmurs Musculoskeletal: no deformity Skin:  no rash/petichiae Vascular:  Normal pulses all extremities Vitals:   01/21/17 1006  BP: (!) 150/60  Pulse: 95   Neurologic Exam Mental Status: Awake and fully alert. Oriented to place and time. Recent and remote memory intact. Attention span, concentration and fund of knowledge appropriate. Mood and  affect appropriate.  Cranial Nerves: Fundoscopic exam reveals sharp disc margins. Pupils equal, briskly reactive to light. Extraocular movements full without nystagmus. Mild saccadic dysmetria on left lateral gaze Visual fields full to confrontation. Hearing intact. Facial sensation intact. Face, tongue, palate moves normally and symmetrically.  Motor: Normal bulk and tone. Normal strength in all tested extremity muscles. Sensory.: Diminished touch and pinprick sensation on the left face and right hemibody only. Coordination: Rapid alternating movements normal in all extremities. Finger-to-nose and heel-to-shin performed accurately bilaterally. Gait and Station: Arises from chair without difficulty. Stance is broad-based.. Gait demonstrates mild ataxia and imbalance . unable to heel, toe and tandem walk without difficulty.  Reflexes: 1+ and symmetric. Toes downgoing.   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 with multiple vascular risk factors of hypertension, hyperlipidemia, diabetes, obesity and large vessel atherosclerotic disease    PLAN:  I had a long d/w patient and his friend Barnetta Chapel about his recent lateral medullary stroke stroke, risk for recurrent stroke/TIAs, personally independently reviewed imaging studies and stroke evaluation results and answered questions.Continue aspirin 81 mg daily and clopidogrel 75 mg daily  For 2 more months and then stop aspirin and continue clopidogrel alone for secondary stroke prevention and maintain strict control of hypertension with blood pressure goal below 130/90, diabetes with hemoglobin A1c goal below 6.5% and lipids with LDL cholesterol goal below 70 mg/dL. I also advised the patient to eat a healthy diet with plenty of whole grains, cereals, fruits and vegetables, exercise regularly and maintain ideal body weight Followup in  the future with my NP in 3 months or call earlier.the patient was advised to stay out of work for at least a month given his poor general medical condition. He feels better.  Greater than 50% of time during this 25 minute visit was spent on counseling,explanation of diagnosis, planning of further management, discussion with patient and family and coordination of care Antony Contras, MD  [facility] [address] [place], [place] [address]  Phone [phone] Fax [phone] Note: This document was prepared with digital dictation and possible smart phrase technology. Any transcriptional errors that result from this process are unintentional

## 2017-01-26 ENCOUNTER — Telehealth: Payer: Self-pay

## 2017-01-26 NOTE — Telephone Encounter (Signed)
Forms complete for [facility]. Sent to medical records to be fax. Patient aware of 50.00 for form fee.

## 2017-02-06 ENCOUNTER — Encounter: Payer: BLUE CROSS/BLUE SHIELD | Attending: Physical Medicine & Rehabilitation

## 2017-02-06 ENCOUNTER — Ambulatory Visit (HOSPITAL_BASED_OUTPATIENT_CLINIC_OR_DEPARTMENT_OTHER): Payer: BLUE CROSS/BLUE SHIELD | Admitting: Physical Medicine & Rehabilitation

## 2017-02-06 ENCOUNTER — Encounter: Payer: Self-pay | Admitting: Physical Medicine & Rehabilitation

## 2017-02-06 VITALS — BP 155/86 | HR 84

## 2017-02-06 DIAGNOSIS — Z8673 Personal history of transient ischemic attack (TIA), and cerebral infarction without residual deficits: Secondary | ICD-10-CM | POA: Insufficient documentation

## 2017-02-06 DIAGNOSIS — G463 Brain stem stroke syndrome: Secondary | ICD-10-CM

## 2017-02-06 DIAGNOSIS — E119 Type 2 diabetes mellitus without complications: Secondary | ICD-10-CM | POA: Insufficient documentation

## 2017-02-06 DIAGNOSIS — Z9889 Other specified postprocedural states: Secondary | ICD-10-CM | POA: Insufficient documentation

## 2017-02-06 DIAGNOSIS — I10 Essential (primary) hypertension: Secondary | ICD-10-CM | POA: Insufficient documentation

## 2017-02-06 DIAGNOSIS — Z8249 Family history of ischemic heart disease and other diseases of the circulatory system: Secondary | ICD-10-CM | POA: Insufficient documentation

## 2017-02-06 NOTE — Patient Instructions (Addendum)
May drive without restriction  May do light weights 5-10Lb

## 2017-02-06 NOTE — Progress Notes (Signed)
Subjective:    Patient ID: Nicholas Mejia, male    DOB: 01-May-1971, 46 y.o.   MRN: [id number]  HPI  Left Wallenberg syndrome onset 2/7 CIR 2/9-2/19  Still gets HHRN for BP and CBG checks. No falls Mod I ADL- takes longer "I feel ~40% better" Still has Left facial numbness some burning some improvement  Right side still numb, reduced temperature sensation Swallowing ok now Voice hoarse improving Pain Inventory Average Pain 0 Pain Right Now 0 My pain is no pain  In the last 24 hours, has pain interfered with the following? General activity 0 Relation with others 0 Enjoyment of life 0 What TIME of day is your pain at its worst? no pain Sleep (in general) NA  Pain is worse with: no pain Pain improves with: no pain Relief from Meds: no pain  Mobility walk without assistance how many minutes can you walk? 15 ability to climb steps?  yes do you drive?  yes Do you have any goals in this area?  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  Neuro/Psych weakness numbness tingling  Prior Studies .  Physicians involved in your care .   Family History  Problem Relation Age of Onset  . Heart attack Father   . Gout Brother    Social History   Social History  . Marital status: Single    Spouse name: N/A  . Number of children: N/A  . Years of education: N/A   Social History Main Topics  . Smoking status: Former Games developer  . Smokeless tobacco: Never Used  . Alcohol use Yes     Comment: seldom  . Drug use: No  . Sexual activity: Not Asked   Other Topics Concern  . None   Social History Narrative  . None   Past Surgical History:  Procedure Laterality Date  . EXTERNAL EAR SURGERY     Past Medical History:  Diagnosis Date  . Diabetes mellitus without complication (HCC)   . Hypertension   . Stroke (HCC)    BP (!) 155/86   Pulse 84   SpO2 94%   Opioid Risk Score:   Fall Risk Score:  `1  Depression screen PHQ  2/9  Depression screen PHQ 2/9 02/06/2017  Decreased Interest 0  Down, Depressed, Hopeless 0  PHQ - 2 Score 0  Altered sleeping 2  Tired, decreased energy 1  Change in appetite 0  Feeling bad or failure about yourself  0  Trouble concentrating 0  Moving slowly or fidgety/restless 0  Suicidal thoughts 0  PHQ-9 Score 3    Review of Systems  HENT: Negative.   Eyes: Negative.   Respiratory: Negative.   Cardiovascular: Negative.   Gastrointestinal: Negative.   Endocrine: Negative.   Genitourinary: Negative.   Musculoskeletal: Negative.   Skin: Negative.   Allergic/Immunologic: Negative.   Neurological: Positive for weakness and numbness.  Hematological: Negative.   Psychiatric/Behavioral: Negative.        Objective:   Physical Exam  Constitutional: He is oriented to person, place, and time. He appears well-developed and well-nourished.  HENT:  Head: Normocephalic and atraumatic.  Eyes: Conjunctivae and EOM are normal. Pupils are equal, round, and reactive to light.  Neck: Normal range of motion. Neck supple.  Musculoskeletal: Normal range of motion.  Neurological: He is alert and oriented to person, place, and time.  Psychiatric: He has a normal mood and affect. His behavior is normal. Judgment and thought content  normal.  Nursing note and vitals reviewed.  Pinprick sensation is reduced on the right side in the upper and lower limb Intact bilateral V1, reduced left V2 left V3   Motor strength is 5/5 bilateral deltoid, biceps, triceps, grip No evidence of dysmetria finger-nose-finger Standing balance is good. Romberg is negative. Ambulates without evidence of toe drag or knee instability. No widened base of support. Mood and affect are appropriate    Assessment & Plan:  1. 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 His primary residual deficits are right-sided, pinprick and temperature sensation in the upper and lower limb, left facial numbness and  dysesthesia.  Still has mild vocal hoarseness  I do not think he needs additional PT, OT or speech therapy. resume driving Will resume working at end of month Neurology follow-up as scheduled PCP follow-up

## 2017-04-21 ENCOUNTER — Encounter: Payer: Self-pay | Admitting: Nurse Practitioner

## 2017-04-21 ENCOUNTER — Ambulatory Visit (INDEPENDENT_AMBULATORY_CARE_PROVIDER_SITE_OTHER): Payer: BLUE CROSS/BLUE SHIELD | Admitting: Nurse Practitioner

## 2017-04-21 VITALS — BP 149/81 | HR 86 | Ht 74.0 in | Wt 347.4 lb

## 2017-04-21 DIAGNOSIS — IMO0002 Reserved for concepts with insufficient information to code with codable children: Secondary | ICD-10-CM

## 2017-04-21 DIAGNOSIS — I63112 Cerebral infarction due to embolism of left vertebral artery: Secondary | ICD-10-CM | POA: Diagnosis not present

## 2017-04-21 DIAGNOSIS — I10 Essential (primary) hypertension: Secondary | ICD-10-CM | POA: Diagnosis not present

## 2017-04-21 DIAGNOSIS — E118 Type 2 diabetes mellitus with unspecified complications: Secondary | ICD-10-CM | POA: Diagnosis not present

## 2017-04-21 DIAGNOSIS — E782 Mixed hyperlipidemia: Secondary | ICD-10-CM | POA: Diagnosis not present

## 2017-04-21 DIAGNOSIS — E1165 Type 2 diabetes mellitus with hyperglycemia: Secondary | ICD-10-CM

## 2017-04-21 NOTE — Progress Notes (Signed)
I have read the note, and I agree with the clinical assessment and plan.  Shantay Sonn A. Layken Doenges, MD, PhD Certified in Neurology, Clinical Neurophysiology, Sleep Medicine, Pain Medicine and Neuroimaging  [facility] [address], [address] [place], [place] [address] [phone]  

## 2017-04-21 NOTE — Patient Instructions (Signed)
Continue Plavix  75 mg daily for secondary stroke prevention  May stop aspirin Maintain strict control of hypertension with blood pressure goal below 130/90,  diabetes with hemoglobin A1c goal below 6.5%  Most recent 8.8 lipids with LDL cholesterol goal below 70 mg/dL. Continue Lipitor Healthy diet with plenty of whole grains, cereals, fruits and vegetables, exercise regularly and maintain ideal body Continue follow up with PCP for risk factor management Discharge from stroke clinic

## 2017-04-21 NOTE — Progress Notes (Signed)
[facility]  PATIENT: Nicholas Mejia DOB: May 09, 1971   REASON FOR VISIT: Follow-up for stroke HISTORY FROM: Patient alone at visit     HISTORY OF PRESENT ILLNESS:10/21/17 Nicholas Mejia is 61 year African-American male seen today for first office follow-up visit following hospital admission for stroke  2018. He is a ccompanied by his friend Barnetta Chapel. Akram Cookis an 46 y.o.malewho presented to the [facility] ED with c/c of left sided facial numbness beginning about 11 AM on 2/5 (LKW). He also had a headache involving his left periorbital and retro-orbital region. He was seen for this, receiving Toradol and an unknown BP medication. Soon thereafter he began experiencing numbness of the left side of his face. He went home, slept, and on awakening he experienced severe gait instability and left sided incoordination. Also felt as though he was "drifting to the left" while ambulating. He states that he has slight difficulty swallowing with a sensation that swallowed material does not move as well in the left side of his throat relative to the right when he swallows. He recently finished a course of antibiotic for pneumonia. He has a PMHx of DM. He denies prior stroke or MI. Patient was not administered IV t-PA secondary to delay in arrival. Hewas admitted to the for further evaluation and treatment. MRI scan of the brain showed left lateral medullary infarct involving posterior lateral medulla on the left. LDL cholesterol is elevated at 168 mg percent. Hemoglobin A1c was 9.5 CT angiogram of the neck showed 70% stenosis at the origin of the left vertebral and 50% origin stenosis on the right with calcification. There was severe atherosclerotic changes involving both carotid siphons.Patient was seen by physical occupational therapy and was felt safe to go home but outpatient therapy. He states his done well his balance is improving though he still has persistent numbness in his face and right side  of the body. He feels very tired and he is not been able to work. His primary physician and started on amlodipine but it made him feel sick and Dr. blood pressure too low. He has to stop it. His blood pressure is currently running in the 622-297 systolic range. His complains of severe constipation and plans were discussed this with his primary physician. His fasting sugars are yet elevated and range in the 176. Patient states his balance is poor however his been careful with his walking and not had any falls. His tolerate aspirin and Plavix well with minor bruising but no bleeding episodes. He is tolerating Lipitor well without muscle aches or pains. 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 He remains on Plavix and aspirin for secondary stroke prevention and was made aware he can stop aspirin and just stay on the Plavix. Blood pressure in the office today 149/81. Patient states his most recent hemoglobin A1c done at primary care was 8.8. He is taking gabapentin for his facial pain and also smoking marijuana on a daily basis. He claims this can control this right facial pain more than anything else. He remains on Lipitor without muscle cramps. He is on insulin and the dose was recently changed to 30 units in the a.m. 50 units in the p.m. of Lantus. He is also on metformin. He really does not follow a diabetic diet. He quit smoking in  January. 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 He returns for reevaluation ,   REVIEW OF SYSTEMS: Full 14 system review of systems performed and notable only for those listed, all others are neg:  Constitutional: neg  Cardiovascular: neg Ear/Nose/Throat: neg  Skin: neg Eyes: neg Respiratory:  neg Gastroitestinal: neg  Hematology/Lymphatic: neg  Endocrine: Intolerance to heat and cold Musculoskeletal: Walking difficulty Allergy/Immunology: neg Neurological: Numbness Psychiatric: neg Sleep : Insomnia   ALLERGIES: Allergies  Allergen Reactions  . Bee Venom Anaphylaxis and Swelling  . Penicillins     Swelling in feet  . Pollen Extract     HOME MEDICATIONS: Outpatient Medications Prior to Visit  Medication Sig Dispense Refill  . amLODipine (NORVASC) 5 MG tablet Take 1 tablet (5 mg total) by mouth daily. (Patient taking differently: Take 20 mg by mouth daily. ) 30 tablet 0  . aspirin EC 81 MG tablet Take 162 mg by mouth daily.    . blood glucose meter kit and supplies KIT Dispense based on patient and insurance preference. Use up to four times daily as directed. (FOR ICD-9 250.00, 250.01). 1 each 0  . chlorthalidone (HYGROTON) 25 MG tablet Take 25 mg by mouth daily.    . fluticasone (FLONASE) 50 MCG/ACT nasal spray USE 2 SPRAYS BY EACH NARE ROUTE DAILY.  2  . insulin glargine (LANTUS) 100 UNIT/ML injection Inject 0.15 mLs (15 Units total) into the skin daily. (Patient taking differently: Inject into the skin daily. Taking 30u  AM and 20 u PM) 10 mL 0  . Insulin Pen Needle 31G X 8 MM MISC 1 applicator by Does not apply route daily with breakfast. 100 each 0  . METFORMIN HCL ER PO Take 500 mg by mouth 2 (two) times daily.     . montelukast (SINGULAIR) 10 MG tablet Take 10 mg by mouth as needed.     . ondansetron (ZOFRAN) 4 MG tablet Take 1 tablet (4 mg total) by mouth every 8 (eight) hours as needed for nausea or vomiting. 90 tablet 0  . pantoprazole (PROTONIX) 40 MG tablet Take 1 tablet (40 mg total) by mouth 2 (two) times daily. (Patient not taking: Reported on 04/21/2017) 60 tablet 0  . atorvastatin (LIPITOR) 40 MG tablet Take 1 tablet (40 mg total) by mouth daily at 6 PM. 30 tablet 0  . senna-docusate (SENOKOT-S) 8.6-50 MG tablet Take 2 tablets by mouth at bedtime. (Patient  not taking: Reported on 04/21/2017) 100 tablet 0   No facility-administered medications prior to visit.     PAST MEDICAL HISTORY: Past Medical History:  Diagnosis Date  . Diabetes mellitus without complication ([facility])   . Hypertension   . Stroke [facility])     PAST SURGICAL HISTORY: Past Surgical History:  Procedure Laterality Date  . EXTERNAL EAR SURGERY      FAMILY HISTORY: Family History  Problem Relation Age of Onset  . Heart attack Father   . Gout Brother     SOCIAL HISTORY: Social History   Social History  . Marital status: Single    Spouse name: N/A  . Number of children: N/A  . Years of education: N/A   Occupational History  . Not on file.   Social History Main Topics  . Smoking status: Former Research scientist (life sciences)  . Smokeless tobacco: Never Used  . Alcohol use Yes     Comment: seldom  . Drug use: Yes    Types: Marijuana     Comment: daily  . Sexual  activity: Not on file   Other Topics Concern  . Not on file   Social History Narrative  . No narrative on file     PHYSICAL EXAM  Vitals:   04/21/17 0936  BP: (!) 149/81  Pulse: 86  Weight: (!) 347 lb 6.4 oz (157.6 kg)  Height: 6' 2"  (1.88 m)   Body mass index is 44.6 kg/m.  Generalized: Well developed, Morbidly obese male in no acute distress  Head: normocephalic and atraumatic,. Oropharynx benign  Neck: Supple, no carotid bruits  Cardiac: Regular rate rhythm, no murmur  Musculoskeletal: No deformity   Neurological examination   Mentation: Alert oriented to time, place, history taking. Attention span and concentration appropriate. Recent and remote memory intact.  Follows all commands speech and language fluent.   Cranial nerve II-XII: .Pupils were equal round reactive to light extraocular movements were full, visual field were full on confrontational test. Facial sensation and strength were normal. hearing was intact to finger rubbing bilaterally. Uvula tongue midline. head turning and shoulder shrug were  normal and symmetric.Tongue protrusion into cheek strength was normal. Motor: normal bulk and tone, full strength in the BUE, BLE, fine finger movements normal, no pronator drift. No focal weakness Sensory: normal and symmetric to light touch, pinprick, and  Vibration, except right lower extremity with decreased pinprick Coordination: finger-nose-finger, heel-to-shin bilaterally, no dysmetria Reflexes: 1+ upper lower and symmetric, plantar responses were flexor bilaterally. Gait and Station: Rising up from seated position without assistance, normal stance,  moderate stride, good arm swing, smooth turning, able to perform tiptoe, and heel walking without difficulty. Tandem gait is unsteady  DIAGNOSTIC DATA (LABS, IMAGING, TESTING) - I reviewed patient records, labs, notes, testing and imaging myself where available.  Lab Results  Component Value Date   WBC 13.4 (H) 01/09/2017   HGB 13.6 01/09/2017   HCT 41.7 01/09/2017   MCV 89.3 01/09/2017   PLT 251 01/09/2017      Component Value Date/Time   NA 135 01/09/2017 0419   K 3.7 01/09/2017 0419   CL 95 (L) 01/09/2017 0419   CO2 32 01/09/2017 0419   GLUCOSE 183 (H) 01/09/2017 0419   BUN 7 01/09/2017 0419   CREATININE 1.04 01/09/2017 0419   CALCIUM 8.4 (L) 01/09/2017 0419   PROT 7.3 01/03/2017 0512   ALBUMIN 3.1 (L) 01/03/2017 0512   AST 20 01/03/2017 0512   ALT 17 01/03/2017 0512   ALKPHOS 56 01/03/2017 0512   BILITOT 0.9 01/03/2017 0512   GFRNONAA >60 01/09/2017 0419   GFRAA >60 01/09/2017 0419   Lab Results  Component Value Date   CHOL 234 (H) 01/01/2017   HDL 33 (L) 01/01/2017   LDLCALC 168 (H) 01/01/2017   TRIG 166 (H) 01/01/2017   CHOLHDL 7.1 01/01/2017   Lab Results  Component Value Date   HGBA1C 9.5 (H) 01/01/2017     ASSESSMENT AND PLAN 37 year African-American male with 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 Leonie Man  who is out of the office today . This note is sent to the work in doctor.     Continue Plavix  75 mg daily for secondary stroke prevention  May stop aspirin Maintain strict control of hypertension with blood pressure goal below 130/90,  diabetes with hemoglobin A1c goal below 6.5%  Most recent 8.8 lipids with LDL cholesterol  goal below 70 mg/dL. Continue Lipitor Healthy diet with plenty of whole grains, cereals, fruits and vegetables, exercise regularly and maintain ideal body Continue follow up with PCP for stroke risk factor management Discharge from stroke clinic I spent 20 min in total face to face time with the patient more than 50% of which was spent counseling and coordination of care, reviewing test results reviewing medications and discussing and reviewing the diagnosis of stroke and stroke risk management to continue by primary care provider Dr.Bouska Dennie Bible, [facility], [facility], APRN  [facility] [address], [place], [place] [address] [phone]

## 2017-11-09 ENCOUNTER — Encounter (HOSPITAL_COMMUNITY): Payer: Self-pay | Admitting: Neurology

## 2017-11-09 ENCOUNTER — Other Ambulatory Visit: Payer: Self-pay

## 2017-11-09 ENCOUNTER — Emergency Department (HOSPITAL_COMMUNITY)
Admission: EM | Admit: 2017-11-09 | Discharge: 2017-11-09 | Disposition: A | Discharge status: Home / Self Care | Payer: BLUE CROSS/BLUE SHIELD | Attending: Emergency Medicine | Admitting: Emergency Medicine

## 2017-11-09 ENCOUNTER — Emergency Department (HOSPITAL_COMMUNITY): Payer: BLUE CROSS/BLUE SHIELD

## 2017-11-09 DIAGNOSIS — Z8673 Personal history of transient ischemic attack (TIA), and cerebral infarction without residual deficits: Secondary | ICD-10-CM | POA: Insufficient documentation

## 2017-11-09 DIAGNOSIS — Z794 Long term (current) use of insulin: Secondary | ICD-10-CM | POA: Diagnosis not present

## 2017-11-09 DIAGNOSIS — Z79899 Other long term (current) drug therapy: Secondary | ICD-10-CM | POA: Diagnosis not present

## 2017-11-09 DIAGNOSIS — N183 Chronic kidney disease, stage 3 (moderate): Secondary | ICD-10-CM | POA: Insufficient documentation

## 2017-11-09 DIAGNOSIS — R600 Localized edema: Secondary | ICD-10-CM | POA: Insufficient documentation

## 2017-11-09 DIAGNOSIS — Z87891 Personal history of nicotine dependence: Secondary | ICD-10-CM | POA: Diagnosis not present

## 2017-11-09 DIAGNOSIS — E1122 Type 2 diabetes mellitus with diabetic chronic kidney disease: Secondary | ICD-10-CM | POA: Insufficient documentation

## 2017-11-09 DIAGNOSIS — I129 Hypertensive chronic kidney disease with stage 1 through stage 4 chronic kidney disease, or unspecified chronic kidney disease: Secondary | ICD-10-CM | POA: Diagnosis not present

## 2017-11-09 DIAGNOSIS — H66002 Acute suppurative otitis media without spontaneous rupture of ear drum, left ear: Secondary | ICD-10-CM | POA: Diagnosis not present

## 2017-11-09 DIAGNOSIS — Z7982 Long term (current) use of aspirin: Secondary | ICD-10-CM | POA: Diagnosis not present

## 2017-11-09 DIAGNOSIS — J00 Acute nasopharyngitis [common cold]: Secondary | ICD-10-CM | POA: Diagnosis not present

## 2017-11-09 DIAGNOSIS — R609 Edema, unspecified: Secondary | ICD-10-CM

## 2017-11-09 DIAGNOSIS — R0602 Shortness of breath: Secondary | ICD-10-CM | POA: Diagnosis present

## 2017-11-09 LAB — BASIC METABOLIC PANEL
ANION GAP: 9 (ref 5–15)
BUN: 11 mg/dL (ref 6–20)
CO2: 31 mmol/L (ref 22–32)
Calcium: 8.4 mg/dL — ABNORMAL LOW (ref 8.9–10.3)
Chloride: 91 mmol/L — ABNORMAL LOW (ref 101–111)
Creatinine, Ser: 1.22 mg/dL (ref 0.61–1.24)
GFR calc Af Amer: >60 mL/min (ref >60)
GFR calc non Af Amer: >60 mL/min (ref >60)
GLUCOSE: 317 mg/dL — AB (ref 65–99)
POTASSIUM: 4.1 mmol/L (ref 3.5–5.1)
Sodium: 131 mmol/L — ABNORMAL LOW (ref 135–145)

## 2017-11-09 LAB — CBC
HEMATOCRIT: 42.1 % (ref 39.0–52.0)
HEMOGLOBIN: 13.8 g/dL (ref 13.0–17.0)
MCH: 29 pg (ref 26.0–34.0)
MCHC: 32.8 g/dL (ref 30.0–36.0)
MCV: 88.4 fL (ref 78.0–100.0)
Platelets: 299 10*3/uL (ref 150–400)
RBC: 4.76 MIL/uL (ref 4.22–5.81)
RDW: 12.9 % (ref 11.5–15.5)
WBC: 13.2 10*3/uL — ABNORMAL HIGH (ref 4.0–10.5)

## 2017-11-09 LAB — I-STAT TROPONIN, ED
Troponin i, poc: 0.08 ng/mL (ref 0.00–0.08)
Troponin i, poc: 0.07 ng/mL (ref 0.00–0.08)

## 2017-11-09 LAB — BRAIN NATRIURETIC PEPTIDE: B Natriuretic Peptide: 134.9 pg/mL — ABNORMAL HIGH (ref 0.0–100.0)

## 2017-11-09 MED ORDER — CEPHALEXIN 250 MG PO CAPS
1000.0000 mg | ORAL_CAPSULE | Freq: Once | ORAL | Status: AC
  Administered 2017-11-09: 1000 mg via ORAL
  Filled 2017-11-09: qty 4

## 2017-11-09 MED ORDER — FUROSEMIDE 20 MG PO TABS
40.0000 mg | ORAL_TABLET | Freq: Once | ORAL | Status: AC
  Administered 2017-11-09: 40 mg via ORAL
  Filled 2017-11-09: qty 2

## 2017-11-09 MED ORDER — CEFDINIR 300 MG PO CAPS: 300.0000 mg | ORAL_CAPSULE | Freq: Two times a day (BID) | ORAL | 0 refills | Status: AC

## 2017-11-09 NOTE — ED Triage Notes (Addendum)
Pt reports cp/sob, went to the nurse at work, BP was 162/102-encouraged him to come here due to swelling in BLE. Over the weekend started feeling bad with swelling, cp/sob. Has hx of HF. Is a x 4. CP radiates into left arm.

## 2017-11-09 NOTE — ED Provider Notes (Signed)
[facility] Provider Note   CSN: [id number] Arrival date & time: 11/09/17  1139     History   Chief Complaint Chief Complaint  Patient presents with  . Chest Pain    HPI Nicholas Mejia is a 46 y.o. male.  46 yo M with a chief complaint of shortness of breath and fatigue.  This been going on for the past week or so.  Having cough and congestion as well.  Patient has had difficulty sleeping.  He is having trouble pinpointing why that is.  No orthopnea or PND.  He did try and sleep upright which improved his symptoms briefly.  Has been falling asleep at work because he is not sleeping at night.  Has also had some transient left-sided sharp pains.  There for less than a second.  Seem to come and go.  Had some to his left arm as well.  Does not occur with the shortness of breath.  Shortness of breath is generalized sometimes happen when he exerts himself.  Patient thinks that he needs a CPAP machine.  Had a stroke recently this year.  Has had some chronic left-sided facial pain.  Mildly improved with his recent change in medications.  The patient was seen at his nurse at work who was concerned about his hypertension and leg swelling.   The history is provided by the patient.  Chest Pain   This is a new problem. The current episode started more than 2 days ago. The problem occurs constantly. The problem has been gradually worsening. The pain is present in the lateral region. The pain is at a severity of 6/10. The pain is severe. The quality of the pain is described as sharp and brief. The pain does not radiate. Duration of episode(s) is 2 seconds. Associated symptoms include cough and shortness of breath. Pertinent negatives include no abdominal pain, no fever, no headaches, no palpitations and no vomiting. He has tried nothing for the symptoms. The treatment provided no relief.  Pertinent negatives for past medical history include no MI.    Past Medical  History:  Diagnosis Date  . Diabetes mellitus without complication (HCC)   . Hypertension   . Stroke [facility](HCC)     Patient Active Problem List   Diagnosis Date Noted  . Nausea & vomiting   . Indigestion   . Gastroesophageal reflux disease   . Uncontrolled type 2 diabetes mellitus with complication, without long-term current use of insulin (HCC)   . Non-intractable vomiting with nausea   . Stage 2 chronic kidney disease   . Ataxia, post-stroke   . Stage 3 chronic kidney disease (HCC)   . Hyponatremia   . Type II diabetes mellitus, uncontrolled (HCC)   . Benign essential HTN   . Mixed hyperlipidemia   . 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 Diabetes mellitus without complication (HCC) 12/31/2016  . AKI (acute kidney injury) (HCC) 12/31/2016  . Leukocytosis 12/31/2016  . Essential hypertension 12/31/2016    Past Surgical History:  Procedure Laterality Date  . EXTERNAL EAR SURGERY         Home Medications    Prior to Admission medications   Medication Sig Start Date End Date Taking? Authorizing Provider  Acetylcysteine (N-ACETYL-L-CYSTEINE) 600 MG CAPS Take 1 tablet by mouth daily.    Yes [provider]  aspirin 325 MG tablet Take 325 mg by mouth daily as needed.  Yes [provider]  aspirin EC 81 MG tablet Take 162 mg by mouth daily.   Yes [provider]  Azilsartan Medoxomil (EDARBI) 80 MG TABS Take 80 mg by mouth daily.   Yes [provider]  Azilsartan-Chlorthalidone (EDARBYCLOR) 40-25 MG TABS Take 1 tablet by mouth daily.   Yes [provider]  B Complex-Biotin-FA (B COMPLETE PO) Take 1 tablet by mouth at bedtime.   Yes [provider]  BLACK CURRANT SEED OIL PO Take 10 mLs by mouth daily. liquid   Yes [provider]  Coenzyme Q10 (CO Q-10) 300 MG CAPS Take 1 tablet by mouth daily.    Yes [provider]  Cyanocobalamin (VITAMIN  B-12 IJ) Inject 1 Units as directed every 30 (thirty) days.   Yes [provider]  ergocalciferol (VITAMIN D2) 50000 units capsule Take 1 capsule by mouth daily.   Yes [provider]  montelukast (SINGULAIR) 10 MG tablet Take 10 mg by mouth as needed.  11/26/16 11/26/17 Yes [provider]  nebivolol (BYSTOLIC) 10 MG tablet Take 10 mg by mouth daily.   Yes [provider]  OVER THE COUNTER MEDICATION Take 1 tablet by mouth daily. Flamex   Yes [provider]  OVER THE COUNTER MEDICATION Take 1 tablet by mouth 2 (two) times daily. Glucocontrol   Yes [provider]  testosterone cypionate (DEPOTESTOSTERONE CYPIONATE) 200 MG/ML injection Inject 200 mLs into the muscle every 7 (seven) days.    Yes [provider]  traMADol (ULTRAM) 50 MG tablet Take 150 mg by mouth at bedtime. 10/18/17  Yes [provider]  Turmeric 1053 MG TABS Take 1 tablet by mouth 2 (two) times daily.   Yes [provider]  amLODipine (NORVASC) 5 MG tablet Take 1 tablet (5 mg total) by mouth daily. Patient taking differently: Take 20 mg by mouth daily.  01/10/17   Love, Evlyn KannerPamela S, PA-C  cefdinir (OMNICEF) 300 MG capsule Take 1 capsule (300 mg total) by mouth 2 (two) times daily for 7 days. 11/09/17 11/16/17  Melene PlanFloyd, Alieu Finnigan, DO  insulin glargine (LANTUS) 100 UNIT/ML injection Inject 0.15 mLs (15 Units total) into the skin daily. Patient not taking: Reported on 11/09/2017 01/09/17   Love, Evlyn KannerPamela S, PA-C  ondansetron (ZOFRAN) 4 MG tablet Take 1 tablet (4 mg total) by mouth every 8 (eight) hours as needed for nausea or vomiting. Patient not taking: Reported on 11/09/2017 01/09/17   Love, Evlyn KannerPamela S, PA-C  pantoprazole (PROTONIX) 40 MG tablet Take 1 tablet (40 mg total) by mouth 2 (two) times daily. Patient not taking: Reported on 04/21/2017 01/09/17   Love, Evlyn KannerPamela S, PA-C    Family History Family History  Problem Relation Age of Onset  . Heart attack Father   .  Gout Brother     Social History Social History   Tobacco Use  . Smoking status: Former Games developermoker  . Smokeless tobacco: Never Used  Substance Use Topics  . Alcohol use: Yes    Comment: seldom  . Drug use: Yes    Types: Marijuana    Comment: daily     Allergies   Bee venom; Penicillins; and Pollen extract   Review of Systems Review of Systems  Constitutional: Negative for chills and fever.  HENT: Positive for congestion. Negative for facial swelling.   Eyes: Negative for discharge and visual disturbance.  Respiratory: Positive for cough and shortness of breath.   Cardiovascular: Positive for leg swelling. Negative for chest pain and  palpitations.  Gastrointestinal: Negative for abdominal pain, diarrhea and vomiting.  Musculoskeletal: Negative for arthralgias and myalgias.  Skin: Negative for color change and rash.  Neurological: Negative for tremors, syncope and headaches.  Psychiatric/Behavioral: Negative for confusion and dysphoric mood.     Physical Exam Updated Vital Signs BP (!) 170/93   Pulse 87   Temp 97.9 F (36.6 C) (Oral)   Resp 17   Ht 6\' 2"  (1.88 m)   Wt (!) 163.3 kg (360 lb)   SpO2 100%   BMI 46.22 kg/m   Physical Exam  Constitutional: He is oriented to person, place, and time. He appears well-developed and well-nourished.  HENT:  Head: Normocephalic and atraumatic.  Swollen turbinates, posterior nasal drip, no noted sinus ttp, left TM with purulent effusion bulging and erythema right TM normal.   Eyes: EOM are normal. Pupils are equal, round, and reactive to light.  Neck: Normal range of motion. Neck supple. No JVD present.  Cardiovascular: Normal rate and regular rhythm. Exam reveals no gallop and no friction rub.  No murmur heard. Pulmonary/Chest: No respiratory distress. He has no wheezes.  Abdominal: He exhibits no distension and no mass. There is no tenderness. There is no rebound and no guarding.  Musculoskeletal: Normal range of motion. He  exhibits edema (trace, equal bilaterally).  Neurological: He is alert and oriented to person, place, and time.  Skin: No rash noted. No pallor.  Psychiatric: He has a normal mood and affect. His behavior is normal.  Nursing note and vitals reviewed.    ED Treatments / Results  Labs (all labs ordered are listed, but only abnormal results are displayed) Labs Reviewed  BASIC METABOLIC PANEL - Abnormal; Notable for the following components:      Result Value   Sodium 131 (*)    Chloride 91 (*)    Glucose, Bld 317 (*)    Calcium 8.4 (*)    All other components within normal limits  CBC - Abnormal; Notable for the following components:   WBC 13.2 (*)    All other components within normal limits  BRAIN NATRIURETIC PEPTIDE - Abnormal; Notable for the following components:   B Natriuretic Peptide 134.9 (*)    All other components within normal limits  I-STAT TROPONIN, ED  I-STAT TROPONIN, ED    EKG  EKG Interpretation  Date/Time:  Monday November 09 2017 11:45:09 EST Ventricular Rate:  93 PR Interval:  194 QRS Duration: 102 QT Interval:  360 QTC Calculation: 447 R Axis:   138 Text Interpretation:  Normal sinus rhythm Possible Left atrial enlargement Right axis deviation Incomplete right bundle branch block Septal infarct , age undetermined Abnormal ECG No significant change since last tracing Confirmed by Melene Plan [phone]) on 11/09/2017 3:56:54 PM       Radiology Dg Chest 2 View  Result Date: 11/09/2017 CLINICAL DATA:  Chest pain, shortness of breath, found to be hypertensive. Patient porch bilateral lower extremity edema. History of previous CVA, diabetes, chronic renal insufficiency, former smoker. EXAM: CHEST  2 VIEW COMPARISON:  PA and lateral chest x-ray of July 08, 2017 FINDINGS: The lungs are adequately inflated. There is linear scarring in the periphery of the left mid lung. The cardiac silhouette is enlarged. The central pulmonary vascularity is prominent. The  trachea is midline. There is no pleural effusion. The bony thorax is unremarkable. IMPRESSION: Cardiomegaly with mild pulmonary vascular congestion is consistent with low-grade CHF. No significant pulmonary edema currently. Electronically Signed   By: Onalee Hua  [place] M.D.   On: 11/09/2017 12:42    Procedures Procedures (including critical care time)  Medications Ordered in ED Medications  furosemide (LASIX) tablet 40 mg (40 mg Oral Given 11/09/17 1704)  cephALEXin (KEFLEX) capsule 1,000 mg (1,000 mg Oral Given 11/09/17 1704)     Initial Impression / Assessment and Plan / ED Course  I have reviewed the triage vital signs and the nursing notes.  Pertinent labs & imaging results that were available during my care of the patient were reviewed by me and considered in my medical decision making (see chart for details).     46 yo M with a chief complaint of chest pain.  This sounds completely atypical of ACS.  Does not occur on exertion happens for less than a second at a time.  Sharp and pinpoint.  Patient has a delta troponin that are both negative.  EKG without a specific concerning finding.  The patient does have some edema which is new for him.  This could be early heart failure though it sounds that the patient has been spending more time upright is that enables him to breathe better with his upper respiratory symptoms.  He does have otitis media and I will treat with antibiotics.  He has a follow-up appointment scheduled for Thursday.  We will have him call his family physician this evening and see if they want an earlier appointment.  5:07 PM:  I have discussed the diagnosis/risks/treatment options with the patient and family and believe the pt to be eligible for discharge home to follow-up with PCP. We also discussed returning to the ED immediately if new or worsening sx occur. We discussed the sx which are most concerning (e.g., sudden worsening pain, fever, inability to tolerate by mouth)  that necessitate immediate return. Medications administered to the patient during their visit and any new prescriptions provided to the patient are listed below.  Medications given during this visit Medications  furosemide (LASIX) tablet 40 mg (40 mg Oral Given 11/09/17 1704)  cephALEXin (KEFLEX) capsule 1,000 mg (1,000 mg Oral Given 11/09/17 1704)     The patient appears reasonably screen and/or stabilized for discharge and I doubt any other medical condition or other [facility] requiring further screening, evaluation, or treatment in the ED at this time prior to discharge.    Final Clinical Impressions(s) / ED Diagnoses   Final diagnoses:  Acute suppurative otitis media of left ear without spontaneous rupture of tympanic membrane, recurrence not specified  Acute nasopharyngitis  Peripheral edema    ED Discharge Orders        Ordered    cefdinir (OMNICEF) 300 MG capsule  2 times daily     11/09/17 1654       Melene Plan, DO 11/09/17 1707

## 2017-11-09 NOTE — Discharge Instructions (Signed)
Take tylenol 2 pills 4 times a day.  Drink plenty of fluids.  Return for worsening shortness of breath, headache, confusion. Follow up with your family doctor.     

## 2017-11-09 NOTE — ED Notes (Signed)
Pt stable, ambulatory, states understanding of discharge instructions 

## 2017-11-27 ENCOUNTER — Encounter (HOSPITAL_BASED_OUTPATIENT_CLINIC_OR_DEPARTMENT_OTHER): Payer: Self-pay

## 2017-11-27 DIAGNOSIS — G4710 Hypersomnia, unspecified: Secondary | ICD-10-CM

## 2017-11-27 DIAGNOSIS — R0683 Snoring: Secondary | ICD-10-CM

## 2017-11-27 DIAGNOSIS — G4733 Obstructive sleep apnea (adult) (pediatric): Secondary | ICD-10-CM

## 2017-11-27 DIAGNOSIS — R5383 Other fatigue: Secondary | ICD-10-CM

## 2017-11-27 DIAGNOSIS — G4709 Other insomnia: Secondary | ICD-10-CM

## 2017-12-18 ENCOUNTER — Encounter (HOSPITAL_BASED_OUTPATIENT_CLINIC_OR_DEPARTMENT_OTHER): Payer: Self-pay

## 2017-12-18 ENCOUNTER — Ambulatory Visit (HOSPITAL_BASED_OUTPATIENT_CLINIC_OR_DEPARTMENT_OTHER): Payer: BLUE CROSS/BLUE SHIELD

## 2017-12-21 DIAGNOSIS — R9439 Abnormal result of other cardiovascular function study: Secondary | ICD-10-CM

## 2017-12-21 NOTE — H&P (Signed)
Labs 12/17/2017: Serum glucose 240 mg, BUN 23, creatinine 1.16, eGFR 74/86 mL, HB 13.1/HCT 41.7, normal indicis.  Pro time normal.

## 2017-12-21 NOTE — H&P (Addendum)
Nicholas SayerWarren E Mejia 12/02/2017 11:00 AM Location: [facility] Patient #: [id number] DOB: 09-16-1971 Single / Language: [place] / Race: Black or African American Male   History of Present Illness [name]([name] MD; 12/03/2017 5:11 AM) The patient is a 47 year old male who presents for shortness of breath. 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  In the last few months, patient has had exertional dyspnea and leg swelling. He was treated with Lasix with some improvement. Echocardiogram September 2018 showed EF of 40-45%, grade 2 diastolic dysfunction. His exertional dyspnea is now class II with minimal leg edema. He does complain of occasional chest pain that is central, retrosternal, sometimes associated with exertion. Blood pressure is elevated today, but he says this usually much better controlled at baseline.  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 He walks 2-3 months on average and a, but does not do any regular exercise.   Past Medical History:  Diagnosis Date  . Diabetes mellitus without complication (HCC)   . Hypertension   . Stroke [facility](HCC)    Past Surgical History:  Procedure Laterality Date  . EXTERNAL EAR SURGERY     Social History   Socioeconomic History  . Marital status: Single    Spouse name: Not on file  . Number of children: Not on file  . Years of education: Not on file  . Highest education level: Not on file  Social Needs  . Financial resource strain: Not on file  . Food insecurity - worry: Not on file  . Food insecurity - inability: Not on file  . Transportation needs - medical: Not on file  . Transportation needs - non-medical: Not on file  Occupational History  . Not on file  Tobacco Use  . Smoking status: Former  Games developermoker  . Smokeless tobacco: Never Used  Substance and Sexual Activity  . Alcohol use: Yes    Comment: seldom  . Drug use: Yes    Types: Marijuana    Comment: daily  . Sexual activity: Not on file  Other Topics Concern  . Not on file  Social History Narrative  . Not on file   Family History  Problem Relation Age of Onset  . Heart attack Father   . Gout Brother       No current facility-administered medications on file prior to encounter.    Current Outpatient Medications on File Prior to Encounter  Medication Sig Dispense Refill  . aspirin EC 81 MG tablet Take 81 mg by mouth daily.     [name]. atorvastatin (LIPITOR) 40 MG tablet Take 40 mg by mouth daily.    . Azilsartan Medoxomil (EDARBI) 80 MG TABS Take 80 mg by mouth daily.    . clopidogrel (PLAVIX) 75 MG tablet Take 75 mg by mouth daily.    . Coenzyme Q10 (CO Q-10) 400 MG CAPS Take 1 tablet by mouth 2 (two) times daily.     . furosemide (LASIX) 20 MG tablet Take 20 mg by mouth daily.    . metFORMIN (GLUCOPHAGE-XR) 500 MG 24 hr tablet Take 500 mg by mouth 2 (two) times daily.    . montelukast (SINGULAIR) 10 MG tablet Take 10 mg by mouth at bedtime as needed.    . Probiotic Product (PROBIOTIC DAILY PO) Take 1 capsule by mouth 2 (two) times  daily.    . traMADol (ULTRAM) 50 MG tablet Take 150 mg by mouth at bedtime.    . Turmeric 500 MG CAPS Take 1 tablet by mouth 2 (two) times daily.        Review of Systems [name] [name]; 12/03/2017 5:09 AM) Cardiovascular Present- Chest Pain, Difficulty Breathing On Exertion and Hypertension. Not Present- Claudications, Difficulty Breathing Lying Down, Edema, Orthopnea and Palpitations. Neurological Note: Mild residual parasthesias All other systems negative  Vitals Addendum Note(Samanta Gal MD; 12/07/2017 9:18 AM) Vitals 12/02/2017 Pulse 84 O2 sat 97% BP 162/90 mmHhg Weight 348 lb Ht 74 in BMI 44.7 Kg/m2 BSA 2.75 m2     Physical Exam ([name] MD;  12/03/2017 5:10 AM) General Mental Status-Alert. General Appearance-Cooperative and Appears stated age. Build & Nutrition-Morbidly obese.  Head and Neck Thyroid Gland Characteristics - normal size and consistency and no palpable nodules.  Chest and Lung Exam Chest and lung exam reveals -quiet, even and easy respiratory effort with no use of accessory muscles, non-tender and on auscultation, normal breath sounds, no adventitious sounds.  Cardiovascular Cardiovascular examination reveals -normal heart sounds, regular rate and rhythm with no murmurs, carotid auscultation reveals no bruits, abdominal aorta auscultation reveals no bruits and no prominent pulsation and femoral artery auscultation bilaterally reveals normal pulses, no bruits, no thrills.  Abdomen Palpation/Percussion Palpation and Percussion of the abdomen reveal - Non Tender and No hepatosplenomegaly.  Peripheral Vascular Lower Extremity Palpation - Edema - Bilateral - 1+ Pitting edema. Dorsalis pedis pulse - Bilateral - 1+. Posterior tibial pulse - Bilateral - Normal. Carotid arteries - Bilateral-No Carotid bruit.  Neurologic Neurologic evaluation reveals -alert and oriented x 3 with no impairment of recent or remote memory. Motor-Grossly intact without any focal deficits.  Musculoskeletal Global Assessment Left Lower Extremity - no deformities, masses or tenderness, no known fractures. Right Lower Extremity - no deformities, masses or tenderness, no known fractures.    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  Exertional dyspnea, occasional chest pain, mildly reduced he is in patient with high risk factors for CAD.  Recommend Lexiscan/walking nuclear stress test. Emphasize medication compliance. Once ischemic evaluation is complete, needs to embark on a regular exercise and lose weight.  Currently on Plavix as per neurology recommendation.Continue Lipitor and current antihypertensive therapy. Blood pressure elevated today, but lower at basline. Will need follow up.  Cc Andi Devon, MD  Signed electronically by Truett Mainland, MD (12/03/2017 5:13 AM)   Assessment & Plan [name] Nikoli Nasser MD; 12/07/2017 9:13 AM) Abnormal stress test (R94.39) Story: Lexiscan sestamibi stress test 12/04/2017: 1. The resting electrocardiogram demonstrated normal sinus rhythm, normal resting conduction and nonspecific ST-T changes. Stress EKG is non-diagnostic for ischemia as it a pharmacologic stress using Lexiscan. Resting blood pressure was 160/100 mmHg and peak effect blood pressure was 152/92 mmHg. Stress symptoms included dyspnea, dizziness. 2. Left ventricular cavity is noted to be enlarged on the rest and stress studies with LVEDV of 216 mL. SPECT images demonstrate large perfusion abnormality of severe intensity in the basal inferior, mid inferior, apical anterior and apical inferior myocardial wall(s) on the stress images. The defect is present on the resting images consistent with prior myocardial infarction. There is very minimal peri-infarct ischemia. The left ventricular ejection fraction was calculated to  be 39% with global hypokinesis. This is a high risk study, clinical correlation recommended in view of BMI 44.

## 2017-12-22 ENCOUNTER — Ambulatory Visit (HOSPITAL_COMMUNITY)
Admission: RE | Admit: 2017-12-22 | Discharge: 2017-12-22 | Disposition: A | Discharge status: Home / Self Care | Payer: BLUE CROSS/BLUE SHIELD | Source: Ambulatory Visit | Attending: Cardiology | Admitting: Cardiology

## 2017-12-22 ENCOUNTER — Encounter (HOSPITAL_COMMUNITY)
Admission: RE | Disposition: A | Discharge status: Home / Self Care | Payer: Self-pay | Source: Ambulatory Visit | Attending: Cardiology

## 2017-12-22 DIAGNOSIS — Z8349 Family history of other endocrine, nutritional and metabolic diseases: Secondary | ICD-10-CM | POA: Insufficient documentation

## 2017-12-22 DIAGNOSIS — R0609 Other forms of dyspnea: Secondary | ICD-10-CM | POA: Insufficient documentation

## 2017-12-22 DIAGNOSIS — Z79899 Other long term (current) drug therapy: Secondary | ICD-10-CM | POA: Insufficient documentation

## 2017-12-22 DIAGNOSIS — Z6841 Body Mass Index (BMI) 40.0 and over, adult: Secondary | ICD-10-CM | POA: Insufficient documentation

## 2017-12-22 DIAGNOSIS — Z7984 Long term (current) use of oral hypoglycemic drugs: Secondary | ICD-10-CM | POA: Insufficient documentation

## 2017-12-22 DIAGNOSIS — I2510 Atherosclerotic heart disease of native coronary artery without angina pectoris: Secondary | ICD-10-CM | POA: Diagnosis not present

## 2017-12-22 DIAGNOSIS — Z8379 Family history of other diseases of the digestive system: Secondary | ICD-10-CM | POA: Insufficient documentation

## 2017-12-22 DIAGNOSIS — I10 Essential (primary) hypertension: Secondary | ICD-10-CM | POA: Insufficient documentation

## 2017-12-22 DIAGNOSIS — Z8249 Family history of ischemic heart disease and other diseases of the circulatory system: Secondary | ICD-10-CM | POA: Insufficient documentation

## 2017-12-22 DIAGNOSIS — Z7982 Long term (current) use of aspirin: Secondary | ICD-10-CM | POA: Diagnosis not present

## 2017-12-22 DIAGNOSIS — Z87891 Personal history of nicotine dependence: Secondary | ICD-10-CM | POA: Insufficient documentation

## 2017-12-22 DIAGNOSIS — R9439 Abnormal result of other cardiovascular function study: Secondary | ICD-10-CM | POA: Insufficient documentation

## 2017-12-22 DIAGNOSIS — E119 Type 2 diabetes mellitus without complications: Secondary | ICD-10-CM | POA: Diagnosis not present

## 2017-12-22 DIAGNOSIS — E785 Hyperlipidemia, unspecified: Secondary | ICD-10-CM | POA: Diagnosis not present

## 2017-12-22 DIAGNOSIS — Z8673 Personal history of transient ischemic attack (TIA), and cerebral infarction without residual deficits: Secondary | ICD-10-CM | POA: Insufficient documentation

## 2017-12-22 DIAGNOSIS — R600 Localized edema: Secondary | ICD-10-CM | POA: Insufficient documentation

## 2017-12-22 DIAGNOSIS — R0602 Shortness of breath: Secondary | ICD-10-CM | POA: Diagnosis present

## 2017-12-22 HISTORY — PX: LEFT HEART CATH AND CORONARY ANGIOGRAPHY: CATH118249

## 2017-12-22 LAB — GLUCOSE, CAPILLARY: Glucose-Capillary: 229 mg/dL — ABNORMAL HIGH (ref 65–99)

## 2017-12-22 SURGERY — LEFT HEART CATH AND CORONARY ANGIOGRAPHY
Anesthesia: LOCAL

## 2017-12-22 MED ORDER — ASPIRIN 81 MG PO CHEW
81.0000 mg | CHEWABLE_TABLET | ORAL | Status: AC
  Administered 2017-12-22: 81 mg via ORAL

## 2017-12-22 MED ORDER — IOPAMIDOL (ISOVUE-370) INJECTION 76%
INTRAVENOUS | Status: AC
  Filled 2017-12-22: qty 100

## 2017-12-22 MED ORDER — FENTANYL CITRATE (PF) 100 MCG/2ML IJ SOLN
INTRAMUSCULAR | Status: DC | PRN
  Administered 2017-12-22: 50 ug via INTRAVENOUS

## 2017-12-22 MED ORDER — SODIUM CHLORIDE 0.9% FLUSH: 3.0000 mL | INTRAVENOUS | Status: DC | PRN

## 2017-12-22 MED ORDER — METOPROLOL TARTRATE 25 MG PO TABS: 25.0000 mg | ORAL_TABLET | Freq: Two times a day (BID) | ORAL | 1 refills | Status: DC

## 2017-12-22 MED ORDER — CLOPIDOGREL BISULFATE 75 MG PO TABS
ORAL_TABLET | ORAL | Status: AC
  Administered 2017-12-22: 75 mg via ORAL
  Filled 2017-12-22: qty 1

## 2017-12-22 MED ORDER — HEPARIN SODIUM (PORCINE) 1000 UNIT/ML IJ SOLN
INTRAMUSCULAR | Status: DC | PRN
  Administered 2017-12-22: 6000 [IU] via INTRAVENOUS

## 2017-12-22 MED ORDER — MIDAZOLAM HCL 2 MG/2ML IJ SOLN
INTRAMUSCULAR | Status: DC | PRN
  Administered 2017-12-22: 1 mg via INTRAVENOUS

## 2017-12-22 MED ORDER — IOPAMIDOL (ISOVUE-370) INJECTION 76%
INTRAVENOUS | Status: AC
  Filled 2017-12-22: qty 50

## 2017-12-22 MED ORDER — HYDRALAZINE HCL 20 MG/ML IJ SOLN
INTRAMUSCULAR | Status: DC | PRN
  Administered 2017-12-22: 10 mg via INTRAVENOUS

## 2017-12-22 MED ORDER — HEPARIN (PORCINE) IN NACL 2-0.9 UNIT/ML-% IJ SOLN
INTRAMUSCULAR | Status: AC | PRN
  Administered 2017-12-22: 1000 mL

## 2017-12-22 MED ORDER — ASPIRIN 81 MG PO CHEW
CHEWABLE_TABLET | ORAL | Status: AC
  Administered 2017-12-22: 81 mg via ORAL
  Filled 2017-12-22: qty 1

## 2017-12-22 MED ORDER — SODIUM CHLORIDE 0.9 % IV SOLN
INTRAVENOUS | Status: AC
  Administered 2017-12-22: 11:00:00 via INTRAVENOUS

## 2017-12-22 MED ORDER — NITROGLYCERIN 1 MG/10 ML FOR IR/CATH LAB
INTRA_ARTERIAL | Status: AC
  Filled 2017-12-22: qty 10

## 2017-12-22 MED ORDER — LABETALOL HCL 5 MG/ML IV SOLN
INTRAVENOUS | Status: DC | PRN
  Administered 2017-12-22: 10 mg via INTRAVENOUS

## 2017-12-22 MED ORDER — MIDAZOLAM HCL 2 MG/2ML IJ SOLN
INTRAMUSCULAR | Status: AC
  Filled 2017-12-22: qty 2

## 2017-12-22 MED ORDER — VERAPAMIL HCL 2.5 MG/ML IV SOLN
INTRAVENOUS | Status: AC
  Filled 2017-12-22: qty 2

## 2017-12-22 MED ORDER — CLOPIDOGREL BISULFATE 75 MG PO TABS
75.0000 mg | ORAL_TABLET | Freq: Once | ORAL | Status: AC
  Administered 2017-12-22: 75 mg via ORAL

## 2017-12-22 MED ORDER — SODIUM CHLORIDE 0.9% FLUSH: 3.0000 mL | Freq: Two times a day (BID) | INTRAVENOUS | Status: DC

## 2017-12-22 MED ORDER — METOPROLOL TARTRATE 25 MG PO TABS
25.0000 mg | ORAL_TABLET | Freq: Two times a day (BID) | ORAL | Status: DC
  Filled 2017-12-22 (×2): qty 1

## 2017-12-22 MED ORDER — SODIUM CHLORIDE 0.9 % IV SOLN: 250.0000 mL | INTRAVENOUS | Status: DC | PRN

## 2017-12-22 MED ORDER — LIDOCAINE HCL (PF) 1 % IJ SOLN
INTRAMUSCULAR | Status: AC
  Filled 2017-12-22: qty 30

## 2017-12-22 MED ORDER — LABETALOL HCL 5 MG/ML IV SOLN
INTRAVENOUS | Status: AC
  Filled 2017-12-22: qty 4

## 2017-12-22 MED ORDER — VERAPAMIL HCL 2.5 MG/ML IV SOLN
INTRA_ARTERIAL | Status: DC | PRN
  Administered 2017-12-22: 10 mL via INTRA_ARTERIAL

## 2017-12-22 MED ORDER — HEPARIN SODIUM (PORCINE) 1000 UNIT/ML IJ SOLN
INTRAMUSCULAR | Status: AC
  Filled 2017-12-22: qty 1

## 2017-12-22 MED ORDER — HYDRALAZINE HCL 20 MG/ML IJ SOLN
INTRAMUSCULAR | Status: AC
  Filled 2017-12-22: qty 1

## 2017-12-22 MED ORDER — FENTANYL CITRATE (PF) 100 MCG/2ML IJ SOLN
INTRAMUSCULAR | Status: AC
  Filled 2017-12-22: qty 2

## 2017-12-22 MED ORDER — LIDOCAINE HCL (PF) 1 % IJ SOLN
INTRAMUSCULAR | Status: DC | PRN
  Administered 2017-12-22: 2 mL

## 2017-12-22 MED ORDER — IOPAMIDOL (ISOVUE-370) INJECTION 76%
INTRAVENOUS | Status: DC | PRN
  Administered 2017-12-22: 115 mL via INTRA_ARTERIAL

## 2017-12-22 MED ORDER — HEPARIN (PORCINE) IN NACL 2-0.9 UNIT/ML-% IJ SOLN
INTRAMUSCULAR | Status: AC
  Filled 2017-12-22: qty 1000

## 2017-12-22 SURGICAL SUPPLY — 13 items
CATH 5FR JL3.5 JR4 ANG PIG MP (CATHETERS) ×2 IMPLANT
CATH INFINITI 5 FR 3DRC (CATHETERS) ×2 IMPLANT
CATH INFINITI 5 FR AR1 MOD (CATHETERS) ×2 IMPLANT
CATH INFINITI 5 FR MPA2 (CATHETERS) ×2 IMPLANT
DEVICE RAD COMP TR BAND LRG (VASCULAR PRODUCTS) ×2 IMPLANT
GLIDESHEATH SLEND A-KIT 6F 22G (SHEATH) ×2 IMPLANT
GUIDEWIRE INQWIRE 1.5J.035X260 (WIRE) ×1 IMPLANT
HOVERMATT SINGLE USE (MISCELLANEOUS) ×2 IMPLANT
INQWIRE 1.5J .035X260CM (WIRE) ×2
KIT HEART LEFT (KITS) ×2 IMPLANT
PACK CARDIAC CATHETERIZATION (CUSTOM PROCEDURE TRAY) ×2 IMPLANT
TRANSDUCER W/STOPCOCK (MISCELLANEOUS) ×2 IMPLANT
TUBING CIL FLEX 10 FLL-RA (TUBING) ×2 IMPLANT

## 2017-12-22 NOTE — Discharge Instructions (Signed)

## 2017-12-22 NOTE — Interval H&P Note (Signed)
History and Physical Interval Note:  12/22/2017 12:13 PM  Nicholas Mejia  has presented today for surgery, with the diagnosis of positive stress test - cp  The various methods of treatment have been discussed with the patient and family. After consideration of risks, benefits and other options for treatment, the patient has consented to  Procedure(s): LEFT HEART CATH AND CORONARY ANGIOGRAPHY (N/A) as a surgical intervention .  The patient's history has been reviewed, patient examined, no change in status, stable for surgery.  I have reviewed the patient's chart and labs.  Questions were answered to the patient's satisfaction.    2016/2017 Appropriate Use Criteria for Coronary Revascularization Clinical Presentation: Diabetes Mellitus? Symptom Status? S/P CABG? Antianginal Therapy (# of long-acting drugs)? Results of Non-invasive testing? FFR/iFR results in all diseased vessels? Patient undergoing renal transplant? Patient undergoing percutaneous valve procedure (TAVR, MitraClip, Others)? Symptom Status:  Ischemic Symptoms  Non-invasive Testing:  Intermediate Risk  If no or indeterminate stress test, FFR/iFR results in all diseased vessels:  N/A  Diabetes Mellitus:  Yes  S/P CABG:  No  Antianginal therapy (number of long-acting drugs):  0  Patient undergoing renal transplant:  No  Patient undergoing percutaneous valve procedure:  No    newline 1 Vessel Disease PCI CABG  No proximal LAD involvement, No proximal left dominant LCX involvement M (5); Indication 2 M (4); Indication 2   Proximal left dominant LCX involvement M (6); Indication 5 M (6); Indication 5   Proximal LAD involvement M (6); Indication 5 M (6); Indication 5   newline 2 Vessel Disease  No proximal LAD involvement M (6); Indication 8 M (5); Indication 8   Proximal LAD involvement M (6); Indication 14 A (7); Indication 14   newline 3 Vessel Disease  Low disease complexity (e.g., focal stenoses, SYNTAX <=22) M (6);  Indication 19 A (8); Indication 19   Intermediate or high disease complexity (e.g., SYNTAX >=23) M (5); Indication 23 A (8); Indication 23   newline Left Main Disease  Isolated LMCA disease: ostial or midshaft A (7); Indication 24 A (8); Indication 24   Isolated LMCA disease: bifurcation involvement M (5); Indication 25 A (8); Indication 25   LMCA ostial or midshaft, concurrent low disease burden multivessel disease (e.g., 1-2 additional focal stenoses, SYNTAX <=22) M (6); Indication 26 A (9); Indication 26   LMCA ostial or midshaft, concurrent intermediate or high disease burden multivessel disease (e.g., 1-2 additional bifurcation stenoses, long stenoses, SYNTAX >=23) M (4); Indication 27 A (9); Indication 27   LMCA bifurcation involvement, concurrent low disease burden multivessel disease (e.g., 1-2 additional focal stenoses, SYNTAX <=22) M (5); Indication 28 A (8); Indication 28   LMCA bifurcation involvement, concurrent intermediate or high disease burden multivessel disease (e.g., 1-2 additional bifurcation stenoses, long stenoses, SYNTAX >=23) R (3); Indication 29 A (9); Indication 29         Kiandra Sanguinetti J Jaksen Fiorella

## 2017-12-23 ENCOUNTER — Encounter (HOSPITAL_COMMUNITY): Payer: Self-pay | Admitting: Cardiology

## 2017-12-25 ENCOUNTER — Encounter (HOSPITAL_BASED_OUTPATIENT_CLINIC_OR_DEPARTMENT_OTHER): Payer: BLUE CROSS/BLUE SHIELD

## 2017-12-28 ENCOUNTER — Encounter (HOSPITAL_BASED_OUTPATIENT_CLINIC_OR_DEPARTMENT_OTHER): Payer: Self-pay

## 2017-12-28 ENCOUNTER — Encounter (HOSPITAL_BASED_OUTPATIENT_CLINIC_OR_DEPARTMENT_OTHER): Payer: BLUE CROSS/BLUE SHIELD

## 2018-01-04 ENCOUNTER — Encounter (HOSPITAL_BASED_OUTPATIENT_CLINIC_OR_DEPARTMENT_OTHER): Payer: BLUE CROSS/BLUE SHIELD

## 2018-01-05 ENCOUNTER — Encounter: Payer: Self-pay | Admitting: Neurology

## 2018-01-05 ENCOUNTER — Ambulatory Visit (HOSPITAL_BASED_OUTPATIENT_CLINIC_OR_DEPARTMENT_OTHER): Payer: BLUE CROSS/BLUE SHIELD | Attending: Internal Medicine | Admitting: Internal Medicine

## 2018-01-05 VITALS — Ht 73.0 in | Wt 340.0 lb

## 2018-01-05 DIAGNOSIS — G4733 Obstructive sleep apnea (adult) (pediatric): Secondary | ICD-10-CM | POA: Diagnosis not present

## 2018-01-05 DIAGNOSIS — R0683 Snoring: Secondary | ICD-10-CM | POA: Diagnosis present

## 2018-01-05 DIAGNOSIS — G4736 Sleep related hypoventilation in conditions classified elsewhere: Secondary | ICD-10-CM | POA: Insufficient documentation

## 2018-01-05 DIAGNOSIS — G4751 Confusional arousals: Secondary | ICD-10-CM | POA: Diagnosis present

## 2018-01-05 DIAGNOSIS — G4730 Sleep apnea, unspecified: Secondary | ICD-10-CM | POA: Diagnosis present

## 2018-01-07 ENCOUNTER — Encounter: Payer: BLUE CROSS/BLUE SHIELD | Admitting: Cardiothoracic Surgery

## 2018-01-09 DIAGNOSIS — G4733 Obstructive sleep apnea (adult) (pediatric): Secondary | ICD-10-CM | POA: Diagnosis not present

## 2018-01-09 NOTE — Procedures (Signed)
   Patient Name: Stanford ScotlandCook, [place] Study Date: 01/05/2018 Gender: Male D.O.B: December 22, 1970 Age (years): 46 Referring Provider: Andi DevonKimberly Shelton Height (inches): 73 Interpreting Physician: Jetty Duhamellinton Yeshaya Vath MD, ABSM Weight (lbs): 340 RPSGT: Rodanthe SinkBarksdale, [place] BMI: 45 MRN: [id number] Neck Size: 19.00 <br> <br> CLINICAL INFORMATION Sleep Study Type: HST Indication for sleep study: Fatigue, OSA, Snoring  Epworth Sleepiness Score: 16  SLEEP STUDY TECHNIQUE A multi-channel overnight portable sleep study was performed. The channels recorded were: nasal airflow, thoracic respiratory movement, and oxygen saturation with a pulse oximetry. Snoring was also monitored.  MEDICATIONS Patient self administered medications include: none reported.  SLEEP ARCHITECTURE Patient was studied for 480.5 minutes. The sleep efficiency was 100.0 % and the patient was supine for 26.2%. The arousal index was 0.0 per hour.  RESPIRATORY PARAMETERS The overall AHI was 25.7 per hour, with a central apnea index of 1.1 per hour. The oxygen nadir was 61% during sleep.  CARDIAC DATA Mean heart rate during sleep was 86.3 bpm.  IMPRESSIONS - Moderate obstructive sleep apnea occurred during this study (AHI = 25.7/h). - No significant central sleep apnea occurred during this study (CAI = 1.1/h). - Severe oxygen desaturation was noted during this study (Min O2 = 61%, Mean 93%). - Patient snored.  DIAGNOSIS - Obstructive Sleep Apnea (327.23 [G47.33 ICD-10]) - Nocturnal Hypoxemia (327.26 [G47.36 ICD-10])  RECOMMENDATIONS - Recommend CPAP titration or AutoPAP. Other options would be based on clinical judgment. - Be careful with alcohol, sedatives and other CNS depressants that may worsen sleep apnea and disrupt normal sleep architecture. - Sleep hygiene should be reviewed to assess factors that may improve sleep quality. - Weight management and regular exercise should be initiated or continued.  [Electronically signed]  01/09/2018 10:05 AM  Jetty Duhamellinton Kenniya Westrich MD, ABSM Diplomate, American Board of Sleep Medicine   NPI: [id number]                          Jetty Duhamellinton Ansen Sayegh Diplomate, American Board of Sleep Medicine  ELECTRONICALLY SIGNED ON:  01/09/2018, 10:03 AM [facility] PH: [phone]   FX: [phone] ACCREDITED BY THE AMERICAN ACADEMY OF SLEEP MEDICINE

## 2018-01-12 DIAGNOSIS — I255 Ischemic cardiomyopathy: Secondary | ICD-10-CM | POA: Insufficient documentation

## 2018-01-13 ENCOUNTER — Institutional Professional Consult (permissible substitution) (INDEPENDENT_AMBULATORY_CARE_PROVIDER_SITE_OTHER): Payer: BLUE CROSS/BLUE SHIELD | Admitting: Thoracic Surgery (Cardiothoracic Vascular Surgery)

## 2018-01-13 ENCOUNTER — Encounter: Payer: Self-pay | Admitting: Thoracic Surgery (Cardiothoracic Vascular Surgery)

## 2018-01-13 ENCOUNTER — Encounter: Payer: BLUE CROSS/BLUE SHIELD | Admitting: Cardiothoracic Surgery

## 2018-01-13 ENCOUNTER — Other Ambulatory Visit: Payer: Self-pay | Admitting: *Deleted

## 2018-01-13 VITALS — BP 150/78 | HR 92 | Resp 20 | Ht 73.0 in | Wt 345.0 lb

## 2018-01-13 DIAGNOSIS — I2510 Atherosclerotic heart disease of native coronary artery without angina pectoris: Secondary | ICD-10-CM | POA: Diagnosis not present

## 2018-01-13 NOTE — Progress Notes (Signed)
PCP is Andi Devon, MD Referring Provider is Elder Negus, MD  Chief Complaint  Patient presents with  . Coronary Artery Disease    Surgical eval for possible CABG, Cadiac Cath 12/22/17,ECHO 08/06/17     HPI: Mr. Hurrell is sent for consultation regarding left main and three-vessel coronary disease  Mr. Woelfel is a 47 year old man with a past medical history significant for stroke, type 2 diabetes without complication, hypertension, morbid obesity, coronary artery disease, and ischemic cardiomyopathy.  He was recently diagnosed with sleep apnea.  He had a stroke in February 2018.  He has some residual paresthesias in the left side of his face and his right forearm.  He does not have any motor deficit.  On December 18 he was short of breath and felt fatigued at work.  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  He also had peripheral edema.  He was having some sharp chest pains at that time but there were not felt to be cardiac in origin.  He was referred to Dr. Rosemary Holms.  A nuclear stress test showed a large scar with peri-infarct ischemia, ejection fraction was 40%.  He did have dyspnea and dizziness during the study.  He underwent cardiac catheterization on 12/22/2017.  He revealed a 70% left main stenosis.  There was an 80% stenosis and a diffusely diseased right coronary.  There was disease involving a large bifurcated ramus vessel and a 70% stenosis in the LAD.  His PDA was totally occluded and filled via collaterals.  He continues to have some chest tightness and shortness of breath with exertion.  He says this will come on if he walks about a block.  He has not had any rest or nocturnal symptoms.  Past Medical History:  Diagnosis Date  . CAD (coronary artery disease)   . Diabetes mellitus without complication (HCC)   . Hypertension   . Ischemic cardiomyopathy   . Morbid obesity (HCC)   . Stroke [facility])     Past Surgical History:  Procedure Laterality Date  . EXTERNAL  EAR SURGERY    . LEFT HEART CATH AND CORONARY ANGIOGRAPHY N/A 12/22/2017   Procedure: LEFT HEART CATH AND CORONARY ANGIOGRAPHY;  Surgeon: Elder Negus, MD;  Location: [facility];  Service: Cardiovascular;  Laterality: N/A;    Family History  Problem Relation Age of Onset  . Heart attack Father   . Gout Brother     Social History Social History   Tobacco Use  . Smoking status: Former Games developer  . Smokeless tobacco: Never Used  . Tobacco comment: quit 10/2016  Substance Use Topics  . Alcohol use: Yes    Comment: seldom  . Drug use: Yes    Types: Marijuana    Comment: daily    Current Outpatient Medications  Medication Sig Dispense Refill  . aspirin EC 81 MG tablet Take 81 mg by mouth daily.     [name] atorvastatin (LIPITOR) 40 MG tablet Take 40 mg by mouth daily.    . Azilsartan Medoxomil (EDARBI) 80 MG TABS Take 80 mg by mouth daily.    . clopidogrel (PLAVIX) 75 MG tablet Take 75 mg by mouth daily.    . Coenzyme Q10 (CO Q-10) 400 MG CAPS Take 1 tablet by mouth 2 (two) times daily.     . furosemide (LASIX) 20 MG tablet Take 20 mg by mouth daily.    . metFORMIN (GLUCOPHAGE-XR) 500 MG 24 hr tablet Take 500 mg by mouth 2 (  two) times daily.    . metoprolol tartrate (LOPRESSOR) 25 MG tablet Take 1 tablet (25 mg total) by mouth 2 (two) times daily. 60 tablet 1  . montelukast (SINGULAIR) 10 MG tablet Take 10 mg by mouth at bedtime as needed.    . Probiotic Product (PROBIOTIC DAILY PO) Take 1 capsule by mouth 2 (two) times daily.    . traMADol (ULTRAM) 50 MG tablet Take 150 mg by mouth at bedtime.    . Turmeric 500 MG CAPS Take 1 tablet by mouth 2 (two) times daily.     No current facility-administered medications for this visit.     Allergies  Allergen Reactions  . Bee Venom Anaphylaxis and Swelling  . Penicillins     Swelling in feet Has patient had a PCN reaction causing immediate rash, facial/tongue/throat swelling, SOB or lightheadedness with hypotension: No Has  patient had a PCN reaction causing severe rash involving mucus membranes or skin necrosis: No Has patient had a PCN reaction that required hospitalization: No Has patient had a PCN reaction occurring within the last 10 years: No If all of the above answers are "NO", then may proceed with Cephalosporin use.  . Pollen Extract     Review of Systems  Constitutional: Positive for activity change. Negative for appetite change and unexpected weight change.  HENT: Negative for trouble swallowing and voice change.   Eyes: Negative for visual disturbance.  Respiratory: Positive for chest tightness and shortness of breath. Negative for cough.   Cardiovascular: Positive for chest pain and leg swelling.  Genitourinary: Negative for difficulty urinating and dysuria.  Musculoskeletal: Negative for arthralgias and myalgias.  Neurological: Positive for dizziness, syncope and numbness.       Paresthesias left face and right forearm  Psychiatric/Behavioral: The patient is nervous/anxious.     BP (!) 150/78   Pulse 92   Resp 20   Ht 6\' 1"  (1.854 m)   Wt (!) 345 lb (156.5 kg)   SpO2 99% Comment: RA  BMI 45.52 kg/m  Physical Exam  Constitutional: He is oriented to person, place, and time. No distress.  Morbidly obese  HENT:  Head: Normocephalic and atraumatic.  Mouth/Throat: No oropharyngeal exudate.  Eyes: Conjunctivae and EOM are normal. Pupils are equal, round, and reactive to light. No scleral icterus.  Neck: Neck supple. No thyromegaly present.  Cardiovascular: Normal rate, regular rhythm and intact distal pulses. Exam reveals gallop (S4).  No murmur heard. Normal Allen's test on right  Pulmonary/Chest: Effort normal. No respiratory distress. He has no wheezes. He has no rales.  Diminished breath sounds bilaterally  Abdominal: Soft. He exhibits no distension. There is no tenderness.  Musculoskeletal: He exhibits no edema or deformity.  Lymphadenopathy:    He has no cervical adenopathy.   Neurological: He is alert and oriented to person, place, and time. No cranial nerve deficit. He exhibits normal muscle tone.  Skin: Skin is warm and dry.  Psychiatric:  Very anxious  Vitals reviewed.    Diagnostic Tests: Echocardiogram 08/06/2017  Conclusions 1.  Poor echo window.  Wall motion abnormality is reduced sensitivity.  Left ventricular cavity is normal in size.  Moderate concentric hypertrophy of the left ventricle.  Mild decrease in LV systolic function.  Visual EF 45% with global hypokinesis.  Calculated EF 42%.  Doppler evidence of grade 2 (pseudo-normal) diastolic dysfunction.  Trace tricuspid regurgitation.  Unable to estimate PA pressure due to absence/minimal TR signal. 2.  IVC is dilated with poor inspiration collapse consistent  with elevated right atrial pressure. Jeanella CaraJ Ganji MD  Nuclear cardiology report 12/04/2017 Impression 1.  The resting electric cardiogram demonstrated normal sinus rhythm, normal resting conduction, and nonspecific ST-T changes.  Stress ECG is nondiagnostic for ischemia as it is a pharmacologic stress using Lexiscan.  Resting blood pressure was 160/100 mmHg and peak effect blood pressure was 152/92 mmHg.  Stress symptoms included dyspnea, dizziness. 2.  Left ventricular cavity is noted to be enlarged on the rest and stress studies with LVEDV of 260 mL.  SPECT images demonstrate large perfusion abnormality of severe intensity in the basal inferior, mid inferior, apical anterior, and apical inferior myocardial walls on the stress images.  The defect is present on the resting images consistent with prior myocardial infarction.  There is very minimal peri-infarct ischemia.  The left ventricular ejection fraction was Would be 39% with global hypokinesis.  This is a high risk study, clinical correlation is recommended in view of BMI 44.  Cardiac catheterization 12/22/2017 Conclusion     Mid RCA lesion is 80% stenosed.  Mid LM to Dist LM lesion is 70%  stenosed.  Ost Ramus lesion is 75% stenosed with 75% stenosed side branch in Lat Ramus.  Ost LAD to Prox LAD lesion is 70% stenosed.   Severe multivessel coronary artery disease Elevated LVEDP  Recommendation: Aggressive risk factor modification, especially blood pressure and diabetes control. Will refer him for outpatient evaluation for CABG  Elder NegusManish J Patwardhan, MD   I personally reviewed the catheterization images and concur with the findings noted above  Impression: [name] is a 47 year old gentleman with multiple cardiac risk factors including tobacco use, type 2 diabetes, hypertension, dyslipidemia, obesity, and a strong family history of coronary disease.  His father died of an MI at age 49.  He is understandably anxious due to that history.  He presented back in December with a heart failure.  There was manifested by shortness of breath, fatigue and peripheral edema.  His symptoms have improved to some degree with medical management.  He had a high risk nuclear stress study.  Cardiac catheterization revealed left main and three-vessel coronary disease.  He had an elevated left ventricular end-diastolic pressure.  His ejection fraction is approximately 40%.  Coronary artery bypass grafting is indicated for survival benefit and relief of symptoms.  An echocardiogram from September 2018 showed no evidence of mitral regurgitation.  I advised [name] to undergo coronary artery bypass grafting for survival benefit and relief of symptoms.  He understands the importance of ongoing medical therapy.  He understands that CABG is palliative.  I discussed the general nature of the procedure with him including the need for general anesthesia, the use of cardiac coronary bypass, the use of drainage tubes postoperatively, the expected hospital stay, and the overall recovery.  I informed him of the indications, risks, benefits, and alternatives.  He understands the risks include, but not limited  to death, MI, DVT, PE, stroke, bleeding, possible need for transfusion, infection, cardiac arrhythmias, respiratory or renal failure, gastrointestinal complications, as well as possibility of other unforeseeable complications.  Morbid obesity-has lost about 40 pounds through diet.  Needs to continue  Type 2 diabetes-poorly controlled.  His blood sugars have improved with the increase in metformin dose by Dr. Rosemary HolmsPatwardhan.  Prior stroke-still has paresthesias.  Takes tramadol for pain.  He is on Plavix.  He will need to hold that for 5 days prior to surgery.  No significant carotid disease by duplex  Plan: Stop Plavix  after dose on Sunday, 01/17/2018  Coronary artery bypass grafting with right radial artery harvest and endoscopic vein harvest on Friday, 01/30/2018  Loreli Slot, MD [facility] [phone]

## 2018-01-13 NOTE — H&P (View-Only) (Signed)
PCP is Andi Devon, MD Referring Provider is Elder Negus, MD  Chief Complaint  Patient presents with  . Coronary Artery Disease    Surgical eval for possible CABG, Cadiac Cath 12/22/17,ECHO 08/06/17     HPI: Nicholas Mejia is sent for consultation regarding left main and three-vessel coronary disease  Nicholas Mejia is a 47 year old man with a past medical history significant for stroke, type 2 diabetes without complication, hypertension, morbid obesity, coronary artery disease, and ischemic cardiomyopathy.  He was recently diagnosed with sleep apnea.  He had a stroke in February 2018.  He has some residual paresthesias in the left side of his face and his right forearm.  He does not have any motor deficit.  On December 18 he was short of breath and felt fatigued at work.  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  He also had peripheral edema.  He was having some sharp chest pains at that time but there were not felt to be cardiac in origin.  He was referred to Dr. Rosemary Holms.  A nuclear stress test showed a large scar with peri-infarct ischemia, ejection fraction was 40%.  He did have dyspnea and dizziness during the study.  He underwent cardiac catheterization on 12/22/2017.  He revealed a 70% left main stenosis.  There was an 80% stenosis and a diffusely diseased right coronary.  There was disease involving a large bifurcated ramus vessel and a 70% stenosis in the LAD.  His PDA was totally occluded and filled via collaterals.  He continues to have some chest tightness and shortness of breath with exertion.  He says this will come on if he walks about a block.  He has not had any rest or nocturnal symptoms.  Past Medical History:  Diagnosis Date  . CAD (coronary artery disease)   . Diabetes mellitus without complication (HCC)   . Hypertension   . Ischemic cardiomyopathy   . Morbid obesity (HCC)   . Stroke [facility])     Past Surgical History:  Procedure Laterality Date  . EXTERNAL  EAR SURGERY    . LEFT HEART CATH AND CORONARY ANGIOGRAPHY N/A 12/22/2017   Procedure: LEFT HEART CATH AND CORONARY ANGIOGRAPHY;  Surgeon: Elder Negus, MD;  Location: [facility]VAS[facility] LAB;  Service: Cardiovascular;  Laterality: N/A;    Family History  Problem Relation Age of Onset  . Heart attack Father   . Gout Brother     Social History Social History   Tobacco Use  . Smoking status: Former Games developer  . Smokeless tobacco: Never Used  . Tobacco comment: quit 10/2016  Substance Use Topics  . Alcohol use: Yes    Comment: seldom  . Drug use: Yes    Types: Marijuana    Comment: daily    Current Outpatient Medications  Medication Sig Dispense Refill  . aspirin EC 81 MG tablet Take 81 mg by mouth daily.     [name] atorvastatin (LIPITOR) 40 MG tablet Take 40 mg by mouth daily.    . Azilsartan Medoxomil (EDARBI) 80 MG TABS Take 80 mg by mouth daily.    . clopidogrel (PLAVIX) 75 MG tablet Take 75 mg by mouth daily.    . Coenzyme Q10 (CO Q-10) 400 MG CAPS Take 1 tablet by mouth 2 (two) times daily.     . furosemide (LASIX) 20 MG tablet Take 20 mg by mouth daily.    . metFORMIN (GLUCOPHAGE-XR) 500 MG 24 hr tablet Take 500 mg by mouth 2 (  two) times daily.    . metoprolol tartrate (LOPRESSOR) 25 MG tablet Take 1 tablet (25 mg total) by mouth 2 (two) times daily. 60 tablet 1  . montelukast (SINGULAIR) 10 MG tablet Take 10 mg by mouth at bedtime as needed.    . Probiotic Product (PROBIOTIC DAILY PO) Take 1 capsule by mouth 2 (two) times daily.    . traMADol (ULTRAM) 50 MG tablet Take 150 mg by mouth at bedtime.    . Turmeric 500 MG CAPS Take 1 tablet by mouth 2 (two) times daily.     No current facility-administered medications for this visit.     Allergies  Allergen Reactions  . Bee Venom Anaphylaxis and Swelling  . Penicillins     Swelling in feet Has patient had a PCN reaction causing immediate rash, facial/tongue/throat swelling, SOB or lightheadedness with hypotension: No Has  patient had a PCN reaction causing severe rash involving mucus membranes or skin necrosis: No Has patient had a PCN reaction that required hospitalization: No Has patient had a PCN reaction occurring within the last 10 years: No If all of the above answers are "NO", then may proceed with Cephalosporin use.  . Pollen Extract     Review of Systems  Constitutional: Positive for activity change. Negative for appetite change and unexpected weight change.  HENT: Negative for trouble swallowing and voice change.   Eyes: Negative for visual disturbance.  Respiratory: Positive for chest tightness and shortness of breath. Negative for cough.   Cardiovascular: Positive for chest pain and leg swelling.  Genitourinary: Negative for difficulty urinating and dysuria.  Musculoskeletal: Negative for arthralgias and myalgias.  Neurological: Positive for dizziness, syncope and numbness.       Paresthesias left face and right forearm  Psychiatric/Behavioral: The patient is nervous/anxious.     BP (!) 150/78   Pulse 92   Resp 20   Ht 6\' 1"  (1.854 m)   Wt (!) 345 lb (156.5 kg)   SpO2 99% Comment: RA  BMI 45.52 kg/m  Physical Exam  Constitutional: He is oriented to person, place, and time. No distress.  Morbidly obese  HENT:  Head: Normocephalic and atraumatic.  Mouth/Throat: No oropharyngeal exudate.  Eyes: Conjunctivae and EOM are normal. Pupils are equal, round, and reactive to light. No scleral icterus.  Neck: Neck supple. No thyromegaly present.  Cardiovascular: Normal rate, regular rhythm and intact distal pulses. Exam reveals gallop (S4).  No murmur heard. Normal Allen's test on right  Pulmonary/Chest: Effort normal. No respiratory distress. He has no wheezes. He has no rales.  Diminished breath sounds bilaterally  Abdominal: Soft. He exhibits no distension. There is no tenderness.  Musculoskeletal: He exhibits no edema or deformity.  Lymphadenopathy:    He has no cervical adenopathy.   Neurological: He is alert and oriented to person, place, and time. No cranial nerve deficit. He exhibits normal muscle tone.  Skin: Skin is warm and dry.  Psychiatric:  Very anxious  Vitals reviewed.    Diagnostic Tests: Echocardiogram 08/06/2017  Conclusions 1.  Poor echo window.  Wall motion abnormality is reduced sensitivity.  Left ventricular cavity is normal in size.  Moderate concentric hypertrophy of the left ventricle.  Mild decrease in LV systolic function.  Visual EF 45% with global hypokinesis.  Calculated EF 42%.  Doppler evidence of grade 2 (pseudo-normal) diastolic dysfunction.  Trace tricuspid regurgitation.  Unable to estimate PA pressure due to absence/minimal TR signal. 2.  IVC is dilated with poor inspiration collapse consistent  with elevated right atrial pressure. Jeanella CaraJ Ganji MD  Nuclear cardiology report 12/04/2017 Impression 1.  The resting electric cardiogram demonstrated normal sinus rhythm, normal resting conduction, and nonspecific ST-T changes.  Stress ECG is nondiagnostic for ischemia as it is a pharmacologic stress using Lexiscan.  Resting blood pressure was 160/100 mmHg and peak effect blood pressure was 152/92 mmHg.  Stress symptoms included dyspnea, dizziness. 2.  Left ventricular cavity is noted to be enlarged on the rest and stress studies with LVEDV of 260 mL.  SPECT images demonstrate large perfusion abnormality of severe intensity in the basal inferior, mid inferior, apical anterior, and apical inferior myocardial walls on the stress images.  The defect is present on the resting images consistent with prior myocardial infarction.  There is very minimal peri-infarct ischemia.  The left ventricular ejection fraction was Would be 39% with global hypokinesis.  This is a high risk study, clinical correlation is recommended in view of BMI 44.  Cardiac catheterization 12/22/2017 Conclusion     Mid RCA lesion is 80% stenosed.  Mid LM to Dist LM lesion is 70%  stenosed.  Ost Ramus lesion is 75% stenosed with 75% stenosed side branch in Lat Ramus.  Ost LAD to Prox LAD lesion is 70% stenosed.   Severe multivessel coronary artery disease Elevated LVEDP  Recommendation: Aggressive risk factor modification, especially blood pressure and diabetes control. Will refer him for outpatient evaluation for CABG  Elder NegusManish J Patwardhan, MD   I personally reviewed the catheterization images and concur with the findings noted above  Impression: [name] is a 47 year old gentleman with multiple cardiac risk factors including tobacco use, type 2 diabetes, hypertension, dyslipidemia, obesity, and a strong family history of coronary disease.  His father died of an MI at age 49.  He is understandably anxious due to that history.  He presented back in December with a heart failure.  There was manifested by shortness of breath, fatigue and peripheral edema.  His symptoms have improved to some degree with medical management.  He had a high risk nuclear stress study.  Cardiac catheterization revealed left main and three-vessel coronary disease.  He had an elevated left ventricular end-diastolic pressure.  His ejection fraction is approximately 40%.  Coronary artery bypass grafting is indicated for survival benefit and relief of symptoms.  An echocardiogram from September 2018 showed no evidence of mitral regurgitation.  I advised [name] to undergo coronary artery bypass grafting for survival benefit and relief of symptoms.  He understands the importance of ongoing medical therapy.  He understands that CABG is palliative.  I discussed the general nature of the procedure with him including the need for general anesthesia, the use of cardiac coronary bypass, the use of drainage tubes postoperatively, the expected hospital stay, and the overall recovery.  I informed him of the indications, risks, benefits, and alternatives.  He understands the risks include, but not limited  to death, MI, DVT, PE, stroke, bleeding, possible need for transfusion, infection, cardiac arrhythmias, respiratory or renal failure, gastrointestinal complications, as well as possibility of other unforeseeable complications.  Morbid obesity-has lost about 40 pounds through diet.  Needs to continue  Type 2 diabetes-poorly controlled.  His blood sugars have improved with the increase in metformin dose by Dr. Rosemary HolmsPatwardhan.  Prior stroke-still has paresthesias.  Takes tramadol for pain.  He is on Plavix.  He will need to hold that for 5 days prior to surgery.  No significant carotid disease by duplex  Plan: Stop Plavix  after dose on Sunday, 01/17/2018  Coronary artery bypass grafting with right radial artery harvest and endoscopic vein harvest on Friday, 02/12/2018  Loreli Slot, MD [facility] [phone]

## 2018-01-13 NOTE — Patient Instructions (Signed)
Stop plavix after your dose on Sunday 2/24

## 2018-01-14 ENCOUNTER — Encounter: Payer: Self-pay | Admitting: Cardiology

## 2018-01-19 NOTE — Pre-Procedure Instructions (Signed)
Nicholas Mejia  01/19/2018      [id number] - [place], [place] - [address] [place] [address] Phone: [phone] Fax: [phone]    Your procedure is scheduled on Fri. March 1  Report to [facility] Admitting at 5:30 A.M.  Call this number if you have problems the morning of surgery:  [phone]   Remember:  Do not eat food or drink liquids after midnight on Thurs. Feb. 28    Take these medicines the morning of surgery with A SIP OF WATER : isosorbide-hydralazine (bidil), tramadol if needed              7 days prior to surgery STOP taking any Aspirin(unless otherwise instructed by your surgeon), Aleve, Naproxen, Ibuprofen, Motrin, Advil, Goody's, BC's, all herbal medications, fish oil, and all vitamins                STOP ASPIRIN AND PLAVIX PER DR. HENDRICKSON                   How to Manage Your Diabetes Before and After Surgery  Why is it important to control my blood sugar before and after surgery? . Improving blood sugar levels before and after surgery helps healing and can limit problems. . A way of improving blood sugar control is eating a healthy diet by: o  Eating less sugar and carbohydrates o  Increasing activity/exercise o  Talking with your doctor about reaching your blood sugar goals . High blood sugars (greater than 180 mg/dL) can raise your risk of infections and slow your recovery, so you will need to focus on controlling your diabetes during the weeks before surgery. . Make sure that the doctor who takes care of your diabetes knows about your planned surgery including the date and location.  How do I manage my blood sugar before surgery? . Check your blood sugar at least 4 times a day, starting 2 days before surgery, to make sure that the level is not too high or low. o Check your blood sugar the morning of your surgery when you wake up and every 2 hours until you get to the Short Stay unit. . If your blood sugar  is less than 70 mg/dL, you will need to treat for low blood sugar: o Do not take insulin. o Treat a low blood sugar (less than 70 mg/dL) with  cup of clear juice (cranberry or apple), 4 glucose tablets, OR glucose gel. Recheck blood sugar in 15 minutes after treatment (to make sure it is greater than 70 mg/dL). If your blood sugar is not greater than 70 mg/dL on recheck, call [phone] o  for further instructions. . Report your blood sugar to the short stay nurse when you get to Short Stay.  . If you are admitted to the hospital after surgery: o Your blood sugar will be checked by the staff and you will probably be given insulin after surgery (instead of oral diabetes medicines) to make sure you have good blood sugar levels. o The goal for blood sugar control after surgery is 80-180 mg/dL.       WHAT DO I DO ABOUT MY DIABETES MEDICATION?   [name] Do not take oral diabetes medicines (pills) the morning of surgery.       Do not wear jewelry, make-up or nail polish.  Do not wear lotions, powders, or perfumes, or deodorant.  Do not shave 48 hours prior to surgery.  Men may shave face and neck.  Do not bring valuables to the hospital.  [facility] is not responsible for any belongings or valuables.  Contacts, dentures or bridgework may not be worn into surgery.  Leave your suitcase in the car.  After surgery it may be brought to your room.  For patients admitted to the hospital, discharge time will be determined by your treatment team.  Patients discharged the day of surgery will not be allowed to drive home.    Special instructions:  [place]- Preparing For Surgery  Before surgery, you can play an important role. Because skin is not sterile, your skin needs to be as free of germs as possible. You can reduce the number of germs on your skin by washing with CHG (chlorahexidine gluconate) Soap before surgery.  CHG is an antiseptic cleaner which kills germs and bonds with the skin to  continue killing germs even after washing.  Please do not use if you have an allergy to CHG or antibacterial soaps. If your skin becomes reddened/irritated stop using the CHG.  Do not shave (including legs and underarms) for at least 48 hours prior to first CHG shower. It is OK to shave your face.  Please follow these instructions carefully.   1. Shower the NIGHT BEFORE SURGERY and the MORNING OF SURGERY with CHG.   2. If you chose to wash your hair, wash your hair first as usual with your normal shampoo.  3. After you shampoo, rinse your hair and body thoroughly to remove the shampoo.  4. Use CHG as you would any other liquid soap. You can apply CHG directly to the skin and wash gently with a scrungie or a clean washcloth.   5. Apply the CHG Soap to your body ONLY FROM THE NECK DOWN.  Do not use on open wounds or open sores. Avoid contact with your eyes, ears, mouth and genitals (private parts). Wash Face and genitals (private parts)  with your normal soap.  6. Wash thoroughly, paying special attention to the area where your surgery will be performed.  7. Thoroughly rinse your body with warm water from the neck down.  8. DO NOT shower/wash with your normal soap after using and rinsing off the CHG Soap.  9. Pat yourself dry with a CLEAN TOWEL.  10. Wear CLEAN PAJAMAS to bed the night before surgery, wear comfortable clothes the morning of surgery  11. Place CLEAN SHEETS on your bed the night of your first shower and DO NOT SLEEP WITH PETS.    Day of Surgery: Do not apply any deodorants/lotions. Please wear clean clothes to the hospital/surgery center.      Please read over the following fact sheets that you were given. Coughing and Deep Breathing, MRSA Information and Surgical Site Infection Prevention

## 2018-01-20 ENCOUNTER — Ambulatory Visit (HOSPITAL_COMMUNITY)
Admission: RE | Admit: 2018-01-20 | Discharge: 2018-01-20 | Disposition: A | Discharge status: Home / Self Care | Payer: BLUE CROSS/BLUE SHIELD | Source: Ambulatory Visit | Attending: Thoracic Surgery (Cardiothoracic Vascular Surgery) | Admitting: Thoracic Surgery (Cardiothoracic Vascular Surgery)

## 2018-01-20 ENCOUNTER — Other Ambulatory Visit: Payer: Self-pay

## 2018-01-20 ENCOUNTER — Encounter (HOSPITAL_COMMUNITY): Payer: Self-pay

## 2018-01-20 ENCOUNTER — Encounter (HOSPITAL_COMMUNITY)
Admission: RE | Admit: 2018-01-20 | Discharge: 2018-01-20 | Disposition: A | Discharge status: Home / Self Care | Payer: BLUE CROSS/BLUE SHIELD | Source: Ambulatory Visit | Attending: Thoracic Surgery (Cardiothoracic Vascular Surgery) | Admitting: Thoracic Surgery (Cardiothoracic Vascular Surgery)

## 2018-01-20 DIAGNOSIS — I2510 Atherosclerotic heart disease of native coronary artery without angina pectoris: Secondary | ICD-10-CM | POA: Insufficient documentation

## 2018-01-20 DIAGNOSIS — I517 Cardiomegaly: Secondary | ICD-10-CM | POA: Diagnosis not present

## 2018-01-20 DIAGNOSIS — Z01812 Encounter for preprocedural laboratory examination: Secondary | ICD-10-CM | POA: Insufficient documentation

## 2018-01-20 DIAGNOSIS — Z01810 Encounter for preprocedural cardiovascular examination: Secondary | ICD-10-CM | POA: Diagnosis present

## 2018-01-20 HISTORY — DX: Pneumonia, unspecified organism: J18.9

## 2018-01-20 HISTORY — DX: Gastro-esophageal reflux disease without esophagitis: K21.9

## 2018-01-20 HISTORY — DX: Adverse effect of unspecified anesthetic, initial encounter: T41.45XA

## 2018-01-20 HISTORY — DX: Sleep apnea, unspecified: G47.30

## 2018-01-20 HISTORY — DX: Dyspnea, unspecified: R06.00

## 2018-01-20 HISTORY — DX: Other complications of anesthesia, initial encounter: T88.59XA

## 2018-01-20 LAB — URINALYSIS, ROUTINE W REFLEX MICROSCOPIC
Bilirubin Urine: NEGATIVE
Glucose, UA: NEGATIVE mg/dL
Hgb urine dipstick: NEGATIVE
Ketones, ur: NEGATIVE mg/dL
LEUKOCYTES UA: NEGATIVE
NITRITE: NEGATIVE
PH: 5 (ref 5.0–8.0)
Protein, ur: NEGATIVE mg/dL
SPECIFIC GRAVITY, URINE: 1.01 (ref 1.005–1.030)

## 2018-01-20 LAB — BLOOD GAS, ARTERIAL
Acid-Base Excess: 2.2 mmol/L — ABNORMAL HIGH (ref 0.0–2.0)
BICARBONATE: 26.5 mmol/L (ref 20.0–28.0)
Drawn by: 449841
FIO2: 21
O2 SAT: 98.5 %
PCO2 ART: 43.1 mmHg (ref 32.0–48.0)
PO2 ART: 119 mmHg — AB (ref 83.0–108.0)
Patient temperature: 98.6
pH, Arterial: 7.406 (ref 7.350–7.450)

## 2018-01-20 LAB — COMPREHENSIVE METABOLIC PANEL
ALK PHOS: 61 U/L (ref 38–126)
ALT: 15 U/L — ABNORMAL LOW (ref 17–63)
AST: 18 U/L (ref 15–41)
Albumin: 3.7 g/dL (ref 3.5–5.0)
Anion gap: 12 (ref 5–15)
BILIRUBIN TOTAL: 0.6 mg/dL (ref 0.3–1.2)
BUN: 20 mg/dL (ref 6–20)
CALCIUM: 8.9 mg/dL (ref 8.9–10.3)
CO2: 21 mmol/L — ABNORMAL LOW (ref 22–32)
Chloride: 97 mmol/L — ABNORMAL LOW (ref 101–111)
Creatinine, Ser: 1.28 mg/dL — ABNORMAL HIGH (ref 0.61–1.24)
GFR calc non Af Amer: >60 mL/min (ref >60)
Glucose, Bld: 149 mg/dL — ABNORMAL HIGH (ref 65–99)
POTASSIUM: 4.3 mmol/L (ref 3.5–5.1)
Sodium: 130 mmol/L — ABNORMAL LOW (ref 135–145)
TOTAL PROTEIN: 7.9 g/dL (ref 6.5–8.1)

## 2018-01-20 LAB — SURGICAL PCR SCREEN
MRSA, PCR: NEGATIVE
Staphylococcus aureus: NEGATIVE

## 2018-01-20 LAB — CBC
HEMATOCRIT: 36.4 % — AB (ref 39.0–52.0)
HEMOGLOBIN: 11.7 g/dL — AB (ref 13.0–17.0)
MCH: 28.5 pg (ref 26.0–34.0)
MCHC: 32.1 g/dL (ref 30.0–36.0)
MCV: 88.8 fL (ref 78.0–100.0)
Platelets: 300 10*3/uL (ref 150–400)
RBC: 4.1 MIL/uL — ABNORMAL LOW (ref 4.22–5.81)
RDW: 14.5 % (ref 11.5–15.5)
WBC: 12 10*3/uL — ABNORMAL HIGH (ref 4.0–10.5)

## 2018-01-20 LAB — PULMONARY FUNCTION TEST
DL/VA % PRED: 95 %
DL/VA: 4.63 ml/min/mmHg/L
DLCO UNC % PRED: 53 %
DLCO unc: 20.16 ml/min/mmHg
FEF 25-75 PRE: 2.43 L/s
FEF2575-%PRED-PRE: 64 %
FEV1-%Pred-Pre: 61 %
FEV1-PRE: 2.37 L
FEV1FVC-%Pred-Pre: 102 %
FEV6-%Pred-Pre: 60 %
FEV6-Pre: 2.84 L
FEV6FVC-%PRED-PRE: 101 %
FVC-%Pred-Pre: 59 %
FVC-Pre: 2.9 L
Pre FEV1/FVC ratio: 82 %
Pre FEV6/FVC Ratio: 99 %
RV % PRED: 74 %
RV: 1.64 L
TLC % pred: 61 %
TLC: 4.79 L

## 2018-01-20 LAB — ABO/RH: ABO/RH(D): A POS

## 2018-01-20 LAB — GLUCOSE, CAPILLARY: Glucose-Capillary: 149 mg/dL — ABNORMAL HIGH (ref 65–99)

## 2018-01-20 LAB — HEMOGLOBIN A1C
HEMOGLOBIN A1C: 8.1 % — AB (ref 4.8–5.6)
MEAN PLASMA GLUCOSE: 185.77 mg/dL

## 2018-01-20 LAB — PROTIME-INR
INR: 0.99
PROTHROMBIN TIME: 13 s (ref 11.4–15.2)

## 2018-01-20 LAB — APTT: aPTT: 31 seconds (ref 24–36)

## 2018-01-20 MED ORDER — ALBUTEROL SULFATE (2.5 MG/3ML) 0.083% IN NEBU
2.5000 mg | INHALATION_SOLUTION | Freq: Once | RESPIRATORY_TRACT | Status: AC
  Administered 2018-01-20: 2.5 mg via RESPIRATORY_TRACT

## 2018-01-20 NOTE — Progress Notes (Signed)
Pre CABG Dopplers completed. Bilateral 1% to 39% ICA stenosis. Vertebral artery flow is antergade. Rt ABI wnl Lt is a moderate reduction. Bilateral TBIs are abnormal, Palmar arch is normal bilaterally. [employer] ElectricVirginia Marlon Vonruden, RVS 01/20/2018, 4:06 PM

## 2018-01-20 NOTE — Progress Notes (Addendum)
PCP: Andi DevonKimberly Shelton Sleep MD: Fannie Kneelint Young Cardiologist: Dr. Rosemary HolmsPatwardhan Neurologist: Dr. Gwynn BurlySethi/Carolyn Martin, --last visit 03/2017, pt. Reports he was released from them and to follow up with his PCP.  Spoke with Ryan,@ Dr. Sunday CornHendrickson's office concerning aspirin. Stated to continue it but not to take the day of surgery. Information given to pt.   Pt. Stopped plavix 01/17/18 per Dr. Dorris FetchHendrickson  Fasting sugars 165 DR. Renae GlossShelton (PCP) manages his diabetes as well.

## 2018-01-20 NOTE — Progress Notes (Signed)
We were notified by Respiratory Therapy to come and see a patient in their PFT lab.  When WESCO InternationalDebbie Mejia and I  arrived in the lab, [name] was sitting up in the PFT lab bent over the trash can spitting emesis.  He was alert, able to answer questions, pale and diaphorietic.  His pulse was regular and in the 70s. He stated he felt weak but denied CP, SOB and any other complaints. When answering some questions, [name] stated he was on Metformin which he took this morning and he felt like he needed something to eat.  He stated he only had two boiled eggs early this morning. He confirmed that he feels like it does when his blood sugar is low.  After tolerating some ice water, we provided [name] with some oranges and crackers.  He stated no one was with him and he drove himself to the hospital.  We suggested that he should go to the ED if he was still feeling bad or if he started feeling worse.  He declined and stated that he was starting to feel a little better and he wanted something else to eat.  We directed him to call someone to be with him and instructed him not to drive himself home.  [name] used his cell phone to call his mother to come to the hospital.  He stated she was coming and she would be here in just over an hour as [place] is coming from [place].  After some food, [name]'s skin color improved, skin was warm and dry, his mentation was clear and he stated he felt a little better.  He was due to have another test in the vascular department.  We provided a box lunch from another department and he was assisted to the vascular department waiting area.  He stated he was feeling better and he was instructed to tell any staff member for help if he started feeling any worse.

## 2018-01-20 NOTE — Progress Notes (Signed)
Pt came in today for PFT for pre surgery testing. FULL PFT was done & Albuterol neb given. Following Albuterol pt began to feel bad & eventually threw up. Prior to vomiting I checked His SpO2-98% & HR-78. Called Rapid Response RN to come in & look at him. Pt was very cool & clammy. He said he felt like he needed something to eat. RR RN gave him some oranges & some water. He has another appointment today with Vascular, but not until 3:00. He said he wanted to go down there to see if he could be done early & if not he would just wait. RR RN went down there with him & got him some more food to eat while he waited. Called Ryan RN with [facility] to inform of everything that happened.  Jacqulynn CadetHopper, Telitha Plath David RRT

## 2018-01-21 MED ORDER — EPINEPHRINE PF 1 MG/ML IJ SOLN
0.0000 ug/min | INTRAVENOUS | Status: DC
  Filled 2018-01-21: qty 4

## 2018-01-21 MED ORDER — INSULIN REGULAR HUMAN 100 UNIT/ML IJ SOLN
INTRAMUSCULAR | Status: AC
  Administered 2018-01-22: 1.8 [IU]/h via INTRAVENOUS
  Filled 2018-01-21: qty 1

## 2018-01-21 MED ORDER — NITROGLYCERIN IN D5W 200-5 MCG/ML-% IV SOLN
2.0000 ug/min | INTRAVENOUS | Status: AC
  Administered 2018-01-22: 16.67 ug/min via INTRAVENOUS
  Filled 2018-01-21: qty 250

## 2018-01-21 MED ORDER — POTASSIUM CHLORIDE 2 MEQ/ML IV SOLN
80.0000 meq | INTRAVENOUS | Status: DC
  Filled 2018-01-21: qty 40

## 2018-01-21 MED ORDER — SODIUM CHLORIDE 0.9 % IV SOLN
30.0000 ug/min | INTRAVENOUS | Status: AC
  Administered 2018-01-22: 20 ug/min via INTRAVENOUS
  Filled 2018-01-21: qty 2

## 2018-01-21 MED ORDER — CHLORHEXIDINE GLUCONATE 0.12 % MT SOLN
15.0000 mL | Freq: Once | OROMUCOSAL | Status: AC
  Administered 2018-01-22: 15 mL via OROMUCOSAL
  Filled 2018-01-21: qty 15

## 2018-01-21 MED ORDER — MILRINONE LACTATE IN DEXTROSE 20-5 MG/100ML-% IV SOLN
0.1250 ug/kg/min | INTRAVENOUS | Status: DC
  Filled 2018-01-21: qty 100

## 2018-01-21 MED ORDER — METOPROLOL TARTRATE 12.5 MG HALF TABLET
12.5000 mg | ORAL_TABLET | Freq: Once | ORAL | Status: DC
  Filled 2018-01-21: qty 1

## 2018-01-21 MED ORDER — TRANEXAMIC ACID (OHS) BOLUS VIA INFUSION
15.0000 mg/kg | INTRAVENOUS | Status: AC
  Administered 2018-01-22: 2280 mg via INTRAVENOUS
  Filled 2018-01-21: qty 2280

## 2018-01-21 MED ORDER — HEPARIN SODIUM (PORCINE) 1000 UNIT/ML IJ SOLN
INTRAMUSCULAR | Status: DC
  Filled 2018-01-21: qty 30

## 2018-01-21 MED ORDER — LEVOFLOXACIN IN D5W 500 MG/100ML IV SOLN
500.0000 mg | INTRAVENOUS | Status: AC
  Administered 2018-01-22: 500 mg via INTRAVENOUS
  Filled 2018-01-21: qty 100

## 2018-01-21 MED ORDER — DEXMEDETOMIDINE HCL IN NACL 400 MCG/100ML IV SOLN
0.1000 ug/kg/h | INTRAVENOUS | Status: AC
  Administered 2018-01-22: 0.7 ug/kg/h via INTRAVENOUS
  Filled 2018-01-21: qty 100

## 2018-01-21 MED ORDER — PAPAVERINE HCL 30 MG/ML IJ SOLN
INTRAMUSCULAR | Status: AC
  Administered 2018-01-22: 500 mL
  Filled 2018-01-21: qty 2.5

## 2018-01-21 MED ORDER — TRANEXAMIC ACID 1000 MG/10ML IV SOLN
INTRAVENOUS | Status: DC
  Filled 2018-01-21 (×3): qty 450

## 2018-01-21 MED ORDER — TRANEXAMIC ACID (OHS) PUMP PRIME SOLUTION
2.0000 mg/kg | INTRAVENOUS | Status: DC
  Filled 2018-01-21 (×2): qty 3.04

## 2018-01-21 MED ORDER — TRANEXAMIC ACID 1000 MG/10ML IV SOLN
INTRAVENOUS | Status: DC
  Filled 2018-01-21: qty 450

## 2018-01-21 MED ORDER — VANCOMYCIN HCL 10 G IV SOLR
1500.0000 mg | INTRAVENOUS | Status: AC
  Administered 2018-01-22: 1500 mg via INTRAVENOUS
  Filled 2018-01-21: qty 1500

## 2018-01-21 MED ORDER — MAGNESIUM SULFATE 50 % IJ SOLN
40.0000 meq | INTRAMUSCULAR | Status: DC
  Filled 2018-01-21: qty 9.85

## 2018-01-21 MED ORDER — DOPAMINE-DEXTROSE 3.2-5 MG/ML-% IV SOLN
0.0000 ug/kg/min | INTRAVENOUS | Status: DC
  Filled 2018-01-21: qty 250

## 2018-01-21 NOTE — Anesthesia Preprocedure Evaluation (Addendum)
Anesthesia Evaluation  Patient identified by MRN, date of birth, ID band Patient awake    Reviewed: Allergy & Precautions, NPO status , Patient's Chart, lab work & pertinent test results  Airway Mallampati: III  TM Distance: >3 FB Neck ROM: Full    Dental  (+) Teeth Intact, Dental Advisory Given, Missing   Pulmonary shortness of breath, sleep apnea , former smoker,    Pulmonary exam normal breath sounds clear to auscultation       Cardiovascular hypertension, Pt. on medications + CAD and + Peripheral Vascular Disease  Normal cardiovascular exam Rhythm:Regular Rate:Normal     Neuro/Psych CVA, Residual Symptoms negative psych ROS   GI/Hepatic Neg liver ROS, GERD  ,  Endo/Other  diabetes, Type 2, Oral Hypoglycemic Agents  Renal/GU Renal InsufficiencyRenal disease     Musculoskeletal   Abdominal   Peds  Hematology  (+) Blood dyscrasia (Plavix), anemia ,   Anesthesia Other Findings   Reproductive/Obstetrics                           Lab Results  Component Value Date   WBC 12.0 (H) 01/20/2018   HGB 11.7 (L) 01/20/2018   HCT 36.4 (L) 01/20/2018   MCV 88.8 01/20/2018   PLT 300 01/20/2018   Lab Results  Component Value Date   CREATININE 1.28 (H) 01/20/2018   BUN 20 01/20/2018   NA 130 (L) 01/20/2018   K 4.3 01/20/2018   CL 97 (L) 01/20/2018   CO2 21 (L) 01/20/2018   Lab Results  Component Value Date   INR 0.99 01/20/2018   INR 0.98 12/30/2016   Echocardiogram 08/06/2017  Conclusions 1.  Poor echo window.  Wall motion abnormality is reduced sensitivity.  Left ventricular cavity is normal in size.  Moderate concentric hypertrophy of the left ventricle.  Mild decrease in LV systolic function.  Visual EF 45% with global hypokinesis.  Calculated EF 42%.  Doppler evidence of grade 2 (pseudo-normal) diastolic dysfunction.  Trace tricuspid regurgitation.  Unable to estimate PA pressure due to  absence/minimal TR signal. 2.  IVC is dilated with poor inspiration collapse consistent with elevated right atrial pressure.   Anesthesia Physical Anesthesia Plan  ASA: IV  Anesthesia Plan: General   Post-op Pain Management:    Induction: Intravenous  PONV Risk Score and Plan: 2 and Treatment may vary due to age or medical condition  Airway Management Planned: Oral ETT  Additional Equipment: Arterial line, CVP, PA Cath, TEE and Ultrasound Guidance Line Placement  Intra-op Plan:   Post-operative Plan: Post-operative intubation/ventilation  Informed Consent: I have reviewed the patients History and Physical, chart, labs and discussed the procedure including the risks, benefits and alternatives for the proposed anesthesia with the patient or authorized representative who has indicated his/her understanding and acceptance.   Dental advisory given  Plan Discussed with: CRNA and Anesthesiologist  Anesthesia Plan Comments:        Anesthesia Quick Evaluation

## 2018-01-22 ENCOUNTER — Inpatient Hospital Stay (HOSPITAL_COMMUNITY): Payer: BLUE CROSS/BLUE SHIELD | Admitting: Anesthesiology

## 2018-01-22 ENCOUNTER — Inpatient Hospital Stay (HOSPITAL_COMMUNITY)
Admission: RE | Admit: 2018-01-22 | Discharge: 2018-02-22 | DRG: 235 | Disposition: E | Payer: BLUE CROSS/BLUE SHIELD | Source: Ambulatory Visit | Attending: Thoracic Surgery (Cardiothoracic Vascular Surgery) | Admitting: Thoracic Surgery (Cardiothoracic Vascular Surgery)

## 2018-01-22 ENCOUNTER — Inpatient Hospital Stay (HOSPITAL_COMMUNITY): Payer: BLUE CROSS/BLUE SHIELD

## 2018-01-22 ENCOUNTER — Encounter (HOSPITAL_COMMUNITY): Payer: Self-pay

## 2018-01-22 ENCOUNTER — Inpatient Hospital Stay (HOSPITAL_COMMUNITY)
Admission: RE | Disposition: E | Payer: Self-pay | Source: Ambulatory Visit | Attending: Thoracic Surgery (Cardiothoracic Vascular Surgery)

## 2018-01-22 DIAGNOSIS — G9341 Metabolic encephalopathy: Secondary | ICD-10-CM | POA: Diagnosis not present

## 2018-01-22 DIAGNOSIS — I63332 Cerebral infarction due to thrombosis of left posterior cerebral artery: Secondary | ICD-10-CM | POA: Diagnosis not present

## 2018-01-22 DIAGNOSIS — D62 Acute posthemorrhagic anemia: Secondary | ICD-10-CM | POA: Diagnosis not present

## 2018-01-22 DIAGNOSIS — Z79899 Other long term (current) drug therapy: Secondary | ICD-10-CM

## 2018-01-22 DIAGNOSIS — R400 Somnolence: Secondary | ICD-10-CM

## 2018-01-22 DIAGNOSIS — Z9289 Personal history of other medical treatment: Secondary | ICD-10-CM

## 2018-01-22 DIAGNOSIS — B952 Enterococcus as the cause of diseases classified elsewhere: Secondary | ICD-10-CM | POA: Diagnosis not present

## 2018-01-22 DIAGNOSIS — Z0189 Encounter for other specified special examinations: Secondary | ICD-10-CM

## 2018-01-22 DIAGNOSIS — N182 Chronic kidney disease, stage 2 (mild): Secondary | ICD-10-CM | POA: Diagnosis present

## 2018-01-22 DIAGNOSIS — I5023 Acute on chronic systolic (congestive) heart failure: Secondary | ICD-10-CM | POA: Diagnosis not present

## 2018-01-22 DIAGNOSIS — R402214 Coma scale, best verbal response, none, 24 hours or more after hospital admission: Secondary | ICD-10-CM | POA: Diagnosis not present

## 2018-01-22 DIAGNOSIS — R509 Fever, unspecified: Secondary | ICD-10-CM | POA: Diagnosis not present

## 2018-01-22 DIAGNOSIS — Z515 Encounter for palliative care: Secondary | ICD-10-CM | POA: Diagnosis present

## 2018-01-22 DIAGNOSIS — R402314 Coma scale, best motor response, none, 24 hours or more after hospital admission: Secondary | ICD-10-CM | POA: Diagnosis not present

## 2018-01-22 DIAGNOSIS — I6523 Occlusion and stenosis of bilateral carotid arteries: Secondary | ICD-10-CM | POA: Diagnosis not present

## 2018-01-22 DIAGNOSIS — Z7984 Long term (current) use of oral hypoglycemic drugs: Secondary | ICD-10-CM

## 2018-01-22 DIAGNOSIS — J9601 Acute respiratory failure with hypoxia: Secondary | ICD-10-CM

## 2018-01-22 DIAGNOSIS — J9622 Acute and chronic respiratory failure with hypercapnia: Secondary | ICD-10-CM | POA: Diagnosis not present

## 2018-01-22 DIAGNOSIS — G9340 Encephalopathy, unspecified: Secondary | ICD-10-CM

## 2018-01-22 DIAGNOSIS — Z951 Presence of aortocoronary bypass graft: Secondary | ICD-10-CM

## 2018-01-22 DIAGNOSIS — D6489 Other specified anemias: Secondary | ICD-10-CM | POA: Diagnosis present

## 2018-01-22 DIAGNOSIS — G8250 Quadriplegia, unspecified: Secondary | ICD-10-CM | POA: Diagnosis not present

## 2018-01-22 DIAGNOSIS — Z66 Do not resuscitate: Secondary | ICD-10-CM | POA: Diagnosis present

## 2018-01-22 DIAGNOSIS — G8194 Hemiplegia, unspecified affecting left nondominant side: Secondary | ICD-10-CM | POA: Diagnosis not present

## 2018-01-22 DIAGNOSIS — R1310 Dysphagia, unspecified: Secondary | ICD-10-CM | POA: Diagnosis not present

## 2018-01-22 DIAGNOSIS — E870 Hyperosmolality and hypernatremia: Secondary | ICD-10-CM | POA: Diagnosis present

## 2018-01-22 DIAGNOSIS — E876 Hypokalemia: Secondary | ICD-10-CM | POA: Diagnosis not present

## 2018-01-22 DIAGNOSIS — E785 Hyperlipidemia, unspecified: Secondary | ICD-10-CM | POA: Diagnosis present

## 2018-01-22 DIAGNOSIS — I63233 Cerebral infarction due to unspecified occlusion or stenosis of bilateral carotid arteries: Secondary | ICD-10-CM | POA: Diagnosis not present

## 2018-01-22 DIAGNOSIS — I2510 Atherosclerotic heart disease of native coronary artery without angina pectoris: Principal | ICD-10-CM | POA: Diagnosis present

## 2018-01-22 DIAGNOSIS — I69354 Hemiplegia and hemiparesis following cerebral infarction affecting left non-dominant side: Secondary | ICD-10-CM | POA: Diagnosis not present

## 2018-01-22 DIAGNOSIS — J9811 Atelectasis: Secondary | ICD-10-CM | POA: Diagnosis not present

## 2018-01-22 DIAGNOSIS — I6330 Cerebral infarction due to thrombosis of unspecified cerebral artery: Secondary | ICD-10-CM | POA: Diagnosis not present

## 2018-01-22 DIAGNOSIS — I255 Ischemic cardiomyopathy: Secondary | ICD-10-CM | POA: Diagnosis present

## 2018-01-22 DIAGNOSIS — R414 Neurologic neglect syndrome: Secondary | ICD-10-CM | POA: Diagnosis not present

## 2018-01-22 DIAGNOSIS — R4182 Altered mental status, unspecified: Secondary | ICD-10-CM | POA: Diagnosis not present

## 2018-01-22 DIAGNOSIS — J9690 Respiratory failure, unspecified, unspecified whether with hypoxia or hypercapnia: Secondary | ICD-10-CM

## 2018-01-22 DIAGNOSIS — R6521 Severe sepsis with septic shock: Secondary | ICD-10-CM | POA: Diagnosis not present

## 2018-01-22 DIAGNOSIS — E1165 Type 2 diabetes mellitus with hyperglycemia: Secondary | ICD-10-CM | POA: Diagnosis not present

## 2018-01-22 DIAGNOSIS — I10 Essential (primary) hypertension: Secondary | ICD-10-CM | POA: Diagnosis not present

## 2018-01-22 DIAGNOSIS — J181 Lobar pneumonia, unspecified organism: Secondary | ICD-10-CM | POA: Diagnosis not present

## 2018-01-22 DIAGNOSIS — Z880 Allergy status to penicillin: Secondary | ICD-10-CM

## 2018-01-22 DIAGNOSIS — I63411 Cerebral infarction due to embolism of right middle cerebral artery: Secondary | ICD-10-CM | POA: Diagnosis not present

## 2018-01-22 DIAGNOSIS — Y95 Nosocomial condition: Secondary | ICD-10-CM | POA: Diagnosis not present

## 2018-01-22 DIAGNOSIS — Z6841 Body Mass Index (BMI) 40.0 and over, adult: Secondary | ICD-10-CM | POA: Diagnosis not present

## 2018-01-22 DIAGNOSIS — A419 Sepsis, unspecified organism: Secondary | ICD-10-CM | POA: Diagnosis not present

## 2018-01-22 DIAGNOSIS — Z01818 Encounter for other preprocedural examination: Secondary | ICD-10-CM

## 2018-01-22 DIAGNOSIS — I672 Cerebral atherosclerosis: Secondary | ICD-10-CM | POA: Diagnosis present

## 2018-01-22 DIAGNOSIS — N179 Acute kidney failure, unspecified: Secondary | ICD-10-CM | POA: Diagnosis not present

## 2018-01-22 DIAGNOSIS — J9602 Acute respiratory failure with hypercapnia: Secondary | ICD-10-CM | POA: Diagnosis not present

## 2018-01-22 DIAGNOSIS — R29738 NIHSS score 38: Secondary | ICD-10-CM | POA: Diagnosis not present

## 2018-01-22 DIAGNOSIS — J9621 Acute and chronic respiratory failure with hypoxia: Secondary | ICD-10-CM | POA: Diagnosis not present

## 2018-01-22 DIAGNOSIS — I639 Cerebral infarction, unspecified: Secondary | ICD-10-CM | POA: Diagnosis not present

## 2018-01-22 DIAGNOSIS — L8990 Pressure ulcer of unspecified site, unspecified stage: Secondary | ICD-10-CM

## 2018-01-22 DIAGNOSIS — R402114 Coma scale, eyes open, never, 24 hours or more after hospital admission: Secondary | ICD-10-CM | POA: Diagnosis not present

## 2018-01-22 DIAGNOSIS — Z978 Presence of other specified devices: Secondary | ICD-10-CM

## 2018-01-22 DIAGNOSIS — Z09 Encounter for follow-up examination after completed treatment for conditions other than malignant neoplasm: Secondary | ICD-10-CM

## 2018-01-22 DIAGNOSIS — Z8249 Family history of ischemic heart disease and other diseases of the circulatory system: Secondary | ICD-10-CM

## 2018-01-22 DIAGNOSIS — E871 Hypo-osmolality and hyponatremia: Secondary | ICD-10-CM | POA: Diagnosis not present

## 2018-01-22 DIAGNOSIS — Z8673 Personal history of transient ischemic attack (TIA), and cerebral infarction without residual deficits: Secondary | ICD-10-CM | POA: Diagnosis not present

## 2018-01-22 DIAGNOSIS — Z91048 Other nonmedicinal substance allergy status: Secondary | ICD-10-CM

## 2018-01-22 DIAGNOSIS — I63511 Cerebral infarction due to unspecified occlusion or stenosis of right middle cerebral artery: Secondary | ICD-10-CM | POA: Diagnosis not present

## 2018-01-22 DIAGNOSIS — Z9689 Presence of other specified functional implants: Secondary | ICD-10-CM

## 2018-01-22 DIAGNOSIS — E1151 Type 2 diabetes mellitus with diabetic peripheral angiopathy without gangrene: Secondary | ICD-10-CM | POA: Diagnosis present

## 2018-01-22 DIAGNOSIS — G4733 Obstructive sleep apnea (adult) (pediatric): Secondary | ICD-10-CM | POA: Diagnosis present

## 2018-01-22 DIAGNOSIS — Z87891 Personal history of nicotine dependence: Secondary | ICD-10-CM

## 2018-01-22 DIAGNOSIS — Z91030 Bee allergy status: Secondary | ICD-10-CM

## 2018-01-22 DIAGNOSIS — I130 Hypertensive heart and chronic kidney disease with heart failure and stage 1 through stage 4 chronic kidney disease, or unspecified chronic kidney disease: Secondary | ICD-10-CM | POA: Diagnosis present

## 2018-01-22 DIAGNOSIS — T85598A Other mechanical complication of other gastrointestinal prosthetic devices, implants and grafts, initial encounter: Secondary | ICD-10-CM

## 2018-01-22 DIAGNOSIS — R579 Shock, unspecified: Secondary | ICD-10-CM | POA: Diagnosis not present

## 2018-01-22 DIAGNOSIS — I9589 Other hypotension: Secondary | ICD-10-CM | POA: Diagnosis not present

## 2018-01-22 DIAGNOSIS — Z7189 Other specified counseling: Secondary | ICD-10-CM | POA: Diagnosis not present

## 2018-01-22 DIAGNOSIS — R339 Retention of urine, unspecified: Secondary | ICD-10-CM | POA: Diagnosis not present

## 2018-01-22 DIAGNOSIS — K219 Gastro-esophageal reflux disease without esophagitis: Secondary | ICD-10-CM | POA: Diagnosis present

## 2018-01-22 DIAGNOSIS — Z7902 Long term (current) use of antithrombotics/antiplatelets: Secondary | ICD-10-CM

## 2018-01-22 DIAGNOSIS — I252 Old myocardial infarction: Secondary | ICD-10-CM

## 2018-01-22 DIAGNOSIS — Z7289 Other problems related to lifestyle: Secondary | ICD-10-CM

## 2018-01-22 DIAGNOSIS — E1122 Type 2 diabetes mellitus with diabetic chronic kidney disease: Secondary | ICD-10-CM | POA: Diagnosis present

## 2018-01-22 DIAGNOSIS — Z7982 Long term (current) use of aspirin: Secondary | ICD-10-CM

## 2018-01-22 HISTORY — PX: RADIAL ARTERY HARVEST: SHX5067

## 2018-01-22 HISTORY — PX: CORONARY ARTERY BYPASS GRAFT: SHX141

## 2018-01-22 HISTORY — PX: TEE WITHOUT CARDIOVERSION: SHX5443

## 2018-01-22 LAB — CBC
HCT: 25.9 % — ABNORMAL LOW (ref 39.0–52.0)
HCT: 24.2 % — ABNORMAL LOW (ref 39.0–52.0)
Hemoglobin: 8.3 g/dL — ABNORMAL LOW (ref 13.0–17.0)
Hemoglobin: 8 g/dL — ABNORMAL LOW (ref 13.0–17.0)
MCH: 28.7 pg (ref 26.0–34.0)
MCH: 29.9 pg (ref 26.0–34.0)
MCHC: 32 g/dL (ref 30.0–36.0)
MCHC: 33.1 g/dL (ref 30.0–36.0)
MCV: 89.6 fL (ref 78.0–100.0)
MCV: 90.3 fL (ref 78.0–100.0)
PLATELETS: 234 10*3/uL (ref 150–400)
PLATELETS: 222 10*3/uL (ref 150–400)
RBC: 2.89 MIL/uL — AB (ref 4.22–5.81)
RBC: 2.68 MIL/uL — AB (ref 4.22–5.81)
RDW: 14.9 % (ref 11.5–15.5)
RDW: 15.3 % (ref 11.5–15.5)
WBC: 20.7 10*3/uL — AB (ref 4.0–10.5)
WBC: 15.1 10*3/uL — ABNORMAL HIGH (ref 4.0–10.5)

## 2018-01-22 LAB — POCT I-STAT 3, VENOUS BLOOD GAS (G3P V)
Acid-base deficit: 4 mmol/L — ABNORMAL HIGH (ref 0.0–2.0)
Bicarbonate: 21.2 mmol/L (ref 20.0–28.0)
O2 Saturation: 100 %
PCO2 VEN: 36.3 mmHg — AB (ref 44.0–60.0)
PH VEN: 7.374 (ref 7.250–7.430)
PO2 VEN: 170 mmHg — AB (ref 32.0–45.0)
Patient temperature: 37
TCO2: 22 mmol/L (ref 22–32)

## 2018-01-22 LAB — POCT I-STAT, CHEM 8
BUN: 23 mg/dL — ABNORMAL HIGH (ref 6–20)
BUN: 21 mg/dL — ABNORMAL HIGH (ref 6–20)
BUN: 21 mg/dL — ABNORMAL HIGH (ref 6–20)
BUN: 18 mg/dL (ref 6–20)
BUN: 21 mg/dL — ABNORMAL HIGH (ref 6–20)
BUN: 21 mg/dL — AB (ref 6–20)
BUN: 21 mg/dL — ABNORMAL HIGH (ref 6–20)
CALCIUM ION: 1.18 mmol/L (ref 1.15–1.40)
CALCIUM ION: 1.19 mmol/L (ref 1.15–1.40)
CALCIUM ION: 1.18 mmol/L (ref 1.15–1.40)
CALCIUM ION: 0.98 mmol/L — AB (ref 1.15–1.40)
CALCIUM ION: 1.1 mmol/L — AB (ref 1.15–1.40)
CHLORIDE: 99 mmol/L — AB (ref 101–111)
CHLORIDE: 101 mmol/L (ref 101–111)
CHLORIDE: 103 mmol/L (ref 101–111)
CREATININE: 1.1 mg/dL (ref 0.61–1.24)
CREATININE: 1.1 mg/dL (ref 0.61–1.24)
Calcium, Ion: 1.1 mmol/L — ABNORMAL LOW (ref 1.15–1.40)
Calcium, Ion: 1.13 mmol/L — ABNORMAL LOW (ref 1.15–1.40)
Chloride: 101 mmol/L (ref 101–111)
Chloride: 100 mmol/L — ABNORMAL LOW (ref 101–111)
Chloride: 97 mmol/L — ABNORMAL LOW (ref 101–111)
Chloride: 100 mmol/L — ABNORMAL LOW (ref 101–111)
Creatinine, Ser: 1.1 mg/dL (ref 0.61–1.24)
Creatinine, Ser: 0.9 mg/dL (ref 0.61–1.24)
Creatinine, Ser: 1.2 mg/dL (ref 0.61–1.24)
Creatinine, Ser: 1.2 mg/dL (ref 0.61–1.24)
Creatinine, Ser: 1.2 mg/dL (ref 0.61–1.24)
GLUCOSE: 149 mg/dL — AB (ref 65–99)
GLUCOSE: 123 mg/dL — AB (ref 65–99)
GLUCOSE: 126 mg/dL — AB (ref 65–99)
GLUCOSE: 125 mg/dL — AB (ref 65–99)
Glucose, Bld: 119 mg/dL — ABNORMAL HIGH (ref 65–99)
Glucose, Bld: 216 mg/dL — ABNORMAL HIGH (ref 65–99)
Glucose, Bld: 207 mg/dL — ABNORMAL HIGH (ref 65–99)
HCT: 32 % — ABNORMAL LOW (ref 39.0–52.0)
HCT: 29 % — ABNORMAL LOW (ref 39.0–52.0)
HCT: 27 % — ABNORMAL LOW (ref 39.0–52.0)
HCT: 27 % — ABNORMAL LOW (ref 39.0–52.0)
HEMATOCRIT: 25 % — AB (ref 39.0–52.0)
HEMATOCRIT: 25 % — AB (ref 39.0–52.0)
HEMATOCRIT: 25 % — AB (ref 39.0–52.0)
HEMOGLOBIN: 9.9 g/dL — AB (ref 13.0–17.0)
HEMOGLOBIN: 9.2 g/dL — AB (ref 13.0–17.0)
HEMOGLOBIN: 8.5 g/dL — AB (ref 13.0–17.0)
HEMOGLOBIN: 8.5 g/dL — AB (ref 13.0–17.0)
Hemoglobin: 10.9 g/dL — ABNORMAL LOW (ref 13.0–17.0)
Hemoglobin: 8.5 g/dL — ABNORMAL LOW (ref 13.0–17.0)
Hemoglobin: 9.2 g/dL — ABNORMAL LOW (ref 13.0–17.0)
POTASSIUM: 4.7 mmol/L (ref 3.5–5.1)
POTASSIUM: 4.2 mmol/L (ref 3.5–5.1)
POTASSIUM: 4.5 mmol/L (ref 3.5–5.1)
Potassium: 4.1 mmol/L (ref 3.5–5.1)
Potassium: 4.1 mmol/L (ref 3.5–5.1)
Potassium: 4 mmol/L (ref 3.5–5.1)
Potassium: 4.6 mmol/L (ref 3.5–5.1)
SODIUM: 137 mmol/L (ref 135–145)
SODIUM: 138 mmol/L (ref 135–145)
SODIUM: 136 mmol/L (ref 135–145)
SODIUM: 137 mmol/L (ref 135–145)
Sodium: 137 mmol/L (ref 135–145)
Sodium: 137 mmol/L (ref 135–145)
Sodium: 138 mmol/L (ref 135–145)
TCO2: 29 mmol/L (ref 22–32)
TCO2: 26 mmol/L (ref 22–32)
TCO2: 25 mmol/L (ref 22–32)
TCO2: 29 mmol/L (ref 22–32)
TCO2: 28 mmol/L (ref 22–32)
TCO2: 25 mmol/L (ref 22–32)
TCO2: 23 mmol/L (ref 22–32)

## 2018-01-22 LAB — GLUCOSE, CAPILLARY
GLUCOSE-CAPILLARY: 107 mg/dL — AB (ref 65–99)
Glucose-Capillary: 167 mg/dL — ABNORMAL HIGH (ref 65–99)
Glucose-Capillary: 105 mg/dL — ABNORMAL HIGH (ref 65–99)
Glucose-Capillary: 98 mg/dL (ref 65–99)

## 2018-01-22 LAB — PROTIME-INR
INR: 1.33
PROTHROMBIN TIME: 16.4 s — AB (ref 11.4–15.2)

## 2018-01-22 LAB — PLATELET COUNT: Platelets: 266 10*3/uL (ref 150–400)

## 2018-01-22 LAB — POCT I-STAT 3, ART BLOOD GAS (G3+)
ACID-BASE EXCESS: 1 mmol/L (ref 0.0–2.0)
Acid-Base Excess: 2 mmol/L (ref 0.0–2.0)
Acid-base deficit: 1 mmol/L (ref 0.0–2.0)
Acid-base deficit: 1 mmol/L (ref 0.0–2.0)
Acid-base deficit: 4 mmol/L — ABNORMAL HIGH (ref 0.0–2.0)
BICARBONATE: 28.2 mmol/L — AB (ref 20.0–28.0)
BICARBONATE: 23.9 mmol/L (ref 20.0–28.0)
BICARBONATE: 24.1 mmol/L (ref 20.0–28.0)
Bicarbonate: 25.8 mmol/L (ref 20.0–28.0)
Bicarbonate: 22.1 mmol/L (ref 20.0–28.0)
O2 Saturation: 100 %
O2 Saturation: 100 %
O2 Saturation: 100 %
O2 Saturation: 100 %
O2 Saturation: 98 %
PCO2 ART: 37.2 mmHg (ref 32.0–48.0)
PCO2 ART: 41.6 mmHg (ref 32.0–48.0)
PCO2 ART: 41.9 mmHg (ref 32.0–48.0)
PCO2 ART: 41.6 mmHg (ref 32.0–48.0)
PH ART: 7.333 — AB (ref 7.350–7.450)
PH ART: 7.449 (ref 7.350–7.450)
PH ART: 7.365 (ref 7.350–7.450)
PO2 ART: 412 mmHg — AB (ref 83.0–108.0)
Patient temperature: 36.2
Patient temperature: 36.7
TCO2: 30 mmol/L (ref 22–32)
TCO2: 27 mmol/L (ref 22–32)
TCO2: 25 mmol/L (ref 22–32)
TCO2: 25 mmol/L (ref 22–32)
TCO2: 23 mmol/L (ref 22–32)
pCO2 arterial: 53.2 mmHg — ABNORMAL HIGH (ref 32.0–48.0)
pH, Arterial: 7.368 (ref 7.350–7.450)
pH, Arterial: 7.332 — ABNORMAL LOW (ref 7.350–7.450)
pO2, Arterial: 283 mmHg — ABNORMAL HIGH (ref 83.0–108.0)
pO2, Arterial: 192 mmHg — ABNORMAL HIGH (ref 83.0–108.0)
pO2, Arterial: 195 mmHg — ABNORMAL HIGH (ref 83.0–108.0)
pO2, Arterial: 117 mmHg — ABNORMAL HIGH (ref 83.0–108.0)

## 2018-01-22 LAB — HEMOGLOBIN AND HEMATOCRIT, BLOOD
HCT: 24.2 % — ABNORMAL LOW (ref 39.0–52.0)
HEMOGLOBIN: 8 g/dL — AB (ref 13.0–17.0)

## 2018-01-22 LAB — APTT: APTT: 34 s (ref 24–36)

## 2018-01-22 LAB — CREATININE, SERUM
Creatinine, Ser: 1.27 mg/dL — ABNORMAL HIGH (ref 0.61–1.24)
GFR calc Af Amer: >60 mL/min (ref >60)
GFR calc non Af Amer: >60 mL/min (ref >60)

## 2018-01-22 LAB — PREPARE RBC (CROSSMATCH)

## 2018-01-22 LAB — MAGNESIUM: MAGNESIUM: 2.6 mg/dL — AB (ref 1.7–2.4)

## 2018-01-22 SURGERY — CORONARY ARTERY BYPASS GRAFTING (CABG)
Anesthesia: General | Site: Chest | Laterality: Right

## 2018-01-22 MED ORDER — ARTIFICIAL TEARS OPHTHALMIC OINT
TOPICAL_OINTMENT | OPHTHALMIC | Status: DC | PRN
  Administered 2018-01-22: 1 via OPHTHALMIC

## 2018-01-22 MED ORDER — MIDAZOLAM HCL 2 MG/2ML IJ SOLN
INTRAMUSCULAR | Status: AC
  Filled 2018-01-22: qty 2

## 2018-01-22 MED ORDER — SODIUM CHLORIDE 0.9 % IJ SOLN
OROMUCOSAL | Status: DC | PRN
  Administered 2018-01-22 (×3): via TOPICAL

## 2018-01-22 MED ORDER — ROCURONIUM BROMIDE 10 MG/ML (PF) SYRINGE
PREFILLED_SYRINGE | INTRAVENOUS | Status: AC
  Filled 2018-01-22: qty 5

## 2018-01-22 MED ORDER — PHENYLEPHRINE 40 MCG/ML (10ML) SYRINGE FOR IV PUSH (FOR BLOOD PRESSURE SUPPORT)
PREFILLED_SYRINGE | INTRAVENOUS | Status: AC
  Filled 2018-01-22: qty 10

## 2018-01-22 MED ORDER — SODIUM CHLORIDE 0.9 % IV SOLN
0.0000 ug/min | INTRAVENOUS | Status: DC
  Filled 2018-01-22: qty 2

## 2018-01-22 MED ORDER — SUCCINYLCHOLINE CHLORIDE 200 MG/10ML IV SOSY
PREFILLED_SYRINGE | INTRAVENOUS | Status: AC
  Filled 2018-01-22: qty 10

## 2018-01-22 MED ORDER — NITROGLYCERIN IN D5W 200-5 MCG/ML-% IV SOLN: 0.0000 ug/min | INTRAVENOUS | Status: AC

## 2018-01-22 MED ORDER — PROTAMINE SULFATE 10 MG/ML IV SOLN
INTRAVENOUS | Status: AC
  Filled 2018-01-22: qty 5

## 2018-01-22 MED ORDER — ASPIRIN 81 MG PO CHEW: 324.0000 mg | CHEWABLE_TABLET | Freq: Every day | ORAL | Status: DC

## 2018-01-22 MED ORDER — SODIUM CHLORIDE 0.9% FLUSH: 3.0000 mL | INTRAVENOUS | Status: DC | PRN

## 2018-01-22 MED ORDER — HEPARIN SODIUM (PORCINE) 1000 UNIT/ML IJ SOLN
INTRAMUSCULAR | Status: AC
  Filled 2018-01-22: qty 1

## 2018-01-22 MED ORDER — SODIUM CHLORIDE 0.9 % IV SOLN
INTRAVENOUS | Status: DC | PRN
  Administered 2018-01-22: 14:00:00 via INTRAVENOUS

## 2018-01-22 MED ORDER — SODIUM CHLORIDE 0.9 % IV SOLN
0.0000 ug/kg/h | INTRAVENOUS | Status: DC
  Administered 2018-01-22: 0.7 ug/kg/h via INTRAVENOUS
  Filled 2018-01-22: qty 2

## 2018-01-22 MED ORDER — PHENYLEPHRINE HCL 10 MG/ML IJ SOLN
INTRAVENOUS | Status: DC | PRN
  Administered 2018-01-22: 20 ug/min via INTRAVENOUS

## 2018-01-22 MED ORDER — ARTIFICIAL TEARS OPHTHALMIC OINT
TOPICAL_OINTMENT | OPHTHALMIC | Status: AC
  Filled 2018-01-22: qty 3.5

## 2018-01-22 MED ORDER — ORAL CARE MOUTH RINSE
15.0000 mL | Freq: Four times a day (QID) | OROMUCOSAL | Status: DC
  Administered 2018-01-23 (×2): 15 mL via OROMUCOSAL

## 2018-01-22 MED ORDER — PHENYLEPHRINE 40 MCG/ML (10ML) SYRINGE FOR IV PUSH (FOR BLOOD PRESSURE SUPPORT)
PREFILLED_SYRINGE | INTRAVENOUS | Status: DC | PRN
  Administered 2018-01-22: 40 ug via INTRAVENOUS

## 2018-01-22 MED ORDER — SUCCINYLCHOLINE CHLORIDE 200 MG/10ML IV SOSY
PREFILLED_SYRINGE | INTRAVENOUS | Status: DC | PRN
  Administered 2018-01-22: 140 mg via INTRAVENOUS

## 2018-01-22 MED ORDER — SODIUM CHLORIDE 0.9% FLUSH
10.0000 mL | Freq: Two times a day (BID) | INTRAVENOUS | Status: DC
  Administered 2018-01-22 – 2018-01-23 (×2): 10 mL

## 2018-01-22 MED ORDER — PLASMA-LYTE 148 IV SOLN
INTRAVENOUS | Status: AC
  Administered 2018-01-22: 500 mL
  Filled 2018-01-22: qty 2.5

## 2018-01-22 MED ORDER — MORPHINE SULFATE (PF) 4 MG/ML IV SOLN
1.0000 mg | INTRAVENOUS | Status: AC | PRN
  Administered 2018-01-22 – 2018-01-23 (×4): 2 mg via INTRAVENOUS
  Filled 2018-01-22 (×2): qty 1

## 2018-01-22 MED ORDER — LACTATED RINGERS IV SOLN: 500.0000 mL | Freq: Once | INTRAVENOUS | Status: DC | PRN

## 2018-01-22 MED ORDER — ASPIRIN EC 325 MG PO TBEC
325.0000 mg | DELAYED_RELEASE_TABLET | Freq: Every day | ORAL | Status: DC
  Administered 2018-01-23: 325 mg via ORAL
  Filled 2018-01-22: qty 1

## 2018-01-22 MED ORDER — VANCOMYCIN HCL IN DEXTROSE 1-5 GM/200ML-% IV SOLN
1000.0000 mg | Freq: Once | INTRAVENOUS | Status: AC
  Administered 2018-01-22: 1000 mg via INTRAVENOUS
  Filled 2018-01-22: qty 200

## 2018-01-22 MED ORDER — PROTAMINE SULFATE 10 MG/ML IV SOLN
INTRAVENOUS | Status: DC | PRN
  Administered 2018-01-22 (×2): 100 mg via INTRAVENOUS
  Administered 2018-01-22: 20 mg via INTRAVENOUS
  Administered 2018-01-22: 50 mg via INTRAVENOUS
  Administered 2018-01-22: 30 mg via INTRAVENOUS

## 2018-01-22 MED ORDER — SODIUM CHLORIDE 0.9% FLUSH: 10.0000 mL | INTRAVENOUS | Status: DC | PRN

## 2018-01-22 MED ORDER — LIDOCAINE 2% (20 MG/ML) 5 ML SYRINGE
INTRAMUSCULAR | Status: AC
  Filled 2018-01-22: qty 5

## 2018-01-22 MED ORDER — MAGNESIUM SULFATE 4 GM/100ML IV SOLN
4.0000 g | Freq: Once | INTRAVENOUS | Status: AC
  Administered 2018-01-22: 4 g via INTRAVENOUS
  Filled 2018-01-22: qty 100

## 2018-01-22 MED ORDER — LACTATED RINGERS IV SOLN
INTRAVENOUS | Status: DC | PRN
  Administered 2018-01-22: 07:00:00 via INTRAVENOUS

## 2018-01-22 MED ORDER — LACTATED RINGERS IV SOLN
INTRAVENOUS | Status: DC | PRN
  Administered 2018-01-22 (×2): via INTRAVENOUS

## 2018-01-22 MED ORDER — CHLORHEXIDINE GLUCONATE CLOTH 2 % EX PADS
6.0000 | MEDICATED_PAD | Freq: Every day | CUTANEOUS | Status: DC
  Administered 2018-01-23: 6 via TOPICAL

## 2018-01-22 MED ORDER — DEXMEDETOMIDINE HCL IN NACL 200 MCG/50ML IV SOLN
0.4000 ug/kg/h | INTRAVENOUS | Status: DC
  Administered 2018-01-22: 0.5 ug/kg/h via INTRAVENOUS
  Administered 2018-01-23: 0.2 ug/kg/h via INTRAVENOUS
  Filled 2018-01-22 (×3): qty 50

## 2018-01-22 MED ORDER — SODIUM CHLORIDE 0.9 % IV SOLN: INTRAVENOUS | Status: DC

## 2018-01-22 MED ORDER — LEVOFLOXACIN IN D5W 750 MG/150ML IV SOLN
750.0000 mg | INTRAVENOUS | Status: AC
  Administered 2018-01-23: 750 mg via INTRAVENOUS
  Filled 2018-01-22: qty 150

## 2018-01-22 MED ORDER — CHLORHEXIDINE GLUCONATE 4 % EX LIQD: 30.0000 mL | CUTANEOUS | Status: DC

## 2018-01-22 MED ORDER — CHLORHEXIDINE GLUCONATE 0.12% ORAL RINSE (MEDLINE KIT)
15.0000 mL | Freq: Two times a day (BID) | OROMUCOSAL | Status: DC
  Administered 2018-01-22: 15 mL via OROMUCOSAL

## 2018-01-22 MED ORDER — LACTATED RINGERS IV SOLN: INTRAVENOUS | Status: DC

## 2018-01-22 MED ORDER — ACETAMINOPHEN 500 MG PO TABS
1000.0000 mg | ORAL_TABLET | Freq: Four times a day (QID) | ORAL | Status: AC
  Administered 2018-01-23 (×2): 1000 mg via ORAL
  Filled 2018-01-22 (×2): qty 2

## 2018-01-22 MED ORDER — TRAMADOL HCL 50 MG PO TABS
50.0000 mg | ORAL_TABLET | ORAL | Status: DC | PRN
  Administered 2018-01-29: 50 mg via ORAL
  Filled 2018-01-22: qty 1

## 2018-01-22 MED ORDER — PROPOFOL 10 MG/ML IV BOLUS
INTRAVENOUS | Status: AC
  Filled 2018-01-22: qty 20

## 2018-01-22 MED ORDER — HEPARIN SODIUM (PORCINE) 1000 UNIT/ML IJ SOLN
INTRAMUSCULAR | Status: DC | PRN
  Administered 2018-01-22: 53000 [IU] via INTRAVENOUS
  Administered 2018-01-22: 2000 [IU] via INTRAVENOUS

## 2018-01-22 MED ORDER — LIDOCAINE 2% (20 MG/ML) 5 ML SYRINGE
INTRAMUSCULAR | Status: DC | PRN
  Administered 2018-01-22: 100 mg via INTRAVENOUS

## 2018-01-22 MED ORDER — MIDAZOLAM HCL 10 MG/2ML IJ SOLN
INTRAMUSCULAR | Status: AC
  Filled 2018-01-22: qty 2

## 2018-01-22 MED ORDER — MIDAZOLAM HCL 2 MG/2ML IJ SOLN
2.0000 mg | INTRAMUSCULAR | Status: DC | PRN
  Administered 2018-01-22: 2 mg via INTRAVENOUS
  Filled 2018-01-22: qty 2

## 2018-01-22 MED ORDER — PROPOFOL 10 MG/ML IV BOLUS
INTRAVENOUS | Status: DC | PRN
  Administered 2018-01-22: 100 mg via INTRAVENOUS

## 2018-01-22 MED ORDER — POTASSIUM CHLORIDE 10 MEQ/50ML IV SOLN: 10.0000 meq | INTRAVENOUS | Status: AC

## 2018-01-22 MED ORDER — TRANEXAMIC ACID 1000 MG/10ML IV SOLN
INTRAVENOUS | Status: DC | PRN
  Administered 2018-01-22: 1.5 mg/kg/h via INTRAVENOUS

## 2018-01-22 MED ORDER — SODIUM CHLORIDE 0.9% FLUSH
3.0000 mL | Freq: Two times a day (BID) | INTRAVENOUS | Status: DC
  Administered 2018-01-23 (×2): 3 mL via INTRAVENOUS

## 2018-01-22 MED ORDER — 0.9 % SODIUM CHLORIDE (POUR BTL) OPTIME
TOPICAL | Status: DC | PRN
  Administered 2018-01-22: 6000 mL
  Administered 2018-01-22: 1000 mL

## 2018-01-22 MED ORDER — SODIUM CHLORIDE 0.9 % IV SOLN: 250.0000 mL | INTRAVENOUS | Status: DC

## 2018-01-22 MED ORDER — ONDANSETRON HCL 4 MG/2ML IJ SOLN
4.0000 mg | Freq: Four times a day (QID) | INTRAMUSCULAR | Status: DC | PRN
  Administered 2018-01-22 – 2018-01-31 (×2): 4 mg via INTRAVENOUS
  Filled 2018-01-22 (×2): qty 2

## 2018-01-22 MED ORDER — ACETAMINOPHEN 160 MG/5ML PO SOLN
1000.0000 mg | Freq: Four times a day (QID) | ORAL | Status: AC
  Administered 2018-01-25 – 2018-01-27 (×5): 1000 mg
  Filled 2018-01-22 (×6): qty 40.6

## 2018-01-22 MED ORDER — BISACODYL 10 MG RE SUPP
10.0000 mg | Freq: Every day | RECTAL | Status: DC
  Administered 2018-01-25 – 2018-02-07 (×4): 10 mg via RECTAL
  Filled 2018-01-22 (×4): qty 1

## 2018-01-22 MED ORDER — FENTANYL CITRATE (PF) 250 MCG/5ML IJ SOLN
INTRAMUSCULAR | Status: DC | PRN
  Administered 2018-01-22: 250 ug via INTRAVENOUS
  Administered 2018-01-22: 50 ug via INTRAVENOUS
  Administered 2018-01-22: 100 ug via INTRAVENOUS
  Administered 2018-01-22: 50 ug via INTRAVENOUS
  Administered 2018-01-22: 150 ug via INTRAVENOUS
  Administered 2018-01-22: 100 ug via INTRAVENOUS
  Administered 2018-01-22: 500 ug via INTRAVENOUS
  Administered 2018-01-22: 50 ug via INTRAVENOUS

## 2018-01-22 MED ORDER — INSULIN REGULAR BOLUS VIA INFUSION
0.0000 [IU] | Freq: Three times a day (TID) | INTRAVENOUS | Status: DC
  Filled 2018-01-22: qty 10

## 2018-01-22 MED ORDER — ACETAMINOPHEN 650 MG RE SUPP
650.0000 mg | Freq: Once | RECTAL | Status: AC
  Administered 2018-01-22: 650 mg via RECTAL

## 2018-01-22 MED ORDER — FENTANYL CITRATE (PF) 250 MCG/5ML IJ SOLN
INTRAMUSCULAR | Status: AC
  Filled 2018-01-22: qty 25

## 2018-01-22 MED ORDER — ALBUMIN HUMAN 5 % IV SOLN
250.0000 mL | INTRAVENOUS | Status: AC | PRN
  Administered 2018-01-22 – 2018-01-23 (×3): 250 mL via INTRAVENOUS
  Filled 2018-01-22: qty 250

## 2018-01-22 MED ORDER — MIDAZOLAM HCL 5 MG/5ML IJ SOLN
INTRAMUSCULAR | Status: DC | PRN
  Administered 2018-01-22: 1 mg via INTRAVENOUS
  Administered 2018-01-22: 2 mg via INTRAVENOUS
  Administered 2018-01-22: 5 mg via INTRAVENOUS
  Administered 2018-01-22: 2 mg via INTRAVENOUS
  Administered 2018-01-22: 3 mg via INTRAVENOUS
  Administered 2018-01-22: 1 mg via INTRAVENOUS
  Administered 2018-01-22 (×2): 2 mg via INTRAVENOUS
  Administered 2018-01-22: 1 mg via INTRAVENOUS

## 2018-01-22 MED ORDER — ALBUMIN HUMAN 5 % IV SOLN
INTRAVENOUS | Status: DC | PRN
  Administered 2018-01-22: 14:00:00 via INTRAVENOUS

## 2018-01-22 MED ORDER — PANTOPRAZOLE SODIUM 40 MG PO TBEC
40.0000 mg | DELAYED_RELEASE_TABLET | Freq: Every day | ORAL | Status: DC
  Administered 2018-01-26 – 2018-01-28 (×3): 40 mg via ORAL
  Filled 2018-01-22 (×3): qty 1

## 2018-01-22 MED ORDER — BISACODYL 5 MG PO TBEC
10.0000 mg | DELAYED_RELEASE_TABLET | Freq: Every day | ORAL | Status: DC
  Administered 2018-01-23 – 2018-02-12 (×14): 10 mg via ORAL
  Filled 2018-01-22 (×15): qty 2

## 2018-01-22 MED ORDER — ACETAMINOPHEN 160 MG/5ML PO SOLN: 650.0000 mg | Freq: Once | ORAL | Status: AC

## 2018-01-22 MED ORDER — ROCURONIUM BROMIDE 10 MG/ML (PF) SYRINGE
PREFILLED_SYRINGE | INTRAVENOUS | Status: DC | PRN
  Administered 2018-01-22 (×7): 50 mg via INTRAVENOUS

## 2018-01-22 MED ORDER — FAMOTIDINE IN NACL 20-0.9 MG/50ML-% IV SOLN
20.0000 mg | Freq: Two times a day (BID) | INTRAVENOUS | Status: AC
  Administered 2018-01-22: 20 mg via INTRAVENOUS
  Filled 2018-01-22: qty 50

## 2018-01-22 MED ORDER — PROTAMINE SULFATE 10 MG/ML IV SOLN
INTRAVENOUS | Status: AC
  Administered 2018-01-22: 25 mg via INTRAVENOUS
  Filled 2018-01-22: qty 5

## 2018-01-22 MED ORDER — OXYCODONE HCL 5 MG PO TABS: 5.0000 mg | ORAL_TABLET | ORAL | Status: DC | PRN

## 2018-01-22 MED ORDER — SODIUM CHLORIDE 0.9 % IV SOLN: Freq: Once | INTRAVENOUS | Status: DC

## 2018-01-22 MED ORDER — PROTAMINE SULFATE 10 MG/ML IV SOLN
INTRAVENOUS | Status: AC
  Filled 2018-01-22: qty 25

## 2018-01-22 MED ORDER — MORPHINE SULFATE (PF) 2 MG/ML IV SOLN: 2.0000 mg | INTRAVENOUS | Status: DC | PRN

## 2018-01-22 MED ORDER — CHLORHEXIDINE GLUCONATE 0.12 % MT SOLN
15.0000 mL | OROMUCOSAL | Status: AC
  Administered 2018-01-22: 15 mL via OROMUCOSAL

## 2018-01-22 MED ORDER — SODIUM CHLORIDE 0.45 % IV SOLN: INTRAVENOUS | Status: DC | PRN

## 2018-01-22 MED ORDER — SODIUM CHLORIDE 0.9 % IV SOLN
INTRAVENOUS | Status: DC
  Filled 2018-01-22: qty 1

## 2018-01-22 MED ORDER — ROCURONIUM BROMIDE 10 MG/ML (PF) SYRINGE
PREFILLED_SYRINGE | INTRAVENOUS | Status: AC
  Filled 2018-01-22: qty 10

## 2018-01-22 MED ORDER — EPHEDRINE 5 MG/ML INJ
INTRAVENOUS | Status: AC
  Filled 2018-01-22: qty 10

## 2018-01-22 MED ORDER — HEPARIN SODIUM (PORCINE) 1000 UNIT/ML IJ SOLN
INTRAMUSCULAR | Status: AC
  Filled 2018-01-22: qty 2

## 2018-01-22 MED ORDER — PROTAMINE SULFATE 10 MG/ML IV SOLN
25.0000 mg | INTRAVENOUS | Status: AC
  Administered 2018-01-22: 25 mg via INTRAVENOUS

## 2018-01-22 MED ORDER — CHLORHEXIDINE GLUCONATE 0.12 % MT SOLN
OROMUCOSAL | Status: AC
  Filled 2018-01-22: qty 15

## 2018-01-22 MED ORDER — DOCUSATE SODIUM 100 MG PO CAPS
200.0000 mg | ORAL_CAPSULE | Freq: Every day | ORAL | Status: DC
  Administered 2018-01-23 – 2018-01-27 (×3): 200 mg via ORAL
  Filled 2018-01-22 (×2): qty 2

## 2018-01-22 MED ORDER — HEMOSTATIC AGENTS (NO CHARGE) OPTIME
TOPICAL | Status: DC | PRN
  Administered 2018-01-22: 1 via TOPICAL

## 2018-01-22 MED ORDER — MORPHINE SULFATE (PF) 4 MG/ML IV SOLN
2.0000 mg | INTRAVENOUS | Status: DC | PRN
  Administered 2018-01-23: 2 mg via INTRAVENOUS
  Filled 2018-01-22 (×3): qty 1

## 2018-01-22 SURGICAL SUPPLY — 107 items
APPLIER CLIP 9.375 SM OPEN (CLIP)
BAG DECANTER FOR FLEXI CONT (MISCELLANEOUS) ×5 IMPLANT
BANDAGE ACE 4X5 VEL STRL LF (GAUZE/BANDAGES/DRESSINGS) ×10 IMPLANT
BANDAGE ACE 6X5 VEL STRL LF (GAUZE/BANDAGES/DRESSINGS) ×5 IMPLANT
BASKET HEART  (ORDER IN 25'S) (MISCELLANEOUS) ×1
BASKET HEART (ORDER IN 25'S) (MISCELLANEOUS) ×1
BASKET HEART (ORDER IN 25S) (MISCELLANEOUS) ×3 IMPLANT
BLADE STERNUM SYSTEM 6 (BLADE) ×5 IMPLANT
BLADE SURG 15 STRL LF DISP TIS (BLADE) IMPLANT
BLADE SURG 15 STRL SS (BLADE)
BNDG GAUZE ELAST 4 BULKY (GAUZE/BANDAGES/DRESSINGS) ×10 IMPLANT
CANISTER SUCT 3000ML PPV (MISCELLANEOUS) ×5 IMPLANT
CANNULA EZ GLIDE 8.0 24FR (CANNULA) ×5 IMPLANT
CANNULA EZ GLIDE AORTIC 21FR (CANNULA) IMPLANT
CATH CPB KIT HENDRICKSON (MISCELLANEOUS) ×5 IMPLANT
CATH ROBINSON RED A/P 18FR (CATHETERS) ×5 IMPLANT
CATH THORACIC 36FR (CATHETERS) ×5 IMPLANT
CATH THORACIC 36FR RT ANG (CATHETERS) ×5 IMPLANT
CLIP APPLIE 9.375 SM OPEN (CLIP) IMPLANT
CLIP FOGARTY SPRING 6M (CLIP) ×5 IMPLANT
CLIP VESOCCLUDE MED 24/CT (CLIP) IMPLANT
CLIP VESOCCLUDE SM WIDE 24/CT (CLIP) ×15 IMPLANT
COVER MAYO STAND STRL (DRAPES) ×5 IMPLANT
CRADLE DONUT ADULT HEAD (MISCELLANEOUS) ×5 IMPLANT
DRAPE CARDIOVASCULAR INCISE (DRAPES) ×2
DRAPE EXTREMITY T 121X128X90 (DRAPE) ×5 IMPLANT
DRAPE HALF SHEET 40X57 (DRAPES) ×5 IMPLANT
DRAPE SLUSH/WARMER DISC (DRAPES) ×10 IMPLANT
DRAPE SRG 135X102X78XABS (DRAPES) ×3 IMPLANT
DRSG COVADERM 4X14 (GAUZE/BANDAGES/DRESSINGS) ×5 IMPLANT
ELECT BLADE 6.5 EXT (BLADE) ×5 IMPLANT
ELECT REM PT RETURN 9FT ADLT (ELECTROSURGICAL) ×10
ELECTRODE REM PT RTRN 9FT ADLT (ELECTROSURGICAL) ×6 IMPLANT
FELT TEFLON 1X6 (MISCELLANEOUS) ×10 IMPLANT
GAUZE SPONGE 4X4 12PLY STRL (GAUZE/BANDAGES/DRESSINGS) ×10 IMPLANT
GAUZE SPONGE 4X4 12PLY STRL LF (GAUZE/BANDAGES/DRESSINGS) ×5 IMPLANT
GEL ULTRASOUND 20GR AQUASONIC (MISCELLANEOUS) IMPLANT
GLOVE BIO SURGEON STRL SZ 6 (GLOVE) ×20 IMPLANT
GLOVE BIO SURGEON STRL SZ 6.5 (GLOVE) ×20 IMPLANT
GLOVE BIO SURGEON STRL SZ7.5 (GLOVE) ×10 IMPLANT
GLOVE BIO SURGEONS STRL SZ 6.5 (GLOVE) ×5
GLOVE BIOGEL PI IND STRL 6 (GLOVE) ×6 IMPLANT
GLOVE BIOGEL PI IND STRL 6.5 (GLOVE) ×12 IMPLANT
GLOVE BIOGEL PI IND STRL 7.0 (GLOVE) ×3 IMPLANT
GLOVE BIOGEL PI INDICATOR 6 (GLOVE) ×4
GLOVE BIOGEL PI INDICATOR 6.5 (GLOVE) ×8
GLOVE BIOGEL PI INDICATOR 7.0 (GLOVE) ×2
GLOVE SURG SIGNA 7.5 PF LTX (GLOVE) ×15 IMPLANT
GOWN STRL REUS W/ TWL LRG LVL3 (GOWN DISPOSABLE) ×21 IMPLANT
GOWN STRL REUS W/ TWL XL LVL3 (GOWN DISPOSABLE) ×24 IMPLANT
GOWN STRL REUS W/TWL LRG LVL3 (GOWN DISPOSABLE) ×14
GOWN STRL REUS W/TWL XL LVL3 (GOWN DISPOSABLE) ×16
HARMONIC SHEARS 14CM COAG (MISCELLANEOUS) IMPLANT
HEMOSTAT POWDER SURGIFOAM 1G (HEMOSTASIS) ×15 IMPLANT
HEMOSTAT SURGICEL 2X14 (HEMOSTASIS) ×5 IMPLANT
INSERT FOGARTY XLG (MISCELLANEOUS) ×5 IMPLANT
KIT BASIN OR (CUSTOM PROCEDURE TRAY) ×5 IMPLANT
KIT ROOM TURNOVER OR (KITS) ×5 IMPLANT
KIT SUCTION CATH 14FR (SUCTIONS) ×10 IMPLANT
KIT VASOVIEW HEMOPRO VH 3000 (KITS) ×5 IMPLANT
MARKER GRAFT CORONARY BYPASS (MISCELLANEOUS) ×15 IMPLANT
NS IRRIG 1000ML POUR BTL (IV SOLUTION) ×35 IMPLANT
PACK E OPEN HEART (SUTURE) ×5 IMPLANT
PACK OPEN HEART (CUSTOM PROCEDURE TRAY) ×5 IMPLANT
PAD ARMBOARD 7.5X6 YLW CONV (MISCELLANEOUS) ×10 IMPLANT
PAD ELECT DEFIB RADIOL ZOLL (MISCELLANEOUS) ×5 IMPLANT
PENCIL BUTTON HOLSTER BLD 10FT (ELECTRODE) ×5 IMPLANT
PUNCH AORTIC ROTATE  4.5MM 8IN (MISCELLANEOUS) ×5 IMPLANT
PUNCH AORTIC ROTATE 4.0MM (MISCELLANEOUS) IMPLANT
PUNCH AORTIC ROTATE 4.5MM 8IN (MISCELLANEOUS) IMPLANT
PUNCH AORTIC ROTATE 5MM 8IN (MISCELLANEOUS) IMPLANT
SET CARDIOPLEGIA MPS 5001102 (MISCELLANEOUS) ×5 IMPLANT
SHEARS HARMONIC 9CM CVD (BLADE) ×10 IMPLANT
SPONGE LAP 18X18 5 PK (GAUZE/BANDAGES/DRESSINGS) ×10 IMPLANT
SUT BONE WAX W31G (SUTURE) ×5 IMPLANT
SUT MNCRL AB 4-0 PS2 18 (SUTURE) IMPLANT
SUT PROLENE 3 0 SH DA (SUTURE) ×5 IMPLANT
SUT PROLENE 4 0 RB 1 (SUTURE) ×4
SUT PROLENE 4 0 SH DA (SUTURE) IMPLANT
SUT PROLENE 4-0 RB1 .5 CRCL 36 (SUTURE) ×6 IMPLANT
SUT PROLENE 6 0 C 1 30 (SUTURE) ×10 IMPLANT
SUT PROLENE 7 0 BV 1 (SUTURE) ×5 IMPLANT
SUT PROLENE 7 0 BV1 MDA (SUTURE) ×10 IMPLANT
SUT PROLENE 8 0 BV175 6 (SUTURE) ×10 IMPLANT
SUT STEEL 6MS V (SUTURE) ×5 IMPLANT
SUT STEEL STERNAL CCS#1 18IN (SUTURE) IMPLANT
SUT STEEL SZ 6 DBL 3X14 BALL (SUTURE) ×5 IMPLANT
SUT VIC AB 1 CTX 36 (SUTURE) ×4
SUT VIC AB 1 CTX36XBRD ANBCTR (SUTURE) ×6 IMPLANT
SUT VIC AB 2-0 CT1 27 (SUTURE) ×6
SUT VIC AB 2-0 CT1 TAPERPNT 27 (SUTURE) ×9 IMPLANT
SUT VIC AB 2-0 CTX 27 (SUTURE) IMPLANT
SUT VIC AB 3-0 SH 27 (SUTURE) ×4
SUT VIC AB 3-0 SH 27X BRD (SUTURE) ×6 IMPLANT
SUT VIC AB 3-0 X1 27 (SUTURE) ×15 IMPLANT
SUT VICRYL 4-0 PS2 18IN ABS (SUTURE) IMPLANT
SYR 50ML SLIP (SYRINGE) IMPLANT
SYSTEM SAHARA CHEST DRAIN ATS (WOUND CARE) ×5 IMPLANT
TAPE CLOTH SURG 4X10 WHT LF (GAUZE/BANDAGES/DRESSINGS) ×5 IMPLANT
TAPE PAPER 2X10 WHT MICROPORE (GAUZE/BANDAGES/DRESSINGS) ×5 IMPLANT
TOWEL GREEN STERILE (TOWEL DISPOSABLE) ×5 IMPLANT
TOWEL GREEN STERILE FF (TOWEL DISPOSABLE) ×5 IMPLANT
TRAY FOLEY SILVER 16FR TEMP (SET/KITS/TRAYS/PACK) ×5 IMPLANT
TUBE FEEDING 8FR 16IN STR KANG (MISCELLANEOUS) ×5 IMPLANT
TUBING INSUFFLATION (TUBING) ×5 IMPLANT
UNDERPAD 30X30 (UNDERPADS AND DIAPERS) ×5 IMPLANT
WATER STERILE IRR 1000ML POUR (IV SOLUTION) ×10 IMPLANT

## 2018-01-22 NOTE — Progress Notes (Signed)
Dr. Dorris FetchHendrickson notified of cardiac index 1.5 x2 checks for the last hour. Vital signs otherwise stable. Orders received to administer albumin and increase pacing HR from 80 to 90. Will continue to monitor.  Leanna BattlesEckelmann, Teon Hudnall Eileen, RN

## 2018-01-22 NOTE — Anesthesia Procedure Notes (Signed)
Central Venous Catheter Insertion Performed by: Cecile Hearingurk, Florena Kozma Edward, MD, anesthesiologist Start/End03/09/2018 6:36 AM, 01/22/2018 6:46 AM Patient location: Pre-op. Preanesthetic checklist: patient identified, IV checked, site marked, risks and benefits discussed, surgical consent, monitors and equipment checked, pre-op evaluation, timeout performed and anesthesia consent Position: Trendelenburg Lidocaine 1% used for infiltration and patient sedated Hand hygiene performed , maximum sterile barriers used  and Seldinger technique used Catheter size: 8.5 Fr Total catheter length 10. Central line was placed.Sheath introducer Swan type:thermodilution PA Cath depth:50 Procedure performed using ultrasound guided technique. Ultrasound Notes:anatomy identified, needle tip was noted to be adjacent to the nerve/plexus identified, no ultrasound evidence of intravascular and/or intraneural injection and image(s) printed for medical record Attempts: 1 Following insertion, line sutured, dressing applied and Biopatch. Post procedure assessment: blood return through all ports, free fluid flow and no air  Patient tolerated the procedure well with no immediate complications.

## 2018-01-22 NOTE — Progress Notes (Signed)
      [address]       [place] [id number]             [id number]      S/p CABG x 4  Intubated  BP 103/68   Pulse 89   Temp 97.7 F (36.5 C)   Resp 20   Ht 6\' 2"  (1.88 m)   Wt (!) 336 lb (152.4 kg)   SpO2 100%   BMI 43.14 kg/m   Intake/Output Summary (Last 24 hours) at 01/26/2018 1757 Last data filed at 02/18/2018 1704 Gross per 24 hour  Intake 4080.37 ml  Output 1583 ml  Net 2497.37 ml   CI= 1.89 Hct=26  Doing well early postop  Viviann SpareSteven C. Dorris FetchHendrickson, MD [facility] [phone]

## 2018-01-22 NOTE — Brief Op Note (Signed)
02/18/2018  5:15 PM  PATIENT:  Nicholas Mejia  47 y.o. male  PRE-OPERATIVE DIAGNOSIS:  CAD WITH LEFT MAIN DISEASE  POST-OPERATIVE DIAGNOSIS:  CAD WITH LEFT MAIN DISEASE  PROCEDURE:  Procedure(s): CORONARY ARTERY BYPASS GRAFTING (CABG) TIMES Four,  USING LEFT INTERNAL MAMMARY ARTERY, RIGHT RADIAL ARTERY AND RIGHT SAPHENOUS LEG VEIN HARVESTED ENDOSCOPICALLY (N/A) RADIAL ARTERY HARVEST (Right) TRANSESOPHAGEAL ECHOCARDIOGRAM (TEE) (N/A) LIMA-LAD SEQ SVG-AM-RCA RIGHT RADIAL -RAMUS  SURGEON:  Surgeon(s) and Role:    * Loreli SlotHendrickson, Steven C, MD - Primary  PHYSICIAN ASSISTANT: WAYNE GOLD PA-C  ANESTHESIA:   general  EBL:  300 mL   BLOOD ADMINISTERED:none  DRAINS: PLEURAL AND MEDIASTINAL CHEST TUBES   LOCAL MEDICATIONS USED:  NONE  SPECIMEN:  No Specimen  DISPOSITION OF SPECIMEN:  N/A  COUNTS:  YES  TOURNIQUET:  * No tourniquets in log *  DICTATION: .Other Dictation: Dictation Number PENDING  PLAN OF CARE: Admit to inpatient   PATIENT DISPOSITION:  ICU - intubated and hemodynamically stable.   Delay start of Pharmacological VTE agent (>24hrs) due to surgical blood loss or risk of bleeding: yes  XC= 83 min CPB= 117 min

## 2018-01-22 NOTE — Procedures (Signed)
Extubation Procedure Note  Cardiac Rapid Wean completed w/o event. Pt follows all commands. ABG normal. NIF -36, VC 1L, cuff leak +  Extubated to 2L [place]. No stridor. A&O, Clear BBS  Patient Details:   Name: Nicholas Mejia DOB: Sep 01, 1971 MRN: [id number]   Airway Documentation:     Evaluation  O2 sats: stable throughout Complications: No apparent complications Patient did tolerate procedure well. Bilateral Breath Sounds: Clear   Yes  Lianne BushyDaniel, Lia Vigilante C 02/04/2018, 7:44 PM

## 2018-01-22 NOTE — Progress Notes (Signed)
RT note: rapid wean protocol initiated at 1835.

## 2018-01-22 NOTE — Anesthesia Procedure Notes (Signed)
Arterial Line Insertion Start/End03/06/2018 7:00 AM, 01/22/2018 7:15 AM Performed by: Nils Pyle, CRNA, CRNA  Patient location: Pre-op. Preanesthetic checklist: patient identified, IV checked, site marked, risks and benefits discussed, surgical consent, monitors and equipment checked, pre-op evaluation and anesthesia consent Lidocaine 1% used for infiltration and patient sedated Left, radial was placed Catheter size: 20 G Hand hygiene performed  and maximum sterile barriers used   Attempts: 1 Procedure performed without using ultrasound guided technique. Ultrasound Notes:anatomy identified, needle tip was noted to be adjacent to the nerve/plexus identified and no ultrasound evidence of intravascular and/or intraneural injection Following insertion, dressing applied and Biopatch. Post procedure assessment: normal  Patient tolerated the procedure well with no immediate complications.

## 2018-01-22 NOTE — Anesthesia Procedure Notes (Signed)
Central Venous Catheter Insertion Performed by: Cecile Hearingurk, Jalon Squier Edward, MD, anesthesiologist Start/End03/16/2019 6:46 AM, 01/22/2018 6:51 AM Patient location: Pre-op. Preanesthetic checklist: patient identified, IV checked, site marked, risks and benefits discussed, surgical consent, monitors and equipment checked, pre-op evaluation, timeout performed and anesthesia consent Hand hygiene performed  and maximum sterile barriers used  Total catheter length 50. PA cath was placed.Swan type:thermodilution PA Cath depth:100 Procedure performed without using ultrasound guided technique. Attempts: 1 Patient tolerated the procedure well with no immediate complications.

## 2018-01-22 NOTE — Interval H&P Note (Signed)
History and Physical Interval Note:  02/05/2018 7:19 AM  Nicholas Mejia  has presented today for surgery, with the diagnosis of CAD WITH LEFT MAIN DISEASE  The various methods of treatment have been discussed with the patient and family. After consideration of risks, benefits and other options for treatment, the patient has consented to  Procedure(s): CORONARY ARTERY BYPASS GRAFTING (CABG) (N/A) RADIAL ARTERY HARVEST (Right) TRANSESOPHAGEAL ECHOCARDIOGRAM (TEE) (N/A) as a surgical intervention .  The patient's history has been reviewed, patient examined, no change in status, stable for surgery.  I have reviewed the patient's chart and labs.  Questions were answered to the patient's satisfaction.     Loreli SlotSteven C Bliss Behnke

## 2018-01-22 NOTE — Progress Notes (Signed)
Dr. Dorris FetchHendrickson notified of patient having 250 ml CT output (mostly from MT) since arrival to unit approx 45 minutes ago, including many very large clots. Vitals are stable. New orders received to adjust PEEP (RT notified) and administer protamine. Will continue to monitor.  Leanna BattlesEckelmann, Veora Fonte Eileen, RN

## 2018-01-22 NOTE — Transfer of Care (Signed)
Immediate Anesthesia Transfer of Care Note  Patient: Nicholas Mejia  Procedure(s) Performed: CORONARY ARTERY BYPASS GRAFTING (CABG) TIMES Four,  USING LEFT INTERNAL MAMMARY ARTERY, RIGHT RADIAL ARTERY AND RIGHT SAPHENOUS LEG VEIN HARVESTED ENDOSCOPICALLY (N/A Chest) RADIAL ARTERY HARVEST (Right ) TRANSESOPHAGEAL ECHOCARDIOGRAM (TEE) (N/A )  Patient Location: SICU  Anesthesia Type:General  Level of Consciousness: sedated, unresponsive and Patient remains intubated per anesthesia plan  Airway & Oxygen Therapy: Patient remains intubated per anesthesia plan and Patient placed on Ventilator (see vital sign flow sheet for setting)  Post-op Assessment: Report given to RN and Post -op Vital signs reviewed and stable  Post vital signs: Reviewed and stable  Last Vitals:  Vitals:   12-03-2017 0606  BP: (!) 170/78  Pulse: 93  Resp: 20  Temp: 36.8 C  SpO2: 99%    Last Pain:  Vitals:   12-03-2017 0606  TempSrc: Oral  PainSc: 6          Complications: No apparent anesthesia complications

## 2018-01-22 NOTE — Anesthesia Postprocedure Evaluation (Signed)
Anesthesia Post Note  Patient: Nicholas Mejia  Procedure(s) Performed: CORONARY ARTERY BYPASS GRAFTING (CABG) TIMES Four,  USING LEFT INTERNAL MAMMARY ARTERY, RIGHT RADIAL ARTERY AND RIGHT SAPHENOUS LEG VEIN HARVESTED ENDOSCOPICALLY (N/A Chest) RADIAL ARTERY HARVEST (Right ) TRANSESOPHAGEAL ECHOCARDIOGRAM (TEE) (N/A )     Patient location during evaluation: SICU Anesthesia Type: General Level of consciousness: sedated Pain management: pain level controlled Vital Signs Assessment: post-procedure vital signs reviewed and stable Respiratory status: patient remains intubated per anesthesia plan Cardiovascular status: stable Postop Assessment: no apparent nausea or vomiting Anesthetic complications: no    Last Vitals:  Vitals:   09/04/2018 0606 09/04/2018 1458  BP: (!) 170/78 112/62  Pulse: 93 80  Resp: 20 12  Temp: 36.8 C   SpO2: 99% 100%    Last Pain:  Vitals:   09/04/2018 0606  TempSrc: Oral  PainSc: 6                  Nicholas Mejia, Nicholas Mejia

## 2018-01-22 NOTE — Anesthesia Procedure Notes (Signed)
Procedure Name: Intubation Date/Time: 01/26/2018 8:00 AM Performed by: Nils PyleBell, Merilee Wible T, CRNA Pre-anesthesia Checklist: Patient identified, Emergency Drugs available, Suction available and Patient being monitored Patient Re-evaluated:Patient Re-evaluated prior to induction Oxygen Delivery Method: Circle System Utilized Preoxygenation: Pre-oxygenation with 100% oxygen Induction Type: IV induction and Rapid sequence Ventilation: Mask ventilation with difficulty, Oral airway inserted - appropriate to patient size and Two handed mask ventilation required Laryngoscope Size: Miller and 2 Grade View: Grade I Tube type: Oral Tube size: 7.5 mm Number of attempts: 1 Airway Equipment and Method: Stylet and Oral airway Placement Confirmation: ETT inserted through vocal cords under direct vision,  positive ETCO2 and breath sounds checked- equal and bilateral Secured at: 23 cm Tube secured with: Tape Dental Injury: Teeth and Oropharynx as per pre-operative assessment

## 2018-01-23 ENCOUNTER — Other Ambulatory Visit: Payer: Self-pay

## 2018-01-23 ENCOUNTER — Inpatient Hospital Stay: Payer: Self-pay

## 2018-01-23 ENCOUNTER — Inpatient Hospital Stay (HOSPITAL_COMMUNITY): Payer: BLUE CROSS/BLUE SHIELD

## 2018-01-23 ENCOUNTER — Encounter (HOSPITAL_COMMUNITY): Payer: Self-pay | Admitting: Radiology

## 2018-01-23 DIAGNOSIS — I639 Cerebral infarction, unspecified: Secondary | ICD-10-CM

## 2018-01-23 LAB — POCT I-STAT, CHEM 8
BUN: 23 mg/dL — ABNORMAL HIGH (ref 6–20)
Calcium, Ion: 1.09 mmol/L — ABNORMAL LOW (ref 1.15–1.40)
Chloride: 102 mmol/L (ref 101–111)
Creatinine, Ser: 1.3 mg/dL — ABNORMAL HIGH (ref 0.61–1.24)
GLUCOSE: 153 mg/dL — AB (ref 65–99)
HCT: 23 % — ABNORMAL LOW (ref 39.0–52.0)
HEMOGLOBIN: 7.8 g/dL — AB (ref 13.0–17.0)
POTASSIUM: 4.7 mmol/L (ref 3.5–5.1)
Sodium: 136 mmol/L (ref 135–145)
TCO2: 23 mmol/L (ref 22–32)

## 2018-01-23 LAB — POCT I-STAT 3, ART BLOOD GAS (G3+)
ACID-BASE DEFICIT: 3 mmol/L — AB (ref 0.0–2.0)
Bicarbonate: 22.2 mmol/L (ref 20.0–28.0)
O2 Saturation: 99 %
PCO2 ART: 41.3 mmHg (ref 32.0–48.0)
PO2 ART: 135 mmHg — AB (ref 83.0–108.0)
TCO2: 23 mmol/L (ref 22–32)
pH, Arterial: 7.34 — ABNORMAL LOW (ref 7.350–7.450)

## 2018-01-23 LAB — GLUCOSE, CAPILLARY
GLUCOSE-CAPILLARY: 119 mg/dL — AB (ref 65–99)
GLUCOSE-CAPILLARY: 127 mg/dL — AB (ref 65–99)
GLUCOSE-CAPILLARY: 147 mg/dL — AB (ref 65–99)
GLUCOSE-CAPILLARY: 154 mg/dL — AB (ref 65–99)
GLUCOSE-CAPILLARY: 114 mg/dL — AB (ref 65–99)
GLUCOSE-CAPILLARY: 129 mg/dL — AB (ref 65–99)
GLUCOSE-CAPILLARY: 133 mg/dL — AB (ref 65–99)
GLUCOSE-CAPILLARY: 131 mg/dL — AB (ref 65–99)
GLUCOSE-CAPILLARY: 127 mg/dL — AB (ref 65–99)
GLUCOSE-CAPILLARY: 167 mg/dL — AB (ref 65–99)
Glucose-Capillary: 100 mg/dL — ABNORMAL HIGH (ref 65–99)
Glucose-Capillary: 105 mg/dL — ABNORMAL HIGH (ref 65–99)
Glucose-Capillary: 109 mg/dL — ABNORMAL HIGH (ref 65–99)
Glucose-Capillary: 114 mg/dL — ABNORMAL HIGH (ref 65–99)
Glucose-Capillary: 118 mg/dL — ABNORMAL HIGH (ref 65–99)
Glucose-Capillary: 120 mg/dL — ABNORMAL HIGH (ref 65–99)
Glucose-Capillary: 123 mg/dL — ABNORMAL HIGH (ref 65–99)
Glucose-Capillary: 126 mg/dL — ABNORMAL HIGH (ref 65–99)
Glucose-Capillary: 150 mg/dL — ABNORMAL HIGH (ref 65–99)
Glucose-Capillary: 169 mg/dL — ABNORMAL HIGH (ref 65–99)
Glucose-Capillary: 97 mg/dL (ref 65–99)

## 2018-01-23 LAB — MAGNESIUM
Magnesium: 2.4 mg/dL (ref 1.7–2.4)
Magnesium: 2.5 mg/dL — ABNORMAL HIGH (ref 1.7–2.4)

## 2018-01-23 LAB — CREATININE, SERUM
CREATININE: 1.34 mg/dL — AB (ref 0.61–1.24)
GFR calc Af Amer: >60 mL/min (ref >60)
GFR calc non Af Amer: >60 mL/min (ref >60)

## 2018-01-23 LAB — CBC
HCT: 24.3 % — ABNORMAL LOW (ref 39.0–52.0)
HCT: 23.1 % — ABNORMAL LOW (ref 39.0–52.0)
Hemoglobin: 7.8 g/dL — ABNORMAL LOW (ref 13.0–17.0)
Hemoglobin: 7.3 g/dL — ABNORMAL LOW (ref 13.0–17.0)
MCH: 28.9 pg (ref 26.0–34.0)
MCH: 29 pg (ref 26.0–34.0)
MCHC: 32.1 g/dL (ref 30.0–36.0)
MCHC: 31.6 g/dL (ref 30.0–36.0)
MCV: 90 fL (ref 78.0–100.0)
MCV: 91.7 fL (ref 78.0–100.0)
PLATELETS: 243 10*3/uL (ref 150–400)
PLATELETS: 209 10*3/uL (ref 150–400)
RBC: 2.7 MIL/uL — ABNORMAL LOW (ref 4.22–5.81)
RBC: 2.52 MIL/uL — AB (ref 4.22–5.81)
RDW: 15.1 % (ref 11.5–15.5)
RDW: 15.7 % — ABNORMAL HIGH (ref 11.5–15.5)
WBC: 18.9 10*3/uL — AB (ref 4.0–10.5)
WBC: 17.8 10*3/uL — ABNORMAL HIGH (ref 4.0–10.5)

## 2018-01-23 LAB — BASIC METABOLIC PANEL
ANION GAP: 10 (ref 5–15)
BUN: 22 mg/dL — ABNORMAL HIGH (ref 6–20)
CALCIUM: 7.8 mg/dL — AB (ref 8.9–10.3)
CO2: 21 mmol/L — ABNORMAL LOW (ref 22–32)
Chloride: 105 mmol/L (ref 101–111)
Creatinine, Ser: 1.23 mg/dL (ref 0.61–1.24)
GFR calc Af Amer: >60 mL/min (ref >60)
GLUCOSE: 118 mg/dL — AB (ref 65–99)
Potassium: 4.9 mmol/L (ref 3.5–5.1)
Sodium: 136 mmol/L (ref 135–145)

## 2018-01-23 MED ORDER — STROKE: EARLY STAGES OF RECOVERY BOOK
Freq: Once | Status: AC
  Administered 2018-01-23: 22:00:00
  Filled 2018-01-23: qty 1

## 2018-01-23 MED ORDER — INSULIN DETEMIR 100 UNIT/ML ~~LOC~~ SOLN
25.0000 [IU] | Freq: Two times a day (BID) | SUBCUTANEOUS | Status: DC
  Administered 2018-01-23 (×2): 25 [IU] via SUBCUTANEOUS
  Filled 2018-01-23 (×3): qty 0.25

## 2018-01-23 MED ORDER — IOPAMIDOL (ISOVUE-370) INJECTION 76%
INTRAVENOUS | Status: AC
  Administered 2018-01-23: 50 mL
  Filled 2018-01-23: qty 50

## 2018-01-23 MED ORDER — HYDRALAZINE HCL 20 MG/ML IJ SOLN
10.0000 mg | Freq: Four times a day (QID) | INTRAMUSCULAR | Status: DC | PRN
  Administered 2018-01-26 – 2018-01-27 (×2): 10 mg via INTRAVENOUS
  Filled 2018-01-23 (×4): qty 1

## 2018-01-23 MED ORDER — INSULIN ASPART 100 UNIT/ML ~~LOC~~ SOLN
0.0000 [IU] | SUBCUTANEOUS | Status: DC
  Administered 2018-01-23: 2 [IU] via SUBCUTANEOUS
  Administered 2018-01-23: 4 [IU] via SUBCUTANEOUS
  Administered 2018-01-23 – 2018-01-24 (×2): 2 [IU] via SUBCUTANEOUS
  Administered 2018-01-24: 8 [IU] via SUBCUTANEOUS
  Administered 2018-01-24 (×2): 4 [IU] via SUBCUTANEOUS
  Administered 2018-01-24: 2 [IU] via SUBCUTANEOUS
  Administered 2018-01-24: 4 [IU] via SUBCUTANEOUS
  Administered 2018-01-25 – 2018-01-27 (×10): 2 [IU] via SUBCUTANEOUS

## 2018-01-23 MED ORDER — CHLORHEXIDINE GLUCONATE CLOTH 2 % EX PADS
6.0000 | MEDICATED_PAD | Freq: Every day | CUTANEOUS | Status: DC
  Administered 2018-01-24 – 2018-02-13 (×21): 6 via TOPICAL

## 2018-01-23 MED ORDER — SODIUM CHLORIDE 0.9% FLUSH
10.0000 mL | Freq: Two times a day (BID) | INTRAVENOUS | Status: DC
  Administered 2018-01-23: 10 mL
  Administered 2018-01-24: 30 mL

## 2018-01-23 MED ORDER — SODIUM CHLORIDE 0.9% FLUSH
10.0000 mL | INTRAVENOUS | Status: DC | PRN
  Administered 2018-02-03: 10 mL
  Administered 2018-02-08: 30 mL
  Filled 2018-01-23 (×2): qty 40

## 2018-01-23 MED ORDER — FUROSEMIDE 10 MG/ML IJ SOLN
40.0000 mg | Freq: Once | INTRAMUSCULAR | Status: AC
  Administered 2018-01-23: 40 mg via INTRAVENOUS
  Filled 2018-01-23: qty 4

## 2018-01-23 MED ORDER — ENOXAPARIN SODIUM 40 MG/0.4ML ~~LOC~~ SOLN
40.0000 mg | Freq: Every day | SUBCUTANEOUS | Status: DC
  Administered 2018-01-23 – 2018-02-12 (×21): 40 mg via SUBCUTANEOUS
  Filled 2018-01-23 (×23): qty 0.4

## 2018-01-23 MED ORDER — LACTATED RINGERS IV SOLN: INTRAVENOUS | Status: DC

## 2018-01-23 MED ORDER — ISOSORBIDE MONONITRATE ER 30 MG PO TB24
30.0000 mg | ORAL_TABLET | Freq: Every day | ORAL | Status: DC
  Administered 2018-01-23: 30 mg via ORAL
  Filled 2018-01-23: qty 1

## 2018-01-23 NOTE — Op Note (Signed)
NAMEMarland Kitchen  HANIEL, FIX NO.:  [id number]  MEDICAL RECORD NO.:  [id number]  LOCATION:                                 FACILITY:  PHYSICIAN:  Salvatore Decent. Dorris Fetch, M.D.DATE OF BIRTH:  May 26, 1971  DATE OF PROCEDURE:  02/16/2018 DATE OF DISCHARGE:                              OPERATIVE REPORT   PREOPERATIVE DIAGNOSIS:  Three-vessel coronary artery disease.  POSTOPERATIVE DIAGNOSIS:  Three-vessel coronary artery disease.  PROCEDURE:   Median sternotomy, extracorporeal circulation, Coronary artery bypass grafting x 4  Left internal mammary artery to LAD,  Right radial artery to ramus intermedius 1,  Sequential saphenous vein graft to acute marginal and posterior descending Endoscopic vein harvest right thigh.  SURGEON:  Salvatore Decent. Dorris Fetch, M.D.  ASSISTANT:  Rowe Clack, P.A.-C.  ANESTHESIA:  General.  FINDINGS:  Morbid obesity.  Radial artery, vein and mammary artery all good quality conduits. Fair quality targets.  Preserved left ventricular function with no significant valvular pathology by transesophageal echocardiogram.  CLINICAL NOTE:  [name] is a 47 year old man with multiple cardiac risk factors.  In December, he began feeling short of breath and fatigued. He was diagnosed with congestive heart failure.  A nuclear stress study showed a large scar with peri-infarct ischemia and an ejection fraction of 40%.  Cardiac catheterization revealed a 70% left main stenosis. There was an 80% stenosis in a diffusely diseased right coronary.  He was referred for coronary artery bypass grafting.  The indications, risks, benefits, and alternatives were discussed in detail with the patient.  He understood and accepted the risks and agreed to proceed.  OPERATIVE NOTE:  [name] was brought to the preoperative holding area on January 22, 2018.  Anesthesia placed a Swan-Ganz catheter and an arterial blood pressure monitoring line.  He was taken to the  operating room, anesthetized, and intubated.  A Foley catheter was placed. Intravenous antibiotics were administered.  Transesophageal echocardiography was performed by Dr. Marcene Duos.  Please see a separately dictated note for full details.  Briefly, there was preserved left ventricular function, left ventricular hypertrophy, and no significant valvular pathology.  The chest, abdomen, legs, and right arm were prepped and draped in usual sterile fashion.  The saphenous vein and radial artery were harvested simultaneously.  Incision was made in the medial aspect of the right leg at the level of the knee.  The greater saphenous vein was identified.  It was harvested endoscopically from the thigh, and was of good quality.  Simultaneously, an incision was made over the volar aspect of the right wrist.  The fascia was incised exposing the radial artery.  A short segment of the radial artery was dissected out distally.  There was a good pulse distally with proximal occlusion and there was a good Doppler signal in the palmar arch.  The incision was extended to just below the antecubital fossa and the radial artery was harvested using the Harmonic scalpel.  2000 units of heparin was administered during the vessel harvest.  The incisions were closed in standard fashion.  The right arm was wrapped and tucked at the patient's side.  A median sternotomy was performed.  The  left internal mammary artery was harvested using standard technique.  This was extraordinarily difficult due to the patient's body habitus, but the mammary was a good quality vessel with excellent flow when divided distally.  The remainder of the full heparin dose was administered.  The pericardium was opened after confirming adequate anticoagulation with ACT measurement.  The aorta was cannulated via concentric 2-0 Ethibond pledgeted purse-string sutures.  The aorta was of normal caliber.  There was marked cardiomegaly.   A dual-stage venous cannula was placed via a purse-string suture in the right atrial appendage. Cardiopulmonary bypass was initiated.  Flows were maintained per protocol.  The patient was cooled to 32 degrees Celsius.  The coronary arteries were inspected and anastomotic sites were chosen.  The ramus branches were deeply intramyocardial and were not dissected out at this time.  The conduits were inspected and cut to length.  A foam pad was placed in the pericardium.  A temperature probe was placed in the myocardial septum and a cardioplegia cannula was placed in the ascending aorta.  The aorta was crossclamped.  The left ventricle was emptied via the aortic root vent.  Cardiac arrest then was achieved with combination of cold antegrade blood cardioplegia and topical iced saline.  1.5 L of cardioplegia was administered.  There was a rapid diastolic arrest and there was septal cooling to 10 degrees Celsius.  The inferior wall of the heart was inspected.  The only graftable vessels in that region were the acute marginal branch and the distal right/proximal posterior descending.  None of the posterolateral branches were large enough to graft as separate targets.  The vein graft was anastomosed sequentially to the acute marginal and the distal right with running 7-0 Prolene sutures used for the anastomosis.  A side- to-side was done to the acute marginal and an end-to-side to the distal right.  Both anastomoses probed easily proximally and distally at their completion.  Cardioplegia was administered down the graft.  There was good flow and good hemostasis.  Additional cardioplegia was also administered via the aortic root.  The heart was elevated exposing the lateral wall.  There was a faint hint of where the 1st branch of the bifurcating ramus intermedius was. It was dissected out.  It was deeply intramyocardial. It was a 1.5-mm good quality target vessel at the site of anastomosis. There  was some disease distally as well as proximally.  There was no hint as to where the 2nd parallel branch was and the myocardium was scored in several places attempting to localize this, but it could not be localized and the decision was made to graft only the 1st branch.  The distal end of the radial artery was beveled.  The radial was a 2-mm good quality conduit.  It was anastomosed end-to-side to this ramus branch with a running 8-0 Prolene suture.  A probe passed easily proximally and distally. With cardioplegia administration down the graft, there was good flow and good hemostasis.  Again, additional cardioplegia was administered via the aortic root as well.  The left internal mammary artery was brought through an incision in the pericardium.  The distal end was beveled.  It was a 1.5-mm good quality conduit.  It was anastomosed end-to-side to the distal LAD.  The LAD was a 1.5-mm fair quality target vessel.  A probe did pass distally.  The anastomosis was performed with a running 8-0 Prolene suture.  At the completion of the mammary to LAD anastomosis, the bulldog clamp was  briefly removed to inspect for hemostasis.  Septal rewarming was noted. The bulldog clamp was replaced, and the mammary pedicle was tacked to the epicardial surface of the heart with 6-0 Prolene sutures.  Additional cardioplegia was administered.  The vein graft and radial were cut to length.  The cardioplegia cannula was removed from the ascending aorta.  The proximal anastomoses were performed to 4.5 mm punch aortotomies with running 6-0 Prolene sutures.  At the completion of the final proximal anastomosis, the patient was placed in Trendelenburg position.  Lidocaine was administered.  The aortic root was de-aired and the aortic crossclamp was removed.  The total crossclamp time was 83 minutes.  While rewarming was completed, all proximal and distal anastomoses were inspected for hemostasis. Epicardial pacing  wires were placed on the right ventricle and right atrium.  The patient was bradycardic.  DDD pacing was initiated at 80 beats per minute.  When he had rewarmed to a core temperature of 37 degrees Celsius, he was weaned from cardiopulmonary bypass on the first attempt.  The total bypass time was 117 minutes.  The initial cardiac index was greater than 2 L/min per m2, and the patient remained hemodynamically stable throughout the postbypass period.  A test dose of protamine was administered and was well tolerated.  The atrial and aortic cannulae were removed.  The remainder of the protamine was administered without incident.  The chest was irrigated with warm saline.  Hemostasis was achieved.  The pericardium was not reapproximated.  Left pleural and mediastinal chest tubes were placed through separate subcostal incisions.  The sternum was closed with a combination of single and double heavy gauge stainless steel wires.  The pectoralis fascia, subcutaneous tissue, and skin were closed in standard fashion.  All sponge, needle, and instrument counts were correct at the end of the procedure.  The patient was taken from the operating room to the Surgical Intensive Care Unit intubated and in good condition.     Salvatore DecentSteven C. Dorris FetchHendrickson, M.D.     SCH/MEDQ  D:  02/06/2018  T:  01/23/2018  Job:  [id number]

## 2018-01-23 NOTE — Plan of Care (Signed)
1550  To xray via bed, monitor and O2 1620 from xray same fashion

## 2018-01-23 NOTE — Progress Notes (Signed)
      [address]       [place] [id number]             [phone]      Returned to check on Nicholas Mejia  Per RN and family just a few minutes ago he was a little more alert and answering questions.   He does follow simple commands and is stronger on right than left. 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   I was hoping to see more progress over the morning  Will check a head CT and ask Neuro to see  Viviann SpareSteven C. Dorris FetchHendrickson, MD [facility] [phone]

## 2018-01-23 NOTE — Progress Notes (Signed)
Peripherally Inserted Central Catheter/Midline Placement  The IV Nurse has discussed with the patient and/or persons authorized to consent for the patient, the purpose of this procedure and the potential benefits and risks involved with this procedure.  The benefits include less needle sticks, lab draws from the catheter, and the patient may be discharged home with the catheter. Risks include, but not limited to, infection, bleeding, blood clot (thrombus formation), and puncture of an artery; nerve damage and irregular heartbeat and possibility to perform a PICC exchange if needed/ordered by physician.  Alternatives to this procedure were also discussed.  Bard Power PICC patient education guide, fact sheet on infection prevention and patient information card has been provided to patient /or left at bedside.    PICC/Midline Placement Documentation  PICC Double Lumen 01/23/18 PICC Left Basilic 50 cm 1 cm (Active)  Indication for Insertion or Continuance of Line Prolonged intravenous therapies;Limited venous access - need for IV therapy >5 days (PICC only) 01/23/2018  6:23 PM  Exposed Catheter (cm) 1 cm 01/23/2018  6:23 PM  Site Assessment Clean;Dry;Intact 01/23/2018  6:23 PM  Lumen #1 Status Flushed;Saline locked;Blood return noted 01/23/2018  6:23 PM  Lumen #2 Status Flushed;Saline locked;Blood return noted 01/23/2018  6:23 PM  Dressing Type Transparent 01/23/2018  6:23 PM  Dressing Status Clean;Dry;Intact;Antimicrobial disc in place 01/23/2018  6:23 PM  Dressing Change Due 01/30/18 01/23/2018  6:23 PM       Lattie Cervi, Lajean ManesKerry Loraine 01/23/2018, 6:24 PM

## 2018-01-23 NOTE — Consult Note (Signed)
Requesting Physician: Dr. Weyman Croon    Chief Complaint: Left side weakness  History obtained from: Patient and Chart    HPI:                                                                                                                                       Nicholas Mejia is an 47 y.o. male with PMH of CAD, Diabetes Mellitus, HTN, Morbid obesity, OSA Admitted for CABG. Patient underwent CABG procedure on 3.1.19 and has remained lethargic since then. Today patient was more alert, however noted to not have been moving left side and Neurology was consulted for concern of stroke.   CT head showed new left PICA infarct, CTA showed severe R m1 stenosis. CT perfusion was done due to concern for LVO which was negative for large infarct/penumbra in R MCA territory.  Date last known well: 2018/01/28 Time last known well: morning tPA Given: no, outside window NIHSS:  Baseline MRS   Past Medical History:  Diagnosis Date  . CAD (coronary artery disease)   . Complication of anesthesia   . Diabetes mellitus without complication (HCC)   . Dyspnea    on exertion  . GERD (gastroesophageal reflux disease)   . Hypertension   . Ischemic cardiomyopathy   . Morbid obesity (HCC)   . Pneumonia   . Sleep apnea    pt. has not received his cpap yet  . Stroke [facility]) 12/30/2016    Past Surgical History:  Procedure Laterality Date  . EXTERNAL EAR SURGERY  1995   right ear -removed keloid  . LEFT HEART CATH AND CORONARY ANGIOGRAPHY N/A 12/22/2017   Procedure: LEFT HEART CATH AND CORONARY ANGIOGRAPHY;  Surgeon: Elder Negus, MD;  Location: [facility];  Service: Cardiovascular;  Laterality: N/A;    Family History  Problem Relation Age of Onset  . Heart attack Father   . Gout Brother    Social History:  reports that he quit smoking about 15 months ago. His smoking use included cigars. he has never used smokeless tobacco. He reports that he drinks alcohol. He reports that he uses drugs. Drug:  Marijuana.  Allergies:  Allergies  Allergen Reactions  . Bee Venom Anaphylaxis and Swelling  . Metoprolol Shortness Of Breath  . Penicillins Swelling    Swelling in feet Has patient had a PCN reaction causing immediate rash, facial/tongue/throat swelling, SOB or lightheadedness with hypotension: No Has patient had a PCN reaction causing severe rash involving mucus membranes or skin necrosis: No Has patient had a PCN reaction that required hospitalization: No Has patient had a PCN reaction occurring within the last 10 years: No If all of the above answers are "NO", then may proceed with Cephalosporin use.  . Pollen Extract     UNSPECIFIED REACTION     Medications:  I reviewed home medications   ROS:                                                                                                                                     14 systems reviewed and negative except above    Examination:                                                                                                      General: Appears well-developed and well-nourished.  Psych: Affect appropriate to situation Eyes: No scleral injection HENT: No OP obstrucion Head: Normocephalic.  Cardiovascular: Normal rate and regular rhythm.  Respiratory: Effort normal and breath sounds normal to anterior ascultation GI: Soft.  No distension. There is no tenderness.  Skin: WDI   Neurological Examination Mental Status: Somnolent, dysarthric, answers questions appropriately but intermittent Cranial Nerves: II: Visual fields grossly normal: difficult to assess, appears to neglect left side  III,IV, VI: ptosis not present, extra-ocular motions intact bilaterally, pupils equal, round, reactive to light and accommodation V,VII: smile symmetric, facial light touch sensation normal bilaterally VIII: hearing  normal bilaterally IX,X: uvula rises symmetrically XI: bilateral shoulder shrug XII: midline tongue extension Motor: Right : Upper extremity   5/5    Left:     Upper extremity   5/5  Lower extremity   5/5     Lower extremity   5/5 Tone and bulk:normal tone throughout; no atrophy noted Sensory: Pinprick and light touch intact throughout, bilaterally Deep Tendon Reflexes: 2+ and symmetric throughout Plantars: Right: downgoing   Left: downgoing Cerebellar: normal finger-to-nose, normal rapid alternating movements and normal heel-to-shin test Gait: normal gait and station     Lab Results: Basic Metabolic Panel: Recent Labs  Lab 01/20/18 0850  02/21/2018 1245 01/23/2018 1345 02/18/2018 1923 01/26/2018 2034 01/23/18 0420 01/23/18 1521 01/23/18 1525  NA 130*   < > 136 137  --  138 136 136  --   K 4.3   < > 4.6 4.2  --  4.5 4.9 4.7  --   CL 97*   < > 101 100*  --  103 105 102  --   CO2 21*  --   --   --   --   --  21*  --   --   GLUCOSE 149*   < > 216* 207*  --  125* 118* 153*  --   BUN 20   < > 21* 21*  --  21* 22* 23*  --  CREATININE 1.28*   < > 1.20 1.20 1.27* 1.20 1.23 1.30* 1.34*  CALCIUM 8.9  --   --   --   --   --  7.8*  --   --   MG  --   --   --   --  2.6*  --  2.4  --  2.5*   < > = values in this interval not displayed.    CBC: Recent Labs  Lab 01/20/18 0850  02/21/2018 1245  02/03/2018 1455 02/21/2018 1923 02/15/2018 2034 01/23/18 0420 01/23/18 1521 01/23/18 1525  WBC 12.0*  --   --   --  20.7* 15.1*  --  18.9*  --  17.8*  HGB 11.7*   < > 8.5*  8.0*   < > 8.3* 8.0* 9.2* 7.8* 7.8* 7.3*  HCT 36.4*   < > 25.0*  24.2*   < > 25.9* 24.2* 27.0* 24.3* 23.0* 23.1*  MCV 88.8  --   --   --  89.6 90.3  --  90.0  --  91.7  PLT 300  --  266  --  234 222  --  243  --  209   < > = values in this interval not displayed.    Coagulation Studies: Recent Labs    01/23/2018 1455  LABPROT 16.4*  INR 1.33    Imaging: Ct Angio Head W Or Wo Contrast  Result Date: 01/23/2018 CLINICAL  DATA:  Initial evaluation for lethargy, difficulty to wake up. Status post CABG. EXAM: CT ANGIOGRAPHY HEAD AND NECK TECHNIQUE: Multidetector CT imaging of the head and neck was performed using the standard protocol during bolus administration of intravenous contrast. Multiplanar CT image reconstructions and MIPs were obtained to evaluate the vascular anatomy. Carotid stenosis measurements (when applicable) are obtained utilizing NASCET criteria, using the distal internal carotid diameter as the denominator. CONTRAST:  50mL ISOVUE-370 IOPAMIDOL (ISOVUE-370) INJECTION 76% COMPARISON:  Prior head CT from 01/02/2017. FINDINGS: CT HEAD FINDINGS Brain: Cerebral volume within normal limits for age. Scattered patchy hypodensity within the subcortical white matter of the right cerebral hemisphere most likely related to remote right MCA territory infarct. No acute intracranial hemorrhage. Parenchymal hypodensity within the inferior left cerebellar hemisphere consistent with acute ischemic infarct, left PICA territory. No other acute large vessel territory infarct. No mass lesion, midline shift or mass effect. No hydrocephalus. No extra-axial fluid collection. Vascular: No hyperdense vessel. Skull: Scalp soft tissues and calvarium within normal limits. Sinuses: Paranasal sinuses are clear. No mastoid effusion. Orbits: Globes and oval soft tissues within normal limits. Review of the MIP images confirms the above findings CTA NECK FINDINGS Aortic arch: Visualized aortic arch of normal caliber with normal branch pattern. Sequelae of recent CABG noted. No flow-limiting stenosis about the origin of the great vessels. Visualized subclavian arteries widely patent. Right carotid system: Right common carotid artery widely patent proximally. There is moderate atheromatous narrowing of approximately 50% at the distal right common carotid artery (series 12, image 228). A centric max plaque about the proximal right ICA with associated mild  narrowing of approximately 35% by NASCET criteria. Right ICA widely patent distally to the skull base without stenosis, dissection, or occlusion. Left carotid system: Left common carotid artery patent from its origin to the bifurcation without stenosis. Mild calcified atheromatous plaque about the left bifurcation without flow-limiting stenosis. Left ICA patent distally to the skull base without flow-limiting stenosis. Vertebral arteries: Both of the vertebral arteries arise from the subclavian arteries. Calcified plaque at  the origin of the vertebral arteries bilaterally with approximately 70% stenosis on the right and 50% stenosis on the left. Multifocal atheromatous irregularity seen throughout the vertebral arteries within the neck but remain patent to the skull base. Skeleton: No acute osseous abnormality. No worrisome lytic or blastic osseous lesions. Recent sternotomy noted. Other neck: No acute soft tissue abnormality within the neck. Right IJ approach central venous catheter. Upper chest: Sequelae of recent CABG. Layering bilateral pleural effusions with associated atelectatic changes noted. Partially visualized lungs are otherwise clear. Review of the MIP images confirms the above findings CTA HEAD FINDINGS Anterior circulation: Petrous segments patent bilaterally. There is extensive calcified atheromatous irregularity throughout the cavernous ICAs bilaterally, slightly progressive as compared to previous. Diameter at the proximal cavernous right ICA measures approximately 2 mm, similar to previous. Focal stenosis at the proximal cavernous left ICA slightly worsened now measuring approximately 2 mm as well. Distal cavernous left ICA also measures approximately 2 mm, similar to previous. ICAs are patent to the termini. A1 segments irregular but patent. Patent anterior communicating artery. Anterior cerebral arteries patent to their distal aspects. Approximate moderate 50% stenosis at the proximal left M1  segment. Left M1 patent distally. Normal left MCA bifurcation. Distal left MCA branches perfuse. Distal small vessel atheromatous irregularity present throughout the MCA branches bilaterally. There is a focal severe near occlusive stenosis at the proximal right M1 segment, worsened from previous. While subocclusive thrombus would be difficult to exclude, this is less favored (series 12, image 99). Right M1 segment patent distally. No proximal right M2 occlusion. Distal right MCA branches well perfused. Posterior circulation: Moderate stenosis of the left vertebral artery as it crosses into the cranial vault. There is additional severe stenosis of the left V4 segment distally beyond the takeoff of the PICA. Left PICA is patent proximally. Approximate 50% stenosis within the right V4 segment prior to the vertebrobasilar junction. Right PICA patent as well. Basilar artery demonstrates multifocal atheromatous irregularity but is patent to its distal aspect. Superior cerebral arteries patent bilaterally. Both of the posterior cerebral arteries primarily supplied via the basilar. PCAs irregular with multifocal moderate to severe stenoses but are patent to their distal aspects. Venous sinuses: Patent. Anatomic variants: None significant.  No aneurysm. Delayed phase: No abnormal enhancement. Review of the MIP images confirms the above findings IMPRESSION: 1. Negative CTA for large vessel occlusion. 2. 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 Left PICA is patent proximally. 3. 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 While subocclusive thrombus would be difficult to exclude, this is felt to be less likely. Query an episode of recent hypotension, which superimposed on the underlying stenosis could result in patient's left-sided symptoms. 4. Advanced atherosclerotic change affecting the cavernous ICAs bilaterally, left greater than right, slightly  progressed from previous. 5. Short-segment 50% proximal left M1 stenosis. 6. Additional advanced for age diffuse atherosclerotic change throughout the extracranial intracranial circulation as above. Critical Value/emergent results were called by telephone at the time of interpretation on 01/23/2018 at 3:51 pm to Dr. Arther DamesSUSHANTH Ertha Nabor, who verbally acknowledged these results. Electronically Signed   By: Rise MuBenjamin  McClintock M.D.   On: 01/23/2018 16:19   Ct Angio Neck W Or Wo Contrast  Result Date: 01/23/2018 CLINICAL DATA:  Initial evaluation for lethargy, difficulty to wake up. Status post CABG. EXAM: CT ANGIOGRAPHY HEAD AND NECK TECHNIQUE: Multidetector CT imaging of the head and neck was performed using the standard protocol during bolus administration  of intravenous contrast. Multiplanar CT image reconstructions and MIPs were obtained to evaluate the vascular anatomy. Carotid stenosis measurements (when applicable) are obtained utilizing NASCET criteria, using the distal internal carotid diameter as the denominator. CONTRAST:  50mL ISOVUE-370 IOPAMIDOL (ISOVUE-370) INJECTION 76% COMPARISON:  Prior head CT from 01/02/2017. FINDINGS: CT HEAD FINDINGS Brain: Cerebral volume within normal limits for age. Scattered patchy hypodensity within the subcortical white matter of the right cerebral hemisphere most likely related to remote right MCA territory infarct. No acute intracranial hemorrhage. Parenchymal hypodensity within the inferior left cerebellar hemisphere consistent with acute ischemic infarct, left PICA territory. No other acute large vessel territory infarct. No mass lesion, midline shift or mass effect. No hydrocephalus. No extra-axial fluid collection. Vascular: No hyperdense vessel. Skull: Scalp soft tissues and calvarium within normal limits. Sinuses: Paranasal sinuses are clear. No mastoid effusion. Orbits: Globes and oval soft tissues within normal limits. Review of the MIP images confirms the above  findings CTA NECK FINDINGS Aortic arch: Visualized aortic arch of normal caliber with normal branch pattern. Sequelae of recent CABG noted. No flow-limiting stenosis about the origin of the great vessels. Visualized subclavian arteries widely patent. Right carotid system: Right common carotid artery widely patent proximally. There is moderate atheromatous narrowing of approximately 50% at the distal right common carotid artery (series 12, image 228). A centric max plaque about the proximal right ICA with associated mild narrowing of approximately 35% by NASCET criteria. Right ICA widely patent distally to the skull base without stenosis, dissection, or occlusion. Left carotid system: Left common carotid artery patent from its origin to the bifurcation without stenosis. Mild calcified atheromatous plaque about the left bifurcation without flow-limiting stenosis. Left ICA patent distally to the skull base without flow-limiting stenosis. Vertebral arteries: Both of the vertebral arteries arise from the subclavian arteries. Calcified plaque at the origin of the vertebral arteries bilaterally with approximately 70% stenosis on the right and 50% stenosis on the left. Multifocal atheromatous irregularity seen throughout the vertebral arteries within the neck but remain patent to the skull base. Skeleton: No acute osseous abnormality. No worrisome lytic or blastic osseous lesions. Recent sternotomy noted. Other neck: No acute soft tissue abnormality within the neck. Right IJ approach central venous catheter. Upper chest: Sequelae of recent CABG. Layering bilateral pleural effusions with associated atelectatic changes noted. Partially visualized lungs are otherwise clear. Review of the MIP images confirms the above findings CTA HEAD FINDINGS Anterior circulation: Petrous segments patent bilaterally. There is extensive calcified atheromatous irregularity throughout the cavernous ICAs bilaterally, slightly progressive as  compared to previous. Diameter at the proximal cavernous right ICA measures approximately 2 mm, similar to previous. Focal stenosis at the proximal cavernous left ICA slightly worsened now measuring approximately 2 mm as well. Distal cavernous left ICA also measures approximately 2 mm, similar to previous. ICAs are patent to the termini. A1 segments irregular but patent. Patent anterior communicating artery. Anterior cerebral arteries patent to their distal aspects. Approximate moderate 50% stenosis at the proximal left M1 segment. Left M1 patent distally. Normal left MCA bifurcation. Distal left MCA branches perfuse. Distal small vessel atheromatous irregularity present throughout the MCA branches bilaterally. There is a focal severe near occlusive stenosis at the proximal right M1 segment, worsened from previous. While subocclusive thrombus would be difficult to exclude, this is less favored (series 12, image 99). Right M1 segment patent distally. No proximal right M2 occlusion. Distal right MCA branches well perfused. Posterior circulation: Moderate stenosis of the left vertebral artery as  it crosses into the cranial vault. There is additional severe stenosis of the left V4 segment distally beyond the takeoff of the PICA. Left PICA is patent proximally. Approximate 50% stenosis within the right V4 segment prior to the vertebrobasilar junction. Right PICA patent as well. Basilar artery demonstrates multifocal atheromatous irregularity but is patent to its distal aspect. Superior cerebral arteries patent bilaterally. Both of the posterior cerebral arteries primarily supplied via the basilar. PCAs irregular with multifocal moderate to severe stenoses but are patent to their distal aspects. Venous sinuses: Patent. Anatomic variants: None significant.  No aneurysm. Delayed phase: No abnormal enhancement. Review of the MIP images confirms the above findings IMPRESSION: 1. Negative CTA for large vessel occlusion. 2.  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 Left PICA is patent proximally. 3. 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 While subocclusive thrombus would be difficult to exclude, this is felt to be less likely. Query an episode of recent hypotension, which superimposed on the underlying stenosis could result in patient's left-sided symptoms. 4. Advanced atherosclerotic change affecting the cavernous ICAs bilaterally, left greater than right, slightly progressed from previous. 5. Short-segment 50% proximal left M1 stenosis. 6. Additional advanced for age diffuse atherosclerotic change throughout the extracranial intracranial circulation as above. Critical Value/emergent results were called by telephone at the time of interpretation on 01/23/2018 at 3:51 pm to Dr. Arther Dames, who verbally acknowledged these results. Electronically Signed   By: Rise Mu M.D.   On: 01/23/2018 16:19   Dg Chest Port 1 View  Result Date: 01/23/2018 CLINICAL DATA:  47 year old male status status post CABG postop day 1. EXAM: PORTABLE CHEST 1 VIEW COMPARISON:  2018-02-19 and earlier. FINDINGS: Portable AP semi upright view at 0529 hours. Extubated. Enteric tube removed. right IJ Swan-Ganz catheter re-demonstrated, tip now at the right main pulmonary artery level. Stable left chest tube and mediastinal tube. Mildly lower lung volumes. Obscuration of the left hemidiaphragm. No pneumothorax. Probable small left pleural effusion. No pulmonary edema. Stable cardiac size and mediastinal contours. Negative visible bowel gas pattern. IMPRESSION: 1. Extubated and enteric tube removed. Otherwise stable lines and tubes. 2. Mildly lower lung volumes with increased left lung base atelectasis. Probable small left pleural effusion. 3. No pneumothorax or pulmonary edema. Electronically Signed   By: Odessa Fleming M.D.   On: 01/23/2018 07:23   Dg Chest Port 1 View  Result  Date: 2018-02-19 CLINICAL DATA:  Status post CABG. EXAM: PORTABLE CHEST 1 VIEW COMPARISON:  01/20/2018 FINDINGS: The endotracheal tube tip is above the carina. A enteric tube is noted with tip below the field of view. Swan-Ganz catheter tip is in the right pulmonary artery. Mediastinal drain and left chest tube noted. No pneumothorax visualized. Atelectasis within the left midlung adjacent to chest tube noted. IMPRESSION: 1. Support apparatus positioned as above. 2. Left midlung atelectasis. 3. Left chest tube without pneumothorax. Electronically Signed   By: Signa Kell M.D.   On: 02/19/2018 15:10   [place] Ekg Site Rite  Result Date: 01/23/2018 If Site Rite image not attached, placement could not be confirmed due to current cardiac rhythm.    ASSESSMENT AND PLAN  47 y.o. male with PMH of CAD, Diabetes Mellitus, HTN, Morbid obesity, OSA s/p CABG yesterday Consulted for left-sided weakness. Last normal prior to surgery. Not a candidate for TPA as he is outside the window. CT head shows new left cerebellar infarct, however patient also not moving his left side  and appears to have neglect concerning for right MCA stroke. CT angiogram showed stenotic right M1, with possible subocclusive thrombus. CT perfusion was obtained (after delay due to inability to get IV access line requiring PICC line). Perfusion scan did not show a large penumbra in the right MCA territory.   Acute ischemic Stroke Left cerebellar infarct, possible watershed infarcts in R MCA territory  Acute Ischemic Stroke   Risk factors: CAD, Diabetes Mellitus, HTN, Morbid obesity, OSA , Intracranial atherosclerotic disease Etiology: ICAD vs cardiembolic  Recommend # MRI of the brain without contrast- when pacer wires are removed #Transthoracic Echo  # ASA if ok by CT surgery team #Start or continue Atorvastatin 80 mg/other high intensity statin # BP goal: permissive HTN upto 220/110  # HBAIC and Lipid profile # Telemetry  monitoring # Frequent neuro checks # NPO until passes stroke swallow screen    Please page stroke NP  Or  PA  Or MD from 8am -4 pm  as this patient from this time will be  followed by the stroke.   You can look them up on [email or web address]  Password [facility]    Ardyce Heyer [facility] Pager Number [phone]

## 2018-01-23 NOTE — Plan of Care (Signed)
1430 patient to CT via bed, monitor, O2 1545 back from CT same fashion

## 2018-01-23 NOTE — Progress Notes (Signed)
CT perfusion completed: No evidence for acute ischemia by CT perfusion.  Continue with current management plan.  Electronically signed: Dr. Caryl PinaEric Jasalyn Frysinger

## 2018-01-23 NOTE — Progress Notes (Signed)
1 Day Post-Op Procedure(s) (LRB): CORONARY ARTERY BYPASS GRAFTING (CABG) TIMES Four,  USING LEFT INTERNAL MAMMARY ARTERY, RIGHT RADIAL ARTERY AND RIGHT SAPHENOUS LEG VEIN HARVESTED ENDOSCOPICALLY (N/A) RADIAL ARTERY HARVEST (Right) TRANSESOPHAGEAL ECHOCARDIOGRAM (TEE) (N/A) Subjective: lethargic  Objective: Vital signs in last 24 hours: Temp:  [97.2 F (36.2 C)-99.1 F (37.3 C)] 97.9 F (36.6 C) (03/02 0845) Pulse Rate:  [79-103] 81 (03/02 0845) Resp:  [9-26] 21 (03/02 0845) BP: (101-138)/(58-81) 114/67 (03/02 0800) SpO2:  [98 %-100 %] 100 % (03/02 0845) FiO2 (%):  [40 %-50 %] 40 % (03/01 1913) Weight:  [337 lb 11.9 oz (153.2 kg)] 337 lb 11.9 oz (153.2 kg) (03/02 0500)  Hemodynamic parameters for last 24 hours: PAP: (28-52)/(9-24) 33/13 CO:  [4 L/min-7.1 L/min] 6.4 L/min CI:  [1.5 L/min/m2-2.6 L/min/m2] 2.6 L/min/m2  Intake/Output from previous day: 03/01 0701 - 03/02 0700 In: 5690.4 [I.V.:3940.4; Blood:150; IV Piggyback:1600] Out: 0454 [[id number] [Urine:1863; Blood:300; Chest Tube:990] Intake/Output this shift: Total I/O In: 58 [I.V.:58] Out: 45 [Urine:45]  General appearance: fatigued Neurologic: lethargic but does follow commands Heart: regular rate and rhythm Lungs: diminished breath sounds bibasilar Abdomen: normal findings: soft, non-tender  Lab Results: Recent Labs    03-14-2018 1923 03-14-2018 2034 01/23/18 0420  WBC 15.1*  --  18.9*  HGB 8.0* 9.2* 7.8*  HCT 24.2* 27.0* 24.3*  PLT 222  --  243   BMET:  Recent Labs    01/20/18 0850  03-14-2018 2034 01/23/18 0420  NA 130*   < > 138 136  K 4.3   < > 4.5 4.9  CL 97*   < > 103 105  CO2 21*  --   --  21*  GLUCOSE 149*   < > 125* 118*  BUN 20   < > 21* 22*  CREATININE 1.28*   < > 1.20 1.23  CALCIUM 8.9  --   --  7.8*   < > = values in this interval not displayed.    PT/INR:  Recent Labs    03-14-2018 1455  LABPROT 16.4*  INR 1.33   ABG    Component Value Date/Time   PHART 7.340 (L) 01/23/2018 0846   HCO3  22.2 01/23/2018 0846   TCO2 23 01/23/2018 0846   ACIDBASEDEF 3.0 (H) 01/23/2018 0846   O2SAT 99.0 01/23/2018 0846   CBG (last 3)  Recent Labs    01/23/18 0606 01/23/18 0652 01/23/18 0731  GLUCAP 114* 114* 129*    Assessment/Plan: S/P Procedure(s) (LRB): CORONARY ARTERY BYPASS GRAFTING (CABG) TIMES Four,  USING LEFT INTERNAL MAMMARY ARTERY, RIGHT RADIAL ARTERY AND RIGHT SAPHENOUS LEG VEIN HARVESTED ENDOSCOPICALLY (N/A) RADIAL ARTERY HARVEST (Right) TRANSESOPHAGEAL ECHOCARDIOGRAM (TEE) (N/A) POD # 1 CABG  CV- hemodynamics stable- dc swan  ASA, statin, beta blocker  RESP- Poor effort with IS- some LLL atelectasis on CXR  RENAL- creatinine and lytes OK, diurese  ENDO- CBG well controlled with insulin drip - transition to Levemir + SSI  Anemia- secondary to ABL- mild, follow  NEURO- primary concern- he is lethargic this AM, does follow simple commands. Follow exam. Do not see any benefit to head CT at this point, but may need later if no improvement  DC CT   LOS: 1 day    Loreli SlotSteven C Hendrickson 01/23/2018

## 2018-01-23 NOTE — Progress Notes (Signed)
[address]       [place] [id number]             [phone]      Getting PICC line placed currently  BP (!) 107/55   Pulse 87   Temp 98.6 F (37 C) (Oral)   Resp 17   Ht 6\' 2"  (1.88 m)   Wt (!) 337 lb 11.9 oz (153.2 kg)   SpO2 97%   BMI 43.36 kg/m   Intake/Output Summary (Last 24 hours) at 01/23/2018 1755 Last data filed at 01/23/2018 1700 Gross per 24 hour  Intake 2144.39 ml  Output 2440 ml  Net -295.61 ml   CT ANGIOGRAPHY HEAD AND NECK  TECHNIQUE: Multidetector CT imaging of the head and neck was performed using the standard protocol during bolus administration of intravenous contrast. Multiplanar CT image reconstructions and MIPs were obtained to evaluate the vascular anatomy. Carotid stenosis measurements (when applicable) are obtained utilizing NASCET criteria, using the distal internal carotid diameter as the denominator.  CONTRAST:  50mL ISOVUE-370 IOPAMIDOL (ISOVUE-370) INJECTION 76%  COMPARISON:  Prior head CT from 01/02/2017.  FINDINGS: CT HEAD FINDINGS  Brain: Cerebral volume within normal limits for age. Scattered patchy hypodensity within the subcortical white matter of the right cerebral hemisphere most likely related to remote right MCA territory infarct.  No acute intracranial hemorrhage. Parenchymal hypodensity within the inferior left cerebellar hemisphere consistent with acute ischemic infarct, left PICA territory. No other acute large vessel territory infarct. No mass lesion, midline shift or mass effect. No hydrocephalus. No extra-axial fluid collection.  Vascular: No hyperdense vessel.  Skull: Scalp soft tissues and calvarium within normal limits.  Sinuses: Paranasal sinuses are clear. No mastoid effusion.  Orbits: Globes and oval soft tissues within normal limits.  Review of the MIP images confirms the above findings  CTA NECK FINDINGS  Aortic arch: Visualized aortic arch of normal caliber  with normal branch pattern. Sequelae of recent CABG noted. No flow-limiting stenosis about the origin of the great vessels. Visualized subclavian arteries widely patent.  Right carotid system: Right common carotid artery widely patent proximally. There is moderate atheromatous narrowing of approximately 50% at the distal right common carotid artery (series 12, image 228). A centric max plaque about the proximal right ICA with associated mild narrowing of approximately 35% by NASCET criteria. Right ICA widely patent distally to the skull base without stenosis, dissection, or occlusion.  Left carotid system: Left common carotid artery patent from its origin to the bifurcation without stenosis. Mild calcified atheromatous plaque about the left bifurcation without flow-limiting stenosis. Left ICA patent distally to the skull base without flow-limiting stenosis.  Vertebral arteries: Both of the vertebral arteries arise from the subclavian arteries. Calcified plaque at the origin of the vertebral arteries bilaterally with approximately 70% stenosis on the right and 50% stenosis on the left. Multifocal atheromatous irregularity seen throughout the vertebral arteries within the neck but remain patent to the skull base.  Skeleton: No acute osseous abnormality. No worrisome lytic or blastic osseous lesions. Recent sternotomy noted.  Other neck: No acute soft tissue abnormality within the neck. Right IJ approach central venous catheter.  Upper chest: Sequelae of recent CABG. Layering bilateral pleural effusions with associated atelectatic changes noted. Partially visualized lungs are otherwise clear.  Review of the MIP images confirms the above findings  CTA HEAD FINDINGS  Anterior circulation: Petrous segments patent bilaterally. There is extensive calcified atheromatous irregularity throughout the cavernous ICAs bilaterally, slightly  progressive as compared to previous.  Diameter at the proximal cavernous right ICA measures approximately 2 mm, similar to previous. Focal stenosis at the proximal cavernous left ICA slightly worsened now measuring approximately 2 mm as well. Distal cavernous left ICA also measures approximately 2 mm, similar to previous. ICAs are patent to the termini.  A1 segments irregular but patent. Patent anterior communicating artery. Anterior cerebral arteries patent to their distal aspects.  Approximate moderate 50% stenosis at the proximal left M1 segment. Left M1 patent distally. Normal left MCA bifurcation. Distal left MCA branches perfuse.  Distal small vessel atheromatous irregularity present throughout the MCA branches bilaterally.  There is a focal severe near occlusive stenosis at the proximal right M1 segment, worsened from previous. While subocclusive thrombus would be difficult to exclude, this is less favored (series 12, image 99). Right M1 segment patent distally. No proximal right M2 occlusion. Distal right MCA branches well perfused.  Posterior circulation: Moderate stenosis of the left vertebral artery as it crosses into the cranial vault. There is additional severe stenosis of the left V4 segment distally beyond the takeoff of the PICA. Left PICA is patent proximally. Approximate 50% stenosis within the right V4 segment prior to the vertebrobasilar junction. Right PICA patent as well. Basilar artery demonstrates multifocal atheromatous irregularity but is patent to its distal aspect. Superior cerebral arteries patent bilaterally. Both of the posterior cerebral arteries primarily supplied via the basilar. PCAs irregular with multifocal moderate to severe stenoses but are patent to their distal aspects.  Venous sinuses: Patent.  Anatomic variants: None significant.  No aneurysm.  Delayed phase: No abnormal enhancement.  Review of the MIP images confirms the above findings  IMPRESSION: 1.  Negative CTA for large vessel occlusion. 2. 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 Left PICA is patent proximally. 3. 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 While subocclusive thrombus would be difficult to exclude, this is felt to be less likely. Query an episode of recent hypotension, which superimposed on the underlying stenosis could result in patient's left-sided symptoms. 4. Advanced atherosclerotic change affecting the cavernous ICAs bilaterally, left greater than right, slightly progressed from previous. 5. Short-segment 50% proximal left M1 stenosis. 6. Additional advanced for age diffuse atherosclerotic change throughout the extracranial intracranial circulation as above.  Critical Value/emergent results were called by telephone at the time of interpretation on 01/23/2018 at 3:51 pm to Dr. Arther Dames, who verbally acknowledged these results.   Electronically Signed   By: Rise Mu M.D.   On: 01/23/2018 16:19  D/w Dr. Laurence Slate. Plan is perfusion scan. If large area at risk, IR will attempt stent  Viviann Spare C. Dorris Fetch, MD [facility] [phone]

## 2018-01-23 NOTE — Progress Notes (Signed)
Dr. Dorris FetchHendrickson paged about pt's hypertension SBP 160-190's. Verbal order received for IV Hydralazine 10 mg for SBP > 170. Also updated MD about pt's CT perfusion results: no evidence for acute ischemia. Will implement orders and continue to monitor pt closely.

## 2018-01-23 NOTE — Plan of Care (Signed)
1900 to CT via bed, monitor and O2

## 2018-01-24 ENCOUNTER — Inpatient Hospital Stay (HOSPITAL_COMMUNITY): Payer: BLUE CROSS/BLUE SHIELD

## 2018-01-24 ENCOUNTER — Other Ambulatory Visit (HOSPITAL_COMMUNITY): Payer: BLUE CROSS/BLUE SHIELD

## 2018-01-24 DIAGNOSIS — R4182 Altered mental status, unspecified: Secondary | ICD-10-CM

## 2018-01-24 DIAGNOSIS — I6330 Cerebral infarction due to thrombosis of unspecified cerebral artery: Secondary | ICD-10-CM

## 2018-01-24 LAB — GLUCOSE, CAPILLARY
GLUCOSE-CAPILLARY: 181 mg/dL — AB (ref 65–99)
GLUCOSE-CAPILLARY: 181 mg/dL — AB (ref 65–99)
GLUCOSE-CAPILLARY: 209 mg/dL — AB (ref 65–99)
Glucose-Capillary: 156 mg/dL — ABNORMAL HIGH (ref 65–99)
Glucose-Capillary: 158 mg/dL — ABNORMAL HIGH (ref 65–99)
Glucose-Capillary: 183 mg/dL — ABNORMAL HIGH (ref 65–99)

## 2018-01-24 LAB — POCT I-STAT 4, (NA,K, GLUC, HGB,HCT)
Glucose, Bld: 195 mg/dL — ABNORMAL HIGH (ref 65–99)
HCT: 21 % — ABNORMAL LOW (ref 39.0–52.0)
HEMOGLOBIN: 7.1 g/dL — AB (ref 13.0–17.0)
POTASSIUM: 4.3 mmol/L (ref 3.5–5.1)
SODIUM: 141 mmol/L (ref 135–145)

## 2018-01-24 LAB — BASIC METABOLIC PANEL
Anion gap: 9 (ref 5–15)
BUN: 26 mg/dL — AB (ref 6–20)
CALCIUM: 7.8 mg/dL — AB (ref 8.9–10.3)
CO2: 22 mmol/L (ref 22–32)
CREATININE: 1.35 mg/dL — AB (ref 0.61–1.24)
Chloride: 105 mmol/L (ref 101–111)
GFR calc Af Amer: >60 mL/min (ref >60)
GLUCOSE: 182 mg/dL — AB (ref 65–99)
POTASSIUM: 4.3 mmol/L (ref 3.5–5.1)
SODIUM: 136 mmol/L (ref 135–145)

## 2018-01-24 LAB — CBC
HEMATOCRIT: 21 % — AB (ref 39.0–52.0)
Hemoglobin: 6.9 g/dL — CL (ref 13.0–17.0)
MCH: 30 pg (ref 26.0–34.0)
MCHC: 32.9 g/dL (ref 30.0–36.0)
MCV: 91.3 fL (ref 78.0–100.0)
PLATELETS: 176 10*3/uL (ref 150–400)
RBC: 2.3 MIL/uL — ABNORMAL LOW (ref 4.22–5.81)
RDW: 16 % — AB (ref 11.5–15.5)
WBC: 18.3 10*3/uL — AB (ref 4.0–10.5)

## 2018-01-24 LAB — LIPID PANEL
Cholesterol: 83 mg/dL (ref 0–200)
HDL: 31 mg/dL — AB (ref >40)
LDL CALC: 36 mg/dL (ref 0–99)
TRIGLYCERIDES: 80 mg/dL (ref <150)
Total CHOL/HDL Ratio: 2.7 RATIO
VLDL: 16 mg/dL (ref 0–40)

## 2018-01-24 LAB — HEMOGLOBIN A1C
Hgb A1c MFr Bld: 7.2 % — ABNORMAL HIGH (ref 4.8–5.6)
Mean Plasma Glucose: 159.94 mg/dL

## 2018-01-24 LAB — PREPARE RBC (CROSSMATCH)

## 2018-01-24 MED ORDER — ISOSORB DINITRATE-HYDRALAZINE 20-37.5 MG PO TABS
1.0000 | ORAL_TABLET | Freq: Three times a day (TID) | ORAL | Status: DC
  Administered 2018-01-25 – 2018-02-02 (×21): 1 via ORAL
  Filled 2018-01-24 (×47): qty 1

## 2018-01-24 MED ORDER — FUROSEMIDE 10 MG/ML IJ SOLN
40.0000 mg | Freq: Once | INTRAMUSCULAR | Status: AC
  Administered 2018-01-24: 40 mg via INTRAVENOUS
  Filled 2018-01-24: qty 4

## 2018-01-24 MED ORDER — ORAL CARE MOUTH RINSE
15.0000 mL | Freq: Two times a day (BID) | OROMUCOSAL | Status: DC
  Administered 2018-01-24 – 2018-01-31 (×14): 15 mL via OROMUCOSAL

## 2018-01-24 MED ORDER — SODIUM CHLORIDE 0.9 % IV SOLN: Freq: Once | INTRAVENOUS | Status: DC

## 2018-01-24 MED ORDER — INSULIN DETEMIR 100 UNIT/ML ~~LOC~~ SOLN
30.0000 [IU] | Freq: Two times a day (BID) | SUBCUTANEOUS | Status: DC
  Administered 2018-01-24 – 2018-01-27 (×8): 30 [IU] via SUBCUTANEOUS
  Filled 2018-01-24 (×9): qty 0.3

## 2018-01-24 MED ORDER — ASPIRIN 300 MG RE SUPP
300.0000 mg | Freq: Every day | RECTAL | Status: DC
  Administered 2018-01-24 – 2018-01-25 (×2): 300 mg via RECTAL
  Filled 2018-01-24 (×3): qty 1

## 2018-01-24 MED ORDER — MONTELUKAST SODIUM 10 MG PO TABS: 10.0000 mg | ORAL_TABLET | Freq: Every evening | ORAL | Status: DC | PRN

## 2018-01-24 MED ORDER — CLOPIDOGREL BISULFATE 75 MG PO TABS
75.0000 mg | ORAL_TABLET | Freq: Every day | ORAL | Status: DC
  Administered 2018-01-26 – 2018-02-13 (×19): 75 mg via ORAL
  Filled 2018-01-24 (×20): qty 1

## 2018-01-24 NOTE — Procedures (Signed)
Date of recording  01/24/2018  Referring physician Aroor, Dara LordsSushanth R, MD  Reason for the study Altered mental status, stroke  Technical Digital EEG recording using 10-20 international electrode system  Description of the recording Predominant rhythm is 7-8 Hz on the left, on the right varies between 4-6, reactive. Almost continuous delta slowing in the right frontotemporal parietal region. Epileptiform features were not seen during this recording.   Impression The EEG is abnormal and findings are suggestive of  mild generalized cerebral dysfunction.  Focal left hemispheric slowing suggesting underlying structural defect. Epileptiform features were not seen during this recording.

## 2018-01-24 NOTE — Evaluation (Signed)
Clinical/Bedside Swallow Evaluation Patient Details  Name: Nicholas Mejia MRN: [id number] Date of Birth: Jul 24, 1971  Today's Date: 01/24/2018 Time: SLP Start Time (ACUTE ONLY): 1203 SLP Stop Time (ACUTE ONLY): 1215 SLP Time Calculation (min) (ACUTE ONLY): 12 min  Past Medical History:  Past Medical History:  Diagnosis Date  . CAD (coronary artery disease)   . Complication of anesthesia   . Diabetes mellitus without complication (HCC)   . Dyspnea    on exertion  . GERD (gastroesophageal reflux disease)   . Hypertension   . Ischemic cardiomyopathy   . Morbid obesity (HCC)   . Pneumonia   . Sleep apnea    pt. has not received his cpap yet  . Stroke [facility](HCC) 12/30/2016   Past Surgical History:  Past Surgical History:  Procedure Laterality Date  . EXTERNAL EAR SURGERY  1995   right ear -removed keloid  . LEFT HEART CATH AND CORONARY ANGIOGRAPHY N/A 12/22/2017   Procedure: LEFT HEART CATH AND CORONARY ANGIOGRAPHY;  Surgeon: Elder NegusPatwardhan, Manish J, MD;  Location: [facility] INVASIVE CV LAB;  Service: Cardiovascular;  Laterality: N/A;   HPI:  Nicholas JohnWarren Cookis an 47 y.o.malewith PMH of CAD, Diabetes Mellitus, HTN, Morbid obesity, OSA. Admitted for CABG. Patient underwent CABG procedure on 3.1.19 and has remained lethargic since then. Pt noted to not have been moving left side and Neurology was consulted for concern of stroke. CT head showed new left PICA infarct, CTA showed severe R m1 stenosis. CT perfusion was done due to concern for LVO which was negative for large infarct/penumbra in R MCA territory. Intubated for procedure only.   Assessment / Plan / Recommendation Clinical Impression  Patient presents with left sided CN VII impairments and suspected CN XII impairments. He is awake but drowsy, following basic commands with 75% accuracy. Decreased sustained attention and right gaze preference noted. With ice chip, pt with L anterior loss due to decreased labial seal. He does manipulate orally, however  presents with immediate coughing, suggestive of reduced airway protection. Pt not yet ready for instrumental assessment given lethargy. Spoke with family members and pt's fiance re: plan, will likely need instrumental testing. Will follow up next date to determine readiness for FEES. Recommend he remain NPO for now, meds via alternative means due to moderate aspiration risk.    SLP Visit Diagnosis: Dysphagia, oropharyngeal phase (R13.12)    Aspiration Risk  Moderate aspiration risk    Diet Recommendation NPO   Medication Administration: Via alternative means    Other  Recommendations Oral Care Recommendations: Oral care QID   Follow up Recommendations Inpatient Rehab      Frequency and Duration min 2x/week  2 weeks       Prognosis Prognosis for Safe Diet Advancement: Good Barriers to Reach Goals: Cognitive deficits      Swallow Study   General Date of Onset: Apr 15, 2018 HPI: Nicholas JohnWarren Cookis an 47 y.o.malewith PMH of CAD, Diabetes Mellitus, HTN, Morbid obesity, OSA. Admitted for CABG. Patient underwent CABG procedure on 3.1.19 and has remained lethargic since then. Pt noted to not have been moving left side and Neurology was consulted for concern of stroke. CT head showed new left PICA infarct, CTA showed severe R m1 stenosis. CT perfusion was done due to concern for LVO which was negative for large infarct/penumbra in R MCA territory. Intubated for procedure only. Type of Study: Bedside Swallow Evaluation Diet Prior to this Study: NPO Temperature Spikes Noted: No Respiratory Status: Nasal cannula History of Recent Intubation: Yes Length of Intubations (  days): (for procedure) Date extubated: 01/23/18 Behavior/Cognition: Lethargic/Drowsy;Distractible;Requires cueing Oral Cavity Assessment: Within Functional Limits Oral Care Completed by SLP: Yes Oral Cavity - Dentition: Adequate natural dentition Self-Feeding Abilities: Total assist Patient Positioning: Upright in bed Baseline  Vocal Quality: Low vocal intensity Volitional Cough: Cognitively unable to elicit Volitional Swallow: Able to elicit    Oral/Motor/Sensory Function Overall Oral Motor/Sensory Function: Moderate impairment Facial ROM: Reduced left;Suspected CN VII (facial) dysfunction Facial Symmetry: Abnormal symmetry left;Suspected CN VII (facial) dysfunction Facial Strength: Reduced left;Suspected CN VII (facial) dysfunction Lingual ROM: Other (Comment)(Questionable; pt has difficulty following commands) Lingual Symmetry: Abnormal symmetry left;Suspected CN XII (hypoglossal) dysfunction(questionable)   Ice Chips Ice chips: Impaired Oral Phase Impairments: Poor awareness of bolus Oral Phase Functional Implications: Left anterior spillage Pharyngeal Phase Impairments: Cough - Immediate   Thin Liquid Thin Liquid: Not tested    Nectar Thick Nectar Thick Liquid: Not tested   Honey Thick Honey Thick Liquid: Not tested   Puree Puree: Not tested   Solid   GO   Rondel Baton, [place], CCC-SLP Speech-Language Pathologist [phone] Solid: Not tested        Arlana Lindau 01/24/2018,12:21 PM

## 2018-01-24 NOTE — Evaluation (Signed)
Physical Therapy Evaluation Patient Details Name: Nicholas Mejia MRN: [id number] DOB: [birth year] Today's Date: 01/24/2018   History of Present Illness  [name] is a 47 year old man with a past medical history significant for stroke, type 2 diabetes without complication, hypertension, morbid obesity, coronary artery disease, and ischemic cardiomyopathy. Now S/p CABG x4;  Postop Imaging reveals Parenchymal hypodensity within the inferior left cerebellar hemisphere consistent with acute ischemic infarct, left PICA territory, and  Remote R MCA infarct as well.He was recently diagnosed with sleep apnea.  He had a stroke in February 2018 and went to [facility] for rehab  Clinical Impression   Pt admitted with above diagnosis. Pt currently with functional limitations due to the deficits listed below (see PT Problem List). Presents with significantly decr functional mobility, and activity tolerance, with CVA after CABGx4; Able to participate minimally today, tolerated sitting EOB with Max to Total assist for support for appox 5-7 minutes; He will need extensive Post-Acute Rehabilitation -- level of intensity and venue for rehab will depend on progress; Would like CIR to weigh in (he had good outcomes form CIR stay last Feb. 2018); I anticipate they would initially follow at a distance;  Pt will benefit from skilled PT to increase their independence and safety with mobility to allow discharge to the venue listed below.       Follow Up Recommendations Other (comment)(Post-Acute Rehabilitation)    Equipment Recommendations  Other (comment)(to be determined)    Recommendations for Other Services       Precautions / Restrictions Precautions Precautions: Sternal;Fall Precaution Comments: Educated fiance, Nicholas Mejia, of "move in the tube" sternal precautions Restrictions Other Position/Activity Restrictions: Sternal precautions      Mobility  Bed Mobility Overal bed mobility: Needs Assistance Bed Mobility:  Rolling;Sidelying to Sit;Sit to Supine Rolling: Total assist;+2 for physical assistance Sidelying to sit: Total assist;+2 for physical assistance   Sit to supine: Total assist;+2 for physical assistance   General bed mobility comments: Total assist and use of bed pad for all aspects of bed mobility  Transfers                 General transfer comment: Unable today  Ambulation/Gait                Stairs            Wheelchair Mobility    Modified Rankin (Stroke Patients Only) Modified Rankin (Stroke Patients Only) Pre-Morbid Rankin Score: No significant disability Modified Rankin: Severe disability     Balance Overall balance assessment: Needs assistance Sitting-balance support: Bilateral upper extremity supported;Feet supported;Single extremity supported Sitting balance-Leahy Scale: Zero Sitting balance - Comments: Sat EOB approx 5-7 minutes with level of support varying between Max and Total assist; Tends to lean left and posteriorly when backing off of support, and not showing robust righting reactions; Worked on weight shifts in all planes, especailly anteriorly; able to reach with RUE and hod fiance's hand as she sat directly in front of him Postural control: Left lateral lean;Posterior lean                                   Pertinent Vitals/Pain Pain Assessment: Faces Faces Pain Scale: No hurt    Home Living Family/patient expects to be discharged to:: Private residence Living Arrangements: Spouse/significant other Available Help at Discharge: Family;Friend(s);Available PRN/intermittently             Additional Comments: Will need  more information re: home setup and assist; Per notes from last admission, he lives in an apartment with a flight of steps to get in; this will have to be verified    Prior Function           Comments: Need more info re: PLOF; per chart review, Nicholas Mejia had no motor deficits, and some residual  sensory deficits from his stroke in 2/18     Hand Dominance        Extremity/Trunk Assessment   Upper Extremity Assessment Upper Extremity Assessment: Defer to OT evaluation;LUE deficits/detail LUE Deficits / Details: No voluntary movement noted    Lower Extremity Assessment Lower Extremity Assessment: RLE deficits/detail;LLE deficits/detail RLE Deficits / Details: Generalized weakness; able to initiated knee extension/leg lifting against gravity to command LLE Deficits / Details: no volitional movement noted; AROM hip, knee, ankle grossly limited by body habitus       Communication   Communication: Expressive difficulties;Other (comment)(Very sleepy; would wake long enough to answer yes/no questions)  Cognition Arousal/Alertness: Lethargic Behavior During Therapy: WFL for tasks assessed/performed Overall Cognitive Status: Impaired/Different from baseline Area of Impairment: Attention;Following commands                   Current Attention Level: (R gaze preference)   Following Commands: Follows one step commands inconsistently       General Comments: arouses long enough to acknowleadge his name being called; brief small smile of recognition of fiance sitting in front of him; did say Thank you as we ended session; L inattention throughout session      General Comments General comments (skin integrity, edema, etc.): Noted O2 sat drop to 88% on Room Air with good pleth wave after laying back down, restarted supplemental O2 and increased from 1 L to 2 L and O2 sats incr back to greater than or equal to 94%; otherwise, VSS; R gaze preference    Exercises     Assessment/Plan    PT Assessment Patient needs continued PT services  PT Problem List Decreased strength;Decreased range of motion;Decreased activity tolerance;Decreased mobility;Decreased balance;Decreased coordination;Decreased cognition;Decreased knowledge of use of DME;Decreased safety awareness;Decreased  knowledge of precautions;Cardiopulmonary status limiting activity;Pain;Impaired sensation;Obesity       PT Treatment Interventions DME instruction;Gait training;Functional mobility training;Therapeutic activities;Therapeutic exercise;Balance training;Neuromuscular re-education;Cognitive remediation;Patient/family education;Wheelchair mobility training    PT Goals (Current goals can be found in the Care Plan section)  Acute Rehab PT Goals Patient Stated Goal: unable to state PT Goal Formulation: With patient/family Time For Goal Achievement: 02/07/18 Potential to Achieve Goals: Fair    Frequency Min 3X/week   Barriers to discharge        Co-evaluation               AM-PAC PT "6 Clicks" Daily Activity  Outcome Measure Difficulty turning over in bed (including adjusting bedclothes, sheets and blankets)?: Unable Difficulty moving from lying on back to sitting on the side of the bed? : Unable Difficulty sitting down on and standing up from a chair with arms (e.g., wheelchair, bedside commode, etc,.)?: Unable Help needed moving to and from a bed to chair (including a wheelchair)?: Total Help needed walking in hospital room?: Total Help needed climbing 3-5 steps with a railing? : Total 6 Click Score: 6    End of Session Equipment Utilized During Treatment: Oxygen(bed pad) Activity Tolerance: Patient limited by fatigue;Patient limited by lethargy Patient left: in bed;with call bell/phone within reach;with nursing/sitter in room;with family/visitor present Nurse Communication:  Mobility status PT Visit Diagnosis: Hemiplegia and hemiparesis;Other (comment)(Decr functional capacity) Hemiplegia - Right/Left: Left Hemiplegia - dominant/non-dominant: Non-dominant Hemiplegia - caused by: Cerebral infarction    Time: 1610-9604 PT Time Calculation (min) (ACUTE ONLY): 32 min   Charges:   PT Evaluation $PT Eval High Complexity: 1 High PT Treatments $Therapeutic Activity: 8-22  mins   PT G Codes:       Van Clines, PT  Acute Rehabilitation Services Pager [phone] Office [phone]   Levi Aland 01/24/2018, 1:33 PM

## 2018-01-24 NOTE — Progress Notes (Signed)
      [address]       [place] [id number]             [phone]      Sleeping Neuro exam unchanged  BP (!) 129/97   Pulse 100   Temp (!) 100.6 F (38.1 C) (Axillary)   Resp 16   Ht 6\' 2"  (1.88 m)   Wt (!) 335 lb (152 kg)   SpO2 97%   BMI 43.01 kg/m   Intake/Output Summary (Last 24 hours) at 01/24/2018 1638 Last data filed at 01/24/2018 1600 Gross per 24 hour  Intake 615 ml  Output 2360 ml  Net -1745 ml   Diuresing well  Viviann SpareSteven C. Dorris FetchHendrickson, MD [facility] [phone]

## 2018-01-24 NOTE — Progress Notes (Signed)
2 Days Post-Op Procedure(s) (LRB): CORONARY ARTERY BYPASS GRAFTING (CABG) TIMES Four,  USING LEFT INTERNAL MAMMARY ARTERY, RIGHT RADIAL ARTERY AND RIGHT SAPHENOUS LEG VEIN HARVESTED ENDOSCOPICALLY (N/A) RADIAL ARTERY HARVEST (Right) TRANSESOPHAGEAL ECHOCARDIOGRAM (TEE) (N/A) Subjective: Answers simple questions with one word answers Wife noted him moving left arm overnight  Objective: Vital signs in last 24 hours: Temp:  [97.9 F (36.6 C)-100.4 F (38 C)] 100.2 F (37.9 C) (03/03 0745) Pulse Rate:  [82-98] 98 (03/03 0700) Cardiac Rhythm: Normal sinus rhythm;Heart block (03/03 0400) Resp:  [14-27] 26 (03/03 0700) BP: (95-193)/(40-171) 121/90 (03/03 0700) SpO2:  [94 %-100 %] 96 % (03/03 0700) Weight:  [335 lb (152 kg)] 335 lb (152 kg) (03/03 0300)  Hemodynamic parameters for last 24 hours: PAP: (29-35)/(10-14) 29/10  Intake/Output from previous day: 03/02 0701 - 03/03 0700 In: 804.4 [P.O.:240; I.V.:464.4; IV Piggyback:100] Out: 1930 [Urine:1830; Chest Tube:100] Intake/Output this shift: No intake/output data recorded.  General appearance: cooperative and no distress Neurologic: left sided weakness and left side neglect Heart: regular rate and rhythm Lungs: diminished breath sounds bibasilar Abdomen: normal findings: soft, non-tender  Lab Results: Recent Labs    01/23/18 1525 01/24/18 0409  WBC 17.8* 18.3*  HGB 7.3* 6.9*  HCT 23.1* 21.0*  PLT 209 176   BMET:  Recent Labs    01/23/18 0420 01/23/18 1521 01/23/18 1525 01/24/18 0409  NA 136 136  --  136  K 4.9 4.7  --  4.3  CL 105 102  --  105  CO2 21*  --   --  22  GLUCOSE 118* 153*  --  182*  BUN 22* 23*  --  26*  CREATININE 1.23 1.30* 1.34* 1.35*  CALCIUM 7.8*  --   --  7.8*    PT/INR:  Recent Labs    Jul 06, 2018 1455  LABPROT 16.4*  INR 1.33   ABG    Component Value Date/Time   PHART 7.340 (L) 01/23/2018 0846   HCO3 22.2 01/23/2018 0846   TCO2 23 01/23/2018 1521   ACIDBASEDEF 3.0 (H) 01/23/2018  0846   O2SAT 99.0 01/23/2018 0846   CBG (last 3)  Recent Labs    01/23/18 2338 01/24/18 0336 01/24/18 0744  GLUCAP 169* 181* 183*    Assessment/Plan: S/P Procedure(s) (LRB): CORONARY ARTERY BYPASS GRAFTING (CABG) TIMES Four,  USING LEFT INTERNAL MAMMARY ARTERY, RIGHT RADIAL ARTERY AND RIGHT SAPHENOUS LEG VEIN HARVESTED ENDOSCOPICALLY (N/A) RADIAL ARTERY HARVEST (Right) TRANSESOPHAGEAL ECHOCARDIOGRAM (TEE) (N/A) -  NEURO- Right CVA with left hemiparesis  Likely water shed due to severe intracranial atherosclerosis  PT/OT/Speech  Neurology following  CV- in SR  Hypertension- want BP moderately high  Will dc Imdur and restart Bidil  RESP- IS as tolerated  RENAL- creatinine mildly elevated, follow  ENDO- CBG 170-180 range  Increase levemir  SCD + enoxaparin for DVT prophylaxis  Anemia secondary to ABL- transfuse 1 unit PRBC   LOS: 2 days    Loreli SlotSteven C Jionni Helming 01/24/2018

## 2018-01-24 NOTE — CV Procedure (Signed)
Attempted 2D Echo, patient was waiting for nurse, will try again at a later time.  Sinda DuJoseph Totiana Everson

## 2018-01-24 NOTE — Progress Notes (Signed)
STROKE TEAM PROGRESS NOTE   HISTORY OF PRESENT ILLNESS (per record) Nicholas Mejia is an 47 y.o. male with PMH of CAD, Diabetes Mellitus, HTN, Morbid obesity, OSA Admitted for CABG. Patient underwent CABG procedure on 3.1.19 and has remained lethargic since then. Today patient was more alert, however noted to not have been moving left side and Neurology was consulted for concern of stroke.   CT head showed new left PICA infarct, CTA showed severe R m1 stenosis. CT perfusion was done due to concern for LVO which was negative for large infarct/penumbra in R MCA territory.  Date last known well: 07-Feb-2018 Time last known well: morning tPA Given: no, outside window NIHSS:  Baseline MRS     SUBJECTIVE (INTERVAL HISTORY) The patient's fianc was at the bedside. She feels the patient is doing a little better. The patient follows minimal commands but he does withdraw to pain. The nurse reported that the patient occasionally speaks to her. He remains NPO. We will check an EEG.    OBJECTIVE Temp:  [97.9 F (36.6 C)-100.4 F (38 C)] 100.2 F (37.9 C) (03/03 0745) Pulse Rate:  [81-98] 98 (03/03 0700) Cardiac Rhythm: Normal sinus rhythm;Heart block (03/03 0400) Resp:  [14-27] 26 (03/03 0700) BP: (95-193)/(40-171) 121/90 (03/03 0700) SpO2:  [94 %-100 %] 96 % (03/03 0700) Weight:  [335 lb (152 kg)] 335 lb (152 kg) (03/03 0300)  CBC:  Recent Labs  Lab 01/23/18 1525 01/24/18 0409  WBC 17.8* 18.3*  HGB 7.3* 6.9*  HCT 23.1* 21.0*  MCV 91.7 91.3  PLT 209 176    Basic Metabolic Panel:  Recent Labs  Lab 01/23/18 0420 01/23/18 1521 01/23/18 1525 01/24/18 0409  NA 136 136  --  136  K 4.9 4.7  --  4.3  CL 105 102  --  105  CO2 21*  --   --  22  GLUCOSE 118* 153*  --  182*  BUN 22* 23*  --  26*  CREATININE 1.23 1.30* 1.34* 1.35*  CALCIUM 7.8*  --   --  7.8*  MG 2.4  --  2.5*  --     Lipid Panel:     Component Value Date/Time   CHOL 83 01/24/2018 0409   TRIG 80 01/24/2018 0409    HDL 31 (L) 01/24/2018 0409   CHOLHDL 2.7 01/24/2018 0409   VLDL 16 01/24/2018 0409   LDLCALC 36 01/24/2018 0409   HgbA1c:  Lab Results  Component Value Date   HGBA1C 7.2 (H) 01/24/2018   Urine Drug Screen:     Component Value Date/Time   LABOPIA NONE DETECTED 12/30/2016 2146   COCAINSCRNUR NONE DETECTED 12/30/2016 2146   LABBENZ NONE DETECTED 12/30/2016 2146   AMPHETMU NONE DETECTED 12/30/2016 2146   THCU NONE DETECTED 12/30/2016 2146   LABBARB NONE DETECTED 12/30/2016 2146    Alcohol Level No results found for: ETH  IMAGING  Ct Angio Head W Or Wo Contrast Ct Angio Neck W Or Wo Contrast 01/23/2018 IMPRESSION:  1. Negative CTA for large vessel occlusion.  2. 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 Left PICA is patent proximally.  3. 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 While subocclusive thrombus would be difficult to exclude, this is felt to be less likely. Query an episode of recent hypotension, which superimposed on the underlying stenosis could result in patient's left-sided symptoms.  4. Advanced atherosclerotic change affecting the cavernous ICAs bilaterally, left greater than right, slightly  progressed from previous.  5. Short-segment 50% proximal left M1 stenosis.  6. Additional advanced for age diffuse atherosclerotic change throughout the extracranial intracranial circulation as above.    Ct Cerebral Perfusion W Contrast 01/23/2018 IMPRESSION:  No evidence for acute ischemia by CT perfusion.     Dg Chest Port 1 View 01/24/2018 IMPRESSION:  1. Swan-Ganz catheter, mediastinal tube and left chest tube removed. Left PICC line placed.  2. No pneumothorax or pulmonary edema.  3. Confluent left lung base opacity likely due to atelectasis +/- pleural effusion.    Dg Chest Port 1 View 01/23/2018 IMPRESSION:  1. Extubated and enteric tube removed. Otherwise stable lines and tubes.  2. Mildly  lower lung volumes with increased left lung base atelectasis. Probable small left pleural effusion.  3. No pneumothorax or pulmonary edema.    Dg Chest Port 1 View 02/03/2018 IMPRESSION:  1. Support apparatus positioned as above.  2. Left midlung atelectasis.  3. Left chest tube without pneumothorax.    [place] Ekg Site Rite 01/23/2018 [facility], placement could not be confirmed due to current cardiac rhythm.    Transthoracic Echocardiogram - pending 00/00/00  EEG : focal right hemispheric delta range slowing but no seizure activity  PHYSICAL EXAM Vitals:   01/24/18 0500 01/24/18 0600 01/24/18 0700 01/24/18 0745  BP: 125/79 (!) 146/74 121/90   Pulse: 97 95 98   Resp: (!) 25 (!) 25 (!) 26   Temp:    100.2 F (37.9 C)  TempSrc:    Oral  SpO2: 95% 96% 96%   Weight:      Height:       Middle aged   male not in distress. . Afebrile. Head is nontraumatic. Neck is supple without bruit.    Cardiac exam no murmur or gallop. Lungs are clear to auscultation. Distal pulses are well felt.  Neurological Exam :  Drowsy but arouses easily.  Speaks only a few words.  Follows only occasional midline commands.  Right gaze preference.  He can look to the left past midline.  Blinks to threat on the right but not on the left.  Fundi not visualized.  Mild left lower facial asymmetry.  Tongue midline.  Moves right upper and lower extremity purposefully against gravity without focal weakness.  Withdraws left upper extremity minimally and left lower extremity briskly to painful stimuli but will not hold it up against gravity.  Tone is normal on the right and slightly diminished on the left.  Right plantar downgoing left equivocal.  Gait not tested.    HOME MEDICATIONS:  Medications Prior to Admission  Medication Sig Dispense Refill  . aspirin EC 81 MG tablet Take 81 mg by mouth daily.     [name]. atorvastatin (LIPITOR) 40 MG tablet Take 40 mg by mouth daily.    . Azilsartan Medoxomil  (EDARBI) 80 MG TABS Take 80 mg by mouth daily.    [name]. BLACK CURRANT SEED OIL PO Take 5 mLs by mouth daily.    . clopidogrel (PLAVIX) 75 MG tablet Take 75 mg by mouth daily.    . Coenzyme Q10 (CO Q-10) 400 MG CAPS Take 400 mg by mouth daily.     . furosemide (LASIX) 20 MG tablet Take 20 mg by mouth at bedtime.     [name]. GARLIC PO Take 3 tablets by mouth daily.    . isosorbide-hydrALAZINE (BIDIL) 20-37.5 MG tablet Take 1 tablet by mouth 3 (three) times daily.    . metFORMIN (  GLUCOPHAGE-XR) 500 MG 24 hr tablet Take 500 mg by mouth 2 (two) times daily.    . montelukast (SINGULAIR) 10 MG tablet Take 10 mg by mouth at bedtime as needed (allergies).     [name] OVER THE COUNTER MEDICATION Take 1 tablet by mouth daily. blood sugar control otc supplement    . Probiotic Product (PROBIOTIC DAILY PO) Take 1 capsule by mouth 2 (two) times daily.    . traMADol (ULTRAM) 50 MG tablet Take 150 mg by mouth at bedtime as needed for moderate pain.     . Turmeric 500 MG CAPS Take 500 mg by mouth 2 (two) times daily.     . Vitamin D, Ergocalciferol, (DRISDOL) 50000 units CAPS capsule Take 50,000 Units by mouth 2 (two) times a week.        HOSPITAL MEDICATIONS:  . acetaminophen  1,000 mg Oral Q6H   Or  . acetaminophen (TYLENOL) oral liquid 160 mg/5 mL  1,000 mg Per Tube Q6H  . aspirin EC  325 mg Oral Daily   Or  . aspirin  324 mg Per Tube Daily  . bisacodyl  10 mg Oral Daily   Or  . bisacodyl  10 mg Rectal Daily  . Chlorhexidine Gluconate Cloth  6 each Topical Daily  . docusate sodium  200 mg Oral Daily  . enoxaparin (LOVENOX) injection  40 mg Subcutaneous QHS  . insulin aspart  0-24 Units Subcutaneous Q4H  . insulin detemir  25 Units Subcutaneous BID  . isosorbide mononitrate  30 mg Oral Daily  . pantoprazole  40 mg Oral Daily  . sodium chloride flush  10-40 mL Intracatheter Q12H  . sodium chloride flush  10-40 mL Intracatheter Q12H  . sodium chloride flush  3 mL Intravenous Q12H    ALLERGIES Allergies   Allergen Reactions  . Bee Venom Anaphylaxis and Swelling  . Metoprolol Shortness Of Breath  . Penicillins Swelling    Swelling in feet Has patient had a PCN reaction causing immediate rash, facial/tongue/throat swelling, SOB or lightheadedness with hypotension: No Has patient had a PCN reaction causing severe rash involving mucus membranes or skin necrosis: No Has patient had a PCN reaction that required hospitalization: No Has patient had a PCN reaction occurring within the last 10 years: No If all of the above answers are "NO", then may proceed with Cephalosporin use.  . Pollen Extract     UNSPECIFIED REACTION     PAST MEDICAL HISTORY Past Medical History:  Diagnosis Date  . CAD (coronary artery disease)   . Complication of anesthesia   . Diabetes mellitus without complication (HCC)   . Dyspnea    on exertion  . GERD (gastroesophageal reflux disease)   . Hypertension   . Ischemic cardiomyopathy   . Morbid obesity (HCC)   . Pneumonia   . Sleep apnea    pt. has not received his cpap yet  . Stroke [facility]) 12/30/2016    SURGICAL HISTORY Past Surgical History:  Procedure Laterality Date  . EXTERNAL EAR SURGERY  1995   right ear -removed keloid  . LEFT HEART CATH AND CORONARY ANGIOGRAPHY N/A 12/22/2017   Procedure: LEFT HEART CATH AND CORONARY ANGIOGRAPHY;  Surgeon: Elder Negus, MD;  Location: [facility] INVASIVE CV LAB;  Service: Cardiovascular;  Laterality: N/A;    ASSESSMENT/PLAN [name] is a 47 y.o. male with history of a previous stroke, obstructive sleep apnea, morbid obesity, ischemic cardiomyopathy, hypertension, diabetes mellitus, and coronary artery disease status post CABG presenting  with lethargy and left-sided weakness . He did not receive IV t-PA due to recent surgery.  Stroke:  Repeat CT in a.m. Left PICA territory infarct not consistent with patient's deficits.  Resultant left hemiparesis and visual field loss likely from right MCA infarcts.   Clinically silent small left cerebellar infarct.  Etiology suspect intraoperative hypotension and multiple brain infarcts in the setting of known right MCA stenosis  CT head - Small volume acute ischemic left PICA territory infarct.  CTA H&N - Severe near occlusive proximal right M1 stenosis,  MRI head - not performed  MRA head - not performe  EEG - pending  Carotid Doppler - CTA neck  2D Echo - pending  LDL 36  HgbA1c - 7.2  VTE prophylaxis - Lovenox Diet NPO time specified Fall precautions  aspirin 81 mg daily and clopidogrel 75 mg daily prior to admission, now on aspirin 300 mg suppository daily  Patient counseled to be compliant with his antithrombotic medications  Ongoing aggressive stroke risk factor management  Therapy recommendations:  pending  Disposition:  Pending  Hypertension  Stable  Permissive hypertension (OK if < 220/120) but gradually normalize in 5-7 days  Long-term BP goal normotensive  Hyperlipidemia  Home meds: Lipitor 40 mg daily not resumed in hospital  LDL 36, goal < 70  Resume Lipitor with PO access  Continue statin at discharge  Diabetes  HgbA1c 7.2, goal < 7.0  Uncontrolled  Other Stroke Risk Factors  Former cigarette smoker - quit 15 months ago.  ETOH use, advised to drink no more than 1 drink per day.  Obesity, Body mass index is 43.01 kg/m., recommend weight loss, diet and exercise as appropriate   Hx stroke/TIA  Coronary artery disease  Obstructive sleep apnea  Other Active Problems  Postop anemia  Leukocytosis  Mildly elevated BUN and creatinine   Plan / Recommendations   Repeat CT scan in a.m.  Await 2-D echo and therapists evaluations.  Await EEG  Await therapists evaluations   Hospital day # 2  Delton See PA-C [facility] Pager [phone] 01/24/2018, 1:35 PM  I have personally examined this patient, reviewed notes, independently viewed imaging studies, participated  in medical decision making and plan of care.ROS completed by me personally and pertinent positives fully documented  I have made any additions or clarifications directly to the above note. Agree with note above.  He has developed left hemiparesis following recent cardiac surgery and suspect right hemispheric infarcts possibly watershed given severe right MCA stenosis and peri-procedure hypertension.  He also has small left PICA infarct which is clinically asymptomatic.  Recommend repeat CT scan of the head tomorrow.  Continue aspirin.  Maintain systolic blood pressure between 120 and 180.  Long discussion with the patient's fianc at the bedside and answered questions about his care. This patient is critically ill and at significant risk of neurological worsening, death and care requires constant monitoring of vital signs, hemodynamics,respiratory and cardiac monitoring, extensive review of multiple databases, frequent neurological assessment, discussion with family, other specialists and medical decision making of high complexity.I have made any additions or clarifications directly to the above note.This critical care time does not reflect procedure time, or teaching time or supervisory time of PA/NP/Med Resident etc but could involve care discussion time.  I spent 30 minutes of neurocritical care time  in the care of  this patient.      Delia Heady, MD Medical Director [facility] Pager: [phone] 01/24/2018 2:26 PM  To contact Stroke Continuity provider, please refer to [email or web address] After hours, contact General Neurology

## 2018-01-24 NOTE — Progress Notes (Signed)
EEG completed; results pending.    

## 2018-01-25 ENCOUNTER — Inpatient Hospital Stay (HOSPITAL_COMMUNITY): Payer: BLUE CROSS/BLUE SHIELD

## 2018-01-25 ENCOUNTER — Encounter (HOSPITAL_COMMUNITY): Payer: Self-pay | Admitting: Thoracic Surgery (Cardiothoracic Vascular Surgery)

## 2018-01-25 DIAGNOSIS — I2510 Atherosclerotic heart disease of native coronary artery without angina pectoris: Secondary | ICD-10-CM

## 2018-01-25 LAB — BASIC METABOLIC PANEL
Anion gap: 12 (ref 5–15)
BUN: 37 mg/dL — ABNORMAL HIGH (ref 6–20)
CALCIUM: 8.2 mg/dL — AB (ref 8.9–10.3)
CO2: 22 mmol/L (ref 22–32)
CREATININE: 1.42 mg/dL — AB (ref 0.61–1.24)
Chloride: 107 mmol/L (ref 101–111)
GFR calc Af Amer: >60 mL/min (ref >60)
GFR calc non Af Amer: 57 mL/min — ABNORMAL LOW (ref >60)
GLUCOSE: 141 mg/dL — AB (ref 65–99)
Potassium: 4.4 mmol/L (ref 3.5–5.1)
Sodium: 141 mmol/L (ref 135–145)

## 2018-01-25 LAB — GLUCOSE, CAPILLARY
GLUCOSE-CAPILLARY: 118 mg/dL — AB (ref 65–99)
GLUCOSE-CAPILLARY: 152 mg/dL — AB (ref 65–99)
GLUCOSE-CAPILLARY: 144 mg/dL — AB (ref 65–99)
GLUCOSE-CAPILLARY: 130 mg/dL — AB (ref 65–99)
GLUCOSE-CAPILLARY: 129 mg/dL — AB (ref 65–99)
Glucose-Capillary: 152 mg/dL — ABNORMAL HIGH (ref 65–99)
Glucose-Capillary: 128 mg/dL — ABNORMAL HIGH (ref 65–99)

## 2018-01-25 LAB — CBC
HCT: 24.1 % — ABNORMAL LOW (ref 39.0–52.0)
Hemoglobin: 7.6 g/dL — ABNORMAL LOW (ref 13.0–17.0)
MCH: 29.2 pg (ref 26.0–34.0)
MCHC: 31.5 g/dL (ref 30.0–36.0)
MCV: 92.7 fL (ref 78.0–100.0)
PLATELETS: 226 10*3/uL (ref 150–400)
RBC: 2.6 MIL/uL — ABNORMAL LOW (ref 4.22–5.81)
RDW: 16.1 % — AB (ref 11.5–15.5)
WBC: 16.7 10*3/uL — ABNORMAL HIGH (ref 4.0–10.5)

## 2018-01-25 LAB — POCT I-STAT 4, (NA,K, GLUC, HGB,HCT)
Glucose, Bld: 155 mg/dL — ABNORMAL HIGH (ref 65–99)
HCT: 26 % — ABNORMAL LOW (ref 39.0–52.0)
Hemoglobin: 8.8 g/dL — ABNORMAL LOW (ref 13.0–17.0)
Potassium: 4.2 mmol/L (ref 3.5–5.1)
Sodium: 139 mmol/L (ref 135–145)

## 2018-01-25 LAB — ECHOCARDIOGRAM COMPLETE
Height: 74 in
WEIGHTICAEL: 5241.66 [oz_av]

## 2018-01-25 MED ORDER — PERFLUTREN LIPID MICROSPHERE
1.0000 mL | INTRAVENOUS | Status: AC | PRN
  Administered 2018-01-25: 2 mL via INTRAVENOUS
  Filled 2018-01-25: qty 10

## 2018-01-25 MED ORDER — METOCLOPRAMIDE HCL 5 MG/ML IJ SOLN
10.0000 mg | Freq: Once | INTRAMUSCULAR | Status: AC
  Administered 2018-01-25: 10 mg via INTRAVENOUS

## 2018-01-25 MED ORDER — PERFLUTREN LIPID MICROSPHERE
INTRAVENOUS | Status: AC
  Filled 2018-01-25: qty 10

## 2018-01-25 NOTE — Progress Notes (Signed)
3 Days Post-Op Procedure(s) (LRB): CORONARY ARTERY BYPASS GRAFTING (CABG) TIMES Four,  USING LEFT INTERNAL MAMMARY ARTERY, RIGHT RADIAL ARTERY AND RIGHT SAPHENOUS LEG VEIN HARVESTED ENDOSCOPICALLY (N/A) RADIAL ARTERY HARVEST (Right) TRANSESOPHAGEAL ECHOCARDIOGRAM (TEE) (N/A) Subjective: Lethargic, does respond to requests  Objective: Vital signs in last 24 hours: Temp:  [97 F (36.1 C)-100.6 F (38.1 C)] 99.4 F (37.4 C) (03/04 0729) Pulse Rate:  [96-109] 97 (03/04 0600) Cardiac Rhythm: Normal sinus rhythm;Sinus tachycardia (03/04 0400) Resp:  [16-30] 23 (03/04 0600) BP: (90-169)/(64-100) 169/96 (03/04 0600) SpO2:  [87 %-100 %] 99 % (03/04 0600) Weight:  [327 lb 9.7 oz (148.6 kg)] 327 lb 9.7 oz (148.6 kg) (03/04 0500)  Hemodynamic parameters for last 24 hours:    Intake/Output from previous day: 03/03 0701 - 03/04 0700 In: 335 [I.V.:20; Blood:315] Out: 2150 [Urine:2150] Intake/Output this shift: No intake/output data recorded.  General appearance: no distress and slowed mentation Neurologic: left sided weakness Heart: regular rate and rhythm Lungs: diminished breath sounds bibasilar Abdomen: normal findings: soft, non-tender  Lab Results: Recent Labs    01/23/18 1525 01/24/18 0409 01/24/18 1600  WBC 17.8* 18.3*  --   HGB 7.3* 6.9* 7.1*  HCT 23.1* 21.0* 21.0*  PLT 209 176  --    BMET:  Recent Labs    01/23/18 0420 01/23/18 1521 01/23/18 1525 01/24/18 0409 01/24/18 1600  NA 136 136  --  136 141  K 4.9 4.7  --  4.3 4.3  CL 105 102  --  105  --   CO2 21*  --   --  22  --   GLUCOSE 118* 153*  --  182* 195*  BUN 22* 23*  --  26*  --   CREATININE 1.23 1.30* 1.34* 1.35*  --   CALCIUM 7.8*  --   --  7.8*  --     PT/INR:  Recent Labs    01/29/2018 1455  LABPROT 16.4*  INR 1.33   ABG    Component Value Date/Time   PHART 7.340 (L) 01/23/2018 0846   HCO3 22.2 01/23/2018 0846   TCO2 23 01/23/2018 1521   ACIDBASEDEF 3.0 (H) 01/23/2018 0846   O2SAT 99.0  01/23/2018 0846   CBG (last 3)  Recent Labs    01/24/18 2048 01/24/18 2331 01/25/18 0414  GLUCAP 156* 158* 152*    Assessment/Plan: S/P Procedure(s) (LRB): CORONARY ARTERY BYPASS GRAFTING (CABG) TIMES Four,  USING LEFT INTERNAL MAMMARY ARTERY, RIGHT RADIAL ARTERY AND RIGHT SAPHENOUS LEG VEIN HARVESTED ENDOSCOPICALLY (N/A) RADIAL ARTERY HARVEST (Right) TRANSESOPHAGEAL ECHOCARDIOGRAM (TEE) (N/A) -  NEURO- Right CVA with left hemiparesis, CT this AM shows some edema  paln per Neuro  PT/OT/ Speech  CV- stable in SR  RESP- IS as able  RENAL- diuresing, labs pending  ENDO- CBG mildly elevated  Enoxaparin for DVT prophylaxis   LOS: 3 days    Loreli SlotSteven C Brandon Scarbrough 01/25/2018

## 2018-01-25 NOTE — Progress Notes (Signed)
Patient ID: Nicholas Mejia, male   DOB: 09-05-1971, 47 y.o.   MRN: [id number] [facility] Evening Rounds:  Hemodynamically stable in sinus rhythm.  sats 97% Good urine output.  Cortrak placed today and tube feeds started.  Stroke team following for acute periop stroke.

## 2018-01-25 NOTE — Progress Notes (Signed)
Occupational Therapy Evaluation Patient Details Name: Nicholas Mejia MRN: [id number] DOB: 12-Feb-1971 Today's Date: 01/25/2018    History of Present Illness Nicholas Mejia is a 47 year old man with a past medical history significant for stroke, type 2 diabetes without complication, hypertension, morbid obesity, coronary artery disease, and ischemic cardiomyopathy. Now S/p CABG x4;  Postop Imaging reveals Parenchymal hypodensity within the inferior left cerebellar hemisphere consistent with acute ischemic infarct, left PICA territory, R corona radiata and R caudate nucleus.  Remote R MCA infarct as well - [facility]. He was recently diagnosed with sleep apnea.    Clinical Impression   PTA, pt lived alone, was independent with ADL and mobility and worked in Consulting civil engineerT at [employer]. His fiance Nicholas Mejia(Nicholas Mejia) lives in [place]. Evaluation limited due to nurse/tech coming to place coretrack. Will complete eval next session. Did not observe pt spontaneously moving LUE, but pt withdraws LUE from painful stimuli in flexor synergy pattern. Appears to have R gaze preference (fiance staes pt is visually impaired in L eye)Difficult to maintain arousal this date. Pt will need extensive postacute rehab. He has a very supportive family. Recommend CIR screen. Will address DC recommendations as pt's level of arousal and ability to participate with OT improves.     Follow Up Recommendations  CIR;Supervision/Assistance - 24 hour    Equipment Recommendations  Other (comment)(TBA)    Recommendations for Other Services Rehab consult     Precautions / Restrictions Precautions Precautions: Sternal;Fall Restrictions Other Position/Activity Restrictions: Sternal precautions      Mobility Bed Mobility                 Transfers    not attempted                  Balance                                           ADL either performed or assessed with  clinical judgement   ADL Overall ADL's : Needs assistance/impaired Eating/Feeding: NPO   Grooming: Maximal assistance Grooming Details (indicate cue type and reason): Able to wipe face                               General ADL Comments: total A at this time     Vision Baseline Vision/History: Wears glasses Wears Glasses: At all times Vision Assessment?: Vision impaired- to be further tested in functional context Additional Comments: R gaze preference; L eye impairment PTA per fiance; will further assess     Perception Perception Comments: L inattention   Praxis Praxis Praxis-Other Comments: delay in initiation; will further assess    Pertinent Vitals/Pain Pain Assessment: No/denies pain Faces Pain Scale: Hurts a little bit Pain Intervention(s): Monitored during session     Hand Dominance Left   Extremity/Trunk Assessment Upper Extremity Assessment Upper Extremity Assessment: LUE deficits/detail LUE Deficits / Details: fiance states she has seen pt move R arm; Not moving to command; withdraws in flexor synergy pattern; dependent edema L hand LUE Coordination: decreased fine motor;decreased gross motor(non-functional at this time)   Lower Extremity Assessment Lower Extremity Assessment: LLE deficits/detail RLE Deficits / Details: no movement noted during assessment; will further assess; May need PRAFO       Communication Communication Communication: Expressive difficulties   Cognition  Arousal/Alertness: Lethargic Behavior During Therapy: Flat affect Overall Cognitive Status: Impaired/Different from baseline Area of Impairment: Attention;Following commands;Awareness;Problem solving;Safety/judgement                   Current Attention Level: Sustained   Following Commands: Follows one step commands inconsistently       General Comments: very lethargic. Difficult to assess   General Comments       Exercises     Shoulder Instructions       Home Living Family/patient expects to be discharged to:: Private residence Living Arrangements: Alone Available Help at Discharge: Family;Friend(s);Available PRN/intermittently Type of Home: Apartment Home Access: Stairs to enter Entrance Stairs-Number of Steps: 12-14 Entrance Stairs-Rails: Right Home Layout: One level     Bathroom Shower/Tub: Chief Strategy Officer: Standard Bathroom Accessibility: Yes How Accessible: Accessible via walker Home Equipment: None          Prior Functioning/Environment Level of Independence: Independent        Comments: 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[employer]; likes to travel and go shopping per fiance who lives in [place]         [place] Problem List: Decreased strength;Decreased range of motion;Decreased activity tolerance;Impaired balance (sitting and/or standing);Impaired vision/perception;Decreased coordination;Decreased cognition;Decreased safety awareness;Decreased knowledge of use of DME or AE;Decreased knowledge of precautions;Impaired sensation;Cardiopulmonary status limiting activity;Impaired tone;Obesity;Impaired UE functional use;Increased edema;Pain      OT Treatment/Interventions: Self-care/ADL training;Therapeutic exercise;Neuromuscular education;DME and/or AE instruction;Therapeutic activities;Cognitive remediation/compensation;Visual/perceptual remediation/compensation;Patient/family education;Balance training;Splinting    OT Goals(Current goals can be found in the care plan section) Acute Rehab OT Goals Patient Stated Goal: to get better - per family OT Goal Formulation: With family Time For Goal Achievement: 02/08/18 Potential to Achieve Goals: Good  OT Frequency: Min 2X/week   Barriers to D/C:            Co-evaluation              AM-PAC PT "6 Clicks" Daily Activity     Outcome Measure Help from another person eating meals?: Total Help from another person taking care of personal grooming?:  Total Help from another person toileting, which includes using toliet, bedpan, or urinal?: Total Help from another person bathing (including washing, rinsing, drying)?: Total Help from another person to put on and taking off regular upper body clothing?: Total Help from another person to put on and taking off regular lower body clothing?: Total 6 Click Score: 6   End of Session Nurse Communication: Mobility status;Other (comment)(will complete eval tomorrow)  Activity Tolerance: Patient limited by lethargy;Other (comment)(limited by procedure) Patient left: in bed;with call bell/phone within reach;with family/visitor present;with nursing/sitter in room  OT Visit Diagnosis: Other abnormalities of gait and mobility (R26.89);Muscle weakness (generalized) (M62.81);Low vision, both eyes (H54.2);Other symptoms and signs involving cognitive function;Hemiplegia and hemiparesis Hemiplegia - Right/Left: Left Hemiplegia - dominant/non-dominant: Dominant Hemiplegia - caused by: Cerebral infarction                Time: 1555-1610 OT Time Calculation (min): 15 min Charges:  OT General Charges $OT Visit: 1 Visit OT Evaluation $OT Eval Moderate Complexity: 1 Mod G-Codes:     Kahlel Peake, OT/L  [phone] 01/25/2018  Yuleimy Kretz,HILLARY 01/25/2018, 3:35 PM

## 2018-01-25 NOTE — Progress Notes (Signed)
STROKE TEAM PROGRESS NOTE   HISTORY OF PRESENT ILLNESS (per record) Nicholas Mejia is an 47 y.o. male with PMH of CAD, Diabetes Mellitus, HTN, Morbid obesity, OSA Admitted for CABG. Patient underwent CABG procedure on 3.1.19 and has remained lethargic since then. Today patient was more alert, however noted to not have been moving left side and Neurology was consulted for concern of stroke.   CT head showed new left PICA infarct, CTA showed severe R m1 stenosis. CT perfusion was done due to concern for LVO which was negative for large infarct/penumbra in R MCA territory.  Date last known well: 02/07/18 Time last known well: morning tPA Given: no, outside window NIHSS:  Baseline MRS     SUBJECTIVE (INTERVAL HISTORY) The patient's fianc is at the bedside. She feels the patient is doing a little better. The patient follows minimal commands   occasionally speaks to her. He remains NPO. Repeat CT head showed new patchy hypodensities in the right corona radiata and right caudate nucleus consistent with acute infarcts.    OBJECTIVE Temp:  [98.6 F (37 C)-100.6 F (38.1 C)] 99.1 F (37.3 C) (03/04 1127) Pulse Rate:  [92-109] 100 (03/04 1000) Cardiac Rhythm: Normal sinus rhythm (03/04 1200) Resp:  [16-27] 19 (03/04 1000) BP: (90-169)/(64-99) 146/99 (03/04 1000) SpO2:  [87 %-100 %] 98 % (03/04 1000) Weight:  [327 lb 9.7 oz (148.6 kg)] 327 lb 9.7 oz (148.6 kg) (03/04 0500)  CBC:  Recent Labs  Lab 01/24/18 0409 01/24/18 1600 01/25/18 0731  WBC 18.3*  --  16.7*  HGB 6.9* 7.1* 7.6*  HCT 21.0* 21.0* 24.1*  MCV 91.3  --  92.7  PLT 176  --  226    Basic Metabolic Panel:  Recent Labs  Lab 01/23/18 0420  01/23/18 1525 01/24/18 0409 01/24/18 1600 01/25/18 0731  NA 136   < >  --  136 141 141  K 4.9   < >  --  4.3 4.3 4.4  CL 105   < >  --  105  --  107  CO2 21*  --   --  22  --  22  GLUCOSE 118*   < >  --  182* 195* 141*  BUN 22*   < >  --  26*  --  37*  CREATININE 1.23   < >  1.34* 1.35*  --  1.42*  CALCIUM 7.8*  --   --  7.8*  --  8.2*  MG 2.4  --  2.5*  --   --   --    < > = values in this interval not displayed.    Lipid Panel:     Component Value Date/Time   CHOL 83 01/24/2018 0409   TRIG 80 01/24/2018 0409   HDL 31 (L) 01/24/2018 0409   CHOLHDL 2.7 01/24/2018 0409   VLDL 16 01/24/2018 0409   LDLCALC 36 01/24/2018 0409   HgbA1c:  Lab Results  Component Value Date   HGBA1C 7.2 (H) 01/24/2018   Urine Drug Screen:     Component Value Date/Time   LABOPIA NONE DETECTED 12/30/2016 2146   COCAINSCRNUR NONE DETECTED 12/30/2016 2146   LABBENZ NONE DETECTED 12/30/2016 2146   AMPHETMU NONE DETECTED 12/30/2016 2146   THCU NONE DETECTED 12/30/2016 2146   LABBARB NONE DETECTED 12/30/2016 2146    Alcohol Level No results found for: ETH  IMAGING  Ct Angio Head W Or Wo Contrast Ct Angio Neck W Or Wo Contrast 01/23/2018 IMPRESSION:  1.  Negative CTA for large vessel occlusion.  2. 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 Left PICA is patent proximally.  3. 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 While subocclusive thrombus would be difficult to exclude, this is felt to be less likely. Query an episode of recent hypotension, which superimposed on the underlying stenosis could result in patient's left-sided symptoms.  4. Advanced atherosclerotic change affecting the cavernous ICAs bilaterally, left greater than right, slightly progressed from previous.  5. Short-segment 50% proximal left M1 stenosis.  6. Additional advanced for age diffuse atherosclerotic change throughout the extracranial intracranial circulation as above.    Ct Cerebral Perfusion W Contrast 01/23/2018 IMPRESSION:  No evidence for acute ischemia by CT perfusion.     Dg Chest Port 1 View 01/24/2018 IMPRESSION:  1. Swan-Ganz catheter, mediastinal tube and left chest tube removed. Left PICC line placed.  2. No  pneumothorax or pulmonary edema.  3. Confluent left lung base opacity likely due to atelectasis +/- pleural effusion.    Dg Chest Port 1 View 01/23/2018 IMPRESSION:  1. Extubated and enteric tube removed. Otherwise stable lines and tubes.  2. Mildly lower lung volumes with increased left lung base atelectasis. Probable small left pleural effusion.  3. No pneumothorax or pulmonary edema.    Dg Chest Port 1 View 02/11/2018 IMPRESSION:  1. Support apparatus positioned as above.  2. Left midlung atelectasis.  3. Left chest tube without pneumothorax.    [place] Ekg Site Rite 01/23/2018 If [facility] image not attached, placement could not be confirmed due to current cardiac rhythm.    Transthoracic Echocardiogram - pending 00/00/00  EEG : focal right hemispheric delta range slowing but no seizure activity Repeat CT head 01/25/2018 : new patchy hypodensities in the right corona radiata and right caudate nucleus consistent with acute infarcts.Subacuteleft small PICA infarct unchanged PHYSICAL EXAM Vitals:   01/25/18 0800 01/25/18 0900 01/25/18 1000 01/25/18 1127  BP: (!) 142/91 (!) 148/93 (!) 146/99   Pulse: 95 96 100   Resp: (!) 22 (!) 22 19   Temp:    99.1 F (37.3 C)  TempSrc:    Axillary  SpO2: 99% 98% 98%   Weight:      Height:       Middle aged   male not in distress. . Afebrile. Head is nontraumatic. Neck is supple without bruit.    Cardiac exam no murmur or gallop. Lungs are clear to auscultation. Distal pulses are well felt.  Neurological Exam :  Drowsy but arouses easily.  Speaks only a few words.  Follows only occasional midline commands.  Right gaze preference.  He can look to the left past midline.  Blinks to threat on the right but not on the left.  Fundi not visualized.  Mild left lower facial asymmetry.  Tongue midline.  Moves right upper and lower extremity purposefully against gravity without focal weakness.  Withdraws left upper extremity minimally and left lower  extremity briskly to painful stimuli but will not hold it up against gravity.  Tone is normal on the right and slightly diminished on the left.  Right plantar downgoing left equivocal.  Gait not tested.    HOME MEDICATIONS:  Medications Prior to Admission  Medication Sig Dispense Refill  . aspirin EC 81 MG tablet Take 81 mg by mouth daily.     [name] atorvastatin (LIPITOR) 40 MG tablet Take 40 mg by mouth daily.    . Azilsartan Medoxomil (  EDARBI) 80 MG TABS Take 80 mg by mouth daily.    [name] BLACK CURRANT SEED OIL PO Take 5 mLs by mouth daily.    . clopidogrel (PLAVIX) 75 MG tablet Take 75 mg by mouth daily.    . Coenzyme Q10 (CO Q-10) 400 MG CAPS Take 400 mg by mouth daily.     . furosemide (LASIX) 20 MG tablet Take 20 mg by mouth at bedtime.     [name] GARLIC PO Take 3 tablets by mouth daily.    . isosorbide-hydrALAZINE (BIDIL) 20-37.5 MG tablet Take 1 tablet by mouth 3 (three) times daily.    . metFORMIN (GLUCOPHAGE-XR) 500 MG 24 hr tablet Take 500 mg by mouth 2 (two) times daily.    . montelukast (SINGULAIR) 10 MG tablet Take 10 mg by mouth at bedtime as needed (allergies).     [name] OVER THE COUNTER MEDICATION Take 1 tablet by mouth daily. blood sugar control otc supplement    . Probiotic Product (PROBIOTIC DAILY PO) Take 1 capsule by mouth 2 (two) times daily.    . traMADol (ULTRAM) 50 MG tablet Take 150 mg by mouth at bedtime as needed for moderate pain.     . Turmeric 500 MG CAPS Take 500 mg by mouth 2 (two) times daily.     . Vitamin D, Ergocalciferol, (DRISDOL) 50000 units CAPS capsule Take 50,000 Units by mouth 2 (two) times a week.        HOSPITAL MEDICATIONS:  . acetaminophen  1,000 mg Oral Q6H   Or  . acetaminophen (TYLENOL) oral liquid 160 mg/5 mL  1,000 mg Per Tube Q6H  . aspirin  300 mg Rectal Daily  . bisacodyl  10 mg Oral Daily   Or  . bisacodyl  10 mg Rectal Daily  . Chlorhexidine Gluconate Cloth  6 each Topical Daily  . clopidogrel  75 mg Oral Daily  . docusate sodium  200 mg  Oral Daily  . enoxaparin (LOVENOX) injection  40 mg Subcutaneous QHS  . insulin aspart  0-24 Units Subcutaneous Q4H  . insulin detemir  30 Units Subcutaneous BID  . isosorbide-hydrALAZINE  1 tablet Oral TID  . mouth rinse  15 mL Mouth Rinse BID  . pantoprazole  40 mg Oral Daily    ALLERGIES Allergies  Allergen Reactions  . Bee Venom Anaphylaxis and Swelling  . Metoprolol Shortness Of Breath  . Penicillins Swelling    Swelling in feet Has patient had a PCN reaction causing immediate rash, facial/tongue/throat swelling, SOB or lightheadedness with hypotension: No Has patient had a PCN reaction causing severe rash involving mucus membranes or skin necrosis: No Has patient had a PCN reaction that required hospitalization: No Has patient had a PCN reaction occurring within the last 10 years: No If all of the above answers are "NO", then may proceed with Cephalosporin use.  . Pollen Extract     UNSPECIFIED REACTION     PAST MEDICAL HISTORY Past Medical History:  Diagnosis Date  . CAD (coronary artery disease)   . Complication of anesthesia   . Diabetes mellitus without complication (HCC)   . Dyspnea    on exertion  . GERD (gastroesophageal reflux disease)   . Hypertension   . Ischemic cardiomyopathy   . Morbid obesity (HCC)   . Pneumonia   . Sleep apnea    pt. has not received his cpap yet  . Stroke [facility]) 12/30/2016    SURGICAL HISTORY Past Surgical History:  Procedure Laterality Date  .  CORONARY ARTERY BYPASS GRAFT N/A 02/21/2018   Procedure: CORONARY ARTERY BYPASS GRAFTING (CABG) TIMES Four,  USING LEFT INTERNAL MAMMARY ARTERY, RIGHT RADIAL ARTERY AND RIGHT SAPHENOUS LEG VEIN HARVESTED ENDOSCOPICALLY;  Surgeon: Loreli SlotHendrickson, Steven C, MD;  [facility];  Service: Open Heart Surgery;  Laterality: N/A;  . EXTERNAL EAR SURGERY  1995   right ear -removed keloid  . LEFT HEART CATH AND CORONARY ANGIOGRAPHY N/A 12/22/2017   Procedure: LEFT HEART CATH AND CORONARY ANGIOGRAPHY;   Surgeon: Elder NegusPatwardhan, Manish J, MD;  Location: [facility] INVASIVE CV LAB;  Service: Cardiovascular;  Laterality: N/A;  . RADIAL ARTERY HARVEST Right 02/11/2018   Procedure: RADIAL ARTERY HARVEST;  Surgeon: Loreli SlotHendrickson, Steven C, MD;  [facility];  Service: Open Heart Surgery;  Laterality: Right;  . TEE WITHOUT CARDIOVERSION N/A 02/10/2018   Procedure: TRANSESOPHAGEAL ECHOCARDIOGRAM (TEE);  Surgeon: Loreli SlotHendrickson, Steven C, MD;  [facility];  Service: Open Heart Surgery;  Laterality: N/A;    ASSESSMENT/PLAN [name] is a 47 y.o. male with history of a previous stroke, obstructive sleep apnea, morbid obesity, ischemic cardiomyopathy, hypertension, diabetes mellitus, and coronary artery disease status post CABG presenting with lethargy and left-sided weakness . He did not receive IV t-PA due to recent surgery.  Stroke: Right subcortical infarcts due to RMCA stenosis and post CABG.  Left PICA territory infarct not consistent with patient's deficits.  Resultant left hemiparesis and visual field loss likely from right MCA infarcts.  Clinically silent small left cerebellar infarct.  Etiology suspect intraoperative hypotension and multiple brain infarcts in the setting of known right MCA stenosis  CT head - Small volume acute ischemic left PICA territory infarct.  CTA H&N - Severe near occlusive proximal right M1 stenosis,  MRI head - not performed  MRA head - not performe  EEG -  focal right hemispheric delta range slowing but no seizure activity  Carotid Doppler - CTA neck 2D Echo - Left ventricle: The cavity size was normal. Systolic function was   normal. The estimated ejection fraction was in the range of 55%   to 60%. Wall motion was normal; there were no regional wall    motion abnormalities  LDL 36  HgbA1c - 7.2  VTE prophylaxis - Lovenox Diet NPO time specified Fall precautions  aspirin 81 mg daily and clopidogrel 75 mg daily prior to admission, now on aspirin 300 mg  suppository daily  Patient counseled to be compliant with his antithrombotic medications  Ongoing aggressive stroke risk factor management  Therapy recommendations:  pending  Disposition:  Pending  Hypertension  Stable  Permissive hypertension (OK if < 220/120) but gradually normalize in 5-7 days  Long-term BP goal normotensive  Hyperlipidemia  Home meds: Lipitor 40 mg daily not resumed in hospital  LDL 36, goal < 70  Resume Lipitor with PO access  Continue statin at discharge  Diabetes  HgbA1c 7.2, goal < 7.0  Uncontrolled  Other Stroke Risk Factors  Former cigarette smoker - quit 15 months ago.  ETOH use, advised to drink no more than 1 drink per day.  Obesity, Body mass index is 42.06 kg/m., recommend weight loss, diet and exercise as appropriate   Hx stroke/TIA  Coronary artery disease  Obstructive sleep apnea  Other Active Problems  Postop anemia  Leukocytosis  Mildly elevated BUN and creatinine   Plan / Recommendations   Start aspirin  Await therapists evaluations   Hospital day # 3     I have personally examined this patient, reviewed  notes, independently viewed imaging studies, participated in medical decision making and plan of care.ROS completed by me personally and pertinent positives fully documented  I have made any additions or clarifications directly to the above note.    He has developed left hemiparesis following recent cardiac surgery and suspect right hemispheric infarcts possibly watershed given severe right MCA stenosis and peri-procedure hypotension.  He also has small left PICA infarct which is clinically asymptomatic.     Continue aspirin.  Maintain systolic blood pressure between 120 and 180.  Long discussion with the patient's fianc at the bedside and answered questions about his care.PT/OT Rehab consults. Stroke service will sign off Call for questions. This patient is critically ill and at significant risk of  neurological worsening, death and care requires constant monitoring of vital signs, hemodynamics,respiratory and cardiac monitoring, extensive review of multiple databases, frequent neurological assessment, discussion with family, other specialists and medical decision making of high complexity.I have made any additions or clarifications directly to the above note.This critical care time does not reflect procedure time, or teaching time or supervisory time of PA/NP/Med Resident etc but could involve care discussion time.  I spent 30 minutes of neurocritical care time  in the care of  this patient.      Delia Heady, MD Medical Director [facility] Pager: [phone] 01/25/2018 2:17 PM  To contact Stroke Continuity provider, please refer to [email or web address]. After hours, contact General Neurology

## 2018-01-25 NOTE — Evaluation (Signed)
Speech Language Pathology Evaluation Patient Details Name: Nicholas Mejia MRN: [id number] DOB: 1971-04-01 Today's Date: 01/25/2018 Time: [id number] SLP Time Calculation (min) (ACUTE ONLY): 21 min  Problem List:  Patient Active Problem List   Diagnosis Date Noted  . Cerebral thrombosis with cerebral infarction 01/24/2018  . S/P CABG x 4 02/18/2018  . Ischemic cardiomyopathy   . Abnormal stress test 12/21/2017  . Nausea & vomiting   . Indigestion   . Gastroesophageal reflux disease   . Uncontrolled type 2 diabetes mellitus with complication, without long-term current use of insulin (HCC)   . Non-intractable vomiting with nausea   . Stage 2 chronic kidney disease   . Ataxia, post-stroke   . Stage 3 chronic kidney disease (HCC)   . Hyponatremia   . Type II diabetes mellitus, uncontrolled (HCC)   . Benign essential HTN   . Mixed hyperlipidemia   . 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 Diabetes mellitus without complication (HCC) 12/31/2016  . AKI (acute kidney injury) (HCC) 12/31/2016  . Leukocytosis 12/31/2016  . Essential hypertension 12/31/2016   Past Medical History:  Past Medical History:  Diagnosis Date  . CAD (coronary artery disease)   . Complication of anesthesia   . Diabetes mellitus without complication (HCC)   . Dyspnea    on exertion  . GERD (gastroesophageal reflux disease)   . Hypertension   . Ischemic cardiomyopathy   . Morbid obesity (HCC)   . Pneumonia   . Sleep apnea    pt. has not received his cpap yet  . Stroke [facility](HCC) 12/30/2016   Past Surgical History:  Past Surgical History:  Procedure Laterality Date  . CORONARY ARTERY BYPASS GRAFT N/A 02/13/2018   Procedure: CORONARY ARTERY BYPASS GRAFTING (CABG) TIMES Four,  USING LEFT INTERNAL MAMMARY ARTERY, RIGHT RADIAL ARTERY AND RIGHT SAPHENOUS LEG VEIN HARVESTED ENDOSCOPICALLY;  Surgeon: Loreli SlotHendrickson, Steven C, MD;  [facility];  Service:  Open Heart Surgery;  Laterality: N/A;  . EXTERNAL EAR SURGERY  1995   right ear -removed keloid  . LEFT HEART CATH AND CORONARY ANGIOGRAPHY N/A 12/22/2017   Procedure: LEFT HEART CATH AND CORONARY ANGIOGRAPHY;  Surgeon: Elder NegusPatwardhan, Manish J, MD;  [facility] INVAS[facility] CV LAB;  Service: Cardiovascular;  Laterality: N/A;  . RADIAL ARTERY HARVEST Right 02/13/2018   Procedure: RADIAL ARTERY HARVEST;  Surgeon: Loreli SlotHendrickson, Steven C, MD;  [facility];  Service: Open Heart Surgery;  Laterality: Right;  . TEE WITHOUT CARDIOVERSION N/A 02/08/2018   Procedure: TRANSESOPHAGEAL ECHOCARDIOGRAM (TEE);  Surgeon: Loreli SlotHendrickson, Steven C, MD;  [facility];  Service: Open Heart Surgery;  Laterality: N/A;   HPI:  Nicholas SparWarren Stavely is an 47 y.o. male with PMH of CAD, Diabetes Mellitus, HTN, Morbid obesity, OSA. Admitted for CABG. Patient underwent CABG procedure on 3.1.19 and has remained lethargic since then. Pt noted to not have been moving left side and Neurology was consulted for concern of stroke. CT head showed new patchy hypodensities in the right corona radiata and right caudate nucleus consistent with acute infarcts. CTA showed severe R m1 stenosis.  Intubated for procedure only   Assessment / Plan / Recommendation Clinical Impression  Pt with increased lethargy  today, per fiance requiring verbal and tactile stimulation thoroughout. Assessement limited and will be ongoing due to lethargy but demonstrated dysarthria, language impairment with suspected right brain dominant for language considering location of stroke and deficits. Difficulty repeating simple phrases, followed one step commands  with 50-60% accuracy and biographical responses less than 50%. Pt will need intense ST for intervention of communication-cognitive abilities.       SLP Assessment  SLP Recommendation/Assessment: Patient needs continued Speech Lanaguage Pathology Services SLP Visit Diagnosis: Attention and concentration deficit;Cognitive  communication deficit (R41.841) Attention and concentration deficit following: Cerebral infarction    Follow Up Recommendations  Inpatient Rehab    Frequency and Duration min 2x/week  2 weeks      SLP Evaluation Cognition  Overall Cognitive Status: Impaired/Different from baseline Arousal/Alertness: Lethargic Orientation Level: Oriented to person;Disoriented to place;Disoriented to time;Disoriented to situation Attention: Sustained Sustained Attention: Impaired Sustained Attention Impairment: Verbal basic Memory: (TBA) Awareness: Impaired Awareness Impairment: Intellectual impairment;Emergent impairment;Anticipatory impairment Problem Solving: (TBA further) Safety/Judgment: Impaired       Comprehension  Auditory Comprehension Overall Auditory Comprehension: Impaired Yes/No Questions: Impaired Basic Biographical Questions: 26-50% accurate Commands: Impaired One Step Basic Commands: 50-74% accurate Interfering Components: Attention EffectiveTechniques: Extra processing time Visual Recognition/Discrimination Discrimination: Not tested Reading Comprehension Reading Status: (TBA)    Expression Expression Primary Mode of Expression: Verbal Verbal Expression Overall Verbal Expression: Impaired Initiation: Impaired Automatic Speech: Counting(impaired) Level of Generative/Spontaneous Verbalization: Word Repetition: Impaired Level of Impairment: Phrase level Naming: (TBA) Pragmatics: Impairment Impairments: Eye contact Interfering Components: Attention Written Expression Dominant Hand: Left Written Expression: Not tested(TBA)   Oral / Motor  Oral Motor/Sensory Function Overall Oral Motor/Sensory Function: Moderate impairment Facial ROM: Reduced left;Suspected CN VII (facial) dysfunction Facial Symmetry: Abnormal symmetry left;Suspected CN VII (facial) dysfunction Facial Strength: Reduced left;Suspected CN VII (facial) dysfunction Lingual Symmetry: Abnormal symmetry  left;Suspected CN XII (hypoglossal) dysfunction Motor Speech Overall Motor Speech: Impaired Respiration: Impaired Level of Impairment: Phrase Phonation: Low vocal intensity Resonance: Within functional limits Intelligibility: Intelligibility reduced Word: 75-100% accurate Phrase: 25-49% accurate Motor Planning: Witnin functional limits   GO                    Royce Macadamia 01/25/2018, 3:02 PM  Breck Coons Gayna Braddy M.Ed [employer] [phone]

## 2018-01-25 NOTE — Evaluation (Deleted)
Speech Language Pathology Evaluation Patient Details Name: Nicholas Mejia MRN: [id number] DOB: January 22, 1971 Today's Date: 01/25/2018 Time: [id number] SLP Time Calculation (min) (ACUTE ONLY): 21 min  Problem List:  Patient Active Problem List   Diagnosis Date Noted  . Cerebral thrombosis with cerebral infarction 01/24/2018  . S/P CABG x 4 01/31/2018  . Ischemic cardiomyopathy   . Abnormal stress test 12/21/2017  . Nausea & vomiting   . Indigestion   . Gastroesophageal reflux disease   . Uncontrolled type 2 diabetes mellitus with complication, without long-term current use of insulin (HCC)   . Non-intractable vomiting with nausea   . Stage 2 chronic kidney disease   . Ataxia, post-stroke   . Stage 3 chronic kidney disease (HCC)   . Hyponatremia   . Type II diabetes mellitus, uncontrolled (HCC)   . Benign essential HTN   . Mixed hyperlipidemia   . 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 Diabetes mellitus without complication (HCC) 12/31/2016  . AKI (acute kidney injury) (HCC) 12/31/2016  . Leukocytosis 12/31/2016  . Essential hypertension 12/31/2016   Past Medical History:  Past Medical History:  Diagnosis Date  . CAD (coronary artery disease)   . Complication of anesthesia   . Diabetes mellitus without complication (HCC)   . Dyspnea    on exertion  . GERD (gastroesophageal reflux disease)   . Hypertension   . Ischemic cardiomyopathy   . Morbid obesity (HCC)   . Pneumonia   . Sleep apnea    pt. has not received his cpap yet  . Stroke [facility](HCC) 12/30/2016   Past Surgical History:  Past Surgical History:  Procedure Laterality Date  . CORONARY ARTERY BYPASS GRAFT N/A 02/01/2018   Procedure: CORONARY ARTERY BYPASS GRAFTING (CABG) TIMES Four,  USING LEFT INTERNAL MAMMARY ARTERY, RIGHT RADIAL ARTERY AND RIGHT SAPHENOUS LEG VEIN HARVESTED ENDOSCOPICALLY;  Surgeon: Loreli SlotHendrickson, Steven C, MD;  [facility];  Service:  Open Heart Surgery;  Laterality: N/A;  . EXTERNAL EAR SURGERY  1995   right ear -removed keloid  . LEFT HEART CATH AND CORONARY ANGIOGRAPHY N/A 12/22/2017   Procedure: LEFT HEART CATH AND CORONARY ANGIOGRAPHY;  Surgeon: Elder NegusPatwardhan, Manish J, MD;  [facility] LAB;  Service: Cardiovascular;  Laterality: N/A;  . RADIAL ARTERY HARVEST Right 02/15/2018   Procedure: RADIAL ARTERY HARVEST;  Surgeon: Loreli SlotHendrickson, Steven C, MD;  [facility];  Service: Open Heart Surgery;  Laterality: Right;  . TEE WITHOUT CARDIOVERSION N/A 02/04/2018   Procedure: TRANSESOPHAGEAL ECHOCARDIOGRAM (TEE);  Surgeon: Loreli SlotHendrickson, Steven C, MD;  [facility];  Service: Open Heart Surgery;  Laterality: N/A;   HPI:  Nicholas SparWarren Mejia is an 47 y.o. male with PMH of CAD, Diabetes Mellitus, HTN, Morbid obesity, OSA. Admitted for CABG. Patient underwent CABG procedure on 3.1.19 and has remained lethargic since then. Pt noted to not have been moving left side and Neurology was consulted for concern of stroke. CT head showed new patchy hypodensities in the right corona radiata and right caudate nucleus consistent with acute infarcts. CTA showed severe R m1 stenosis.  Intubated for procedure only   Assessment / Plan / Recommendation Clinical Impression  Pt with increased lethargy  today, per fiance requiring verbal and tactile stimulation thoroughout. Assessement limited and will be ongoing due to lethargy but demonstrated dysarthria, language impairment with suspected right brain dominant for language considering location of stroke and deficits. Difficulty repeating simple phrases, followed one step commands  with 50-60% accuracy and biographical responses less than 50%. Brief education initiated with fiance and strategies for language facilitation. Pt will need intense ST for intervention of communication-cognitive abilities.       SLP Assessment  SLP Recommendation/Assessment: Patient needs continued Speech Lanaguage Pathology  Services SLP Visit Diagnosis: Attention and concentration deficit;Cognitive communication deficit (R41.841) Attention and concentration deficit following: Cerebral infarction    Follow Up Recommendations  Inpatient Rehab    Frequency and Duration min 2x/week  2 weeks      SLP Evaluation Cognition  Overall Cognitive Status: Impaired/Different from baseline Arousal/Alertness: Lethargic Orientation Level: Oriented to person;Disoriented to place;Disoriented to time;Disoriented to situation Attention: Sustained Sustained Attention: Impaired Sustained Attention Impairment: Verbal basic Memory: (TBA) Awareness: Impaired Awareness Impairment: Intellectual impairment;Emergent impairment;Anticipatory impairment Problem Solving: (TBA further) Safety/Judgment: Impaired       Comprehension  Auditory Comprehension Overall Auditory Comprehension: Impaired Yes/No Questions: Impaired Basic Biographical Questions: 26-50% accurate Commands: Impaired One Step Basic Commands: 50-74% accurate Interfering Components: Attention EffectiveTechniques: Extra processing time Visual Recognition/Discrimination Discrimination: Not tested Reading Comprehension Reading Status: (TBA)    Expression Expression Primary Mode of Expression: Verbal Verbal Expression Overall Verbal Expression: Impaired Initiation: Impaired Automatic Speech: Counting(impaired) Level of Generative/Spontaneous Verbalization: Word Repetition: Impaired Level of Impairment: Phrase level Naming: (TBA) Pragmatics: Impairment Impairments: Eye contact Interfering Components: Attention Written Expression Dominant Hand: Left Written Expression: Not tested(TBA)   Oral / Motor  Oral Motor/Sensory Function Overall Oral Motor/Sensory Function: Moderate impairment Facial ROM: Reduced left;Suspected CN VII (facial) dysfunction Facial Symmetry: Abnormal symmetry left;Suspected CN VII (facial) dysfunction Facial Strength: Reduced  left;Suspected CN VII (facial) dysfunction Lingual Symmetry: Abnormal symmetry left;Suspected CN XII (hypoglossal) dysfunction Motor Speech Overall Motor Speech: Impaired Respiration: Impaired Level of Impairment: Phrase Phonation: Low vocal intensity Resonance: Within functional limits Intelligibility: Intelligibility reduced Word: 75-100% accurate Phrase: 25-49% accurate Motor Planning: Witnin functional limits   GO                    Nicholas Mejia 01/25/2018, 3:03 PM   Nicholas Mejia Nicholas Mejia M.Ed [employer] [phone]

## 2018-01-25 NOTE — Progress Notes (Signed)
Cortrak Tube Team Note:  Consult received to place a Cortrak feeding tube.   A 10 F Cortrak tube was placed in the right nare and secured with a nasal bridle at 78 cm. Per the Cortrak monitor reading the tube tip is gastric.   No x-ray is required. RN may begin using tube.   If the tube becomes dislodged please keep the tube and contact the Cortrak team at [email or web address] (password [email or web address]) for replacement.  If after hours and replacement cannot be delayed, place a NG tube and confirm placement with an abdominal x-ray.   Vanessa Kickarly Prayan Ulin RD, LDN Clinical Nutrition Pager # [phone]

## 2018-01-26 DIAGNOSIS — G8194 Hemiplegia, unspecified affecting left nondominant side: Secondary | ICD-10-CM

## 2018-01-26 DIAGNOSIS — I6330 Cerebral infarction due to thrombosis of unspecified cerebral artery: Secondary | ICD-10-CM

## 2018-01-26 LAB — COMPREHENSIVE METABOLIC PANEL
ALBUMIN: 2.8 g/dL — AB (ref 3.5–5.0)
ALK PHOS: 44 U/L (ref 38–126)
ALT: 15 U/L — ABNORMAL LOW (ref 17–63)
ANION GAP: 8 (ref 5–15)
AST: 17 U/L (ref 15–41)
BILIRUBIN TOTAL: 0.6 mg/dL (ref 0.3–1.2)
BUN: 38 mg/dL — AB (ref 6–20)
CALCIUM: 8.3 mg/dL — AB (ref 8.9–10.3)
CO2: 27 mmol/L (ref 22–32)
Chloride: 109 mmol/L (ref 101–111)
Creatinine, Ser: 1.45 mg/dL — ABNORMAL HIGH (ref 0.61–1.24)
GFR calc Af Amer: >60 mL/min (ref >60)
GFR, EST NON AFRICAN AMERICAN: 56 mL/min — AB (ref >60)
GLUCOSE: 123 mg/dL — AB (ref 65–99)
POTASSIUM: 4.2 mmol/L (ref 3.5–5.1)
Sodium: 144 mmol/L (ref 135–145)
TOTAL PROTEIN: 6.3 g/dL — AB (ref 6.5–8.1)

## 2018-01-26 LAB — PREPARE RBC (CROSSMATCH)

## 2018-01-26 LAB — POCT I-STAT 3, ART BLOOD GAS (G3+)
BICARBONATE: 25.8 mmol/L (ref 20.0–28.0)
O2 Saturation: 96 %
PH ART: 7.345 — AB (ref 7.350–7.450)
TCO2: 27 mmol/L (ref 22–32)
pCO2 arterial: 47.4 mmHg (ref 32.0–48.0)
pO2, Arterial: 88 mmHg (ref 83.0–108.0)

## 2018-01-26 LAB — GLUCOSE, CAPILLARY
GLUCOSE-CAPILLARY: 152 mg/dL — AB (ref 65–99)
GLUCOSE-CAPILLARY: 125 mg/dL — AB (ref 65–99)
Glucose-Capillary: 112 mg/dL — ABNORMAL HIGH (ref 65–99)
Glucose-Capillary: 115 mg/dL — ABNORMAL HIGH (ref 65–99)
Glucose-Capillary: 147 mg/dL — ABNORMAL HIGH (ref 65–99)
Glucose-Capillary: 155 mg/dL — ABNORMAL HIGH (ref 65–99)

## 2018-01-26 LAB — CBC
HEMATOCRIT: 21.3 % — AB (ref 39.0–52.0)
HEMOGLOBIN: 6.6 g/dL — AB (ref 13.0–17.0)
MCH: 28.9 pg (ref 26.0–34.0)
MCHC: 31 g/dL (ref 30.0–36.0)
MCV: 93.4 fL (ref 78.0–100.0)
Platelets: 263 10*3/uL (ref 150–400)
RBC: 2.28 MIL/uL — ABNORMAL LOW (ref 4.22–5.81)
RDW: 15.4 % (ref 11.5–15.5)
WBC: 15.8 10*3/uL — ABNORMAL HIGH (ref 4.0–10.5)

## 2018-01-26 MED ORDER — PRO-STAT SUGAR FREE PO LIQD
30.0000 mL | Freq: Four times a day (QID) | ORAL | Status: DC
  Administered 2018-01-26 – 2018-02-04 (×34): 30 mL
  Filled 2018-01-26 (×25): qty 30

## 2018-01-26 MED ORDER — ASPIRIN 325 MG PO TABS
325.0000 mg | ORAL_TABLET | Freq: Every day | ORAL | Status: DC
  Administered 2018-01-26 – 2018-01-30 (×5): 325 mg via ORAL
  Filled 2018-01-26 (×5): qty 1

## 2018-01-26 MED ORDER — JEVITY 1.2 CAL PO LIQD
1000.0000 mL | ORAL | Status: DC
  Administered 2018-01-26 – 2018-02-03 (×9): 1000 mL
  Filled 2018-01-26 (×20): qty 1000

## 2018-01-26 MED ORDER — SODIUM CHLORIDE 0.9 % IV SOLN
Freq: Once | INTRAVENOUS | Status: AC
  Administered 2018-01-26: 06:00:00 via INTRAVENOUS

## 2018-01-26 MED ORDER — ORAL CARE MOUTH RINSE
15.0000 mL | Freq: Two times a day (BID) | OROMUCOSAL | Status: DC
  Administered 2018-01-26 – 2018-01-31 (×12): 15 mL via OROMUCOSAL

## 2018-01-26 MED ORDER — CHLORHEXIDINE GLUCONATE 0.12 % MT SOLN
15.0000 mL | Freq: Two times a day (BID) | OROMUCOSAL | Status: DC
  Administered 2018-01-26 – 2018-01-31 (×12): 15 mL via OROMUCOSAL
  Filled 2018-01-26 (×7): qty 15

## 2018-01-26 MED FILL — Heparin Sodium (Porcine) Inj 1000 Unit/ML: INTRAMUSCULAR | Qty: 30 | Status: AC

## 2018-01-26 MED FILL — Potassium Chloride Inj 2 mEq/ML: INTRAVENOUS | Qty: 40 | Status: AC

## 2018-01-26 MED FILL — Magnesium Sulfate Inj 50%: INTRAMUSCULAR | Qty: 10 | Status: AC

## 2018-01-26 NOTE — Progress Notes (Signed)
Patient ID: Benetta SparWarren Verdone, male   DOB: 19-Sep-1971, 47 y.o.   MRN: [id number] EVENING ROUNDS NOTE :     [address]       [place] [id number]             [phone]                 4 Days Post-Op Procedure(s) (LRB): CORONARY ARTERY BYPASS GRAFTING (CABG) TIMES Four,  USING LEFT INTERNAL MAMMARY ARTERY, RIGHT RADIAL ARTERY AND RIGHT SAPHENOUS LEG VEIN HARVESTED ENDOSCOPICALLY (N/A) RADIAL ARTERY HARVEST (Right) TRANSESOPHAGEAL ECHOCARDIOGRAM (TEE) (N/A)  Total Length of Stay:  LOS: 4 days  BP 122/82 (BP Location: Left Leg)   Pulse 83   Temp 98.8 F (37.1 C) (Oral)   Resp (!) 8   Ht 6\' 2"  (1.88 m)   Wt (!) 326 lb 4.5 oz (148 kg)   SpO2 97%   BMI 41.89 kg/m   .Intake/Output      03/05 0701 - 03/06 0700   I.V. (mL/kg)    Blood 306   NG/GT 90   Total Intake(mL/kg) 396 (2.7)   Urine (mL/kg/hr) 300 (0.2)   Total Output 300   Net +96         . sodium chloride    . dexmedetomidine (PRECEDEX) IV infusion Stopped (01/23/18 0552)  . feeding supplement (JEVITY 1.2 CAL) 1,000 mL (01/26/18 1240)     Lab Results  Component Value Date   WBC 15.8 (H) 01/26/2018   HGB 6.6 (LL) 01/26/2018   HCT 21.3 (L) 01/26/2018   PLT 263 01/26/2018   GLUCOSE 123 (H) 01/26/2018   CHOL 83 01/24/2018   TRIG 80 01/24/2018   HDL 31 (L) 01/24/2018   LDLCALC 36 01/24/2018   ALT 15 (L) 01/26/2018   AST 17 01/26/2018   NA 144 01/26/2018   K 4.2 01/26/2018   CL 109 01/26/2018   CREATININE 1.45 (H) 01/26/2018   BUN 38 (H) 01/26/2018   CO2 27 01/26/2018   INR 1.33 01/30/2018   HGBA1C 7.2 (H) 01/24/2018   Stable day Not talking    Delight Ovensdward B Samwise Eckardt MD  Beeper [phone] Office [phone] 01/26/2018 7:36 PM

## 2018-01-26 NOTE — Progress Notes (Signed)
Inpatient Rehabilitation  Per OT request, patient was screened by Elner Seifert for appropriateness for an Inpatient Acute Rehab consult.  At this time we are recommending an Inpatient Rehab consult.  Please order if you are agreeable.    Amillion Scobee, M.A., CCC/SLP Admission Coordinator  [facility]  Cell [phone]  

## 2018-01-26 NOTE — Progress Notes (Signed)
CRITICAL VALUE ALERT  Critical Value:  HGB 6.6  Date & Time Notied:  0430 01/26/18  Provider Notified: Laneta SimmersBartle  Orders Received/Actions taken: 2 units PRBC ordered. Also informed of decline in neuro status and slightly unequal pupils. Will continue to monitor closely. Modena JanskyKevin Janeisha Ryle RN 2 Heart

## 2018-01-26 NOTE — Progress Notes (Signed)
4 Days Post-Op Procedure(s) (LRB): CORONARY ARTERY BYPASS GRAFTING (CABG) TIMES Four,  USING LEFT INTERNAL MAMMARY ARTERY, RIGHT RADIAL ARTERY AND RIGHT SAPHENOUS LEG VEIN HARVESTED ENDOSCOPICALLY (N/A) RADIAL ARTERY HARVEST (Right) TRANSESOPHAGEAL ECHOCARDIOGRAM (TEE) (N/A) Subjective: Lethargic this AM  Objective: Vital signs in last 24 hours: Temp:  [99 F (37.2 C)-99.6 F (37.6 C)] 99.4 F (37.4 C) (03/05 0700) Pulse Rate:  [84-108] 86 (03/05 0700) Cardiac Rhythm: Normal sinus rhythm (03/05 0430) Resp:  [11-23] 16 (03/05 0700) BP: (81-148)/(50-99) 111/85 (03/05 0700) SpO2:  [93 %-100 %] 97 % (03/05 0700) Weight:  [326 lb 4.5 oz (148 kg)] 326 lb 4.5 oz (148 kg) (03/05 0432)  Hemodynamic parameters for last 24 hours: CVP:  [7 mmHg-13 mmHg] 9 mmHg  Intake/Output from previous day: 03/04 0701 - 03/05 0700 In: 504 [I.V.:3.2; Blood:320.8; NG/GT:180] Out: 1530 [Urine:1530] Intake/Output this shift: No intake/output data recorded.  General appearance: lethargic Neurologic: left sided weakness Heart: regular rate and rhythm Lungs: clear to auscultation bilaterally Abdomen: normal findings: soft, non-tender Wound: clean and dry, ecchymosis right thigh  Lab Results: Recent Labs    01/25/18 0731 01/26/18 0304  WBC 16.7* 15.8*  HGB 7.6* 6.6*  HCT 24.1* 21.3*  PLT 226 263   BMET:  Recent Labs    01/25/18 0731 01/26/18 0304  NA 141 144  K 4.4 4.2  CL 107 109  CO2 22 27  GLUCOSE 141* 123*  BUN 37* 38*  CREATININE 1.42* 1.45*  CALCIUM 8.2* 8.3*    PT/INR: No results for input(s): LABPROT, INR in the last 72 hours. ABG    Component Value Date/Time   PHART 7.340 (L) 01/23/2018 0846   HCO3 22.2 01/23/2018 0846   TCO2 23 01/23/2018 1521   ACIDBASEDEF 3.0 (H) 01/23/2018 0846   O2SAT 99.0 01/23/2018 0846   CBG (last 3)  Recent Labs    01/25/18 2031 01/25/18 2329 01/26/18 0414  GLUCAP 130* 129* 112*    Assessment/Plan: S/P Procedure(s) (LRB): CORONARY  ARTERY BYPASS GRAFTING (CABG) TIMES Four,  USING LEFT INTERNAL MAMMARY ARTERY, RIGHT RADIAL ARTERY AND RIGHT SAPHENOUS LEG VEIN HARVESTED ENDOSCOPICALLY (N/A) RADIAL ARTERY HARVEST (Right) TRANSESOPHAGEAL ECHOCARDIOGRAM (TEE) (N/A) NEURO- more lethargic this AM  Still with left hemiparesis- continue PT/OT/Speech  Check ABG to r/o Co2 retention as source of lethargy  CV- stable in SR  RESP- check ABG  RENAL- creatinine mildly elevated, stable  ENDO- CBG well controlled  GI/Nutrition- start TF via [facility]   LOS: 4 days    Loreli SlotSteven C Alton Tremblay 01/26/2018

## 2018-01-26 NOTE — Consult Note (Signed)
Physical Medicine and Rehabilitation Consult Reason for Consult: Left side Weakness after CABG Referring Physician: Dr. Dorris Fetch   HPI: Nicholas Mejia is a 47 y.o. 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 Presented 01/26/2018 with increasing shortness of breath. He was diagnosed with congestive heart failure. A nuclear stress test showed a large scar with peri-infarct ischemia and ejection fraction of 40%. Cardiac catheterization at 70% left main stenosis. There was an 80% stenosis and a diffusely diseased right coronary. Underwent CABG 3 01/23/2018 per Dr. Dorris Fetch. Hospital course pain management with sternal precautions. Postoperatively 01/23/2018 with left-sided weakness. CT of the head showed new left PICA infarction. CTA showed severe right M1 stenosis. Echocardiogram 01/25/2018 with ejection fraction of 50% no wall motion abnormalities. EEG negative for seizure. Currently maintained on aspirin and Plavix therapy. Subcutaneous Lovenox for DVT prophylaxis. Patient is NPO with alternative means of nutritional support. Physical therapy evaluation completed with recommendations of physical medicine rehabilitation consult.   Review of Systems  Unable to perform ROS: Acuity of condition   Past Medical History:  Diagnosis Date  . CAD (coronary artery disease)   . Complication of anesthesia   . Diabetes mellitus without complication (HCC)   . Dyspnea    on exertion  . GERD (gastroesophageal reflux disease)   . Hypertension   . Ischemic cardiomyopathy   . Morbid obesity (HCC)   . Pneumonia   . Sleep apnea    pt. has not received his cpap yet  . Stroke [facility]) 12/30/2016   Past Surgical History:  Procedure Laterality Date  . CORONARY ARTERY BYPASS GRAFT N/A 02/03/2018   Procedure:  CORONARY ARTERY BYPASS GRAFTING (CABG) TIMES Four,  USING LEFT INTERNAL MAMMARY ARTERY, RIGHT RADIAL ARTERY AND RIGHT SAPHENOUS LEG VEIN HARVESTED ENDOSCOPICALLY;  Surgeon: Loreli Slot, MD;  Location: [facility] OR;  Service: Open Heart Surgery;  Laterality: N/A;  . EXTERNAL EAR SURGERY  1995   right ear -removed keloid  . LEFT HEART CATH AND CORONARY ANGIOGRAPHY N/A 12/22/2017   Procedure: LEFT HEART CATH AND CORONARY ANGIOGRAPHY;  Surgeon: Elder Negus, MD;  Location: [facility] INVASIVE CV LAB;  Service: Cardiovascular;  Laterality: N/A;  . RADIAL ARTERY HARVEST Right 02/10/2018   Procedure: RADIAL ARTERY HARVEST;  Surgeon: Loreli Slot, MD;  Location: [facility] OR;  Service: Open Heart Surgery;  Laterality: Right;  . TEE WITHOUT CARDIOVERSION N/A 01/25/2018   Procedure: TRANSESOPHAGEAL ECHOCARDIOGRAM (TEE);  Surgeon: Loreli Slot, MD;  Location: [facility] OR;  Service: Open Heart Surgery;  Laterality: N/A;   Family History  Problem Relation Age of Onset  . Heart attack Father   . Gout Brother    Social History:  reports that he quit smoking about 15 months ago. His smoking use included cigars. he has never used smokeless tobacco. He reports that he drinks alcohol. He reports that he uses drugs. Drug: Marijuana. Allergies:  Allergies  Allergen Reactions  . Bee Venom Anaphylaxis and Swelling  . Metoprolol Shortness Of Breath  . Penicillins Swelling    Swelling in feet Has patient had a PCN reaction causing immediate rash, facial/tongue/throat swelling, SOB or lightheadedness with hypotension: No Has patient had a PCN reaction causing severe rash involving mucus membranes or skin necrosis: No Has patient had a PCN reaction that required hospitalization: No Has patient had a PCN reaction occurring within  the last 10 years: No If all of the above answers are "NO", then may proceed with Cephalosporin use.  . Pollen Extract     UNSPECIFIED REACTION    Medications Prior to Admission    Medication Sig Dispense Refill  . aspirin EC 81 MG tablet Take 81 mg by mouth daily.     [name] atorvastatin (LIPITOR) 40 MG tablet Take 40 mg by mouth daily.    . Azilsartan Medoxomil (EDARBI) 80 MG TABS Take 80 mg by mouth daily.    [name] BLACK CURRANT SEED OIL PO Take 5 mLs by mouth daily.    . clopidogrel (PLAVIX) 75 MG tablet Take 75 mg by mouth daily.    . Coenzyme Q10 (CO Q-10) 400 MG CAPS Take 400 mg by mouth daily.     . furosemide (LASIX) 20 MG tablet Take 20 mg by mouth at bedtime.     [name] GARLIC PO Take 3 tablets by mouth daily.    . isosorbide-hydrALAZINE (BIDIL) 20-37.5 MG tablet Take 1 tablet by mouth 3 (three) times daily.    . metFORMIN (GLUCOPHAGE-XR) 500 MG 24 hr tablet Take 500 mg by mouth 2 (two) times daily.    . montelukast (SINGULAIR) 10 MG tablet Take 10 mg by mouth at bedtime as needed (allergies).     [name] OVER THE COUNTER MEDICATION Take 1 tablet by mouth daily. blood sugar control otc supplement    . Probiotic Product (PROBIOTIC DAILY PO) Take 1 capsule by mouth 2 (two) times daily.    . traMADol (ULTRAM) 50 MG tablet Take 150 mg by mouth at bedtime as needed for moderate pain.     . Turmeric 500 MG CAPS Take 500 mg by mouth 2 (two) times daily.     . Vitamin D, Ergocalciferol, (DRISDOL) 50000 units CAPS capsule Take 50,000 Units by mouth 2 (two) times a week.      Home: Home Living Family/patient expects to be discharged to:: Private residence Living Arrangements: Alone Available Help at Discharge: Family, Friend(s), Available PRN/intermittently Type of Home: Apartment Home Access: Stairs to enter [employer] of Steps: 12-14 Entrance Stairs-Rails: Right Home Layout: One level Bathroom Shower/Tub: Engineer, manufacturing systems: Standard Bathroom Accessibility: Yes Home Equipment: None Additional Comments: Will need more information re: home setup and assist; Per notes from last admission, he lives in an apartment with a flight of steps to get in; this  will have to be verified  Functional History: Prior Function Level of Independence: Independent Comments: 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[employer]; likes to travel and go shopping per fiance who lives in [place]  Functional Status:  Mobility: Bed Mobility Overal bed mobility: Needs Assistance Bed Mobility: Rolling, Sidelying to Sit, Sit to Supine Rolling: Total assist, +2 for physical assistance Sidelying to sit: Total assist, +2 for physical assistance Sit to supine: Total assist, +2 for physical assistance General bed mobility comments: Total assist and use of bed pad for all aspects of bed mobility Transfers General transfer comment: Unable today      ADL: ADL Overall ADL's : Needs assistance/impaired Eating/Feeding: NPO Grooming: Maximal assistance Grooming Details (indicate cue type and reason): Able to wipe face General ADL Comments: total A at this time  Cognition: Cognition Overall Cognitive Status: Impaired/Different from baseline Arousal/Alertness: Lethargic Orientation Level: Other (comment)(unable to assess fully. Follows commands at times) Attention: Sustained Sustained Attention: Impaired Sustained Attention Impairment: Verbal basic Memory: (TBA) Awareness: Impaired Awareness Impairment: Intellectual impairment, Emergent impairment, Anticipatory impairment Problem Solving: (TBA  further) Safety/Judgment: Impaired Cognition Arousal/Alertness: Lethargic Behavior During Therapy: Flat affect Overall Cognitive Status: Impaired/Different from baseline Area of Impairment: Attention, Following commands, Awareness, Problem solving, Safety/judgement Current Attention Level: Sustained Following Commands: Follows one step commands inconsistently General Comments: very lethargic. Difficult to assess  Blood pressure 140/77, pulse 87, temperature 98.8 F (37.1 C), temperature source Oral, resp. rate 20, height 6\' 2"  (1.88 m), weight (!) 148 kg (326 lb 4.5 oz), SpO2 98  %. Physical Exam  Vitals reviewed. Constitutional:  47 year old right-handed obese male  Eyes:  Pupils sluggish to light  Neck: Normal range of motion. Neck supple. No thyromegaly present.  Cardiovascular:  Cardiac rate controlled  Respiratory:  Limited inspiratory effort  GI: Soft. Bowel sounds are normal. He exhibits no distension.  Neurological:  Lethargic. Exam overall limited due to lethargy. Inconsistent yes and no head nods. He remained nonverbal throughout exam. Patient initiates movement with his right arm and leg.  I saw no voluntary movement left arm or leg.  Did not withdraw to pain on the left either.  Right facial weakness noted.  Skin:  Midline chest incision clean and dry    Results for orders placed or performed during the hospital encounter of 01-31-2018 (from the past 24 hour(s))  Glucose, capillary     Status: Abnormal   Collection Time: 01/25/18  4:21 PM  Result Value Ref Range   Glucose-Capillary 128 (H) 65 - 99 mg/dL   Comment 1 Capillary Specimen   Glucose, capillary     Status: Abnormal   Collection Time: 01/25/18  8:31 PM  Result Value Ref Range   Glucose-Capillary 130 (H) 65 - 99 mg/dL   Comment 1 Capillary Specimen    Comment 2 Notify RN   Glucose, capillary     Status: Abnormal   Collection Time: 01/25/18 11:29 PM  Result Value Ref Range   Glucose-Capillary 129 (H) 65 - 99 mg/dL   Comment 1 Capillary Specimen    Comment 2 Notify RN   CBC     Status: Abnormal   Collection Time: 01/26/18  3:04 AM  Result Value Ref Range   WBC 15.8 (H) 4.0 - 10.5 K/uL   RBC 2.28 (L) 4.22 - 5.81 MIL/uL   Hemoglobin 6.6 (LL) 13.0 - 17.0 g/dL   HCT 16.1 (L) 09.6 - 04.5 %   MCV 93.4 78.0 - 100.0 fL   MCH 28.9 26.0 - 34.0 pg   MCHC 31.0 30.0 - 36.0 g/dL   RDW 40.9 81.1 - 91.4 %   Platelets 263 150 - 400 K/uL  Comprehensive metabolic panel     Status: Abnormal   Collection Time: 01/26/18  3:04 AM  Result Value Ref Range   Sodium 144 135 - 145 mmol/L   Potassium  4.2 3.5 - 5.1 mmol/L   Chloride 109 101 - 111 mmol/L   CO2 27 22 - 32 mmol/L   Glucose, Bld 123 (H) 65 - 99 mg/dL   BUN 38 (H) 6 - 20 mg/dL   Creatinine, Ser 7.82 (H) 0.61 - 1.24 mg/dL   Calcium 8.3 (L) 8.9 - 10.3 mg/dL   Total Protein 6.3 (L) 6.5 - 8.1 g/dL   Albumin 2.8 (L) 3.5 - 5.0 g/dL   AST 17 15 - 41 U/L   ALT 15 (L) 17 - 63 U/L   Alkaline Phosphatase 44 38 - 126 U/L   Total Bilirubin 0.6 0.3 - 1.2 mg/dL   GFR calc non Af Amer 56 (L) >60 mL/min  GFR calc Af Amer >60 >60 mL/min   Anion gap 8 5 - 15  Glucose, capillary     Status: Abnormal   Collection Time: 01/26/18  4:14 AM  Result Value Ref Range   Glucose-Capillary 112 (H) 65 - 99 mg/dL  Prepare RBC     Status: None   Collection Time: 01/26/18  4:33 AM  Result Value Ref Range   Order Confirmation      ORDER PROCESSED BY BLOOD BANK Performed at [facility], [address], [place], [place] [address]   Glucose, capillary     Status: Abnormal   Collection Time: 01/26/18  8:19 AM  Result Value Ref Range   Glucose-Capillary 115 (H) 65 - 99 mg/dL  I-STAT 3, arterial blood gas (G3+)     Status: Abnormal   Collection Time: 01/26/18 10:48 AM  Result Value Ref Range   pH, Arterial 7.345 (L) 7.350 - 7.450   pCO2 arterial 47.4 32.0 - 48.0 mmHg   pO2, Arterial 88.0 83.0 - 108.0 mmHg   Bicarbonate 25.8 20.0 - 28.0 mmol/L   TCO2 27 22 - 32 mmol/L   O2 Saturation 96.0 %   Patient temperature 37.1 C    Sample type ARTERIAL    Ct Head Wo Contrast  Addendum Date: 01/25/2018   ADDENDUM REPORT: 01/25/2018 08:43 ADDENDUM: Original report by Dr. Chase Picket. Addendum by Dr. Mosetta Putt: The study was reviewed via telephone with Dr. Pearlean Brownie on 01/25/2018 at 8:37 a.m. The patient has left hemiparesis, and there are new patchy hypodensities in the right corona radiata and right caudate nucleus consistent with acute infarcts. Electronically Signed   By: Sebastian Ache M.D.   On: 01/25/2018 08:43   Result Date: 01/25/2018 CLINICAL DATA:  Altered  mental status EXAM: CT HEAD WITHOUT CONTRAST TECHNIQUE: Contiguous axial images were obtained from the base of the skull through the vertex without intravenous contrast. COMPARISON:  01/23/2018 FINDINGS: Brain: Slight progression of cytotoxic edema at the left cerebellar infarct site. There is periventricular hypoattenuation compatible with chronic microvascular disease. No hemorrhage. Vascular: No hyperdense vessel or unexpected vascular calcification. Skull: Normal visualized skull base, calvarium and extracranial soft tissues. Sinuses/Orbits: No sinus fluid levels or advanced mucosal thickening. No mastoid effusion. Normal orbits. IMPRESSION: Slight progression of cytotoxic edema at the left cerebellar infarct site. No hemorrhage or other new acute finding. Electronically Signed: By: Deatra Robinson M.D. On: 01/25/2018 06:20   Dg Chest Port 1 View  Result Date: 01/25/2018 CLINICAL DATA:  Status post CABG on 02-13-18 EXAM: PORTABLE CHEST 1 VIEW COMPARISON:  Portable chest x-ray of January 24, 2018 FINDINGS: The lungs are borderline hypoinflated. There is persistent increased density at the left base with obscuration of the hemidiaphragm. The cardiac silhouette remains enlarged. The pulmonary vascularity is mildly prominent. The sternal wires are intact. The left-sided PICC line tip projects over the midportion of the SVC. IMPRESSION: Persistent mild CHF. Small left pleural effusion and mild left basilar atelectasis. No pneumothorax. Electronically Signed   By: David  [place] M.D.   On: 01/25/2018 07:17    Assessment/Plan: Diagnosis: Right coronary  radiata and right caudate nucleus infarcts after CABG with resulting left hemiparesis 1. Does the need for close, 24 hr/day medical supervision in concert with the patient's rehab needs make it unreasonable for this patient to be served in a less intensive setting? Yes and Potentially 2. Co-Morbidities requiring supervision/potential complications: cad, postop  considerations, significant lethargy, nutritional considerations, history of prior CVA 3. Due  to bladder management, bowel management, safety, skin/wound care, disease management, medication administration, pain management and patient education, does the patient require 24 hr/day rehab nursing? Yes 4. Does the patient require coordinated care of a physician, rehab nurse, PT (1-2 hrs/day, 5 days/week), OT (1-2 hrs/day, 5 days/week) and SLP (1-2 hrs/day, 5 days/week) to address physical and functional deficits in the context of the above medical diagnosis(es)? Yes and Potentially Addressing deficits in the following areas: balance, endurance, locomotion, strength, transferring, bowel/bladder control, bathing, dressing, feeding, grooming, toileting, cognition, speech, swallowing and psychosocial support 5. Can the patient actively participate in an intensive therapy program of at least 3 hrs of therapy per day at least 5 days per week? Potentially 6. The potential for patient to make measurable gains while on inpatient rehab is good 7. Anticipated functional outcomes upon discharge from inpatient rehab are min assist and mod assist  with PT, min assist and mod assist with OT, supervision and min assist with SLP. 8. Estimated rehab length of stay to reach the above functional goals is: 20-30 days 9. Anticipated D/C setting: Home 10. Anticipated post D/C treatments: HH therapy and Outpatient therapy 11. Overall Rehab/Functional Prognosis: excellent  RECOMMENDATIONS: This patient's condition is appropriate for continued rehabilitative care in the following setting: CIR Patient has agreed to participate in recommended program. N/A Note that insurance prior authorization may be required for reimbursement for recommended care.  Comment: Spoke with family who was present.  Patient was active and working prior to admission.  Still too lethargic to participate in our program but will follow for increased level  of arousal and activity with therapy.  Thanks,  Ranelle OysterZachary T. Swartz, MD, [facility]APMR [facility] Physical Medicine & Rehabilitation 01/26/2018    Mcarthur Rossettianiel J Angiulli, PA-C 01/26/2018

## 2018-01-26 NOTE — Progress Notes (Signed)
Physical Therapy Treatment Patient Details Name: Nicholas Mejia MRN: [id number] DOB: 1971-07-16 Today's Date: 01/26/2018    History of Present Illness [name] is a 47 year old man with a past medical history significant for stroke, type 2 diabetes without complication, hypertension, morbid obesity, coronary artery disease, and ischemic cardiomyopathy. Now S/p CABG x4;  Postop Imaging reveals Parenchymal hypodensity within the inferior left cerebellar hemisphere consistent with acute ischemic infarct, left PICA territory, R corona radiata and R caudate nucleus.  Remote R MCA infarct as well - [facility]. He was recently diagnosed with sleep apnea.     PT Comments    Pt performed OOB to chair via maxisky lift as he is unable to progress in a safe manner otherwise.  Pt will require vital Go tilt bed for progression of mobility involving weight bearing to B LEs.   Pt responded minimally to music during session.  Plan next session for tilt bed therapy.   Follow Up Recommendations  Other (comment);No PT follow up(Post acute rehab. )     Equipment Recommendations  Other (comment)(TBD at next venue)    Recommendations for Other Services       Precautions / Restrictions Precautions Precautions: Sternal;Fall Restrictions Weight Bearing Restrictions: Yes(sternal precautions) Other Position/Activity Restrictions: Sternal precautions    Mobility  Bed Mobility Overal bed mobility: Needs Assistance Bed Mobility: Rolling           General bed mobility comments: Performed rolling to R and L to place maxisky sling.  Pt performed rolling to the L with Max +2, rolling to the R was total assistance +2  Transfers Overall transfer level: Needs assistance               General transfer comment: PTA and PT used maxisky sling to achieve sitting in chair.  once in chair patient able to tap R foot to music playing on cell phone.  Pt limited in activity from chair position  based on deficits.    Ambulation/Gait                 Stairs            Wheelchair Mobility    Modified Rankin (Stroke Patients Only)       Balance                                            Cognition Arousal/Alertness: Lethargic;Suspect due to medications(more alert as tx progressed.  )   Overall Cognitive Status: Impaired/Different from baseline Area of Impairment: Attention;Following commands;Awareness;Problem solving;Safety/judgement                   Current Attention Level: Sustained   Following Commands: Follows one step commands inconsistently     Problem Solving: Slow processing;Decreased initiation;Requires verbal cues;Requires tactile cues General Comments: pt able to follow commands to hold objects in room.  Pt did scroll with cell phone to pick a song on therapists phone.  Pt only able to use R hand during session.        Exercises      General Comments        Pertinent Vitals/Pain Pain Assessment: (P) Faces Faces Pain Scale: No hurt    Home Living                      Prior  Function            PT Goals (current goals can now be found in the care plan section) Acute Rehab PT Goals Patient Stated Goal: unable to formulate goals.   Potential to Achieve Goals: Fair Progress towards PT goals: Progressing toward goals    Frequency    Min 3X/week      PT Plan Current plan remains appropriate    Co-evaluation PT/OT/SLP Co-Evaluation/Treatment: Yes Reason for Co-Treatment: Complexity of the patient's impairments (multi-system involvement);Necessary to address cognition/behavior during functional activity;For patient/therapist safety;To address functional/ADL transfers PT goals addressed during session: Mobility/safety with mobility OT goals addressed during session: ADL's and self-care      AM-PAC PT "6 Clicks" Daily Activity  Outcome Measure  Difficulty turning over in bed (including  adjusting bedclothes, sheets and blankets)?: Unable Difficulty moving from lying on back to sitting on the side of the bed? : Unable Difficulty sitting down on and standing up from a chair with arms (e.g., wheelchair, bedside commode, etc,.)?: Unable Help needed moving to and from a bed to chair (including a wheelchair)?: Total Help needed walking in hospital room?: Total Help needed climbing 3-5 steps with a railing? : Total 6 Click Score: 6    End of Session   Activity Tolerance: Patient limited by fatigue;Patient limited by lethargy Patient left: in bed;with call bell/phone within reach;with nursing/sitter in room;with family/visitor present Nurse Communication: Mobility status(informed nursing of patient responding to music in chair, informed nursing of drainage from sternal incision.  ) PT Visit Diagnosis: Hemiplegia and hemiparesis;Other (comment)(decr functional capacity.  ) Hemiplegia - Right/Left: Left Hemiplegia - dominant/non-dominant: Non-dominant Hemiplegia - caused by: Cerebral infarction     Time: 1001-1039 PT Time Calculation (min) (ACUTE ONLY): 38 min  Charges:  $Therapeutic Activity: 8-22 mins                    G CodesJoycelyn Rua, PTA pager [phone]    Florestine Avers 01/26/2018, 1:16 PM

## 2018-01-26 NOTE — Progress Notes (Signed)
Patient not yet at a level to participate in the intensity of an inpt rehab admit. I will follow for increased ability to participate. Please refer to Dr. Rosalyn ChartersSwartz's consult. [phone]

## 2018-01-26 NOTE — Progress Notes (Addendum)
Initial Nutrition Assessment  DOCUMENTATION CODES:   Morbid obesity  INTERVENTION:   Recommend checking Phosphorus.  Jevity 1.2 @ 3770mL/hr x 24 hours with prostat QID; this provides 2416kcal, 153g protein, and 1361mL free water.  NUTRITION DIAGNOSIS:   Inadequate oral intake related to inability to eat as evidenced by NPO status.  GOAL:   Patient will meet greater than or equal to 90% of their needs  MONITOR:   Diet advancement, Skin, Weight trends, I & O's, Labs  REASON FOR ASSESSMENT:   Consult Enteral/tube feeding initiation and management  ASSESSMENT:   47 y.o.malewith PMH of CAD, DM, and HTN. S/P CABG x4 and perioperative CVA.  3/1 CABG x4; pt extubated 3/4 Cortrak, gastric placement  NPO day #4.   At time of visit pt was lethargic. SLP following but unable to advance diet.   Unable to obtain diet or wt hx from pt due to lethargy; no family at bedside. Reviewed previous wt encounters but no significant changes noted.   Medications: Insulin, protonix.   Labs: BUN (H; 38), Cr (H; 1.45), Corrected Ca WNL, albumin (L; 2.8) 3/2 Mg (H;2.5) Lab Results  Component Value Date   HGBA1C 7.2 (H) 01/24/2018    CBG (last 3)  Recent Labs    01/26/18 0819 01/26/18 1233 01/26/18 1611  GLUCAP 115* 152* 147*    NUTRITION - FOCUSED PHYSICAL EXAM:  No fat/muscle depletions or micronutrient depletions noted.   Diet Order:  Diet NPO time specified Fall precautions  EDUCATION NEEDS:   Not appropriate for education at this time  Skin:  Skin Assessment: Skin Integrity Issues: Skin Integrity Issues:: Incisions Incisions: R arm, R leg, chest  Last BM:  3/4  Height:   Ht Readings from Last 1 Encounters:  02/10/2018 6\' 2"  (1.88 m)    Weight:   Filed Weights   01/24/18 0300 01/25/18 0500 01/26/18 0432  Weight: (!) 335 lb (152 kg) (!) 327 lb 9.7 oz (148.6 kg) (!) 326 lb 4.5 oz (148 kg)   Admit wt on 3/1 : 336lb Post CABG wt on 3/2 : 337lb  11.9oz  Lasix: 40mg  one time dos, 3/2 and 3/3. Net I&O since admit: -1399mL    Ideal Body Weight:  86.36 kg  BMI:  Body mass index is 41.89 kg/m.  Estimated Nutritional Needs:   Kcal:  2200-2400  Protein:  148-163g  Fluid:  2.2-2.4L    Rajanae Mantia, MS, Dietetic Intern Pager # [phone]

## 2018-01-26 NOTE — Progress Notes (Signed)
OT Treatment Note  Pt seen as cotreat with PT. Used Maxisky to move pt to chair. Pt with increased arousal once upright in chair. Pt with apparent significant sensorimotor impairment, L sided weakness and communication impairment.  Nonverbal during session. Pt did respond to music and held cell phone with R hand and scrolled through songs. Feel pt is good candidate for Vital lift bed. Will continue to monitor progress to help determine if CIR will be an appropriate venue. VSS throughout session.    01/26/18 1310  OT Visit Information  Last OT Received On 01/26/18  Assistance Needed +3 or more  PT/OT/SLP Co-Evaluation/Treatment Yes  Reason for Co-Treatment Complexity of the patient's impairments (multi-system involvement);For patient/therapist safety;To address functional/ADL transfers  OT goals addressed during session ADL's and self-care  History of Present Illness Nicholas Mejia is a 47 year old man with a past medical history significant for stroke, type 2 diabetes without complication, hypertension, morbid obesity, coronary artery disease, and ischemic cardiomyopathy. Now S/p CABG x4;  Postop Imaging reveals Parenchymal hypodensity within the inferior left cerebellar hemisphere consistent with acute ischemic infarct, left PICA territory, R corona radiata and R caudate nucleus.  Remote R MCA infarct as well - underwent rehab at [facility] in Feb 2018. He was recently diagnosed with sleep apnea.   Precautions  Precautions Sternal;Fall  Pain Assessment  Pain Assessment Faces  Faces Pain Scale 0  Cognition  Arousal/Alertness Lethargic  Behavior During Therapy Flat affect  Overall Cognitive Status Impaired/Different from baseline  Area of Impairment Attention;Following commands;Awareness;Problem solving  Current Attention Level Focused  Following Commands Follows one step commands inconsistently  Awareness Intellectual  Problem Solving Slow processing;Decreased initiation;Difficulty sequencing;Requires  verbal cues;Requires tactile cues  General Comments Pt with increased arousal in upright posture; Pt not following commands for self care; Pt with increased resonsivenes to music - tapping his R foot/hand to bet; Pt held cell phone and scrolled thorugh songs adn attempted to select  Difficult to assess due to Level of arousal;Impaired communication  ADL  Overall ADL's  Needs assistance/impaired  Functional mobility during ADLs (Maxisky used to mobilize OOB to chair)  General ADL Comments total A at this time  Bed Mobility  Overal bed mobility Needs Assistance  Bed Mobility Rolling  Rolling +2 for physical assistance;Total assist  General bed mobility comments Performed rolling to R and L to place maxisky sling.  Pt performed rolling to the L with Max +2, rolling to the R was total assistance +2  Balance  Sitting balance-Leahy Scale Zero  Restrictions  Other Position/Activity Restrictions Sternal precautions  Vision- Assessment  Vision Assessment? Vision impaired- to be further tested in functional context  Transfers  Overall transfer level Needs assistance  Transfer via Lift Equipment Maxisky  OT - End of Session  Equipment Utilized During Treatment Oxygen  Activity Tolerance Patient limited by lethargy  Patient left in chair;with call bell/phone within reach;with chair alarm set  Nurse Communication Mobility status;Need for lift equipment Nicholas Mejia)  OT Assessment/Plan  OT Plan Discharge plan remains appropriate;Frequency needs to be updated  OT Visit Diagnosis Other abnormalities of gait and mobility (R26.89);Muscle weakness (generalized) (M62.81);Low vision, both eyes (H54.2);Other symptoms and signs involving cognitive function;Hemiplegia and hemiparesis  Hemiplegia - Right/Left Left  Hemiplegia - dominant/non-dominant Dominant  Hemiplegia - caused by Cerebral infarction  OT Frequency (ACUTE ONLY) Min 3X/week  Recommendations for Other Services Rehab consult  Follow Up  Recommendations Supervision/Assistance - 24 hour;CIR  OT Equipment Other (comment) (TBA)  AM-PAC OT "6 Clicks"  Daily Activity Outcome Measure  Help from another person eating meals? 1  Help from another person taking care of personal grooming? 1  Help from another person toileting, which includes using toliet, bedpan, or urinal? 1  Help from another person bathing (including washing, rinsing, drying)? 1  Help from another person to put on and taking off regular upper body clothing? 1  Help from another person to put on and taking off regular lower body clothing? 1  6 Click Score 6  ADL G Code Conversion CN  OT Goal Progression  Progress towards OT goals Progressing toward goals  Acute Rehab OT Goals  Patient Stated Goal none stated  OT Goal Formulation With family  Time For Goal Achievement 02/08/18  Potential to Achieve Goals Fair  ADL Goals  Pt Will Perform Grooming with min assist;bed level  Pt Will Perform Upper Body Bathing with min assist;bed level  Additional ADL Goal #1 Pt will follow 1 step commands with 50% accuracy during treatment sesison  Additional ADL Goal #2 Pt will maintain midline postural control EOB with mod A in preparation for ADL  OT Time Calculation  OT Start Time (ACUTE ONLY) 1001  OT Stop Time (ACUTE ONLY) 1040  OT Time Calculation (min) 39 min  OT General Charges  $OT Visit 1 Visit  OT Treatments  $Therapeutic Activity 23-37 mins  [facility] Hospitalilary Kahmari Koller, OT/L  [phone] 01/26/2018

## 2018-01-26 NOTE — Progress Notes (Signed)
  Speech Language Pathology Treatment: Dysphagia;Cognitive-Linquistic  Patient Details Name: Nicholas Mejia Mastropietro MRN: [id number] DOB: 1970-12-10 Today's Date: 01/26/2018 Time: [id number] SLP Time Calculation (min) (ACUTE ONLY): 11 min  Assessment / Plan / Recommendation Clinical Impression  Pt seen for dysphagia/language treatment. Up in chair following PT/OT session, lethargic requiring frequent verbal and tactile cues. Decreased oral control and cohesion following oral care and ice chip trials with delayed coughs. RN reported he needed significant nasotracheal suctioning earlier today. Unlike yesterday no vocalizations spontaneous or elicited today using singling familiar songs, familiar faces-1st cousin visiting. Nodded head yes to all questions. Continue tx for facilitation of communication and swallow.     HPI HPI: Nicholas Mejia Mishkin is an 47 y.o. male with PMH of CAD, Diabetes Mellitus, HTN, Morbid obesity, OSA. Admitted for CABG. Patient underwent CABG procedure on 3.1.19 and has remained lethargic since then. Pt noted to not have been moving left side and Neurology was consulted for concern of stroke. CT head showed new patchy hypodensities in the right corona radiata and right caudate nucleus consistent with acute infarcts. CTA showed severe R m1 stenosis.  Intubated for procedure only      SLP Plan  Continue with current plan of care       Recommendations  Diet recommendations: NPO Medication Administration: Via alternative means                Oral Care Recommendations: Oral care QID Follow up Recommendations: Inpatient Rehab SLP Visit Diagnosis: Attention and concentration deficit;Cognitive communication deficit (R41.841) Attention and concentration deficit following: Cerebral infarction Plan: Continue with current plan of care                       Royce MacadamiaLitaker, Maryiah Olvey Willis 01/26/2018, 2:20 PM  Breck CoonsLisa Willis Lonell FaceLitaker M.Ed [employer]-SLP Pager [phone]

## 2018-01-27 ENCOUNTER — Inpatient Hospital Stay (HOSPITAL_COMMUNITY): Payer: BLUE CROSS/BLUE SHIELD

## 2018-01-27 LAB — BASIC METABOLIC PANEL
ANION GAP: 10 (ref 5–15)
BUN: 33 mg/dL — ABNORMAL HIGH (ref 6–20)
CHLORIDE: 110 mmol/L (ref 101–111)
CO2: 26 mmol/L (ref 22–32)
CREATININE: 1.15 mg/dL (ref 0.61–1.24)
Calcium: 8.3 mg/dL — ABNORMAL LOW (ref 8.9–10.3)
GFR calc non Af Amer: >60 mL/min (ref >60)
Glucose, Bld: 130 mg/dL — ABNORMAL HIGH (ref 65–99)
POTASSIUM: 4 mmol/L (ref 3.5–5.1)
SODIUM: 146 mmol/L — AB (ref 135–145)

## 2018-01-27 LAB — GLUCOSE, CAPILLARY
GLUCOSE-CAPILLARY: 116 mg/dL — AB (ref 65–99)
GLUCOSE-CAPILLARY: 222 mg/dL — AB (ref 65–99)
Glucose-Capillary: 136 mg/dL — ABNORMAL HIGH (ref 65–99)
Glucose-Capillary: 221 mg/dL — ABNORMAL HIGH (ref 65–99)
Glucose-Capillary: 233 mg/dL — ABNORMAL HIGH (ref 65–99)

## 2018-01-27 LAB — CBC
HEMATOCRIT: 28.8 % — AB (ref 39.0–52.0)
HEMOGLOBIN: 9 g/dL — AB (ref 13.0–17.0)
MCH: 28.8 pg (ref 26.0–34.0)
MCHC: 31.3 g/dL (ref 30.0–36.0)
MCV: 92 fL (ref 78.0–100.0)
Platelets: 345 10*3/uL (ref 150–400)
RBC: 3.13 MIL/uL — AB (ref 4.22–5.81)
RDW: 16.2 % — ABNORMAL HIGH (ref 11.5–15.5)
WBC: 15.9 10*3/uL — ABNORMAL HIGH (ref 4.0–10.5)

## 2018-01-27 MED ORDER — HYDRALAZINE HCL 20 MG/ML IJ SOLN
10.0000 mg | Freq: Once | INTRAMUSCULAR | Status: AC
  Administered 2018-01-27: 10 mg via INTRAVENOUS
  Filled 2018-01-27: qty 1

## 2018-01-27 MED ORDER — TAMSULOSIN HCL 0.4 MG PO CAPS
0.4000 mg | ORAL_CAPSULE | Freq: Every day | ORAL | Status: DC
  Administered 2018-01-27 – 2018-02-12 (×17): 0.4 mg via ORAL
  Filled 2018-01-27 (×18): qty 1

## 2018-01-27 MED ORDER — IRBESARTAN 300 MG PO TABS
300.0000 mg | ORAL_TABLET | Freq: Every day | ORAL | Status: DC
  Administered 2018-01-27 – 2018-01-31 (×5): 300 mg via ORAL
  Filled 2018-01-27 (×6): qty 1

## 2018-01-27 MED ORDER — INSULIN ASPART 100 UNIT/ML ~~LOC~~ SOLN
0.0000 [IU] | Freq: Four times a day (QID) | SUBCUTANEOUS | Status: DC
  Administered 2018-01-27 – 2018-01-28 (×6): 8 [IU] via SUBCUTANEOUS
  Administered 2018-01-29 (×2): 4 [IU] via SUBCUTANEOUS
  Administered 2018-01-29: 2 [IU] via SUBCUTANEOUS
  Administered 2018-01-30: 4 [IU] via SUBCUTANEOUS
  Administered 2018-01-30 (×3): 8 [IU] via SUBCUTANEOUS
  Administered 2018-01-30: 4 [IU] via SUBCUTANEOUS
  Administered 2018-01-31: 8 [IU] via SUBCUTANEOUS
  Administered 2018-01-31: 4 [IU] via SUBCUTANEOUS
  Administered 2018-01-31: 8 [IU] via SUBCUTANEOUS
  Administered 2018-01-31 – 2018-02-01 (×2): 2 [IU] via SUBCUTANEOUS
  Administered 2018-02-01: 8 [IU] via SUBCUTANEOUS
  Administered 2018-02-02 (×2): 4 [IU] via SUBCUTANEOUS
  Administered 2018-02-02: 12 [IU] via SUBCUTANEOUS
  Administered 2018-02-02: 8 [IU] via SUBCUTANEOUS
  Administered 2018-02-03 (×3): 4 [IU] via SUBCUTANEOUS
  Administered 2018-02-03: 8 [IU] via SUBCUTANEOUS
  Administered 2018-02-04: 2 [IU] via SUBCUTANEOUS
  Administered 2018-02-04 (×2): 8 [IU] via SUBCUTANEOUS
  Administered 2018-02-04: 2 [IU] via SUBCUTANEOUS
  Administered 2018-02-05: 8 [IU] via SUBCUTANEOUS

## 2018-01-27 NOTE — Plan of Care (Signed)
Pt continues to follow commands intermittently and continues to not move LUE or LLE to painful stimuli. Pt pupils are currently PERRLA. Pt is not verbalizing words and nods head inappropriately to y/n questions.

## 2018-01-27 NOTE — Plan of Care (Signed)
Pt is being turned Q2 in order to help prevent skin breakdown.

## 2018-01-27 NOTE — Progress Notes (Signed)
5 Days Post-Op Procedure(s) (LRB): CORONARY ARTERY BYPASS GRAFTING (CABG) TIMES Four,  USING LEFT INTERNAL MAMMARY ARTERY, RIGHT RADIAL ARTERY AND RIGHT SAPHENOUS LEG VEIN HARVESTED ENDOSCOPICALLY (N/A) RADIAL ARTERY HARVEST (Right) TRANSESOPHAGEAL ECHOCARDIOGRAM (TEE) (N/A) Subjective: Awake follows simple commands  Objective: Vital signs in last 24 hours: Temp:  [97.2 F (36.2 C)-99.8 F (37.7 C)] 98.6 F (37 C) (03/06 0423) Pulse Rate:  [77-103] 103 (03/06 0700) Cardiac Rhythm: Normal sinus rhythm (03/06 0800) Resp:  [8-24] 21 (03/06 0700) BP: (106-184)/(48-103) 168/103 (03/06 0700) SpO2:  [92 %-100 %] 99 % (03/06 0700) Weight:  [321 lb 14 oz (146 kg)] 321 lb 14 oz (146 kg) (03/06 0401)  Hemodynamic parameters for last 24 hours: CVP:  [0 mmHg-14 mmHg] 3 mmHg  Intake/Output from previous day: 03/05 0701 - 03/06 0700 In: 396 [Blood:306; NG/GT:90] Out: 1500 [Urine:1500] Intake/Output this shift: No intake/output data recorded.  General appearance: cooperative and no distress Neurologic: left sided weakness Heart: regular rate and rhythm Lungs: diminished breath sounds bibasilar Abdomen: normal findings: soft, non-tender Wound: clean and dry  Lab Results: Recent Labs    01/26/18 0304 01/27/18 0408  WBC 15.8* 15.9*  HGB 6.6* 9.0*  HCT 21.3* 28.8*  PLT 263 345   BMET:  Recent Labs    01/26/18 0304 01/27/18 0408  NA 144 146*  K 4.2 4.0  CL 109 110  CO2 27 26  GLUCOSE 123* 130*  BUN 38* 33*  CREATININE 1.45* 1.15  CALCIUM 8.3* 8.3*    PT/INR: No results for input(s): LABPROT, INR in the last 72 hours. ABG    Component Value Date/Time   PHART 7.345 (L) 01/26/2018 1048   HCO3 25.8 01/26/2018 1048   TCO2 27 01/26/2018 1048   ACIDBASEDEF 3.0 (H) 01/23/2018 0846   O2SAT 96.0 01/26/2018 1048   CBG (last 3)  Recent Labs    01/26/18 2321 01/27/18 0339 01/27/18 0756  GLUCAP 125* 116* 136*    Assessment/Plan: S/P Procedure(s) (LRB): CORONARY ARTERY  BYPASS GRAFTING (CABG) TIMES Four,  USING LEFT INTERNAL MAMMARY ARTERY, RIGHT RADIAL ARTERY AND RIGHT SAPHENOUS LEG VEIN HARVESTED ENDOSCOPICALLY (N/A) RADIAL ARTERY HARVEST (Right) TRANSESOPHAGEAL ECHOCARDIOGRAM (TEE) (N/A) -CV- in SR  Hypertension- BP 160-180- want it a little lower- will restart ARB  RESP- LLL atelectasis, pulmonary hygiene limited  RENAL- creatinine back to normal, still edematous  ENDO- CBG OK- change to q6  GI/Nutrition- Feeding tube pulled out- replace  SCD + enoxaparin  NEURO- less lethargic today. Still with left hemiparesis  Continue PT/OT/ Speech   LOS: 5 days    Loreli SlotSteven C Tollie Canada 01/27/2018

## 2018-01-27 NOTE — Progress Notes (Signed)
SLP Cancellation Note  Patient Details Name: Benetta SparWarren Apodaca MRN: [id number] DOB: 10/05/1971   Cancelled treatment:        Pt not ready for FEES and unable to maintain alertness for meal. Will continue intervention for language and swallow.    Royce MacadamiaLitaker, Frankie Scipio Willis 01/27/2018, 2:05 PM  Breck CoonsLisa Willis Lonell FaceLitaker M.Ed [employer]-SLP Pager [phone]

## 2018-01-27 NOTE — Progress Notes (Signed)
CT surgery p.m. Rounds  Patient responsive, resting in bed Sinus rhythm Tube feeds infusing without difficulty Left hemiparesis without change

## 2018-01-27 NOTE — Progress Notes (Signed)
Cortrak Tube Team Note:  Consult received to place a Cortrak feeding tube.   A 10 F Cortrak tube was placed in the R nare and secured with a nasal bridle at 80 cm. Per the Cortrak monitor reading the tube tip is gastric, at the pylorus.   No x-ray is required. RN may begin using tube.   If the tube becomes dislodged please keep the tube and contact the Cortrak team at [email or web address] (password [email or web address]) for replacement.  If after hours and replacement cannot be delayed, place a NG tube and confirm placement with an abdominal x-ray.    Earma ReadingKate Jablonski Madysin Crisp, MS, RD, LDN Pager: [phone] Weekend/After Hours: [phone]

## 2018-01-27 NOTE — Plan of Care (Signed)
  Education: Knowledge of disease or condition will improve 01/27/2018 1719 - Progressing by Loni DollyKelleher, Malikhi Ogan, RN Knowledge of secondary prevention will improve 01/27/2018 1719 - Progressing by Loni DollyKelleher, Montarius Kitagawa, RN Knowledge of patient specific risk factors addressed and post discharge goals established will improve 01/27/2018 1719 - Progressing by Loni DollyKelleher, Mykah Shin, RN   Coping: Will verbalize positive feelings about self 01/27/2018 1719 - Progressing by Loni DollyKelleher, Shenia Alan, RN Will identify appropriate support needs 01/27/2018 1719 - Progressing by Loni DollyKelleher, Dashley Monts, RN   Health Behavior/Discharge Planning: Ability to manage health-related needs will improve 01/27/2018 1719 - Progressing by Loni DollyKelleher, Parag Dorton, RN   Nutrition: Risk of aspiration will decrease 01/27/2018 1719 - Progressing by Loni DollyKelleher, Nashika Coker, RN Dietary intake will improve 01/27/2018 1719 - Progressing by Loni DollyKelleher, Lezley Bedgood, RN

## 2018-01-28 LAB — BPAM RBC
BLOOD PRODUCT EXPIRATION DATE: 201903152359
Blood Product Expiration Date: 201903072359
Blood Product Expiration Date: 201903182359
Blood Product Expiration Date: 201903202359
ISSUE DATE / TIME: 201903030950
ISSUE DATE / TIME: 201903050516
ISSUE DATE / TIME: 201903050804
UNIT TYPE AND RH: 6200
UNIT TYPE AND RH: 6200
Unit Type and Rh: 6200
Unit Type and Rh: 6200

## 2018-01-28 LAB — CBC
HEMATOCRIT: 29.5 % — AB (ref 39.0–52.0)
HEMOGLOBIN: 9.1 g/dL — AB (ref 13.0–17.0)
MCH: 29.3 pg (ref 26.0–34.0)
MCHC: 30.8 g/dL (ref 30.0–36.0)
MCV: 94.9 fL (ref 78.0–100.0)
Platelets: 399 10*3/uL (ref 150–400)
RBC: 3.11 MIL/uL — ABNORMAL LOW (ref 4.22–5.81)
RDW: 16.2 % — AB (ref 11.5–15.5)
WBC: 15.2 10*3/uL — AB (ref 4.0–10.5)

## 2018-01-28 LAB — TYPE AND SCREEN
ABO/RH(D): A POS
Antibody Screen: NEGATIVE
UNIT DIVISION: 0
UNIT DIVISION: 0
Unit division: 0
Unit division: 0

## 2018-01-28 LAB — GLUCOSE, CAPILLARY
GLUCOSE-CAPILLARY: 208 mg/dL — AB (ref 65–99)
Glucose-Capillary: 231 mg/dL — ABNORMAL HIGH (ref 65–99)
Glucose-Capillary: 217 mg/dL — ABNORMAL HIGH (ref 65–99)

## 2018-01-28 LAB — BASIC METABOLIC PANEL
ANION GAP: 8 (ref 5–15)
BUN: 35 mg/dL — ABNORMAL HIGH (ref 6–20)
CALCIUM: 8.4 mg/dL — AB (ref 8.9–10.3)
CHLORIDE: 110 mmol/L (ref 101–111)
CO2: 28 mmol/L (ref 22–32)
Creatinine, Ser: 1.14 mg/dL (ref 0.61–1.24)
GFR calc non Af Amer: >60 mL/min (ref >60)
GLUCOSE: 233 mg/dL — AB (ref 65–99)
Potassium: 4.3 mmol/L (ref 3.5–5.1)
Sodium: 146 mmol/L — ABNORMAL HIGH (ref 135–145)

## 2018-01-28 MED ORDER — INSULIN DETEMIR 100 UNIT/ML ~~LOC~~ SOLN
40.0000 [IU] | Freq: Two times a day (BID) | SUBCUTANEOUS | Status: DC
  Administered 2018-01-28 – 2018-02-02 (×11): 40 [IU] via SUBCUTANEOUS
  Filled 2018-01-28 (×14): qty 0.4

## 2018-01-28 MED FILL — Lidocaine HCl IV Inj 20 MG/ML: INTRAVENOUS | Qty: 5 | Status: AC

## 2018-01-28 MED FILL — Sodium Bicarbonate IV Soln 8.4%: INTRAVENOUS | Qty: 50 | Status: AC

## 2018-01-28 MED FILL — Sodium Chloride IV Soln 0.9%: INTRAVENOUS | Qty: 2000 | Status: AC

## 2018-01-28 MED FILL — Electrolyte-R (PH 7.4) Solution: INTRAVENOUS | Qty: 3000 | Status: AC

## 2018-01-28 MED FILL — Heparin Sodium (Porcine) Inj 1000 Unit/ML: INTRAMUSCULAR | Qty: 10 | Status: AC

## 2018-01-28 MED FILL — Heparin Sodium (Porcine) Inj 1000 Unit/ML: INTRAMUSCULAR | Qty: 30 | Status: AC

## 2018-01-28 MED FILL — Mannitol IV Soln 20%: INTRAVENOUS | Qty: 500 | Status: AC

## 2018-01-28 NOTE — Plan of Care (Signed)
Pt neurologic status is progressing. Pt is able to verbalize his name and intermittently asks for ice water. Pt is still experiencing left sided hemiparesis

## 2018-01-28 NOTE — Progress Notes (Addendum)
TCTS BRIEF SICU PROGRESS NOTE  6 Days Post-Op  S/P Procedure(s) (LRB): CORONARY ARTERY BYPASS GRAFTING (CABG) TIMES Four,  USING LEFT INTERNAL MAMMARY ARTERY, RIGHT RADIAL ARTERY AND RIGHT SAPHENOUS LEG VEIN HARVESTED ENDOSCOPICALLY (N/A) RADIAL ARTERY HARVEST (Right) TRANSESOPHAGEAL ECHOCARDIOGRAM (TEE) (N/A)   Stable day.  Reportedly more verbal today, still not moving left side at all NSR w/ stable BP UOP stable  Plan: Continue current plan  Purcell Nailslarence H Owen, MD 01/28/2018 7:04 PM

## 2018-01-28 NOTE — Progress Notes (Signed)
Due to patient's significant deficits, he will need prolonged rehab prior to d/c. He will need SNF. RN CM, Alesha and SW, RossJenna, made aware. We will sign off at this time. [phone]

## 2018-01-28 NOTE — Progress Notes (Signed)
Occupational Therapy Treatment Nicholas Mejia Details Name: Nicholas Mejia MRN: [id number] DOB: 09/26/1971 Today's Date: 01/28/2018    History of present illness Nicholas Mejia is a 47 year old man with a past medical history significant for stroke, type 2 diabetes without complication, hypertension, morbid obesity, coronary artery disease, and ischemic cardiomyopathy. Now S/p CABG x4;  Postop Imaging reveals Parenchymal hypodensity within the inferior left cerebellar hemisphere consistent with acute ischemic infarct, left PICA territory, R corona radiata and R caudate nucleus.  Remote R MCA infarct as well - underwent rehab at [facility] in Feb 2018. He was recently diagnosed with sleep apnea.    OT comments  Nicholas Mejia/OT co-tx for safety with the Vital-Tilt Bed, Nicholas Mejia was able to demonstrate increased tolerance for WB through BLE with the following BP as seen below. Nicholas Mejia with increased restlessness as the incline increased - calling out for "Ice". Nicholas Mejia continues with decreased cognition, R gaze preference (eyes closed mainly throughout session), Left hemiplegia (ROM provided through hand for digits which appeared to have slight edema, positioned on pillow at the end of session). Nicholas Mejia nodded "yes" his name, but only followed one step commands  33% of the time. Nicholas Mejia continues to be total A for ADL. Feel that Nicholas Mejia will require extended level of care beyond what CIR can offer - dc recommendation changed to SNF and frequency to reflect as well. OT will continue to follow in the acute setting.   10:24 - 132/83 (96) - supine HOB @ 30 degrees 10:33 - 108/68 (82) - Bed Flat 10:37 - 140/80 (97) - Bed Tilted at 15 degrees 10:39 - 156/108 (120) - Bed Tilted at 30 degrees 10:42 - 167/113 (127) - Bed Tilted at 30 degrees 10:43 - 139/89 (101) - supine HOB at 30 degrees  Follow Up Recommendations  SNF;Supervision/Assistance - 24 hour    Equipment Recommendations  Other (comment)(defer to next venue)    Recommendations for Other Services       Precautions / Restrictions Precautions Precautions: Sternal;Fall Restrictions Weight Bearing Restrictions: Yes Other Position/Activity Restrictions: Sternal precautions       Mobility Bed Mobility Overal bed mobility: Needs Assistance             General bed mobility comments: Total A +2 for repositioning in bed (pull up)  Transfers Overall transfer level: Needs assistance   Transfers: Sit to/from Stand(to 30 degrees) Sit to Stand: Total assist         General transfer comment: Tilt Bed Utilized to 30 degrees    Balance                                           ADL either performed or assessed with clinical judgement   ADL Overall ADL's : Needs assistance/impaired Eating/Feeding: NPO   Grooming: Maximal assistance Grooming Details (indicate cue type and reason): Able to reach face with RUE (breaking sternal precautions) but required hand over hand assist                             Functional mobility during ADLs: (Tilt bed used this session for safe WB through BLE) General ADL Comments: total A at this time     Vision   Vision Assessment?: Vision impaired- to be further tested in functional context Additional Comments: R gaze preference; L eye impairment PTA per previous note; will further assess -  did not track/head turn this session   Perception     Praxis      Cognition Arousal/Alertness: Lethargic Behavior During Therapy: Flat affect Overall Cognitive Status: Difficult to assess Area of Impairment: Attention;Following commands;Awareness;Problem solving                   Current Attention Level: Focused   Following Commands: Follows one step commands inconsistently   Awareness: Intellectual Problem Solving: Slow processing;Decreased initiation;Difficulty sequencing;Requires verbal cues;Requires tactile cues General Comments: Nicholas Mejia did not increase in arousal when tilted in bed, but seemed to become more  restless perseverating on "ice" Nicholas Mejia followed one step commands 33% of the time and did not participate in any grooming tasks requested        Exercises     Shoulder Instructions       General Comments VSS throughout session with use of Tilt Bed    Pertinent Vitals/ Pain       Pain Assessment: Faces Faces Pain Scale: Hurts a little bit Pain Location: generalized Pain Descriptors / Indicators: Grimacing;Other (Comment) Pain Intervention(s): Monitored during session  Home Living                                          Prior Functioning/Environment              Frequency  Min 2X/week        Progress Toward Goals  OT Goals(current goals can now be found in the care plan section)  Progress towards OT goals: Progressing toward goals  Acute Rehab OT Goals Nicholas Mejia Stated Goal: unable to formulate goals.   OT Goal Formulation: Nicholas Mejia unable to participate in goal setting  Plan Discharge plan needs to be updated;Frequency needs to be updated    Co-evaluation    Nicholas Mejia/OT/SLP Co-Evaluation/Treatment: Yes Reason for Co-Treatment: Complexity of the Nicholas Mejia's impairments (multi-system involvement);Necessary to address cognition/behavior during functional activity;For Nicholas Mejia/therapist safety;To address functional/ADL transfers Nicholas Mejia goals addressed during session: Strengthening/ROM OT goals addressed during session: ADL's and self-care;Strengthening/ROM      AM-PAC Nicholas Mejia "6 Clicks" Daily Activity     Outcome Measure   Help from another person eating meals?: Total Help from another person taking care of personal grooming?: Total Help from another person toileting, which includes using toliet, bedpan, or urinal?: Total Help from another person bathing (including washing, rinsing, drying)?: Total Help from another person to put on and taking off regular upper body clothing?: Total Help from another person to put on and taking off regular lower body clothing?:  Total 6 Click Score: 6    End of Session Equipment Utilized During Treatment: Oxygen  OT Visit Diagnosis: Other abnormalities of gait and mobility (R26.89);Muscle weakness (generalized) (M62.81);Low vision, both eyes (H54.2);Other symptoms and signs involving cognitive function;Hemiplegia and hemiparesis Hemiplegia - Right/Left: Left Hemiplegia - dominant/non-dominant: Dominant Hemiplegia - caused by: Cerebral infarction   Activity Tolerance Nicholas Mejia limited by lethargy   Nicholas Mejia Left in bed;with call bell/phone within reach;with restraints reapplied   Nurse Communication Mobility status;Other (comment)(Tile Bed Schedule written on white board)        Time: [id number] OT Time Calculation (min): 34 min  Charges: OT General Charges $OT Visit: 1 Visit OT Treatments $Therapeutic Activity: 8-22 mins Sherryl MangesLaura Armelia Penton OTR/L [phone]   Evern BioLaura J Delorse Shane 01/28/2018, 11:36 AM

## 2018-01-28 NOTE — Plan of Care (Signed)
  Education: Knowledge of disease or condition will improve 01/28/2018 1715 - Progressing by Loni DollyKelleher, Cassius Cullinane, RN Knowledge of secondary prevention will improve 01/28/2018 1715 - Progressing by Loni DollyKelleher, Anniemae Haberkorn, RN Knowledge of patient specific risk factors addressed and post discharge goals established will improve 01/28/2018 1715 - Progressing by Loni DollyKelleher, Miabella Shannahan, RN    Coping: Will verbalize positive feelings about self 01/28/2018 1715 - Progressing by Loni DollyKelleher, Vicent Febles, RN Will identify appropriate support needs 01/28/2018 1715 - Progressing by Loni DollyKelleher, Favian Kittleson, RN   Health Behavior/Discharge Planning: Ability to manage health-related needs will improve 01/28/2018 1715 - Progressing by Loni DollyKelleher, Kayline Sheer, RN   Self-Care: Ability to participate in self-care as condition permits will improve 01/28/2018 1715 - Progressing by Loni DollyKelleher, Adriena Manfre, RN Verbalization of feelings and concerns over difficulty with self-care will improve 01/28/2018 1715 - Progressing by Loni DollyKelleher, Wille Aubuchon, RN Ability to communicate needs accurately will improve 01/28/2018 1715 - Progressing by Loni DollyKelleher, Haroon Shatto, RN   Nutrition: Risk of aspiration will decrease 01/28/2018 1715 - Progressing by Loni DollyKelleher, Tyeisha Dinan, RN Dietary intake will improve 01/28/2018 1715 - Progressing by Loni DollyKelleher, Basya Casavant, RN

## 2018-01-28 NOTE — Progress Notes (Addendum)
Physical Therapy Treatment Patient Details Name: Nicholas Mejia MRN: [id number] DOB: [birth year] Today's Date: 01/28/2018    History of Present Illness Pt is a 47 y.o. male s/p CABG x4 on 01/23/2018. Post-op imaging consistent with acute ischemic infarct in L PICA territory, R corona radiata, and R caudate nucleus. Also showing remote R MCA infarct (pt underwent CIR in 12/2016). PMH includes CVA, DM, HTN, CAD, ischemic cardiomyopathy, sleep apnea, morbid obesity.    PT Comments    PT/OT co-treat for safety with patient's first tilt using VitalGo Tilt bed. Today's session focused on increased tolerance to upright and increased WB through BLE using tilt feature. Pt able to tolerate incline to 30 degrees (see BP values below). Demonstrated decreased interaction with eyes closed throughout session, only nodding yes appropriately to his name, and followed one-step commands ~33% of session. RN present and encouraged to continued tilting patient throughout shift. D/c recs updated to SNF-level therapies due to pt's decreased ability to tolerate intensive therapies. Will continue to follow acutely.  10:24 - 132/83 (96) - supine HOB @ 30 degrees 10:33 - 108/68 (82) - Bed Flat 10:37 - 140/80 (97) - Bed Tilted at 15 degrees 10:39 - 156/108 (120) - Bed Tilted at 30 degrees 10:42 - 167/113 (127) - Bed Tilted at 30 degrees  10:43 - 139/89 (101) - supine HOB at 30 degrees   Follow Up Recommendations  SNF;Supervision/Assistance - 24 hour     Equipment Recommendations  (TBD next venue)    Recommendations for Other Services       Precautions / Restrictions Precautions Precautions: Sternal;Fall Restrictions Other Position/Activity Restrictions: Sternal precautions    Mobility  Bed Mobility Overal bed mobility: Needs Assistance             General bed mobility comments: Total A +2 for repositioning in bed (pull up)  Transfers Overall transfer level: Needs assistance   Transfers: Sit to/from  Stand(to 30 degrees) Sit to Stand: Total assist         General transfer comment: Tilt Bed Utilized to 30 degrees  Ambulation/Gait                 Stairs            Wheelchair Mobility    Modified Rankin (Stroke Patients Only) Modified Rankin (Stroke Patients Only) Pre-Morbid Rankin Score: No significant disability Modified Rankin: Severe disability     Balance                                            Cognition Arousal/Alertness: Lethargic Behavior During Therapy: Flat affect Overall Cognitive Status: Difficult to assess Area of Impairment: Attention;Following commands;Awareness;Problem solving                   Current Attention Level: Focused   Following Commands: Follows one step commands inconsistently   Awareness: Intellectual Problem Solving: Slow processing;Decreased initiation;Difficulty sequencing;Requires verbal cues;Requires tactile cues General Comments: Pt did not show increase in arousal when tilted in bed, but seemed to become more restless perseverating on "ice" Pt followed one step commands 33% of the time and did not participate in any grooming tasks requested      Exercises      General Comments        Pertinent Vitals/Pain Pain Assessment: Faces Faces Pain Scale: Hurts a little bit Pain Location: generalized Pain Descriptors / Indicators:  Grimacing Pain Intervention(s): Monitored during session    Home Living                      Prior Function            PT Goals (current goals can now be found in the care plan section) Acute Rehab PT Goals PT Goal Formulation: Patient unable to participate in goal setting Time For Goal Achievement: 02/07/18 Potential to Achieve Goals: Fair Progress towards PT goals: Progressing toward goals    Frequency    Min 2X/week      PT Plan Discharge plan needs to be updated;Frequency needs to be updated    Co-evaluation PT/OT/SLP  Co-Evaluation/Treatment: Yes Reason for Co-Treatment: Complexity of the patient's impairments (multi-system involvement);Necessary to address cognition/behavior during functional activity;For patient/therapist safety;To address functional/ADL transfers PT goals addressed during session: Strengthening/ROM        AM-PAC PT "6 Clicks" Daily Activity  Outcome Measure  Difficulty turning over in bed (including adjusting bedclothes, sheets and blankets)?: Unable Difficulty moving from lying on back to sitting on the side of the bed? : Unable Difficulty sitting down on and standing up from a chair with arms (e.g., wheelchair, bedside commode, etc,.)?: Unable Help needed moving to and from a bed to chair (including a wheelchair)?: Total Help needed walking in hospital room?: Total Help needed climbing 3-5 steps with a railing? : Total 6 Click Score: 6    End of Session   Activity Tolerance: Patient limited by fatigue;Patient limited by lethargy Patient left: in bed;with call bell/phone within reach Nurse Communication: Mobility status;Other (comment)(recommendation to tilt multiple times throughout day) PT Visit Diagnosis: Other abnormalities of gait and mobility (R26.89);Hemiplegia and hemiparesis Hemiplegia - Right/Left: Left Hemiplegia - dominant/non-dominant: Non-dominant Hemiplegia - caused by: Cerebral infarction     Time: 1610-9604 PT Time Calculation (min) (ACUTE ONLY): 34 min  Charges:  $Therapeutic Activity: 8-22 mins                    G Codes:      Ina Homes, PT, DPT Acute Rehab Services  Pager: [phone]  Malachy Chamber 01/28/2018, 4:07 PM

## 2018-01-29 ENCOUNTER — Inpatient Hospital Stay (HOSPITAL_COMMUNITY): Payer: BLUE CROSS/BLUE SHIELD

## 2018-01-29 LAB — CBC
HCT: 31.7 % — ABNORMAL LOW (ref 39.0–52.0)
Hemoglobin: 9.5 g/dL — ABNORMAL LOW (ref 13.0–17.0)
MCH: 29 pg (ref 26.0–34.0)
MCHC: 30 g/dL (ref 30.0–36.0)
MCV: 96.6 fL (ref 78.0–100.0)
Platelets: 440 10*3/uL — ABNORMAL HIGH (ref 150–400)
RBC: 3.28 MIL/uL — AB (ref 4.22–5.81)
RDW: 16.2 % — AB (ref 11.5–15.5)
WBC: 16.8 10*3/uL — ABNORMAL HIGH (ref 4.0–10.5)

## 2018-01-29 LAB — BASIC METABOLIC PANEL
Anion gap: 10 (ref 5–15)
BUN: 29 mg/dL — ABNORMAL HIGH (ref 6–20)
CALCIUM: 8.6 mg/dL — AB (ref 8.9–10.3)
CO2: 30 mmol/L (ref 22–32)
CREATININE: 1.09 mg/dL (ref 0.61–1.24)
Chloride: 111 mmol/L (ref 101–111)
GFR calc Af Amer: >60 mL/min (ref >60)
GLUCOSE: 155 mg/dL — AB (ref 65–99)
POTASSIUM: 4.4 mmol/L (ref 3.5–5.1)
SODIUM: 151 mmol/L — AB (ref 135–145)

## 2018-01-29 LAB — GLUCOSE, CAPILLARY
GLUCOSE-CAPILLARY: 173 mg/dL — AB (ref 65–99)
GLUCOSE-CAPILLARY: 120 mg/dL — AB (ref 65–99)
GLUCOSE-CAPILLARY: 159 mg/dL — AB (ref 65–99)
Glucose-Capillary: 121 mg/dL — ABNORMAL HIGH (ref 65–99)

## 2018-01-29 MED ORDER — PANTOPRAZOLE SODIUM 40 MG PO PACK
40.0000 mg | PACK | Freq: Every day | ORAL | Status: DC
  Administered 2018-01-29 – 2018-02-11 (×14): 40 mg
  Filled 2018-01-29 (×14): qty 20

## 2018-01-29 MED ORDER — DOCUSATE SODIUM 50 MG/5ML PO LIQD
200.0000 mg | Freq: Every day | ORAL | Status: DC
  Administered 2018-01-29 – 2018-02-12 (×15): 200 mg
  Filled 2018-01-29 (×16): qty 20

## 2018-01-29 NOTE — Progress Notes (Signed)
Patient ID: Nicholas Mejia, male   DOB: 08/23/1971, 47 y.o.   MRN: [id number] TCTS Evening Rounds:  Hemodynamically stable  BP 124/77   Pulse (!) 105   Temp 99.3 F (37.4 C) (Oral)   Resp (!) 21   Ht 6\' 2"  (1.88 m)   Wt (!) 174.6 kg (385 lb)   SpO2 94%   BMI 49.43 kg/m   Reportedly talking a little this am but more lethargic as they day goes on . Moving his left hand a little.  Urine output ok.  Tolerating TF.

## 2018-01-29 NOTE — Progress Notes (Addendum)
  Speech Language Pathology Treatment: Dysphagia;Cognitive-Linquistic  Patient Details Name: Nicholas Mejia MRN: [id number] DOB: 1971-06-12 Today's Date: 01/29/2018 Time: [id number] SLP Time Calculation (min) (ACUTE ONLY): 27 min  Assessment / Plan / Recommendation Clinical Impression  Mr Nicholas Mejia was able to maintain an awake state with less cues today becoming drowsy mid session. Required max phrase completion cues to state information re: time and situational (month, year, hospital) with 30% accuracy. Stated fiance's name independently; biographical yes/no questions with 40%. When cued he is able to briefly attend people on his left side.   Oral care preceded trials of ice chips with delayed cough and intermittent anterior spill. Barriers to safe swallow and performing FEES include decreased respiratory support for volitional cough or speech, suspected flaccid soft palate given sudden snoring-like noises, significant left sided facial weakness and alertness level that continues to fluctuate. Prognosis for performing FEES better when improvements seen in these areas.    HPI HPI: Nicholas Mejia is an 47 y.o. male with PMH of CAD, Diabetes Mellitus, HTN, Morbid obesity, OSA. Admitted for CABG. Patient underwent CABG procedure on 3.1.19 and has remained lethargic since then. Pt noted to not have been moving left side and Neurology was consulted for concern of stroke. CT head showed new patchy hypodensities in the right corona radiata and right caudate nucleus consistent with acute infarcts. CTA showed severe R m1 stenosis.  Intubated for procedure only      SLP Plan  Continue with current plan of care       Recommendations  Diet recommendations: NPO Medication Administration: Via alternative means                Oral Care Recommendations: Oral care QID Follow up Recommendations: Skilled Nursing facility SLP Visit Diagnosis: Dysphagia, unspecified (R13.10);Cognitive communication deficit  (R41.841) Attention and concentration deficit following: Cerebral infarction Plan: Continue with current plan of care       GO                Nicholas Mejia, Nicholas Mejia 01/29/2018, 2:10 PM  Nicholas Mejia M.Ed [employer]-SLP Pager [phone]

## 2018-01-29 NOTE — Progress Notes (Signed)
Cortrak Tube Team Note:  Consult received due to displaced cortrak tube. Xray showed that tube was kinked in stomach w/ tip directed into distal esophagus.   Retracted tube to 10 cm to relieve kink and reinserted to 90 cm. Re secured with nasal bridle.    Per the Cortrak monitor reading the tube tip is postpyloric, approximately D1/D2  No x-ray is required. RN may begin using tube.   If the tube becomes dislodged please keep the tube and contact the Cortrak team at [email or web address] (password [email or web address]) for replacement.  If after hours and replacement cannot be delayed, place a NG tube and confirm placement with an abdominal x-ray.   Christophe LouisNathan Caldwell Kronenberger RD, LDN, CNSC Clinical Nutrition Pager: [phone] 01/29/2018 10:52 AM

## 2018-01-29 NOTE — Plan of Care (Signed)
RN and pt of family discussed the pt.'s disease process.

## 2018-01-29 NOTE — Progress Notes (Signed)
Patient sleeping upon entering room. Per RN patient has not been wearing CPAP at night.

## 2018-01-29 NOTE — Progress Notes (Signed)
7 Days Post-Op Procedure(s) (LRB): CORONARY ARTERY BYPASS GRAFTING (CABG) TIMES Four,  USING LEFT INTERNAL MAMMARY ARTERY, RIGHT RADIAL ARTERY AND RIGHT SAPHENOUS LEG VEIN HARVESTED ENDOSCOPICALLY (N/A) RADIAL ARTERY HARVEST (Right) TRANSESOPHAGEAL ECHOCARDIOGRAM (TEE) (N/A) Subjective: Lethargic but does answer simple questions Asking for water  Objective: Vital signs in last 24 hours: Temp:  [98.1 F (36.7 C)-99.4 F (37.4 C)] 99.4 F (37.4 C) (03/08 0748) Pulse Rate:  [91-105] 101 (03/08 0700) Cardiac Rhythm: Normal sinus rhythm (03/08 0400) Resp:  [11-22] 17 (03/08 0700) BP: (97-159)/(45-130) 159/81 (03/08 0700) SpO2:  [91 %-98 %] 95 % (03/08 0700) Weight:  [385 lb (174.6 kg)] 385 lb (174.6 kg) (03/08 0312)  Hemodynamic parameters for last 24 hours:    Intake/Output from previous day: 03/07 0701 - 03/08 0700 In: 1435 [NG/GT:1435] Out: 1775 [Urine:1775] Intake/Output this shift: Total I/O In: -  Out: 325 [Urine:325]  General appearance: no distress Neurologic: left sided weakness and some minor movement with LUE Heart: regular rate and rhythm Lungs: diminished breath sounds bibasilar Abdomen: normal findings: soft, non-tender Wound: clean and dry  Lab Results: Recent Labs    01/28/18 0247 01/29/18 0511  WBC 15.2* 16.8*  HGB 9.1* 9.5*  HCT 29.5* 31.7*  PLT 399 440*   BMET:  Recent Labs    01/28/18 0247 01/29/18 0511  NA 146* 151*  K 4.3 4.4  CL 110 111  CO2 28 30  GLUCOSE 233* 155*  BUN 35* 29*  CREATININE 1.14 1.09  CALCIUM 8.4* 8.6*    PT/INR: No results for input(s): LABPROT, INR in the last 72 hours. ABG    Component Value Date/Time   PHART 7.345 (L) 01/26/2018 1048   HCO3 25.8 01/26/2018 1048   TCO2 27 01/26/2018 1048   ACIDBASEDEF 3.0 (H) 01/23/2018 0846   O2SAT 96.0 01/26/2018 1048   CBG (last 3)  Recent Labs    01/28/18 2007 01/29/18 0257 01/29/18 0746  GLUCAP 217* 173* 120*    Assessment/Plan: S/P Procedure(s)  (LRB): CORONARY ARTERY BYPASS GRAFTING (CABG) TIMES Four,  USING LEFT INTERNAL MAMMARY ARTERY, RIGHT RADIAL ARTERY AND RIGHT SAPHENOUS LEG VEIN HARVESTED ENDOSCOPICALLY (N/A) RADIAL ARTERY HARVEST (Right) TRANSESOPHAGEAL ECHOCARDIOGRAM (TEE) (N/A) - NEURO- a little more alert this morning than yesterday  Slow psychomotor response  Did move L hand wrist  With request to grip!  CV- stable, continue current meds  RESP - not able to participate with IS effectively  RENAL- creatinine stable  Foley in place secondary to retention  ENDO- CBG variable depending on TF  GI/ Nutrition- Cor trak doubled back on itself  Enoxaparin + SCD for DVT prophylaxis    LOS: 7 days    Loreli SlotSteven C Rafael Salway 01/29/2018

## 2018-01-30 LAB — GLUCOSE, CAPILLARY
GLUCOSE-CAPILLARY: 177 mg/dL — AB (ref 65–99)
GLUCOSE-CAPILLARY: 212 mg/dL — AB (ref 65–99)
GLUCOSE-CAPILLARY: 163 mg/dL — AB (ref 65–99)
Glucose-Capillary: 213 mg/dL — ABNORMAL HIGH (ref 65–99)
Glucose-Capillary: 222 mg/dL — ABNORMAL HIGH (ref 65–99)
Glucose-Capillary: 165 mg/dL — ABNORMAL HIGH (ref 65–99)

## 2018-01-30 LAB — BASIC METABOLIC PANEL
Anion gap: 10 (ref 5–15)
BUN: 38 mg/dL — ABNORMAL HIGH (ref 6–20)
CALCIUM: 8.2 mg/dL — AB (ref 8.9–10.3)
CHLORIDE: 110 mmol/L (ref 101–111)
CO2: 28 mmol/L (ref 22–32)
CREATININE: 1.16 mg/dL (ref 0.61–1.24)
GFR calc non Af Amer: >60 mL/min (ref >60)
Glucose, Bld: 228 mg/dL — ABNORMAL HIGH (ref 65–99)
Potassium: 4.3 mmol/L (ref 3.5–5.1)
SODIUM: 148 mmol/L — AB (ref 135–145)

## 2018-01-30 LAB — CREATININE, SERUM
Creatinine, Ser: 1.11 mg/dL (ref 0.61–1.24)
GFR calc Af Amer: >60 mL/min (ref >60)
GFR calc non Af Amer: >60 mL/min (ref >60)

## 2018-01-30 NOTE — Progress Notes (Signed)
8 Days Post-Op Procedure(s) (LRB): CORONARY ARTERY BYPASS GRAFTING (CABG) TIMES Four,  USING LEFT INTERNAL MAMMARY ARTERY, RIGHT RADIAL ARTERY AND RIGHT SAPHENOUS LEG VEIN HARVESTED ENDOSCOPICALLY (N/A) RADIAL ARTERY HARVEST (Right) TRANSESOPHAGEAL ECHOCARDIOGRAM (TEE) (N/A) Subjective: Would not verbalize for me but his girlfriend says that he has said a few words.  Objective: Vital signs in last 24 hours: Temp:  [98.2 F (36.8 C)-99.3 F (37.4 C)] 98.4 F (36.9 C) (03/09 1140) Pulse Rate:  [96-114] 103 (03/09 1300) Cardiac Rhythm: (P) Normal sinus rhythm (03/09 0800) Resp:  [10-25] 19 (03/09 1300) BP: (89-148)/(36-96) 99/76 (03/09 1300) SpO2:  [88 %-98 %] 92 % (03/09 1300) Weight:  [93.9 kg (207 lb)] 93.9 kg (207 lb) (03/09 0500)  Hemodynamic parameters for last 24 hours:    Intake/Output from previous day: 03/08 0701 - 03/09 0700 In: 1240.2 [NG/GT:1240.2] Out: 1450 [Urine:1450] Intake/Output this shift: Total I/O In: 690 [NG/GT:690] Out: 720 [Urine:720]  General appearance: eyes open Neurologic: will not move left UE for me but lifts right UE to command Heart: regular rate and rhythm, S1, S2 normal, no murmur, click, rub or gallop Lungs: clear to auscultation bilaterally Abdomen: soft, non-tender; bowel sounds normal; no masses,  no organomegaly Extremities: edema mild Incisions ok    Lab Results: Recent Labs    01/28/18 0247 01/29/18 0511  WBC 15.2* 16.8*  HGB 9.1* 9.5*  HCT 29.5* 31.7*  PLT 399 440*   BMET:  Recent Labs    01/29/18 0511 01/30/18 0526 01/30/18 0921  NA 151*  --  148*  K 4.4  --  4.3  CL 111  --  110  CO2 30  --  28  GLUCOSE 155*  --  228*  BUN 29*  --  38*  CREATININE 1.09 1.11 1.16  CALCIUM 8.6*  --  8.2*    PT/INR: No results for input(s): LABPROT, INR in the last 72 hours. ABG    Component Value Date/Time   PHART 7.345 (L) 01/26/2018 1048   HCO3 25.8 01/26/2018 1048   TCO2 27 01/26/2018 1048   ACIDBASEDEF 3.0 (H)  01/23/2018 0846   O2SAT 96.0 01/26/2018 1048   CBG (last 3)  Recent Labs    01/30/18 0212 01/30/18 0814 01/30/18 1138  GLUCAP 213* 177* 212*    Assessment/Plan: S/P Procedure(s) (LRB): CORONARY ARTERY BYPASS GRAFTING (CABG) TIMES Four,  USING LEFT INTERNAL MAMMARY ARTERY, RIGHT RADIAL ARTERY AND RIGHT SAPHENOUS LEG VEIN HARVESTED ENDOSCOPICALLY (N/A) RADIAL ARTERY HARVEST (Right) TRANSESOPHAGEAL ECHOCARDIOGRAM (TEE) (N/A)  Hemodynamically stable  Respiratory status stable.   Renal function stable with normal creat.  Tolerating TF  Neuro status stable.   LOS: 8 days    Alleen BorneBryan K Lagena Strand 01/30/2018

## 2018-01-31 LAB — GLUCOSE, CAPILLARY
GLUCOSE-CAPILLARY: 192 mg/dL — AB (ref 65–99)
GLUCOSE-CAPILLARY: 228 mg/dL — AB (ref 65–99)
Glucose-Capillary: 155 mg/dL — ABNORMAL HIGH (ref 65–99)
Glucose-Capillary: 209 mg/dL — ABNORMAL HIGH (ref 65–99)
Glucose-Capillary: 224 mg/dL — ABNORMAL HIGH (ref 65–99)

## 2018-01-31 MED ORDER — ASPIRIN 325 MG PO TABS
325.0000 mg | ORAL_TABLET | Freq: Every day | ORAL | Status: DC
  Administered 2018-02-01 – 2018-02-13 (×13): 325 mg
  Filled 2018-01-31 (×14): qty 1

## 2018-01-31 MED ORDER — ASPIRIN 325 MG PO TABS
325.0000 mg | ORAL_TABLET | Freq: Once | ORAL | Status: AC
  Administered 2018-01-31: 325 mg
  Filled 2018-01-31: qty 1

## 2018-01-31 NOTE — Progress Notes (Signed)
9 Days Post-Op Procedure(s) (LRB): CORONARY ARTERY BYPASS GRAFTING (CABG) TIMES Four,  USING LEFT INTERNAL MAMMARY ARTERY, RIGHT RADIAL ARTERY AND RIGHT SAPHENOUS LEG VEIN HARVESTED ENDOSCOPICALLY (N/A) RADIAL ARTERY HARVEST (Right) TRANSESOPHAGEAL ECHOCARDIOGRAM (TEE) (N/A) Subjective: Would not verbalize for me and is lethargic but his girlfriend says that he has said " I love you"  Objective: Vital signs in last 24 hours: Temp:  [98.2 F (36.8 C)-100.1 F (37.8 C)] 99.6 F (37.6 C) (03/10 0745) Pulse Rate:  [95-109] 104 (03/10 0700) Cardiac Rhythm: Normal sinus rhythm (03/10 0400) Resp:  [11-22] 16 (03/10 0700) BP: (86-123)/(33-78) 116/69 (03/10 0700) SpO2:  [91 %-97 %] 94 % (03/10 0700) Weight:  [92.7 kg (204 lb 5.9 oz)] 92.7 kg (204 lb 5.9 oz) (03/10 0600)  Hemodynamic parameters for last 24 hours:    Intake/Output from previous day: 03/09 0701 - 03/10 0700 In: 2030 [NG/GT:2030] Out: 1580 [Urine:1580] Intake/Output this shift: No intake/output data recorded.  General appearance: eyes open Neurologic: will not move left UE for me but squeezes right hand. Slight motion left foot. Heart: regular rate and rhythm, S1, S2 normal, no murmur, click, rub or gallop Lungs: clear to auscultation bilaterally Abdomen: soft, non-tender; bowel sounds normal; no masses,  no organomegaly Extremities: edema mild Incisions ok    Lab Results: Recent Labs    01/29/18 0511  WBC 16.8*  HGB 9.5*  HCT 31.7*  PLT 440*   BMET:  Recent Labs    01/29/18 0511 01/30/18 0526 01/30/18 0921  NA 151*  --  148*  K 4.4  --  4.3  CL 111  --  110  CO2 30  --  28  GLUCOSE 155*  --  228*  BUN 29*  --  38*  CREATININE 1.09 1.11 1.16  CALCIUM 8.6*  --  8.2*    PT/INR: No results for input(s): LABPROT, INR in the last 72 hours. ABG    Component Value Date/Time   PHART 7.345 (L) 01/26/2018 1048   HCO3 25.8 01/26/2018 1048   TCO2 27 01/26/2018 1048   ACIDBASEDEF 3.0 (H) 01/23/2018 0846    O2SAT 96.0 01/26/2018 1048   CBG (last 3)  Recent Labs    01/30/18 2317 01/31/18 0329 01/31/18 0744  GLUCAP 165* 192* 155*    Assessment/Plan: S/P Procedure(s) (LRB): CORONARY ARTERY BYPASS GRAFTING (CABG) TIMES Four,  USING LEFT INTERNAL MAMMARY ARTERY, RIGHT RADIAL ARTERY AND RIGHT SAPHENOUS LEG VEIN HARVESTED ENDOSCOPICALLY (N/A) RADIAL ARTERY HARVEST (Right) TRANSESOPHAGEAL ECHOCARDIOGRAM (TEE) (N/A)  Hemodynamically stable  Respiratory status stable.   Tolerating TF  Neuro status stable.    LOS: 9 days    Alleen BorneBryan K Persephone Schriever 01/31/2018

## 2018-01-31 NOTE — Progress Notes (Signed)
Patient ID: Nicholas Mejia, male   DOB: January 15, 1971, 47 y.o.   MRN: [id number] TCTS Evening  Rounds:  Hemodynamically stable in sinus rhythm. sats 99% Urine output good Tolerating tube feeds No changes today.

## 2018-01-31 NOTE — Clinical Social Work Note (Signed)
Clinical Social Work Assessment  Patient Details  Name: Nicholas Mejia MRN: [id number] Date of Birth: 09-07-1971  Date of referral:  01/31/18               Reason for consult:  Facility Placement                Permission sought to share information with:  Family Supports Permission granted to share information::  Yes, Release of Information Signed  Name::     Hilda LiasMarie  Agency::     Relationship::  Mother  Contact Information:  [phone]  Housing/Transportation Living arrangements for the past 2 months:  Single Family Home Source of Information:  Parent Patient Interpreter Needed:  None Criminal Activity/Legal Involvement Pertinent to Current Situation/Hospitalization:  No - Comment as needed Significant Relationships:  Friend, Parents Lives with:  Self Do you feel safe going back to the place where you live?    Need for family participation in patient care:     Care giving concerns:  CSW spoke with pt's mother via telephone. Pt was living alone independently prior to admission.   Social Worker assessment / plan:  CSW spoke with pt at bedside. RN is unable to assess pt's orientation because pt is lethargic and mumbling. Per pt's mother pt was in CIR and was admitted onto the unit for stroke. Pt's mother thought pt would be going back to CIR but CSW explained the MD does not believe pt can do the intensive therapy therefore they are recommending SNF. Pt's mother expressed understanding. [facility] Pt's mother states she is going to do her research on the facilities as they wait for b/o. CSW to follow up with b/o once available.   Employment status:    Insurance informationAdvertising account executive:  Managed Medicare PT Recommendations:  Skilled Nursing Facility Information / Referral to community resources:  Skilled Nursing Facility  Patient/Family's Response to care:  Pt's mother verbalized understanding of CSW role and expressed appreciation for support. Pt's mother denies  any concern regarding pt care at this time.   Patient/Family's Understanding of and Emotional Response to Diagnosis, Current Treatment, and Prognosis:  Pt's mother understanding and realistic regarding pt's physical limitations. Pt mother understands pt's need for SNF placement at d/c. Pt's mother agreeable for pt to go to SNF at d/c, at this time. Pt's mother denies any concern regarding pt's treatment plan at this time. CSW will continue to provide support and facilitate d/c needs.   Emotional Assessment Appearance:  Appears stated age Attitude/Demeanor/Rapport:  Unable to Assess Affect (typically observed):  Unable to Assess Orientation:  (UTA, lethargic, will not communicate) Alcohol / Substance use:  Not Applicable Psych involvement (Current and /or in the community):  No (Comment)  Discharge Needs  Concerns to be addressed:    Readmission within the last 30 days:    Current discharge risk:  Dependent with Mobility Barriers to Discharge:  Continued Medical Work up   Pacific MutualBridget A Shizuko Wojdyla, LCSW 01/31/2018, 3:29 PM

## 2018-01-31 NOTE — [place] FL2 (Signed)
[place] MEDICAID FL2 LEVEL OF CARE SCREENING TOOL     IDENTIFICATION  Patient Name: Nicholas Mejia Birthdate: [birth year] Sex: male Admission Date (Current Location): 01-Feb-2018  [facility] and [place] Number:  Producer, television/film/video and Address:  [facility], [address] [place], [place] [address]      Provider Number: [id number]  Attending Physician Name and Address:  Loreli Slot, MD  Relative Name and Phone Number:       Current Level of Care: Hospital Recommended Level of Care: Skilled Nursing Facility Prior Approval Number:    Date Approved/Denied:   PASRR Number: [id number]  Discharge Plan: SNF    Current Diagnoses: Patient Active Problem List   Diagnosis Date Noted  . Cerebral thrombosis with cerebral infarction 01/24/2018  . S/P CABG x 4 2018-02-01  . Ischemic cardiomyopathy   . Abnormal stress test 12/21/2017  . Nausea & vomiting   . Indigestion   . Gastroesophageal reflux disease   . Uncontrolled type 2 diabetes mellitus with complication, without long-term current use of insulin (HCC)   . Non-intractable vomiting with nausea   . Stage 2 chronic kidney disease   . Ataxia, post-stroke   . Stage 3 chronic kidney disease (HCC)   . Hyponatremia   . Type II diabetes mellitus, uncontrolled (HCC)   . Benign essential HTN   . Mixed hyperlipidemia   . 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 Diabetes mellitus without complication (HCC) 12/31/2016  . AKI (acute kidney injury) (HCC) 12/31/2016  . Leukocytosis 12/31/2016  . Essential hypertension 12/31/2016    Orientation RESPIRATION BLADDER Height & Weight     (UTA, lethargic)  Normal Continent Weight: 204 lb 5.9 oz (92.7 kg) Height:  6\' 2"  (188 cm)  BEHAVIORAL SYMPTOMS/MOOD NEUROLOGICAL BOWEL NUTRITION STATUS      Incontinent Feeding tube(Cortrak tube, 90mL)  AMBULATORY STATUS COMMUNICATION OF NEEDS Skin    Total Care Verbally(Expressive aphasia;Delayed responses;Slurred/Dysarthria;Incomprehensible)                         Personal Care Assistance Level of Assistance  Bathing, Feeding, Dressing Bathing Assistance: Maximum assistance Feeding assistance: Limited assistance Dressing Assistance: Maximum assistance     Functional Limitations Info  Sight, Hearing, Speech Sight Info: Adequate Hearing Info: Adequate Speech Info: (Expressive aphasia;Delayed responses;Slurred/Dysarthria;Incomprehensible)    SPECIAL CARE FACTORS FREQUENCY  PT (By licensed PT), OT (By licensed OT)     PT Frequency: 2x OT Frequency: 2x            Contractures Contractures Info: Not present    Additional Factors Info  Code Status, Allergies Code Status Info: Full Code Allergies Info: Bee Venom, Metoprolol, Penicillins, Pollen Extract           Current Medications (01/31/2018):  This is the current hospital active medication list Current Facility-Administered Medications  Medication Dose Route Frequency Provider Last Rate Last Dose  . 0.9 %  sodium chloride infusion   Intravenous Once Loreli Slot, MD      . Melene Muller ON 02/01/2018] aspirin tablet 325 mg  325 mg Per Tube Daily Loreli Slot, MD      . bisacodyl (DULCOLAX) EC tablet 10 mg  10 mg Oral Daily Gold, Wayne E, PA-C   10 mg at 01/30/18 6433   Or  . bisacodyl (DULCOLAX) suppository 10 mg  10 mg Rectal Daily Rowe Clack, PA-C  10 mg at 01/29/18 1548  . chlorhexidine (PERIDEX) 0.12 % solution 15 mL  15 mL Mouth Rinse BID Loreli SlotHendrickson, Steven C, MD   15 mL at 01/31/18 1015  . Chlorhexidine Gluconate Cloth 2 % PADS 6 each  6 each Topical Daily Loreli SlotHendrickson, Steven C, MD   6 each at 01/30/18 [phone]  . clopidogrel (PLAVIX) tablet 75 mg  75 mg Oral Daily Loreli SlotHendrickson, Steven C, MD   75 mg at 01/31/18 1015  . docusate (COLACE) 50 MG/5ML liquid 200 mg  200 mg Per Tube Daily Loreli SlotHendrickson, Steven C, MD   200 mg at 01/31/18 1015  .  enoxaparin (LOVENOX) injection 40 mg  40 mg Subcutaneous QHS Loreli SlotHendrickson, Steven C, MD   40 mg at 01/30/18 2318  . feeding supplement (JEVITY 1.2 CAL) liquid 1,000 mL  1,000 mL Per Tube Continuous Loreli SlotHendrickson, Steven C, MD 70 mL/hr at 01/31/18 0600 1,000 mL at 01/31/18 0600  . feeding supplement (PRO-STAT SUGAR FREE 64) liquid 30 mL  30 mL Per Tube QID Loreli SlotHendrickson, Steven C, MD   30 mL at 01/31/18 1016  . hydrALAZINE (APRESOLINE) injection 10 mg  10 mg Intravenous Q6H PRN Loreli SlotHendrickson, Steven C, MD   10 mg at 01/27/18 0136  . insulin aspart (novoLOG) injection 0-24 Units  0-24 Units Subcutaneous Q6H Loreli SlotHendrickson, Steven C, MD   2 Units at 01/31/18 0809  . insulin detemir (LEVEMIR) injection 40 Units  40 Units Subcutaneous BID Loreli SlotHendrickson, Steven C, MD   40 Units at 01/31/18 1017  . irbesartan (AVAPRO) tablet 300 mg  300 mg Oral Daily Loreli SlotHendrickson, Steven C, MD   300 mg at 01/31/18 1015  . isosorbide-hydrALAZINE (BIDIL) 20-37.5 MG per tablet 1 tablet  1 tablet Oral TID Loreli SlotHendrickson, Steven C, MD   1 tablet at 01/31/18 1015  . MEDLINE mouth rinse  15 mL Mouth Rinse BID Loreli SlotHendrickson, Steven C, MD   15 mL at 01/30/18 2200  . MEDLINE mouth rinse  15 mL Mouth Rinse q12n4p Loreli SlotHendrickson, Steven C, MD   15 mL at 01/31/18 1216  . montelukast (SINGULAIR) tablet 10 mg  10 mg Oral QHS PRN Loreli SlotHendrickson, Steven C, MD      . ondansetron [facility](ZOFRAN) injection 4 mg  4 mg Intravenous Q6H PRN Gershon CraneGold, Wayne E, PA-C   4 mg at 02/02/2018 2045  . pantoprazole sodium (PROTONIX) 40 mg/20 mL oral suspension 40 mg  40 mg Per Tube Daily Loreli SlotHendrickson, Steven C, MD   40 mg at 01/31/18 1016  . sodium chloride flush (NS) 0.9 % injection 10-40 mL  10-40 mL Intracatheter PRN Loreli SlotHendrickson, Steven C, MD      . tamsulosin [facility](FLOMAX) capsule 0.4 mg  0.4 mg Oral Daily Doree FudgeZimmerman, Donielle M, PA-C   0.4 mg at 01/31/18 1015  . traMADol (ULTRAM) tablet 50-100 mg  50-100 mg Oral Q4H PRN Gershon CraneGold, Wayne E, PA-C   50 mg at 01/29/18 2257     Discharge  Medications: Please see discharge summary for a list of discharge medications.  Relevant Imaging Results:  Relevant Lab Results:   Additional Information SSN: [id number]  Maree KrabbeBridget A Katricia Prehn, LCSW

## 2018-01-31 NOTE — Progress Notes (Signed)
Placed patient on CPAP for the night via auto-mode. Oxygen set at 2lpm. 

## 2018-02-01 LAB — GLUCOSE, CAPILLARY
GLUCOSE-CAPILLARY: 95 mg/dL (ref 65–99)
GLUCOSE-CAPILLARY: 79 mg/dL (ref 65–99)
GLUCOSE-CAPILLARY: 139 mg/dL — AB (ref 65–99)
GLUCOSE-CAPILLARY: 212 mg/dL — AB (ref 65–99)
Glucose-Capillary: 117 mg/dL — ABNORMAL HIGH (ref 65–99)
Glucose-Capillary: 163 mg/dL — ABNORMAL HIGH (ref 65–99)

## 2018-02-01 MED ORDER — ORAL CARE MOUTH RINSE
15.0000 mL | Freq: Two times a day (BID) | OROMUCOSAL | Status: DC
  Administered 2018-02-01 – 2018-02-03 (×5): 15 mL via OROMUCOSAL

## 2018-02-01 MED ORDER — CHLORHEXIDINE GLUCONATE 0.12 % MT SOLN
15.0000 mL | Freq: Two times a day (BID) | OROMUCOSAL | Status: DC
  Administered 2018-02-01 – 2018-02-02 (×4): 15 mL via OROMUCOSAL
  Filled 2018-02-01 (×3): qty 15

## 2018-02-01 MED ORDER — IRBESARTAN 150 MG PO TABS
150.0000 mg | ORAL_TABLET | Freq: Every day | ORAL | Status: DC
  Administered 2018-02-01 – 2018-02-02 (×2): 150 mg via ORAL
  Filled 2018-02-01 (×3): qty 1

## 2018-02-01 NOTE — Progress Notes (Signed)
Nutrition Follow-up  DOCUMENTATION CODES:   Morbid obesity  INTERVENTION:   Continue:  Jevity 1.2 @ 60m/hr  30 prostat QID  Provides 2416kcal, 153g protein, and 13616mfree water.  NUTRITION DIAGNOSIS:   Inadequate oral intake related to inability to eat as evidenced by NPO status. Ongoing.   GOAL:   Patient will meet greater than or equal to 90% of their needs Met.   MONITOR:   Diet advancement, Skin, Weight trends, I & O's, Labs  ASSESSMENT:   [age].o.malewith PMH of CAD, DM, and HTN. S/P CABG x4 and perioperative CVA.  3/1 CABG x4; pt extubated 3/4 Cortrak, gastric placement 3/11 Pt's cortrak replaced, this is patient's 4th cortrak tube  Remains lethargic at times. Plan for SNF at d/c.  Seen by SLP who reports pt able to stay awake during session and seems ready for FEES which is planned for tomorrow 3/12.   Medications reviewed and include: dulcolax, colace, levemir, SSI Labs reviewed: Na 148 (H) CBG (last 3)  Recent Labs    01/31/18 2331 02/01/18 0304 02/01/18 0740  GLUCAP 117* 95 79     TF: Jevity 1.2 @ 7060mr x 24 hours with prostat QID; this provides 2416kcal, 153g protein, and 1361m76mee water.   Diet Order:  Fall precautions  EDUCATION NEEDS:   Not appropriate for education at this time  Skin:  Skin Assessment: Skin Integrity Issues: Skin Integrity Issues:: Incisions Incisions: R arm, R leg, chest  Last BM:  3/9 medium  Height:   Ht Readings from Last 1 Encounters:  02/01/18 6' 2.02" (1.88 m)    Weight:   Wt Readings from Last 1 Encounters:  02/01/18 (S) (!) 312 lb 9.8 oz (141.8 kg)    Ideal Body Weight:  86.36 kg  BMI:  Body mass index is 40.12 kg/m.  Estimated Nutritional Needs:   Kcal:  2200-2400  Protein:  148-163g  Fluid:  2.2-2.4L  HeatMaylon Peppers LDN, CNSC [phone] Hours Pager

## 2018-02-01 NOTE — Progress Notes (Signed)
Per RN patient does not wear CPAP.

## 2018-02-01 NOTE — Progress Notes (Signed)
10 Days Post-Op Procedure(s) (LRB): CORONARY ARTERY BYPASS GRAFTING (CABG) TIMES Four,  USING LEFT INTERNAL MAMMARY ARTERY, RIGHT RADIAL ARTERY AND RIGHT SAPHENOUS LEG VEIN HARVESTED ENDOSCOPICALLY (N/A) RADIAL ARTERY HARVEST (Right) TRANSESOPHAGEAL ECHOCARDIOGRAM (TEE) (N/A) Subjective: Up in chair somnolent  Objective: Vital signs in last 24 hours: Temp:  [98 F (36.7 C)-99.6 F (37.6 C)] 98.7 F (37.1 C) (03/11 0309) Pulse Rate:  [88-107] 95 (03/11 0500) Cardiac Rhythm: Sinus tachycardia (03/10 2300) Resp:  [11-29] 14 (03/11 0500) BP: (91-140)/(44-117) 112/81 (03/11 0500) SpO2:  [87 %-100 %] 99 % (03/11 0500)  Hemodynamic parameters for last 24 hours:    Intake/Output from previous day: 03/10 0701 - 03/11 0700 In: 1337.5 [NG/GT:1337.5] Out: 1580 [Urine:1580] Intake/Output this shift: No intake/output data recorded.  General appearance: no distress and somnolent Neurologic: left sided weakness and minimal grip with left hand, not moving arm Heart: regular rate and rhythm Lungs: diminished breath sounds bibasilar Abdomen: normal findings: soft, non-tender Wound: clean and dry  Lab Results: No results for input(s): WBC, HGB, HCT, PLT in the last 72 hours. BMET:  Recent Labs    01/30/18 0526 01/30/18 0921  NA  --  148*  K  --  4.3  CL  --  110  CO2  --  28  GLUCOSE  --  228*  BUN  --  38*  CREATININE 1.11 1.16  CALCIUM  --  8.2*    PT/INR: No results for input(s): LABPROT, INR in the last 72 hours. ABG    Component Value Date/Time   PHART 7.345 (L) 01/26/2018 1048   HCO3 25.8 01/26/2018 1048   TCO2 27 01/26/2018 1048   ACIDBASEDEF 3.0 (H) 01/23/2018 0846   O2SAT 96.0 01/26/2018 1048   CBG (last 3)  Recent Labs    01/31/18 1557 01/31/18 1944 02/01/18 0304  GLUCAP 228* 224* 95    Assessment/Plan: S/P Procedure(s) (LRB): CORONARY ARTERY BYPASS GRAFTING (CABG) TIMES Four,  USING LEFT INTERNAL MAMMARY ARTERY, RIGHT RADIAL ARTERY AND RIGHT SAPHENOUS  LEG VEIN HARVESTED ENDOSCOPICALLY (N/A) RADIAL ARTERY HARVEST (Right) TRANSESOPHAGEAL ECHOCARDIOGRAM (TEE) (N/A) -NEURO- no significant change over the weekend  CV- BP low at times will decrease irbesartan  RESP- pulmonary hygiene as able  RENAL- creatinine and lytes Ok, Foley in for urinary retention  ENDO_ CBG down with TF on hold, hold levemir until TF resumed  GI/ Nutrition- needs cortrak tube replaced and Tf resumed   LOS: 10 days    Loreli SlotSteven C Ruben Pyka 02/01/2018

## 2018-02-01 NOTE — Progress Notes (Signed)
  Speech Language Pathology Treatment: Dysphagia;Cognitive-Linquistic  Patient Details Name: Nicholas Mejia MRN: [id number] DOB: 10-Aug-1971 Today's Date: 02/01/2018 Time: [id number] SLP Time Calculation (min) (ACUTE ONLY): 14 min  Assessment / Plan / Recommendation Clinical Impression  Pt seen for dysphagia and aphasia-cognitive treatment. Pt now able to stay awake during session. SLP facilitated pt attention by standing on left side providing max verbal/visual/tactile cues to follow/reach for spoon on left side which he is able to come almost to midline. Right hand touching/reaching for objects (hypertactile). Gestured 50% when responding to questions and reminded to use voice needing additional time and repetition of directions/information.   Consumed 2 bites applesauce, ice chips with audible and multiple swallows; no cough or wet vocal quality. Decreased oral manipulation. Pt appears ready for FEES and will plan on performing tomorrow.    HPI HPI: Nicholas Mejia is an 47 y.o. male with PMH of CAD, Diabetes Mellitus, HTN, Morbid obesity, OSA. Admitted for CABG. Patient underwent CABG procedure on 3.1.19 and has remained lethargic since then. Pt noted to not have been moving left side and Neurology was consulted for concern of stroke. CT head showed new patchy hypodensities in the right corona radiata and right caudate nucleus consistent with acute infarcts. CTA showed severe R m1 stenosis.  Intubated for procedure only      SLP Plan  Continue with current plan of care;Other (Comment)(FEES 3/12)       Recommendations  Diet recommendations: NPO Medication Administration: Via alternative means                Oral Care Recommendations: Oral care QID Follow up Recommendations: Skilled Nursing facility SLP Visit Diagnosis: Dysphagia, unspecified (R13.10);Cognitive communication deficit (R41.841) Plan: Continue with current plan of care;Other (Comment)(FEES 3/12)                       Nicholas Mejia, Nicholas Danh Willis 02/01/2018, 9:33 AM   Nicholas Mejia M.Ed [employer]-SLP Pager [phone]

## 2018-02-01 NOTE — Progress Notes (Signed)
Cortrak Tube Team Note:  Consult received to place a Cortrak feeding tube.   A 10 F Cortrak tube was placed in the left nare and secured with a nasal bridle at 90 cm. Per the Cortrak monitor reading the tube tip is post pyloric.   No x-ray is required. RN may begin using tube.   If the tube becomes dislodged please keep the tube and contact the Cortrak team at [email or web address] (password [email or web address]) for replacement.  If after hours and replacement cannot be delayed, place a NG tube and confirm placement with an abdominal x-ray.    Nicholas Holidayasey Tichina Koebel MS, RD, LDN Pager #- [phone] After Hours Pager: [phone]

## 2018-02-01 NOTE — Progress Notes (Signed)
Patient ID: Nicholas Mejia, male   DOB: [birth year], 47 y.o.   MRN: [id number] EVENING ROUNDS NOTE :     [address]       [place] [id number]             [phone]                 10 Days Post-Op Procedure(s) (LRB): CORONARY ARTERY BYPASS GRAFTING (CABG) TIMES Four,  USING LEFT INTERNAL MAMMARY ARTERY, RIGHT RADIAL ARTERY AND RIGHT SAPHENOUS LEG VEIN HARVESTED ENDOSCOPICALLY (N/A) RADIAL ARTERY HARVEST (Right) TRANSESOPHAGEAL ECHOCARDIOGRAM (TEE) (N/A)  Total Length of Stay:  LOS: 10 days  BP 109/80   Pulse 96   Temp 98.8 F (37.1 C) (Oral)   Resp 13   Ht 6' 2.02" (1.88 m)   Wt (S) (!) 312 lb 9.8 oz (141.8 kg)   SpO2 98%   BMI 40.12 kg/m   .Intake/Output      03/10 0701 - 03/11 0700 03/11 0701 - 03/12 0700   NG/GT 1337.5 330   Total Intake(mL/kg) 1337.5 (14.4) 330 (2.3)   Urine (mL/kg/hr) 1630 (0.7) 625 (0.4)   Emesis/NG output     Stool     Total Output 1630 625   Net -292.5 -295          . sodium chloride    . feeding supplement (JEVITY 1.2 CAL) 1,000 mL (02/01/18 1500)     Lab Results  Component Value Date   WBC 16.8 (H) 01/29/2018   HGB 9.5 (L) 01/29/2018   HCT 31.7 (L) 01/29/2018   PLT 440 (H) 01/29/2018   GLUCOSE 228 (H) 01/30/2018   CHOL 83 01/24/2018   TRIG 80 01/24/2018   HDL 31 (L) 01/24/2018   LDLCALC 36 01/24/2018   ALT 15 (L) 01/26/2018   AST 17 01/26/2018   NA 148 (H) 01/30/2018   K 4.3 01/30/2018   CL 110 01/30/2018   CREATININE 1.16 01/30/2018   BUN 38 (H) 01/30/2018   CO2 28 01/30/2018   INR 1.33 Aug 07, 2018   HGBA1C 7.2 (H) 01/24/2018   Up to chair  Moving right side and trying to talk   Delight OvensEdward B Garritt Molyneux MD  Beeper [phone] Office [phone] 02/01/2018 5:04 PM

## 2018-02-02 LAB — GLUCOSE, CAPILLARY
GLUCOSE-CAPILLARY: 260 mg/dL — AB (ref 65–99)
GLUCOSE-CAPILLARY: 213 mg/dL — AB (ref 65–99)
Glucose-Capillary: 189 mg/dL — ABNORMAL HIGH (ref 65–99)
Glucose-Capillary: 186 mg/dL — ABNORMAL HIGH (ref 65–99)
Glucose-Capillary: 230 mg/dL — ABNORMAL HIGH (ref 65–99)

## 2018-02-02 NOTE — Progress Notes (Signed)
Patient placed on CPAP by RN. Tolerating well at this time.

## 2018-02-02 NOTE — Progress Notes (Signed)
Occupational Therapy Treatment Patient Details Name: Nicholas Mejia MRN: [id number] DOB: 13-Jan-1971 Today's Date: 02/02/2018    History of present illness Pt is a 47 y.o. male s/p CABG x4 on 02/08/2018. Post-op imaging consistent with acute ischemic infarct in L PICA territory, R corona radiata, and R caudate nucleus. Also showing remote R MCA infarct (pt underwent CIR in 12/2016). PMH includes CVA, DM, HTN, CAD, ischemic cardiomyopathy, sleep apnea, morbid obesity.    OT comments  Pt with initially alert, but became somnolent despite use of multiple modalities in attempt to arouse him. Pt continues to demonstrate poor sitting balance and was did not follow commands this visit. Will continue to follow.  Follow Up Recommendations  SNF;Supervision/Assistance - 24 hour    Equipment Recommendations       Recommendations for Other Services      Precautions / Restrictions Precautions Precautions: Sternal;Fall       Mobility Bed Mobility               General bed mobility comments: pt received in chair  Transfers                 General transfer comment: tilt bed returned for repair per RN    Balance Overall balance assessment: Needs assistance   Sitting balance-Leahy Scale: Zero Sitting balance - Comments: pt demonstrating poor head control and no righting reactions when displaced in unsupported sitting                                   ADL either performed or assessed with clinical judgement   ADL                                         General ADL Comments: total A at this time     Vision   Additional Comments: pt with eyes closed most of session   Perception     Praxis      Cognition Arousal/Alertness: Lethargic Behavior During Therapy: Flat affect Overall Cognitive Status: Impaired/Different from baseline Area of Impairment: Attention;Following commands                   Current Attention Level: Focused    Following Commands: (pt not following commands)       General Comments: pt was restless and reaching for his face with L hand upon arrival, then became somnolent as therapists prepared lines and removed pillows in order to work with pt, then pt was not responsive to pain, music, movement        Exercises Exercises: General Upper Extremity General Exercises - Upper Extremity Shoulder Flexion: Both;PROM;Seated Elbow Flexion: Both;PROM;Seated Elbow Extension: Both;PROM;Seated   Shoulder Instructions       General Comments      Pertinent Vitals/ Pain       Pain Assessment: Faces Faces Pain Scale: No hurt  Home Living                                          Prior Functioning/Environment              Frequency  Min 2X/week        Progress Toward Goals  OT Goals(current goals can now  be found in the care plan section)  Progress towards OT goals: Not progressing toward goals - comment(lethargy)  Acute Rehab OT Goals Patient Stated Goal: unable to formulate goals.   OT Goal Formulation: Patient unable to participate in goal setting Time For Goal Achievement: 02/08/18 Potential to Achieve Goals: Fair  Plan Discharge plan remains appropriate    Co-evaluation    PT/OT/SLP Co-Evaluation/Treatment: Yes Reason for Co-Treatment: For patient/therapist safety   OT goals addressed during session: Strengthening/ROM      AM-PAC PT "6 Clicks" Daily Activity     Outcome Measure   Help from another person eating meals?: Total Help from another person taking care of personal grooming?: Total Help from another person toileting, which includes using toliet, bedpan, or urinal?: Total Help from another person bathing (including washing, rinsing, drying)?: Total Help from another person to put on and taking off regular upper body clothing?: Total Help from another person to put on and taking off regular lower body clothing?: Total 6 Click Score: 6     End of Session    OT Visit Diagnosis: Muscle weakness (generalized) (M62.81);Low vision, both eyes (H54.2);Other symptoms and signs involving cognitive function;Hemiplegia and hemiparesis Hemiplegia - Right/Left: Left Hemiplegia - dominant/non-dominant: Dominant Hemiplegia - caused by: Cerebral infarction   Activity Tolerance Patient limited by lethargy   Patient Left in chair;with call bell/phone within reach;with nursing/sitter in room   Nurse Communication Mobility status        Time: [id number] OT Time Calculation (min): 28 min  Charges: OT General Charges $OT Visit: 1 Visit OT Treatments $Therapeutic Activity: 8-22 mins  02/02/2018 Nicholas Mejia, OTR/L Pager: [phone]   Nicholas Mejia, Nicholas BailiffJulie Mejia 02/02/2018, 12:53 PM

## 2018-02-02 NOTE — Progress Notes (Signed)
CT surgery p.m. Rounds  Resting comfortably up in chair Tube feedings in progress Vital signs stable

## 2018-02-02 NOTE — Progress Notes (Addendum)
Physical Therapy Treatment Patient Details Name: Nicholas SparWarren Mejia MRN: [id number] DOB: 10-13-1971 Today's Date: 02/02/2018    History of Present Illness Pt is a 47 y.o. male s/p CABG x4 on 02/15/2018. Post-op imaging consistent with acute ischemic infarct in L PICA territory, R corona radiata, and R caudate nucleus. Also showing remote R MCA infarct (pt underwent CIR in 12/2016). PMH includes CVA, DM, HTN, CAD, ischemic cardiomyopathy, sleep apnea, morbid obesity.     PT Comments    Pt performed limited activity during session including scooting, repositioning and limited seated balance activities.  Pt biting posey mitt on arrival then with difficulty to keep his eyes open.  Pt seems to be declining in function.       Follow Up Recommendations  SNF;Supervision/Assistance - 24 hour     Equipment Recommendations  (TBD next venue)    Recommendations for Other Services       Precautions / Restrictions Precautions Precautions: Sternal;Fall Precaution Comments: Educated fiance, Eber JonesCarolyn, of "move in the tube" sternal precautions Restrictions Weight Bearing Restrictions: Yes(sternal precautions) Other Position/Activity Restrictions: Sternal precautions    Mobility  Bed Mobility               General bed mobility comments: pt received in chair, used lift pad to scoot in reclined position.  Transfers                    Ambulation/Gait                 Stairs            Wheelchair Mobility    Modified Rankin (Stroke Patients Only)       Balance Overall balance assessment: Needs assistance   Sitting balance-Leahy Scale: Zero Sitting balance - Comments: Pt demonstrating poor head control and no righting reactions when displaced in unsupported sitting, performed weight shifting to R and L but limited due to alertness.                                     Cognition Arousal/Alertness: Lethargic Behavior During Therapy: Flat affect Overall  Cognitive Status: Impaired/Different from baseline Area of Impairment: Attention;Following commands                   Current Attention Level: Focused   Following Commands: (Pt not following.  )   Awareness: Intellectual Problem Solving: Slow processing;Decreased initiation;Difficulty sequencing;Requires verbal cues;Requires tactile cues General Comments: pt was restless and reaching for his face with L hand upon arrival, then became somnolent as therapists prepared lines and removed pillows in order to work with pt, then pt was not responsive to pain, music, movement      Exercises      General Comments        Pertinent Vitals/Pain Pain Assessment: Faces Faces Pain Scale: No hurt    Home Living                      Prior Function            PT Goals (current goals can now be found in the care plan section) Acute Rehab PT Goals Patient Stated Goal: unable to formulate goals.   Potential to Achieve Goals: Fair Progress towards PT goals: Progressing toward goals    Frequency    Min 2X/week      PT Plan Current plan remains appropriate  Co-evaluation PT/OT/SLP Co-Evaluation/Treatment: Yes Reason for Co-Treatment: Complexity of the patient's impairments (multi-system involvement);Necessary to address cognition/behavior during functional activity;For patient/therapist safety PT goals addressed during session: Strengthening/ROM OT goals addressed during session: Strengthening/ROM      AM-PAC PT "6 Clicks" Daily Activity  Outcome Measure  Difficulty turning over in bed (including adjusting bedclothes, sheets and blankets)?: Unable Difficulty moving from lying on back to sitting on the side of the bed? : Unable Difficulty sitting down on and standing up from a chair with arms (e.g., wheelchair, bedside commode, etc,.)?: Unable Help needed moving to and from a bed to chair (including a wheelchair)?: Total Help needed walking in hospital room?:  Total Help needed climbing 3-5 steps with a railing? : Total 6 Click Score: 6    End of Session   Activity Tolerance: Patient limited by fatigue;Patient limited by lethargy Patient left: in chair;with call bell/phone within reach;with chair alarm set Nurse Communication: Mobility status PT Visit Diagnosis: Other abnormalities of gait and mobility (R26.89);Hemiplegia and hemiparesis Hemiplegia - Right/Left: Left Hemiplegia - dominant/non-dominant: Non-dominant Hemiplegia - caused by: Cerebral infarction     Time: 1041-1107 PT Time Calculation (min) (ACUTE ONLY): 26 min  Charges:  $Therapeutic Activity: 8-22 mins                    G Codes:       Joycelyn Rua, PTA pager [phone]    Florestine Avers 02/02/2018, 6:52 PM

## 2018-02-02 NOTE — Plan of Care (Signed)
Pt continues to be able to perform full ROM in the RUE and RLE. Patient responds to painful stimuli in the LUE and LLE and has non-purposeful movement in the LLE. Pt follows commands intermittently. Pt is currently receiving tube feeds via his cortrak at 70cc an hour.

## 2018-02-02 NOTE — Procedures (Signed)
Objective Swallowing Evaluation: Type of Study: FEES-Fiberoptic Endoscopic Evaluation of Swallow   Patient Details  Name: Nicholas Mejia MRN: [id number] Date of Birth: [birth year]  Today's Date: 02/02/2018 Time: SLP Start Time (ACUTE ONLY): 1420 -SLP Stop Time (ACUTE ONLY): 1440  SLP Time Calculation (min) (ACUTE ONLY): 20 min   Past Medical History:  Past Medical History:  Diagnosis Date  . CAD (coronary artery disease)   . Complication of anesthesia   . Diabetes mellitus without complication (HCC)   . Dyspnea    on exertion  . GERD (gastroesophageal reflux disease)   . Hypertension   . Ischemic cardiomyopathy   . Morbid obesity (HCC)   . Pneumonia   . Sleep apnea    pt. has not received his cpap yet  . Stroke [facility](HCC) 12/30/2016   Past Surgical History:  Past Surgical History:  Procedure Laterality Date  . CORONARY ARTERY BYPASS GRAFT N/A 02/13/2018   Procedure: CORONARY ARTERY BYPASS GRAFTING (CABG) TIMES Four,  USING LEFT INTERNAL MAMMARY ARTERY, RIGHT RADIAL ARTERY AND RIGHT SAPHENOUS LEG VEIN HARVESTED ENDOSCOPICALLY;  Surgeon: Loreli SlotHendrickson, Steven C, MD;  [facility];  Service: Open Heart Surgery;  Laterality: N/A;  . EXTERNAL EAR SURGERY  1995   right ear -removed keloid  . LEFT HEART CATH AND CORONARY ANGIOGRAPHY N/A 12/22/2017   Procedure: LEFT HEART CATH AND CORONARY ANGIOGRAPHY;  Surgeon: Elder NegusPatwardhan, Manish J, MD;  Location: [facility] INVASIVE CV LAB;  Service: Cardiovascular;  Laterality: N/A;  . RADIAL ARTERY HARVEST Right 01/24/2018   Procedure: RADIAL ARTERY HARVEST;  Surgeon: Loreli SlotHendrickson, Steven C, MD;  [facility];  Service: Open Heart Surgery;  Laterality: Right;  . TEE WITHOUT CARDIOVERSION N/A 02/05/2018   Procedure: TRANSESOPHAGEAL ECHOCARDIOGRAM (TEE);  Surgeon: Loreli SlotHendrickson, Steven C, MD;  [facility];  Service: Open Heart Surgery;  Laterality: N/A;   HPI: Nicholas Mejia is an 47 y.o. male with PMH of CAD, Diabetes Mellitus, HTN, Morbid obesity, OSA. Admitted for  CABG. Patient underwent CABG procedure on 3.1.19 and has remained lethargic since then. Pt noted to not have been moving left side and Neurology was consulted for concern of stroke. CT head showed new patchy hypodensities in the right corona radiata and right caudate nucleus consistent with acute infarcts. CTA showed severe R m1 stenosis.  Intubated for procedure only   Subjective: Pt with right gaze preference    Assessment / Plan / Recommendation  CH[facility] IP CLINICAL IMPRESSIONS 02/02/2018  Clinical Impression FEES completed. SLP checked on pt 30 minutes prior to evaulation to determine appropriateness and pt in chair, awake with family present. SLP arrived for FEES and pt able to keep eyes open for 10 seconds. Scope inserted revealing standing secretions at supraglottic level unable to clear with cues to cough. Puree texture presented with little to no oral manipuation and spilling into pyriform sinuses. SLP facilitated alertness and oral transit with max verbal and tactile stimulation and dry spoon trials unsucessfully over time. No swallow initiated and FEES scoped pulled from nasal cavity; requested RN perform NTS. Pt's alertness waxes and wanes over a very short period. SLP working with pt for eight days and today first day he exhibited potential to assess swallow with FEES. Suspect he is going to need longer term alternative nutrition. SLP will continue working with him and reassess when he is able to sustain alertness to consume 3 meals a day; continue for communication and cognition.   SLP Visit Diagnosis Dysphagia, unspecified (R13.10)  Attention and concentration deficit following Cerebral  infarction  Frontal lobe and executive function deficit following --  Impact on safety and function Severe aspiration risk      CHL IP TREATMENT RECOMMENDATION 02/02/2018  Treatment Recommendations (No Data)     Prognosis 02/02/2018  Prognosis for Safe Diet Advancement Good  Barriers to Reach Goals  Cognitive deficits  Barriers/Prognosis Comment --    CHL IP DIET RECOMMENDATION 02/02/2018  SLP Diet Recommendations NPO  Liquid Administration via --  Medication Administration Via alternative means  Compensations --  Postural Changes --      CHL IP OTHER RECOMMENDATIONS 02/02/2018  Recommended Consults --  Oral Care Recommendations Oral care QID  Other Recommendations --      CHL IP FOLLOW UP RECOMMENDATIONS 02/02/2018  Follow up Recommendations Skilled Nursing facility      [facility] IP FREQUENCY AND DURATION 02/02/2018  Speech Therapy Frequency (ACUTE ONLY) min 2x/week  Treatment Duration 2 weeks           CHL IP ORAL PHASE 02/02/2018  Oral Phase Impaired  Oral - Pudding Teaspoon --  Oral - Pudding Cup --  Oral - Honey Teaspoon --  Oral - Honey Cup --  Oral - Nectar Teaspoon --  Oral - Nectar Cup --  Oral - Nectar Straw --  Oral - Thin Teaspoon --  Oral - Thin Cup --  Oral - Thin Straw --  Oral - Puree Premature spillage  Oral - Mech Soft --  Oral - Regular --  Oral - Multi-Consistency --  Oral - Pill --  Oral Phase - Comment --    CHL IP PHARYNGEAL PHASE 02/02/2018  Pharyngeal Phase (No Data)  Pharyngeal- Pudding Teaspoon --  Pharyngeal --  Pharyngeal- Pudding Cup --  Pharyngeal --  Pharyngeal- Honey Teaspoon --  Pharyngeal --  Pharyngeal- Honey Cup --  Pharyngeal --  Pharyngeal- Nectar Teaspoon --  Pharyngeal --  Pharyngeal- Nectar Cup --  Pharyngeal --  Pharyngeal- Nectar Straw --  Pharyngeal --  Pharyngeal- Thin Teaspoon --  Pharyngeal --  Pharyngeal- Thin Cup --  Pharyngeal --  Pharyngeal- Thin Straw --  Pharyngeal --  Pharyngeal- Puree --  Pharyngeal --  Pharyngeal- Mechanical Soft --  Pharyngeal --  Pharyngeal- Regular --  Pharyngeal --  Pharyngeal- Multi-consistency --  Pharyngeal --  Pharyngeal- Pill --  Pharyngeal --  Pharyngeal Comment --     No flowsheet data found.  No flowsheet data found.  Royce Macadamia 02/02/2018,  4:20 PM  Breck Coons Lonell Face.Ed [employer] [phone]

## 2018-02-02 NOTE — Progress Notes (Signed)
11 Days Post-Op Procedure(s) (LRB): CORONARY ARTERY BYPASS GRAFTING (CABG) TIMES Four,  USING LEFT INTERNAL MAMMARY ARTERY, RIGHT RADIAL ARTERY AND RIGHT SAPHENOUS LEG VEIN HARVESTED ENDOSCOPICALLY (N/A) RADIAL ARTERY HARVEST (Right) TRANSESOPHAGEAL ECHOCARDIOGRAM (TEE) (N/A) Subjective: Answering basic questions, following commands  Objective: Vital signs in last 24 hours: Temp:  [97.9 F (36.6 C)-99.2 F (37.3 C)] 98.6 F (37 C) (03/12 0749) Pulse Rate:  [88-104] 99 (03/12 0800) Cardiac Rhythm: Normal sinus rhythm (03/12 0400) Resp:  [10-26] 21 (03/12 0800) BP: (88-153)/(52-97) 122/72 (03/12 0800) SpO2:  [91 %-100 %] 94 % (03/12 0800) Weight:  [312 lb 9.8 oz (141.8 kg)-314 lb 9.5 oz (142.7 kg)] 314 lb 9.5 oz (142.7 kg) (03/12 0229)  Hemodynamic parameters for last 24 hours:    Intake/Output from previous day: 03/11 0701 - 03/12 0700 In: 1450 [NG/GT:1450] Out: 1735 [Urine:1735] Intake/Output this shift: No intake/output data recorded.  General appearance: cooperative and no distress Neurologic: left sided weakness Heart: regular rate and rhythm Lungs: diminished breath sounds bibasilar Abdomen: normal findings: soft, non-tender Wound: clean and dry  Lab Results: No results for input(s): WBC, HGB, HCT, PLT in the last 72 hours. BMET:  Recent Labs    01/30/18 0921  NA 148*  K 4.3  CL 110  CO2 28  GLUCOSE 228*  BUN 38*  CREATININE 1.16  CALCIUM 8.2*    PT/INR: No results for input(s): LABPROT, INR in the last 72 hours. ABG    Component Value Date/Time   PHART 7.345 (L) 01/26/2018 1048   HCO3 25.8 01/26/2018 1048   TCO2 27 01/26/2018 1048   ACIDBASEDEF 3.0 (H) 01/23/2018 0846   O2SAT 96.0 01/26/2018 1048   CBG (last 3)  Recent Labs    02/01/18 2005 02/02/18 0157 02/02/18 0742  GLUCAP 212* 189* 186*    Assessment/Plan: S/P Procedure(s) (LRB): CORONARY ARTERY BYPASS GRAFTING (CABG) TIMES Four,  USING LEFT INTERNAL MAMMARY ARTERY, RIGHT RADIAL ARTERY  AND RIGHT SAPHENOUS LEG VEIN HARVESTED ENDOSCOPICALLY (N/A) RADIAL ARTERY HARVEST (Right) TRANSESOPHAGEAL ECHOCARDIOGRAM (TEE) (N/A) -NEURO- s/p CVA. No significant change in exam  CV- stable  RESP- basilar atelectasis  RENAL- creatinine normal- recheck again in AM  Will give voiding trial today  ENDO- CBG elevated with TF- on levemir + SSI  Nutrition- on TF, for FEES when able   LOS: 11 days    Loreli SlotSteven C Hendrickson 02/02/2018

## 2018-02-02 NOTE — Progress Notes (Signed)
Current SNF offers provided  CSW will continue to follow  Burna SisJenna H. Bueford Arp, LCSW Clinical Social Worker [phone]

## 2018-02-02 NOTE — Progress Notes (Signed)
  Speech Language Pathology   Patient Details Name: Nicholas Mejia Kempa MRN: [id number] DOB: 20-Mar-1971 Today's Date: 02/02/2018 Time:  -     FEES swallow assessment scheduled 2:00 today   Breck CoonsLisa Willis Jesse Nosbisch M.Ed [employer]SLP Pager [phone]

## 2018-02-03 ENCOUNTER — Inpatient Hospital Stay (HOSPITAL_COMMUNITY): Payer: BLUE CROSS/BLUE SHIELD

## 2018-02-03 LAB — POCT I-STAT 3, ART BLOOD GAS (G3+)
ACID-BASE EXCESS: 7 mmol/L — AB (ref 0.0–2.0)
Acid-Base Excess: 6 mmol/L — ABNORMAL HIGH (ref 0.0–2.0)
Bicarbonate: 31.6 mmol/L — ABNORMAL HIGH (ref 20.0–28.0)
Bicarbonate: 33.3 mmol/L — ABNORMAL HIGH (ref 20.0–28.0)
O2 SAT: 71 %
O2 SAT: 96 %
PCO2 ART: 51.5 mmHg — AB (ref 32.0–48.0)
PCO2 ART: 58.9 mmHg — AB (ref 32.0–48.0)
Patient temperature: 101
Patient temperature: 101
TCO2: 33 mmol/L — AB (ref 22–32)
TCO2: 35 mmol/L — AB (ref 22–32)
pH, Arterial: 7.366 (ref 7.350–7.450)
pH, Arterial: 7.402 (ref 7.350–7.450)
pO2, Arterial: 43 mmHg — ABNORMAL LOW (ref 83.0–108.0)
pO2, Arterial: 92 mmHg (ref 83.0–108.0)

## 2018-02-03 LAB — URINALYSIS, COMPLETE (UACMP) WITH MICROSCOPIC
Bilirubin Urine: NEGATIVE
GLUCOSE, UA: NEGATIVE mg/dL
KETONES UR: NEGATIVE mg/dL
Nitrite: NEGATIVE
Protein, ur: NEGATIVE mg/dL
Specific Gravity, Urine: 1.019 (ref 1.005–1.030)
pH: 5 (ref 5.0–8.0)

## 2018-02-03 LAB — GLUCOSE, CAPILLARY
GLUCOSE-CAPILLARY: 161 mg/dL — AB (ref 65–99)
Glucose-Capillary: 156 mg/dL — ABNORMAL HIGH (ref 65–99)
Glucose-Capillary: 188 mg/dL — ABNORMAL HIGH (ref 65–99)
Glucose-Capillary: 214 mg/dL — ABNORMAL HIGH (ref 65–99)
Glucose-Capillary: 189 mg/dL — ABNORMAL HIGH (ref 65–99)
Glucose-Capillary: 215 mg/dL — ABNORMAL HIGH (ref 65–99)

## 2018-02-03 LAB — BASIC METABOLIC PANEL
ANION GAP: 9 (ref 5–15)
BUN: 52 mg/dL — ABNORMAL HIGH (ref 6–20)
CO2: 31 mmol/L (ref 22–32)
Calcium: 8.3 mg/dL — ABNORMAL LOW (ref 8.9–10.3)
Chloride: 116 mmol/L — ABNORMAL HIGH (ref 101–111)
Creatinine, Ser: 1.48 mg/dL — ABNORMAL HIGH (ref 0.61–1.24)
GFR calc non Af Amer: 54 mL/min — ABNORMAL LOW (ref >60)
Glucose, Bld: 166 mg/dL — ABNORMAL HIGH (ref 65–99)
POTASSIUM: 4.6 mmol/L (ref 3.5–5.1)
Sodium: 156 mmol/L — ABNORMAL HIGH (ref 135–145)

## 2018-02-03 LAB — CBC
HEMATOCRIT: 31.9 % — AB (ref 39.0–52.0)
HEMOGLOBIN: 9.1 g/dL — AB (ref 13.0–17.0)
MCH: 28.9 pg (ref 26.0–34.0)
MCHC: 28.5 g/dL — ABNORMAL LOW (ref 30.0–36.0)
MCV: 101.3 fL — AB (ref 78.0–100.0)
Platelets: 420 10*3/uL — ABNORMAL HIGH (ref 150–400)
RBC: 3.15 MIL/uL — AB (ref 4.22–5.81)
RDW: 15.6 % — ABNORMAL HIGH (ref 11.5–15.5)
WBC: 15.7 10*3/uL — AB (ref 4.0–10.5)

## 2018-02-03 MED ORDER — VANCOMYCIN HCL 10 G IV SOLR
1250.0000 mg | Freq: Two times a day (BID) | INTRAVENOUS | Status: DC
  Administered 2018-02-03 – 2018-02-09 (×12): 1250 mg via INTRAVENOUS
  Filled 2018-02-03 (×13): qty 1250

## 2018-02-03 MED ORDER — CHLORHEXIDINE GLUCONATE 0.12 % MT SOLN: 15.0000 mL | Freq: Two times a day (BID) | OROMUCOSAL | Status: DC

## 2018-02-03 MED ORDER — FREE WATER
200.0000 mL | Freq: Four times a day (QID) | Status: DC
  Administered 2018-02-03 – 2018-02-06 (×13): 200 mL

## 2018-02-03 MED ORDER — INSULIN DETEMIR 100 UNIT/ML ~~LOC~~ SOLN
45.0000 [IU] | Freq: Two times a day (BID) | SUBCUTANEOUS | Status: DC
  Administered 2018-02-03 – 2018-02-05 (×6): 45 [IU] via SUBCUTANEOUS
  Filled 2018-02-03 (×7): qty 0.45

## 2018-02-03 MED ORDER — DOPAMINE-DEXTROSE 3.2-5 MG/ML-% IV SOLN
3.0000 ug/kg/min | INTRAVENOUS | Status: DC
  Administered 2018-02-03: 3 ug/kg/min via INTRAVENOUS
  Administered 2018-02-05 – 2018-02-07 (×3): 5 ug/kg/min via INTRAVENOUS
  Administered 2018-02-08 – 2018-02-13 (×5): 3 ug/kg/min via INTRAVENOUS
  Filled 2018-02-03 (×11): qty 250

## 2018-02-03 MED ORDER — CHLORHEXIDINE GLUCONATE 0.12 % MT SOLN
15.0000 mL | Freq: Two times a day (BID) | OROMUCOSAL | Status: DC
  Administered 2018-02-03 (×2): 15 mL via OROMUCOSAL
  Filled 2018-02-03 (×2): qty 15

## 2018-02-03 MED ORDER — ALBUMIN HUMAN 5 % IV SOLN
25.0000 g | Freq: Once | INTRAVENOUS | Status: AC
  Administered 2018-02-03: 25 g via INTRAVENOUS

## 2018-02-03 MED ORDER — CEFEPIME HCL 1 G IJ SOLR
1.0000 g | Freq: Three times a day (TID) | INTRAMUSCULAR | Status: DC
  Administered 2018-02-03 – 2018-02-11 (×24): 1 g via INTRAVENOUS
  Filled 2018-02-03 (×25): qty 1

## 2018-02-03 MED ORDER — ORAL CARE MOUTH RINSE
15.0000 mL | Freq: Four times a day (QID) | OROMUCOSAL | Status: DC
  Administered 2018-02-03 – 2018-02-04 (×6): 15 mL via OROMUCOSAL

## 2018-02-03 NOTE — Progress Notes (Signed)
RT stuck pt for ABG. First result was venous and the second try was Arterial. RN aware. RT to continue to monitor as needed.

## 2018-02-03 NOTE — Progress Notes (Signed)
12 Days Post-Op Procedure(s) (LRB): CORONARY ARTERY BYPASS GRAFTING (CABG) TIMES Four,  USING LEFT INTERNAL MAMMARY ARTERY, RIGHT RADIAL ARTERY AND RIGHT SAPHENOUS LEG VEIN HARVESTED ENDOSCOPICALLY (N/A) RADIAL ARTERY HARVEST (Right) TRANSESOPHAGEAL ECHOCARDIOGRAM (TEE) (N/A) Subjective: Opens eyes to voice, follows simple commands  Objective: Vital signs in last 24 hours: Temp:  [97.7 F (36.5 C)-100.5 F (38.1 C)] 98.4 F (36.9 C) (03/13 0745) Pulse Rate:  [87-101] 99 (03/13 0700) Cardiac Rhythm: Normal sinus rhythm;Sinus tachycardia (03/13 0400) Resp:  [0-26] 26 (03/13 0700) BP: (88-148)/(32-113) 92/48 (03/13 0600) SpO2:  [86 %-100 %] 97 % (03/13 0700) Weight:  [309 lb 4.9 oz (140.3 kg)] 309 lb 4.9 oz (140.3 kg) (03/13 0600)  Hemodynamic parameters for last 24 hours:    Intake/Output from previous day: 03/12 0701 - 03/13 0700 In: 1770 [NG/GT:1770] Out: 1075 [Urine:1075] Intake/Output this shift: No intake/output data recorded.  General appearance: cooperative, no distress and slowed mentation Neurologic: left sided weakness Heart: regular rate and rhythm Lungs: diminished breath sounds bibasilar Wound: clean and dry  Lab Results: Recent Labs    02/03/18 0419  WBC 15.7*  HGB 9.1*  HCT 31.9*  PLT 420*   BMET:  Recent Labs    02/03/18 0419  NA 156*  K 4.6  CL 116*  CO2 31  GLUCOSE 166*  BUN 52*  CREATININE 1.48*  CALCIUM 8.3*    PT/INR: No results for input(s): LABPROT, INR in the last 72 hours. ABG    Component Value Date/Time   PHART 7.345 (L) 01/26/2018 1048   HCO3 25.8 01/26/2018 1048   TCO2 27 01/26/2018 1048   ACIDBASEDEF 3.0 (H) 01/23/2018 0846   O2SAT 96.0 01/26/2018 1048   CBG (last 3)  Recent Labs    02/02/18 2127 02/03/18 0358 02/03/18 0741  GLUCAP 213* 156* 161*    Assessment/Plan: S/P Procedure(s) (LRB): CORONARY ARTERY BYPASS GRAFTING (CABG) TIMES Four,  USING LEFT INTERNAL MAMMARY ARTERY, RIGHT RADIAL ARTERY AND RIGHT  SAPHENOUS LEG VEIN HARVESTED ENDOSCOPICALLY (N/A) RADIAL ARTERY HARVEST (Right) TRANSESOPHAGEAL ECHOCARDIOGRAM (TEE) (N/A) -CV- In SR   BP relatively low- will hold irbesartan for now  RESP- LLL atelectasis on CXR  RENAL- creatinine up- hold irbesartan  Hypernatremia- sodium up significantly with TF  Add free water  ENDO- CBG elevated, increase levemir  NEURO- stroke, left hemiparesis.   High aspiration risk- continue TF   LOS: 12 days    Loreli SlotSteven C Hendrickson 02/03/2018

## 2018-02-03 NOTE — Progress Notes (Signed)
Noted change from baseline in pt's neuro assessment.  MD notified and Head CT w/o contrast preformed.  MD notified of results.  Neurology to be re-consulted.   Frutoso ChaseKristen M Riko Lumsden, RN

## 2018-02-03 NOTE — Progress Notes (Signed)
Pharmacy Antibiotic Note  Nicholas Mejia is a 47 y.o. male admitted on 02/13/2018 with sepsis.  Pharmacy has been consulted for vancomycin and cefepime dosing.  Pt now febrile, WBC remains elevated.  Plan: 1. Vancomycin 1250 mg IV q 12  Hrs.  Watch renal function, may need dose increase if improving. 2. Cefepime 1g IV q 8 hrs 3. F/u cultures and clinical course. 4. Vancomycin trough at steady state as indicated.  Height: 6' 2.02" (188 cm) Weight: (!) 309 lb 4.9 oz (140.3 kg) IBW/kg (Calculated) : 82.24  Temp (24hrs), Avg:100.1 F (37.8 C), Min:98.4 F (36.9 C), Max:102.6 F (39.2 C)  Recent Labs  Lab 01/28/18 0247 01/29/18 0511 01/30/18 0526 01/30/18 0921 02/03/18 0419  WBC 15.2* 16.8*  --   --  15.7*  CREATININE 1.14 1.09 1.11 1.16 1.48*    Estimated Creatinine Clearance: 91 mL/min (A) (by C-G formula based on SCr of 1.48 mg/dL (H)).    Allergies  Allergen Reactions  . Bee Venom Anaphylaxis and Swelling  . Metoprolol Shortness Of Breath  . Penicillins Swelling    Swelling in feet Has patient had a PCN reaction causing immediate rash, facial/tongue/throat swelling, SOB or lightheadedness with hypotension: No Has patient had a PCN reaction causing severe rash involving mucus membranes or skin necrosis: No Has patient had a PCN reaction that required hospitalization: No Has patient had a PCN reaction occurring within the last 10 years: No If all of the above answers are "NO", then may proceed with Cephalosporin use.  . Pollen Extract     UNSPECIFIED REACTION     Antimicrobials this admission:  Vanc 3/13 >  Zosyn 3/13 Levaquin 3/1> 3/2 (periop)  Dose adjustments this admission:   Microbiology results:  3/13 BCx x 2: 3/13 UCx:   MRSA PCR:   Thank you for allowing pharmacy to be a part of this patient's care.  Tad MooreJessica Farhad Burleson, Pharm D, BCPS  Clinical Pharmacist Pager [phone]  02/03/2018 1:38 PM

## 2018-02-03 NOTE — Progress Notes (Signed)
TCTS BRIEF SICU PROGRESS NOTE  12 Days Post-Op  S/P Procedure(s) (LRB): CORONARY ARTERY BYPASS GRAFTING (CABG) TIMES Four,  USING LEFT INTERNAL MAMMARY ARTERY, RIGHT RADIAL ARTERY AND RIGHT SAPHENOUS LEG VEIN HARVESTED ENDOSCOPICALLY (N/A) RADIAL ARTERY HARVEST (Right) TRANSESOPHAGEAL ECHOCARDIOGRAM (TEE) (N/A)   Reportedly much less responsive today Currently unresponsive to verbal stimuli and minimally responsive to painful stimuli No purposeful movements Pupils equal, reactive ABG okay  Plan: Will check f/u CT head  Purcell Nailslarence H Owen, MD 02/03/2018 8:25 PM

## 2018-02-04 ENCOUNTER — Inpatient Hospital Stay (HOSPITAL_COMMUNITY): Payer: BLUE CROSS/BLUE SHIELD | Admitting: Certified Registered"

## 2018-02-04 ENCOUNTER — Inpatient Hospital Stay (HOSPITAL_COMMUNITY): Payer: BLUE CROSS/BLUE SHIELD

## 2018-02-04 DIAGNOSIS — I6523 Occlusion and stenosis of bilateral carotid arteries: Secondary | ICD-10-CM

## 2018-02-04 DIAGNOSIS — Z951 Presence of aortocoronary bypass graft: Secondary | ICD-10-CM

## 2018-02-04 DIAGNOSIS — I63411 Cerebral infarction due to embolism of right middle cerebral artery: Secondary | ICD-10-CM

## 2018-02-04 DIAGNOSIS — G9340 Encephalopathy, unspecified: Secondary | ICD-10-CM

## 2018-02-04 DIAGNOSIS — I63233 Cerebral infarction due to unspecified occlusion or stenosis of bilateral carotid arteries: Secondary | ICD-10-CM

## 2018-02-04 DIAGNOSIS — R6521 Severe sepsis with septic shock: Secondary | ICD-10-CM

## 2018-02-04 DIAGNOSIS — J9602 Acute respiratory failure with hypercapnia: Secondary | ICD-10-CM

## 2018-02-04 DIAGNOSIS — A419 Sepsis, unspecified organism: Secondary | ICD-10-CM

## 2018-02-04 DIAGNOSIS — L8990 Pressure ulcer of unspecified site, unspecified stage: Secondary | ICD-10-CM

## 2018-02-04 DIAGNOSIS — R400 Somnolence: Secondary | ICD-10-CM

## 2018-02-04 LAB — COMPREHENSIVE METABOLIC PANEL
ALT: 73 U/L — ABNORMAL HIGH (ref 17–63)
AST: 161 U/L — AB (ref 15–41)
Albumin: 2.1 g/dL — ABNORMAL LOW (ref 3.5–5.0)
Alkaline Phosphatase: 49 U/L (ref 38–126)
Anion gap: 7 (ref 5–15)
BILIRUBIN TOTAL: 1.4 mg/dL — AB (ref 0.3–1.2)
BUN: 65 mg/dL — AB (ref 6–20)
CO2: 26 mmol/L (ref 22–32)
Calcium: 6.6 mg/dL — ABNORMAL LOW (ref 8.9–10.3)
Chloride: 118 mmol/L — ABNORMAL HIGH (ref 101–111)
Creatinine, Ser: 1.71 mg/dL — ABNORMAL HIGH (ref 0.61–1.24)
GFR calc Af Amer: 53 mL/min — ABNORMAL LOW (ref >60)
GFR, EST NON AFRICAN AMERICAN: 46 mL/min — AB (ref >60)
Glucose, Bld: 241 mg/dL — ABNORMAL HIGH (ref 65–99)
POTASSIUM: 3.6 mmol/L (ref 3.5–5.1)
Sodium: 151 mmol/L — ABNORMAL HIGH (ref 135–145)
TOTAL PROTEIN: 5.7 g/dL — AB (ref 6.5–8.1)

## 2018-02-04 LAB — CBC WITH DIFFERENTIAL/PLATELET
BASOS ABS: 0 10*3/uL (ref 0.0–0.1)
Basophils Relative: 0 %
Eosinophils Absolute: 0.1 10*3/uL (ref 0.0–0.7)
Eosinophils Relative: 1 %
HEMATOCRIT: 26.4 % — AB (ref 39.0–52.0)
Hemoglobin: 7.5 g/dL — ABNORMAL LOW (ref 13.0–17.0)
LYMPHS ABS: 1.8 10*3/uL (ref 0.7–4.0)
LYMPHS PCT: 9 %
MCH: 28.5 pg (ref 26.0–34.0)
MCHC: 28.4 g/dL — ABNORMAL LOW (ref 30.0–36.0)
MCV: 100.4 fL — AB (ref 78.0–100.0)
MONO ABS: 1.7 10*3/uL — AB (ref 0.1–1.0)
Monocytes Relative: 8 %
NEUTROS ABS: 16.7 10*3/uL — AB (ref 1.7–7.7)
Neutrophils Relative %: 82 %
Platelets: 348 10*3/uL (ref 150–400)
RBC: 2.63 MIL/uL — ABNORMAL LOW (ref 4.22–5.81)
RDW: 15.3 % (ref 11.5–15.5)
WBC: 20.3 10*3/uL — ABNORMAL HIGH (ref 4.0–10.5)

## 2018-02-04 LAB — POCT I-STAT 3, ART BLOOD GAS (G3+)
ACID-BASE EXCESS: 7 mmol/L — AB (ref 0.0–2.0)
Bicarbonate: 32.6 mmol/L — ABNORMAL HIGH (ref 20.0–28.0)
O2 SAT: 98 %
PCO2 ART: 56.7 mmHg — AB (ref 32.0–48.0)
PH ART: 7.375 (ref 7.350–7.450)
PO2 ART: 118 mmHg — AB (ref 83.0–108.0)
Patient temperature: 101.4
TCO2: 34 mmol/L — ABNORMAL HIGH (ref 22–32)

## 2018-02-04 LAB — GLUCOSE, CAPILLARY
GLUCOSE-CAPILLARY: 159 mg/dL — AB (ref 65–99)
GLUCOSE-CAPILLARY: 221 mg/dL — AB (ref 65–99)
GLUCOSE-CAPILLARY: 224 mg/dL — AB (ref 65–99)
GLUCOSE-CAPILLARY: 238 mg/dL — AB (ref 65–99)
Glucose-Capillary: 129 mg/dL — ABNORMAL HIGH (ref 65–99)

## 2018-02-04 LAB — URINE CULTURE: Culture: NO GROWTH

## 2018-02-04 LAB — AMMONIA: Ammonia: 16 umol/L (ref 9–35)

## 2018-02-04 LAB — PHOSPHORUS: Phosphorus: 4 mg/dL (ref 2.5–4.6)

## 2018-02-04 LAB — TSH: TSH: 0.303 u[IU]/mL — ABNORMAL LOW (ref 0.350–4.500)

## 2018-02-04 LAB — MAGNESIUM: MAGNESIUM: 2.7 mg/dL — AB (ref 1.7–2.4)

## 2018-02-04 MED ORDER — MIDAZOLAM HCL 2 MG/2ML IJ SOLN: 2.0000 mg | INTRAMUSCULAR | Status: DC | PRN

## 2018-02-04 MED ORDER — SODIUM CHLORIDE 0.9 % IV SOLN
0.0000 ug/min | INTRAVENOUS | Status: DC
  Administered 2018-02-04: 10 ug/min via INTRAVENOUS
  Filled 2018-02-04: qty 1

## 2018-02-04 MED ORDER — PROPOFOL 10 MG/ML IV BOLUS
INTRAVENOUS | Status: DC | PRN
  Administered 2018-02-04: 50 mg via INTRAVENOUS

## 2018-02-04 MED ORDER — ORAL CARE MOUTH RINSE
15.0000 mL | OROMUCOSAL | Status: DC
  Administered 2018-02-04 – 2018-02-14 (×101): 15 mL via OROMUCOSAL

## 2018-02-04 MED ORDER — PRO-STAT SUGAR FREE PO LIQD
60.0000 mL | Freq: Four times a day (QID) | ORAL | Status: DC
  Administered 2018-02-04 – 2018-02-13 (×38): 60 mL
  Filled 2018-02-04 (×33): qty 60

## 2018-02-04 MED ORDER — IOPAMIDOL (ISOVUE-370) INJECTION 76%
INTRAVENOUS | Status: AC
  Administered 2018-02-04: 50 mL via INTRAVENOUS
  Filled 2018-02-04: qty 50

## 2018-02-04 MED ORDER — PHENYLEPHRINE HCL 10 MG/ML IJ SOLN
0.0000 ug/min | INTRAMUSCULAR | Status: DC
Start: 2018-02-04 — End: 2018-02-04
  Administered 2018-02-04: 120 ug/min via INTRAVENOUS
  Administered 2018-02-04 (×3): 160 ug/min via INTRAVENOUS
  Filled 2018-02-04 (×3): qty 40
  Filled 2018-02-04: qty 4

## 2018-02-04 MED ORDER — CHLORHEXIDINE GLUCONATE 0.12% ORAL RINSE (MEDLINE KIT)
15.0000 mL | Freq: Two times a day (BID) | OROMUCOSAL | Status: DC
  Administered 2018-02-04 – 2018-02-14 (×22): 15 mL via OROMUCOSAL

## 2018-02-04 MED ORDER — JEVITY 1.2 CAL PO LIQD
1000.0000 mL | ORAL | Status: DC
  Administered 2018-02-04 – 2018-02-13 (×7): 1000 mL
  Filled 2018-02-04 (×11): qty 1000

## 2018-02-04 MED ORDER — SODIUM CHLORIDE 0.9 % IV SOLN
0.0000 ug/min | INTRAVENOUS | Status: DC
  Administered 2018-02-04: 180 ug/min via INTRAVENOUS
  Administered 2018-02-05: 220 ug/min via INTRAVENOUS
  Administered 2018-02-05: 200 ug/min via INTRAVENOUS
  Administered 2018-02-05: 210 ug/min via INTRAVENOUS
  Administered 2018-02-06: 160 ug/min via INTRAVENOUS
  Administered 2018-02-06 (×2): 140 ug/min via INTRAVENOUS
  Administered 2018-02-07: 100 ug/min via INTRAVENOUS
  Administered 2018-02-08: 65 ug/min via INTRAVENOUS
  Administered 2018-02-08: 100 ug/min via INTRAVENOUS
  Administered 2018-02-09: 55 ug/min via INTRAVENOUS
  Filled 2018-02-04 (×4): qty 8
  Filled 2018-02-04: qty 3
  Filled 2018-02-04 (×3): qty 8

## 2018-02-04 MED ORDER — SUCCINYLCHOLINE CHLORIDE 20 MG/ML IJ SOLN
INTRAMUSCULAR | Status: DC | PRN
  Administered 2018-02-04: 100 mg via INTRAVENOUS

## 2018-02-04 MED ORDER — FENTANYL BOLUS VIA INFUSION
50.0000 ug | INTRAVENOUS | Status: DC | PRN
  Filled 2018-02-04: qty 50

## 2018-02-04 MED ORDER — FENTANYL 2500MCG IN NS 250ML (10MCG/ML) PREMIX INFUSION: 25.0000 ug/h | INTRAVENOUS | Status: DC

## 2018-02-04 MED ORDER — ACETAMINOPHEN 160 MG/5ML PO SOLN
650.0000 mg | Freq: Four times a day (QID) | ORAL | Status: DC | PRN
  Administered 2018-02-04 – 2018-02-09 (×12): 650 mg
  Filled 2018-02-04 (×11): qty 20.3

## 2018-02-04 MED ORDER — FENTANYL CITRATE (PF) 100 MCG/2ML IJ SOLN: 50.0000 ug | Freq: Once | INTRAMUSCULAR | Status: DC

## 2018-02-04 NOTE — Progress Notes (Signed)
Nutrition Follow-up  DOCUMENTATION CODES:   Morbid obesity  INTERVENTION:   Jevity 1.2 @ 40 mL/hr  60 ml prostat QID  Provides 1952 kcal, 173 g protein, and 774 mL free water. Meets 100% of protein and calorie needs.   NUTRITION DIAGNOSIS:   Inadequate oral intake related to inability to eat as evidenced by NPO status.  Ongoing  GOAL:   Patient will meet greater than or equal to 90% of their needs  Meeting with TF  MONITOR:   Diet advancement, Skin, Weight trends, I & O's, Labs  REASON FOR ASSESSMENT:   Consult Enteral/tube feeding initiation and management  ASSESSMENT:   47 y.o.malewith PMH of CAD, DM, and HTN. S/P CABG x4 and perioperative CVA.   3/1 CABG x4; pt extubated 3/4 Cortrak, gastric placement 3/11 Pt's cortrak replaced, this is patient's 4th cortrak tube 3/12- FEEs, SLP recommends NPO as pt is severe aspiration risk 3/13- re-intubated for unresponsiveness  Pt on one pressor at this time. Continues with Jevity 1.2 @ 50 ml/hr with 30 ml Prostat QID. Nutrition needs adjusted based on ASPEN guidelines. RD to order tube feeding rate to comply with new needs.   Patient is currently intubated on ventilator support MV: 10.8 L/min Temp (24hrs), Avg:100 F (37.8 C), Min:98.4 F (36.9 C), Max:101.4 F (38.6 C) BP: 123/63 MAP: 79  I/O: -2609 ml since admit UOP: 1100 ml this morning Weight noted to decrease by 7 lb since last RD visit 3/11 (312 lb to 305 lb)  Medications reviewed and include: fentanyl, Levemir, neosynephrine Labs reviewed: Na 151 (H) CBG 241 (H) Mg 2.7 (H) AST 161 (H) ALT 73 (H)  Diet Order:  Fall precautions Diet NPO time specified  EDUCATION NEEDS:   Not appropriate for education at this time  Skin:  Skin Assessment: Skin Integrity Issues: Skin Integrity Issues:: Incisions Incisions: R arm, R leg, chest  Last BM:  3/9 medium  Height:   Ht Readings from Last 1 Encounters:  02/01/18 6' 2.02" (1.88 m)    Weight:   Wt  Readings from Last 1 Encounters:  02/04/18 (!) 305 lb 16 oz (138.8 kg)    Ideal Body Weight:  86.36 kg  BMI:  Body mass index is 39.27 kg/m.  Estimated Nutritional Needs:   Kcal:  1610-96041527-1943 kcal/day  Protein:  >172 g/day  Fluid:  1.5 L/day   Vanessa Kickarly Decklyn Hornik RD, LDN Clinical Nutrition Pager # - [phone]

## 2018-02-04 NOTE — Progress Notes (Addendum)
      [address]       [place] [id number]             [phone]      Intubated, sedated  BP 123/67 (BP Location: Right Arm)   Pulse 94   Temp (!) 102 F (38.9 C) (Axillary)   Resp 17   Ht 6' 2.02" (1.88 m)   Wt (!) 305 lb 16 oz (138.8 kg)   SpO2 100%   BMI 39.27 kg/m   Intake/Output Summary (Last 24 hours) at 02/04/2018 1805 Last data filed at 02/04/2018 1749 Gross per 24 hour  Intake 3626.13 ml  Output 4205 ml  Net -578.87 ml   Neurology noted new interval infarctions on head CT done last night Neuro status unchanged  Tmax 102 this afternoon-  On Vanco and cefepime empirically for presumed pneumonia  Viviann SpareSteven C. Dorris FetchHendrickson, MD [facility] [phone]

## 2018-02-04 NOTE — Progress Notes (Signed)
PT Cancellation Note  Patient Details Name: Nicholas Mejia MRN: [id number] DOB: 1971-09-16   Cancelled Treatment:    Reason Eval/Treat Not Completed: Medical issues which prohibited therapy(Pt with medical decline and MD discontinued PT.) Please reorder if pt status changes.  Thanks.    Nicholas Mejia 02/04/2018, 10:05 AM Eber Jonesawn Sharnee Douglass,PT Acute Rehabilitation [phone] (pager)

## 2018-02-04 NOTE — Progress Notes (Signed)
EEG complete. Results pending.  ?

## 2018-02-04 NOTE — Progress Notes (Signed)
Will be consulted by patient's nurse on 10:45 PM stating that she noticed a decline in neurological status upon start of her shift and comparison to yesterday.   History :  [name] is a 47 year old male status post CABG postop day 12 who was seen by neurology on 03/02/2019after developing left-sided weakness following his surgery. 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 Etiology of stroke was thought to be watershed due to preoperative hypotension in the setting of severe ICAD.  Neurology signed off with recommendations to keep blood pressure between 120 and 180 systolic, continue aspirin and PT/OT.   Patient continued to improve,  however yesterday became febrile due to sepsis and subsequently hypotensive today requiring pressors. The nurse on call tonight stated that patient was following commands an on leaving or shift 7 in the morning, however on returning back at 7 PM noted patient was nonverbal and unresponsive. After discussing the CT surgery, which is the patient's primary team a head CT was ordered which showed new infarcts compared to prior CT done on 01/23/18 and  01/25/18. Official read stated. " Increasing or new regions of hypoattenuation scattered throughoutright MCA distribution white matter, involving body and splenium of corpus callosum, and in the left lateral cerebellar hemisphereadjacent to prior PICA infarction likely representing interval areas of acute infarction."     Exam:  Patient laying comfortably, does not appear to be in respiratory distress.   Patient is nonverbal, opens eyes to sternal rub and does not follow any commands. Ocular bobbing was noted on exam, as well as mild gaze deviation to the right. Pupils were about 3 mm bilaterally and equally responsive. Gag reflex was absent. The patient withdrew minimally in all 4 extremities, there was no significant difference withdrawal on the right side  compared left side  (patient would spontaneously move right arm and leg previously). Tone was normal on both sides. Reflexes were 1+ bilaterally.  Impression 47 y.o. 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 Also, differential includes subclinical seizures but less likely.  Etiology: likely caused by hypertension in the setting of sepsis.    NIHSS : 38  Plan: Repeat CTA head to rule out basilar thrombus given patient has ocular bobbing, lethargic and quadriparetic raising concern for large posterior circulation stroke. Urgent PCCM consult for intubation for airway protection. R EEG to r/o subclinical seizures MRI brain for prognostication when available.

## 2018-02-04 NOTE — Transfer of Care (Signed)
OOD intubation 

## 2018-02-04 NOTE — Procedures (Signed)
HPI:  47 y/o with MS change after CABG  TECHNICAL SUMMARY:  A multichannel referential and bipolar montage EEG using the standard international 10-20 system was performed on the patient.  There was a 7 Hz activity noted posterior on the left.  5-7 Hz activity was noted on the right.  There is decreased amplitude on the right compared to that of the left hemisphere.  There is decreased beta in the right hemisphere compared to that of the left.    ACTIVATION:  Stepwise photic stimulation and hyperventilation are not performed.  EPILEPTIFORM ACTIVITY:  There were no spikes, sharp waves or paroxysmal activity.  SLEEP: No physiologic sleep is noted.  IMPRESSION:  This is an abnormal EEG for the patient's stated age demonstrating:  1.  A diffuse slowing of the electrocerebral activity.  This can be seen in a wide variety of encephalopathic states including those of a toxic, metabolic, or degenerative nature.  2.  Focal slowing and decreased amplitude in the right hemisphere compared to that of the left.  If not already done so, a structural lesion in this region should be ruled out.  3.  No evidence of any epileptiform activity on this recording.  Correlate clinically.

## 2018-02-04 NOTE — Progress Notes (Signed)
Inpatient Diabetes Program Recommendations  AACE/ADA: New Consensus Statement on Inpatient Glycemic Control (2015)  Target Ranges:  Prepandial:   less than 140 mg/dL      Peak postprandial:   less than 180 mg/dL (1-2 hours)      Critically ill patients:  140 - 180 mg/dL   Lab Results  Component Value Date   GLUCAP 129 (H) 02/04/2018   HGBA1C 7.2 (H) 01/24/2018    Review of Glycemic ControlResults for Nicholas Mejia, Dream (MRN [id number]) as of 02/04/2018 10:25  Ref. Range 02/03/2018 11:29 02/03/2018 15:30 02/03/2018 19:54 02/04/2018 02:36 02/04/2018 07:42  Glucose-Capillary Latest Ref Range: 65 - 99 mg/dL 914214 (H) 782189 (H) 956215 (H) 159 (H) 129 (H)   Diabetes history: Type 2 DM Outpatient Diabetes medications: Metformin 500 mg bid,  Current orders for Inpatient glycemic control:  TCTS q 6 hours, Levemir 45 units bid Inpatient Diabetes Program Recommendations:    May consider increasing Novolog correction to q 4 hours.   Thanks,  Beryl MeagerJenny Rashawn Rayman, RN, BC-ADM Inpatient Diabetes Coordinator Pager [phone] (8a-5p)

## 2018-02-04 NOTE — Consult Note (Signed)
PULMONARY / CRITICAL CARE MEDICINE   Name: Nicholas Mejia MRN: [id number] DOB: 1971-06-08    ADMISSION DATE:  2018-02-01 CONSULTATION DATE:  3/14  REFERRING MD:  Dr. Dorris Fetch, Dr. Laurence Slate  CHIEF COMPLAINT:  Obtundation, CVA  HISTORY OF PRESENT ILLNESS:   47 year old male with PMH as below, which is significant for CVA, CAD, HTN, and OSA, and morbid obesity. In December 2018 he developed some SOB with exertion and peripheral edema. Cardiac workup included a left heart catheterization, which discovered three vessel CAD and an ejection fraction of 40%. He was referred to cardiothoracic surgery which prompted admission to [facility] 3/1 for elective CABG.  The procedure and immediate postoperative course were without complication. 3/2 he developed some lethargy, with L sided weakness (has this chronically as well from prior stroke). Neuroimaging demonstrated new L PICA infarct and Right subcortical MCA infarcts, thought to be secondary to intraoperative hypotension due to severe MCA stenosis.  Course complicated by ongoing lethargy and somnolence, which had been slowly improving up until the PM hours of 3/13 when he became increasingly lethargic and minimally responsive. Neurology was consulted and is concerned patient is not protecting his airway. PCCM was consulted.  PAST MEDICAL HISTORY :  He  has a past medical history of CAD (coronary artery disease), Complication of anesthesia, Diabetes mellitus without complication (HCC), Dyspnea, GERD (gastroesophageal reflux disease), Hypertension, Ischemic cardiomyopathy, Morbid obesity (HCC), Pneumonia, Sleep apnea, and Stroke (HCC) (12/30/2016).  PAST SURGICAL HISTORY: He  has a past surgical history that includes External ear surgery (1995); LEFT HEART CATH AND CORONARY ANGIOGRAPHY (N/A, 12/22/2017); Coronary artery bypass graft (N/A, February 01, 2018); Radial artery harvest (Right, 01-Feb-2018); and TEE without cardioversion (N/A, 02-01-18).  Allergies  Allergen  Reactions  . Bee Venom Anaphylaxis and Swelling  . Metoprolol Shortness Of Breath  . Penicillins Swelling    Swelling in feet Has patient had a PCN reaction causing immediate rash, facial/tongue/throat swelling, SOB or lightheadedness with hypotension: No Has patient had a PCN reaction causing severe rash involving mucus membranes or skin necrosis: No Has patient had a PCN reaction that required hospitalization: No Has patient had a PCN reaction occurring within the last 10 years: No If all of the above answers are "NO", then may proceed with Cephalosporin use.  . Pollen Extract     UNSPECIFIED REACTION     No current facility-administered medications on file prior to encounter.    Current Outpatient Medications on File Prior to Encounter  Medication Sig  . aspirin EC 81 MG tablet Take 81 mg by mouth daily.   [name] atorvastatin (LIPITOR) 40 MG tablet Take 40 mg by mouth daily.  . Azilsartan Medoxomil (EDARBI) 80 MG TABS Take 80 mg by mouth daily.  [name] BLACK CURRANT SEED OIL PO Take 5 mLs by mouth daily.  . clopidogrel (PLAVIX) 75 MG tablet Take 75 mg by mouth daily.  . Coenzyme Q10 (CO Q-10) 400 MG CAPS Take 400 mg by mouth daily.   . furosemide (LASIX) 20 MG tablet Take 20 mg by mouth at bedtime.   [name] GARLIC PO Take 3 tablets by mouth daily.  . isosorbide-hydrALAZINE (BIDIL) 20-37.5 MG tablet Take 1 tablet by mouth 3 (three) times daily.  . metFORMIN (GLUCOPHAGE-XR) 500 MG 24 hr tablet Take 500 mg by mouth 2 (two) times daily.  . montelukast (SINGULAIR) 10 MG tablet Take 10 mg by mouth at bedtime as needed (allergies).   [name] OVER THE COUNTER MEDICATION Take 1 tablet by mouth daily. blood  sugar control otc supplement  . Probiotic Product (PROBIOTIC DAILY PO) Take 1 capsule by mouth 2 (two) times daily.  . traMADol (ULTRAM) 50 MG tablet Take 150 mg by mouth at bedtime as needed for moderate pain.   . Turmeric 500 MG CAPS Take 500 mg by mouth 2 (two) times daily.   . Vitamin D, Ergocalciferol,  (DRISDOL) 50000 units CAPS capsule Take 50,000 Units by mouth 2 (two) times a week.    FAMILY HISTORY:  His indicated that his mother is alive. He indicated that his father is deceased. He indicated that the status of his brother is unknown.   SOCIAL HISTORY: He  reports that he quit smoking about 15 months ago. His smoking use included cigars. he has never used smokeless tobacco. He reports that he drinks alcohol. He reports that he uses drugs. Drug: Marijuana.  REVIEW OF SYSTEMS:   Unable due to obtundation.   SUBJECTIVE:    VITAL SIGNS: BP 121/60 (BP Location: Right Arm)   Pulse (!) 105   Temp (!) 101.3 F (38.5 C) (Axillary)   Resp (!) 25   Ht 6' 2.02" (1.88 m)   Wt (!) 140.3 kg (309 lb 4.9 oz)   SpO2 95%   BMI 39.70 kg/m   HEMODYNAMICS: CVP:  [8 mmHg] 8 mmHg  VENTILATOR SETTINGS:    INTAKE / OUTPUT: I/O last 3 completed shifts: In: 3280 [NG/GT:3280] Out: 1475 [Urine:1475]  PHYSICAL EXAMINATION: General:  Obese male on vent Neuro:  Sedated on vent, s/p paralytic for intubation.  HEENT:  Faxon/AT, Pupils dilated, sluggish.  Cardiovascular:  Tachy, regular, no MRG Lungs:  Coarse bilateral Abdomen:  Soft, non-distended Musculoskeletal:  No acute deformity.  Skin:  CDI surgical incision with surrounding ecchymosis to R calf.   LABS:  BMET Recent Labs  Lab 01/29/18 0511 01/30/18 0526 01/30/18 0921 02/03/18 0419  NA 151*  --  148* 156*  K 4.4  --  4.3 4.6  CL 111  --  110 116*  CO2 30  --  28 31  BUN 29*  --  38* 52*  CREATININE 1.09 1.11 1.16 1.48*  GLUCOSE 155*  --  228* 166*    Electrolytes Recent Labs  Lab 01/29/18 0511 01/30/18 0921 02/03/18 0419  CALCIUM 8.6* 8.2* 8.3*    CBC Recent Labs  Lab 01/28/18 0247 01/29/18 0511 02/03/18 0419  WBC 15.2* 16.8* 15.7*  HGB 9.1* 9.5* 9.1*  HCT 29.5* 31.7* 31.9*  PLT 399 440* 420*    Coag's No results for input(s): APTT, INR in the last 168 hours.  Sepsis Markers No results for input(s):  LATICACIDVEN, PROCALCITON, O2SATVEN in the last 168 hours.  ABG Recent Labs  Lab 02/03/18 2009 02/03/18 2017  PHART 7.366 7.402  PCO2ART 58.9* 51.5*  PO2ART 43.0* 92.0    Liver Enzymes No results for input(s): AST, ALT, ALKPHOS, BILITOT, ALBUMIN in the last 168 hours.  Cardiac Enzymes No results for input(s): TROPONINI, PROBNP in the last 168 hours.  Glucose Recent Labs  Lab 02/02/18 2331 02/03/18 0358 02/03/18 0741 02/03/18 1129 02/03/18 1530 02/03/18 1954  GLUCAP 188* 156* 161* 214* 189* 215*    Imaging Ct Head Wo Contrast  Result Date: 02/03/2018 CLINICAL DATA:  47 y/o  M; post CVA with mental status change. EXAM: CT HEAD WITHOUT CONTRAST TECHNIQUE: Contiguous axial images were obtained from the base of the skull through the vertex without intravenous contrast. COMPARISON:  01/25/2018 CT head FINDINGS: Brain: Several foci of hypoattenuation within right anterior  insula as well as frontal, parietal, and temporal white matter are increased in conspicuity from the prior CT and probably represent areas of evolving acute infarction. Interval development of hypoattenuation throughout the mid and left aspects of the corpus callosum body and splenium which is new from the prior study and probably represents infarction. Infarction in the left cerebellum demonstrates a new region of hypoattenuation laterally and inferiorly (series 5, image 48) compatible with interval infarction. No hemorrhage or significant mass effect. Vascular: No hyperdense vessel or unexpected calcification. Skull: Normal. Negative for fracture or focal lesion. Sinuses/Orbits: No acute finding. Other: None. IMPRESSION: 1. Increasing or new regions of hypoattenuation scattered throughout right MCA distribution white matter, involving body and splenium of corpus callosum, and in the left lateral cerebellar hemisphere adjacent to prior PICA infarction likely representing interval areas of acute infarction. 2. No  hemorrhage or mass effect.  No midline shift. These results will be called to the ordering clinician or representative by the Radiologist Assistant, and communication documented in the PACS or zVision Dashboard. Electronically Signed   By: Mitzi Hansen M.D.   On: 02/03/2018 21:43   Dg Chest Port 1 View  Result Date: 02/03/2018 CLINICAL DATA:  Status post CABG EXAM: PORTABLE CHEST 1 VIEW COMPARISON:  01/27/2018 FINDINGS: Feeding tube reaches the stomach at least. Left upper extremity PICC with tip at the upper cavoatrial junction. Unchanged cardiopericardial enlargement. Haziness at the left base. No pneumothorax or generalized Kerley lines. IMPRESSION: 1. Low volume chest with retrocardiac opacity favoring atelectasis and pleural fluid. 2. Cardiopericardial enlargement. 3. Stable compared to 01/27/2018 Electronically Signed   By: Marnee Spring M.D.   On: 02/03/2018 08:08     STUDIES:  CT head 3/2 > Negative CTA for large vessel occlusion, Small volume acute ischemic left PICA territory infarct. 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 While subocclusive thrombus would be difficult to exclude, this is felt to be less likely. Query an episode of recent hypotension. Advanced atherosclerotic change affecting the cavernous ICAs bilaterally, left greater than right, slightly progressed from previous. Short-segment 50% proximal left M1 stenosis. CT head 3/4 > new patchy hypodensities in the right corona radiata and right caudate nucleus consistent with acute infarcts. CT head 3/13 > Increasing or new regions of hypoattenuation scattered throughout right MCA distribution white matter, involving body and splenium of corpus callosum, and in the left lateral cerebellar hemisphere adjacent to prior PICA infarction likely representing interval areas of acute infarction. CTA head 3/14 >>>  CULTURES: Blood 3/13 > Urine 3/13 > Tracheal aspirate  3/13 >  ANTIBIOTICS: Cefepime 3/13 > Vancomycin 3/13 >  SIGNIFICANT EVENTS: 3/1 admit for CABG 3/2 lethargy, new stroke identified 3/13 worsening lethargy after about a weak of slow progression.  3/14 intubated for airway protection.   LINES/TUBES: PICC 3/2 > Cortrak 3/12 > ETT 3/14 >  DISCUSSION: 47 year old with hx CVA and CAD. Presented 3/1 for CABG which was without complication. Then, course complicated by acute stroke causing profound lethargy. He was slowly improving until 3/14 when his mentation diminished requiring intubation and further neuro evaluation.   ASSESSMENT / PLAN:  PULMONARY A: Inability to protect airway OSA on CPAP ?Aspiration  P:   Full vent support CXR verified ABG 1 hour VAP bundle  CARDIOVASCULAR A:  CAD s/p CABG 3/1 Ischemic cardiomyopathy  H/o HTN  P:  Telemetry monitoring Per CVTS Start neo infusion, hypotensive immediately post op MAP goal , unless neuro  wants to run pressures high.   RENAL A:   Hypernatremia AKI, worsening  P:   Free water increase Repeat BMP  GASTROINTESTINAL A:   Dysphagia  P:   Hold TF for now Protonix  HEMATOLOGIC A:   No acute issues, maybe hemoconcentrated.   P:  Free water  INFECTIOUS A:   ? HCAP  P:   Continue abx Check PCT Follow cultures  ENDOCRINE A:   DM  P:   CBG monitoring and SSI Lantus  NEUROLOGIC A:   Acute metabolic encephalopathy CVA  P:   RASS goal: 0 to -1 Fentanyl infusion PRN versed Neuro following CTA pending.    FAMILY  - Updates:   - Inter-disciplinary family meet or Palliative Care meeting due by:  3/20   Joneen RoachPaul Hoffman, AGACNP-BC [place] [facility] Pager [phone] or [phone]  02/04/2018 1:29 AM

## 2018-02-04 NOTE — Anesthesia Procedure Notes (Addendum)
Procedure Name: Intubation Date/Time: 02/04/2018 12:22 AM Performed by: Melina SchoolsBanks, Rondell Pardon J, CRNA Pre-anesthesia Checklist: Patient identified, Emergency Drugs available, Suction available, Patient being monitored and Timeout performed Patient Re-evaluated:Patient Re-evaluated prior to induction Oxygen Delivery Method: Ambu bag Preoxygenation: Pre-oxygenation with 100% oxygen Induction Type: IV induction and Rapid sequence Laryngoscope Size: Glidescope and 3 Grade View: Grade II Tube type: Subglottic suction tube Tube size: 8.0 mm Number of attempts: 1 Airway Equipment and Method: Stylet and Video-laryngoscopy Placement Confirmation: ETT inserted through vocal cords under direct vision,  CO2 detector and breath sounds checked- equal and bilateral Secured at: 23 cm Tube secured with: Tape Dental Injury: Teeth and Oropharynx as per pre-operative assessment

## 2018-02-04 NOTE — Progress Notes (Addendum)
STROKE TEAM PROGRESS NOTE   SUBJECTIVE (INTERVAL HISTORY) His brother is at the bedside. Pt still intubated and not on sedation. Not responsive. On two pressors, BP 120/48. Afebrile today. On cefepime and vanco. Significant neuro deficit.    OBJECTIVE Temp:  [98.4 F (36.9 C)-101.4 F (38.6 C)] 98.4 F (36.9 C) (03/14 1144) Pulse Rate:  [76-121] 89 (03/14 1144) Cardiac Rhythm: Normal sinus rhythm (03/14 0400) Resp:  [0-26] 18 (03/14 1144) BP: (51-159)/(25-79) 111/63 (03/14 1144) SpO2:  [79 %-100 %] 100 % (03/14 1144) FiO2 (%):  [40 %-60 %] 40 % (03/14 1144) Weight:  [305 lb 16 oz (138.8 kg)] 305 lb 16 oz (138.8 kg) (03/14 0600)  Recent Labs  Lab 02/03/18 1129 02/03/18 1530 02/03/18 1954 02/04/18 0236 02/04/18 0742  GLUCAP 214* 189* 215* 159* 129*   Recent Labs  Lab 01/29/18 0511 01/30/18 0526 01/30/18 0921 02/03/18 0419 02/04/18 0306  NA 151*  --  148* 156* 151*  K 4.4  --  4.3 4.6 3.6  CL 111  --  110 116* 118*  CO2 30  --  28 31 26   GLUCOSE 155*  --  228* 166* 241*  BUN 29*  --  38* 52* 65*  CREATININE 1.09 1.11 1.16 1.48* 1.71*  CALCIUM 8.6*  --  8.2* 8.3* 6.6*  MG  --   --   --   --  2.7*  PHOS  --   --   --   --  4.0   Recent Labs  Lab 02/04/18 0306  AST 161*  ALT 73*  ALKPHOS 49  BILITOT 1.4*  PROT 5.7*  ALBUMIN 2.1*   Recent Labs  Lab 01/29/18 0511 02/03/18 0419 02/04/18 0306  WBC 16.8* 15.7* 20.3*  NEUTROABS  --   --  16.7*  HGB 9.5* 9.1* 7.5*  HCT 31.7* 31.9* 26.4*  MCV 96.6 101.3* 100.4*  PLT 440* 420* 348   No results for input(s): CKTOTAL, CKMB, CKMBINDEX, TROPONINI in the last 168 hours. No results for input(s): LABPROT, INR in the last 72 hours. Recent Labs    02/03/18 1307  COLORURINE AMBER*  LABSPEC 1.019  PHURINE 5.0  GLUCOSEU NEGATIVE  HGBUR LARGE*  BILIRUBINUR NEGATIVE  KETONESUR NEGATIVE  PROTEINUR NEGATIVE  NITRITE NEGATIVE  LEUKOCYTESUR SMALL*       Component Value Date/Time   CHOL 83 01/24/2018 0409   TRIG  80 01/24/2018 0409   HDL 31 (L) 01/24/2018 0409   CHOLHDL 2.7 01/24/2018 0409   VLDL 16 01/24/2018 0409   LDLCALC 36 01/24/2018 0409   Lab Results  Component Value Date   HGBA1C 7.2 (H) 01/24/2018      Component Value Date/Time   LABOPIA NONE DETECTED 12/30/2016 2146   COCAINSCRNUR NONE DETECTED 12/30/2016 2146   LABBENZ NONE DETECTED 12/30/2016 2146   AMPHETMU NONE DETECTED 12/30/2016 2146   THCU NONE DETECTED 12/30/2016 2146   LABBARB NONE DETECTED 12/30/2016 2146    No results for input(s): ETH in the last 168 hours.  I have personally reviewed the radiological images below and agree with the radiology interpretations.  Ct Angio Head W Or Wo Contrast  Result Date: 02/04/2018 CLINICAL DATA:  Altered mental status.  Recent CABG. EXAM: CT ANGIOGRAPHY HEAD AND NECK TECHNIQUE: Multidetector CT imaging of the head and neck was performed using the standard protocol during bolus administration of intravenous contrast. Multiplanar CT image reconstructions and MIPs were obtained to evaluate the vascular anatomy. Carotid stenosis measurements (when applicable) are obtained utilizing NASCET criteria,  using the distal internal carotid diameter as the denominator. CONTRAST:  50mL ISOVUE-370 IOPAMIDOL (ISOVUE-370) INJECTION 76% COMPARISON:  CTA head neck 01/23/2018. FINDINGS: CTA NECK FINDINGS Aortic arch: There is no calcific atherosclerosis of the aortic arch. There is no aneurysm, dissection or hemodynamically significant stenosis of the visualized ascending aorta and aortic arch. Conventional 3 vessel aortic branching pattern. The visualized proximal subclavian arteries are widely patent. Right carotid system: Moderate narrowing of the distal right common carotid artery, measuring 50%. There is mild atherosclerotic calcification at the carotid bifurcation. No hemodynamically significant stenosis of the remainder of the cervical right internal carotid artery. Left carotid system: The left common  carotid origin is widely patent. There is no common carotid or internal carotid artery dissection or aneurysm. Mild atherosclerotic calcification at the carotid bifurcation without hemodynamically significant stenosis. Vertebral arteries: The vertebral system is right dominant. There is narrowing of the right vertebral artery origin due to atherosclerotic calcification. The remainder of the right vertebral artery is normal. Normal left vertebral artery. There is no bony spinal canal stenosis.  No lytic or blastic lesion. Other neck: Endotracheal tube tip is at the level of clavicular heads. Upper chest: Large left pleural effusion, incompletely visualized. Review of the MIP images confirms the above findings CTA HEAD FINDINGS Anterior circulation: --Intracranial internal carotid arteries: There is atherosclerotic calcification of both internal carotid arteries at the skull base. There is severe stenosis of the right ICA at the proximal lacerum segment. There is severe left ICA stenosis at the distal cavernous and clinoid segment. These findings are unchanged. --Anterior cerebral arteries: Normal. --Middle cerebral arteries: Unchanged moderate-to-severe stenosis of the mid M1 segment of the right middle cerebral artery. Normal left MCA. --Posterior communicating arteries: Absent bilaterally. Posterior circulation: --Basilar artery: Normal. --Posterior cerebral arteries: Normal. --Superior cerebellar arteries: Normal. --Inferior cerebellar arteries: Normal posterior inferior cerebellar arteries. Anterior inferior cerebellar arteries are not clearly visible, but this is not uncommon. Venous sinuses: As permitted by contrast timing, patent. Anatomic variants: None Delayed phase: No parenchymal contrast enhancement. Review of the MIP images confirms the above findings. IMPRESSION: 1. No emergent large vessel occlusion. 2. 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 3. Approximately 50% stenosis of the distal right common carotid artery, unchanged. 4. Large left pleural effusion. Electronically Signed   By: Deatra Robinson M.D.   On: 02/04/2018 01:42   Ct Angio Head W Or Wo Contrast  Result Date: 01/23/2018 CLINICAL DATA:  Initial evaluation for lethargy, difficulty to wake up. Status post CABG. EXAM: CT ANGIOGRAPHY HEAD AND NECK TECHNIQUE: Multidetector CT imaging of the head and neck was performed using the standard protocol during bolus administration of intravenous contrast. Multiplanar CT image reconstructions and MIPs were obtained to evaluate the vascular anatomy. Carotid stenosis measurements (when applicable) are obtained utilizing NASCET criteria, using the distal internal carotid diameter as the denominator. CONTRAST:  50mL ISOVUE-370 IOPAMIDOL (ISOVUE-370) INJECTION 76% COMPARISON:  Prior head CT from 01/02/2017. FINDINGS: CT HEAD FINDINGS Brain: Cerebral volume within normal limits for age. Scattered patchy hypodensity within the subcortical white matter of the right cerebral hemisphere most likely related to remote right MCA territory infarct. No acute intracranial hemorrhage. Parenchymal hypodensity within the inferior left cerebellar hemisphere consistent with acute ischemic infarct, left PICA territory. No other acute large vessel territory infarct. No mass lesion, midline shift or mass effect. No hydrocephalus. No extra-axial fluid collection. Vascular: No hyperdense  vessel. Skull: Scalp soft tissues and calvarium within normal limits. Sinuses: Paranasal sinuses are clear. No mastoid effusion. Orbits: Globes and oval soft tissues within normal limits. Review of the MIP images confirms the above findings CTA NECK FINDINGS Aortic arch: Visualized aortic arch of normal caliber with normal branch pattern. Sequelae of recent CABG noted. No flow-limiting stenosis about the origin of the great vessels. Visualized  subclavian arteries widely patent. Right carotid system: Right common carotid artery widely patent proximally. There is moderate atheromatous narrowing of approximately 50% at the distal right common carotid artery (series 12, image 228). A centric max plaque about the proximal right ICA with associated mild narrowing of approximately 35% by NASCET criteria. Right ICA widely patent distally to the skull base without stenosis, dissection, or occlusion. Left carotid system: Left common carotid artery patent from its origin to the bifurcation without stenosis. Mild calcified atheromatous plaque about the left bifurcation without flow-limiting stenosis. Left ICA patent distally to the skull base without flow-limiting stenosis. Vertebral arteries: Both of the vertebral arteries arise from the subclavian arteries. Calcified plaque at the origin of the vertebral arteries bilaterally with approximately 70% stenosis on the right and 50% stenosis on the left. Multifocal atheromatous irregularity seen throughout the vertebral arteries within the neck but remain patent to the skull base. Skeleton: No acute osseous abnormality. No worrisome lytic or blastic osseous lesions. Recent sternotomy noted. Other neck: No acute soft tissue abnormality within the neck. Right IJ approach central venous catheter. Upper chest: Sequelae of recent CABG. Layering bilateral pleural effusions with associated atelectatic changes noted. Partially visualized lungs are otherwise clear. Review of the MIP images confirms the above findings CTA HEAD FINDINGS Anterior circulation: Petrous segments patent bilaterally. There is extensive calcified atheromatous irregularity throughout the cavernous ICAs bilaterally, slightly progressive as compared to previous. Diameter at the proximal cavernous right ICA measures approximately 2 mm, similar to previous. Focal stenosis at the proximal cavernous left ICA slightly worsened now measuring approximately 2 mm as  well. Distal cavernous left ICA also measures approximately 2 mm, similar to previous. ICAs are patent to the termini. A1 segments irregular but patent. Patent anterior communicating artery. Anterior cerebral arteries patent to their distal aspects. Approximate moderate 50% stenosis at the proximal left M1 segment. Left M1 patent distally. Normal left MCA bifurcation. Distal left MCA branches perfuse. Distal small vessel atheromatous irregularity present throughout the MCA branches bilaterally. There is a focal severe near occlusive stenosis at the proximal right M1 segment, worsened from previous. While subocclusive thrombus would be difficult to exclude, this is less favored (series 12, image 99). Right M1 segment patent distally. No proximal right M2 occlusion. Distal right MCA branches well perfused. Posterior circulation: Moderate stenosis of the left vertebral artery as it crosses into the cranial vault. There is additional severe stenosis of the left V4 segment distally beyond the takeoff of the PICA. Left PICA is patent proximally. Approximate 50% stenosis within the right V4 segment prior to the vertebrobasilar junction. Right PICA patent as well. Basilar artery demonstrates multifocal atheromatous irregularity but is patent to its distal aspect. Superior cerebral arteries patent bilaterally. Both of the posterior cerebral arteries primarily supplied via the basilar. PCAs irregular with multifocal moderate to severe stenoses but are patent to their distal aspects. Venous sinuses: Patent. Anatomic variants: None significant.  No aneurysm. Delayed phase: No abnormal enhancement. Review of the MIP images confirms the above findings IMPRESSION: 1. Negative CTA for large vessel occlusion. 2. 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 Left PICA is patent proximally. 3. 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 While subocclusive thrombus would  be difficult to exclude, this is felt to be less likely. Query an episode of recent hypotension, which superimposed on the underlying stenosis could result in patient's left-sided symptoms. 4. Advanced atherosclerotic change affecting the cavernous ICAs bilaterally, left greater than right, slightly progressed from previous. 5. Short-segment 50% proximal left M1 stenosis. 6. Additional advanced for age diffuse atherosclerotic change throughout the extracranial intracranial circulation as above. Critical Value/emergent results were called by telephone at the time of interpretation on 01/23/2018 at 3:51 pm to Dr. Arther Dames, who verbally acknowledged these results. Electronically Signed   By: Rise Mu M.D.   On: 01/23/2018 16:19    Ct Head Wo Contrast  Result Date: 02/03/2018 CLINICAL DATA:  47 y/o  M; post CVA with mental status change. EXAM: CT HEAD WITHOUT CONTRAST TECHNIQUE: Contiguous axial images were obtained from the base of the skull through the vertex without intravenous contrast. COMPARISON:  01/25/2018 CT head FINDINGS: Brain: Several foci of hypoattenuation within right anterior insula as well as frontal, parietal, and temporal white matter are increased in conspicuity from the prior CT and probably represent areas of evolving acute infarction. Interval development of hypoattenuation throughout the mid and left aspects of the corpus callosum body and splenium which is new from the prior study and probably represents infarction. Infarction in the left cerebellum demonstrates a new region of hypoattenuation laterally and inferiorly (series 5, image 48) compatible with interval infarction. No hemorrhage or significant mass effect. Vascular: No hyperdense vessel or unexpected calcification. Skull: Normal. Negative for fracture or focal lesion. Sinuses/Orbits: No acute finding. Other: None. IMPRESSION: 1. Increasing or new regions of hypoattenuation scattered throughout right MCA  distribution white matter, involving body and splenium of corpus callosum, and in the left lateral cerebellar hemisphere adjacent to prior PICA infarction likely representing interval areas of acute infarction. 2. No hemorrhage or mass effect.  No midline shift. These results will be called to the ordering clinician or representative by the Radiologist Assistant, and communication documented in the PACS or zVision Dashboard. Electronically Signed   By: Mitzi Hansen M.D.   On: 02/03/2018 21:43   Ct Head Wo Contrast  Addendum Date: 01/25/2018   ADDENDUM REPORT: 01/25/2018 08:43 ADDENDUM: Original report by Dr. Chase Picket. Addendum by Dr. Mosetta Putt: The study was reviewed via telephone with Dr. Pearlean Brownie on 01/25/2018 at 8:37 a.m. The patient has left hemiparesis, and there are new patchy hypodensities in the right corona radiata and right caudate nucleus consistent with acute infarcts. Electronically Signed   By: Sebastian Ache M.D.   On: 01/25/2018 08:43   Result Date: 01/25/2018 CLINICAL DATA:  Altered mental status EXAM: CT HEAD WITHOUT CONTRAST TECHNIQUE: Contiguous axial images were obtained from the base of the skull through the vertex without intravenous contrast. COMPARISON:  01/23/2018 FINDINGS: Brain: Slight progression of cytotoxic edema at the left cerebellar infarct site. There is periventricular hypoattenuation compatible with chronic microvascular disease. No hemorrhage. Vascular: No hyperdense vessel or unexpected vascular calcification. Skull: Normal visualized skull base, calvarium and extracranial soft tissues. Sinuses/Orbits: No sinus fluid levels or advanced mucosal thickening. No mastoid effusion. Normal orbits. IMPRESSION: Slight progression of cytotoxic edema at the left cerebellar infarct site. No hemorrhage or other new acute finding. Electronically Signed: By: Deatra Robinson M.D. On: 01/25/2018 06:20   Ct Angio Neck W Or Wo Contrast  Result Date: 02/04/2018 CLINICAL DATA:  Altered  mental status.  Recent CABG. EXAM: CT ANGIOGRAPHY HEAD AND NECK TECHNIQUE: Multidetector CT imaging of the head and neck was performed using the standard protocol during bolus administration of intravenous contrast. Multiplanar CT image reconstructions and MIPs were obtained to evaluate the vascular anatomy. Carotid stenosis measurements (when applicable) are obtained utilizing NASCET criteria, using the distal internal carotid diameter as the denominator. CONTRAST:  50mL ISOVUE-370 IOPAMIDOL (ISOVUE-370) INJECTION 76% COMPARISON:  CTA head neck 01/23/2018. FINDINGS: CTA NECK FINDINGS Aortic arch: There is no calcific atherosclerosis of the aortic arch. There is no aneurysm, dissection or hemodynamically significant stenosis of the visualized ascending aorta and aortic arch. Conventional 3 vessel aortic branching pattern. The visualized proximal subclavian arteries are widely patent. Right carotid system: Moderate narrowing of the distal right common carotid artery, measuring 50%. There is mild atherosclerotic calcification at the carotid bifurcation. No hemodynamically significant stenosis of the remainder of the cervical right internal carotid artery. Left carotid system: The left common carotid origin is widely patent. There is no common carotid or internal carotid artery dissection or aneurysm. Mild atherosclerotic calcification at the carotid bifurcation without hemodynamically significant stenosis. Vertebral arteries: The vertebral system is right dominant. There is narrowing of the right vertebral artery origin due to atherosclerotic calcification. The remainder of the right vertebral artery is normal. Normal left vertebral artery. There is no bony spinal canal stenosis.  No lytic or blastic lesion. Other neck: Endotracheal tube tip is at the level of clavicular heads. Upper chest: Large left pleural effusion, incompletely visualized. Review of the MIP images confirms the above findings CTA HEAD FINDINGS  Anterior circulation: --Intracranial internal carotid arteries: There is atherosclerotic calcification of both internal carotid arteries at the skull base. There is severe stenosis of the right ICA at the proximal lacerum segment. There is severe left ICA stenosis at the distal cavernous and clinoid segment. These findings are unchanged. --Anterior cerebral arteries: Normal. --Middle cerebral arteries: Unchanged moderate-to-severe stenosis of the mid M1 segment of the right middle cerebral artery. Normal left MCA. --Posterior communicating arteries: Absent bilaterally. Posterior circulation: --Basilar artery: Normal. --Posterior cerebral arteries: Normal. --Superior cerebellar arteries: Normal. --Inferior cerebellar arteries: Normal posterior inferior cerebellar arteries. Anterior inferior cerebellar arteries are not clearly visible, but this is not uncommon. Venous sinuses: As permitted by contrast timing, patent. Anatomic variants: None Delayed phase: No parenchymal contrast enhancement. Review of the MIP images confirms the above findings. IMPRESSION: 1. No emergent large vessel occlusion. 2. 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 3. Approximately 50% stenosis of the distal right common carotid artery, unchanged. 4. Large left pleural effusion. Electronically Signed   By: Deatra RobinsonKevin  Herman M.D.   On: 02/04/2018 01:42   Ct Angio Neck W Or Wo Contrast  Result Date: 01/23/2018 CLINICAL DATA:  Initial evaluation for lethargy, difficulty to wake up. Status post CABG. EXAM: CT ANGIOGRAPHY HEAD AND NECK TECHNIQUE: Multidetector CT imaging of the head and neck was performed using the standard protocol during bolus administration of intravenous contrast. Multiplanar CT image reconstructions and MIPs were obtained to evaluate the vascular anatomy. Carotid stenosis measurements (when applicable) are obtained utilizing NASCET  criteria, using the distal internal carotid diameter as the denominator. CONTRAST:  50mL ISOVUE-370 IOPAMIDOL (ISOVUE-370) INJECTION 76% COMPARISON:  Prior head CT from 01/02/2017. FINDINGS: CT HEAD FINDINGS Brain: Cerebral volume within normal limits for age. Scattered patchy hypodensity  within the subcortical white matter of the right cerebral hemisphere most likely related to remote right MCA territory infarct. No acute intracranial hemorrhage. Parenchymal hypodensity within the inferior left cerebellar hemisphere consistent with acute ischemic infarct, left PICA territory. No other acute large vessel territory infarct. No mass lesion, midline shift or mass effect. No hydrocephalus. No extra-axial fluid collection. Vascular: No hyperdense vessel. Skull: Scalp soft tissues and calvarium within normal limits. Sinuses: Paranasal sinuses are clear. No mastoid effusion. Orbits: Globes and oval soft tissues within normal limits. Review of the MIP images confirms the above findings CTA NECK FINDINGS Aortic arch: Visualized aortic arch of normal caliber with normal branch pattern. Sequelae of recent CABG noted. No flow-limiting stenosis about the origin of the great vessels. Visualized subclavian arteries widely patent. Right carotid system: Right common carotid artery widely patent proximally. There is moderate atheromatous narrowing of approximately 50% at the distal right common carotid artery (series 12, image 228). A centric max plaque about the proximal right ICA with associated mild narrowing of approximately 35% by NASCET criteria. Right ICA widely patent distally to the skull base without stenosis, dissection, or occlusion. Left carotid system: Left common carotid artery patent from its origin to the bifurcation without stenosis. Mild calcified atheromatous plaque about the left bifurcation without flow-limiting stenosis. Left ICA patent distally to the skull base without flow-limiting stenosis. Vertebral  arteries: Both of the vertebral arteries arise from the subclavian arteries. Calcified plaque at the origin of the vertebral arteries bilaterally with approximately 70% stenosis on the right and 50% stenosis on the left. Multifocal atheromatous irregularity seen throughout the vertebral arteries within the neck but remain patent to the skull base. Skeleton: No acute osseous abnormality. No worrisome lytic or blastic osseous lesions. Recent sternotomy noted. Other neck: No acute soft tissue abnormality within the neck. Right IJ approach central venous catheter. Upper chest: Sequelae of recent CABG. Layering bilateral pleural effusions with associated atelectatic changes noted. Partially visualized lungs are otherwise clear. Review of the MIP images confirms the above findings CTA HEAD FINDINGS Anterior circulation: Petrous segments patent bilaterally. There is extensive calcified atheromatous irregularity throughout the cavernous ICAs bilaterally, slightly progressive as compared to previous. Diameter at the proximal cavernous right ICA measures approximately 2 mm, similar to previous. Focal stenosis at the proximal cavernous left ICA slightly worsened now measuring approximately 2 mm as well. Distal cavernous left ICA also measures approximately 2 mm, similar to previous. ICAs are patent to the termini. A1 segments irregular but patent. Patent anterior communicating artery. Anterior cerebral arteries patent to their distal aspects. Approximate moderate 50% stenosis at the proximal left M1 segment. Left M1 patent distally. Normal left MCA bifurcation. Distal left MCA branches perfuse. Distal small vessel atheromatous irregularity present throughout the MCA branches bilaterally. There is a focal severe near occlusive stenosis at the proximal right M1 segment, worsened from previous. While subocclusive thrombus would be difficult to exclude, this is less favored (series 12, image 99). Right M1 segment patent distally.  No proximal right M2 occlusion. Distal right MCA branches well perfused. Posterior circulation: Moderate stenosis of the left vertebral artery as it crosses into the cranial vault. There is additional severe stenosis of the left V4 segment distally beyond the takeoff of the PICA. Left PICA is patent proximally. Approximate 50% stenosis within the right V4 segment prior to the vertebrobasilar junction. Right PICA patent as well. Basilar artery demonstrates multifocal atheromatous irregularity but is patent to its distal aspect. Superior cerebral arteries patent bilaterally. Both  of the posterior cerebral arteries primarily supplied via the basilar. PCAs irregular with multifocal moderate to severe stenoses but are patent to their distal aspects. Venous sinuses: Patent. Anatomic variants: None significant.  No aneurysm. Delayed phase: No abnormal enhancement. Review of the MIP images confirms the above findings IMPRESSION: 1. Negative CTA for large vessel occlusion. 2. 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 Left PICA is patent proximally. 3. 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 While subocclusive thrombus would be difficult to exclude, this is felt to be less likely. Query an episode of recent hypotension, which superimposed on the underlying stenosis could result in patient's left-sided symptoms. 4. Advanced atherosclerotic change affecting the cavernous ICAs bilaterally, left greater than right, slightly progressed from previous. 5. Short-segment 50% proximal left M1 stenosis. 6. Additional advanced for age diffuse atherosclerotic change throughout the extracranial intracranial circulation as above. Critical Value/emergent results were called by telephone at the time of interpretation on 01/23/2018 at 3:51 pm to Dr. Arther Dames, who verbally acknowledged these results. Electronically Signed   By: Rise Mu M.D.   On:  01/23/2018 16:19   Ct Cerebral Perfusion W Contrast  Result Date: 01/23/2018 CLINICAL DATA:  Initial evaluation for focal neural deficit, stroke suspected. EXAM: CT PERFUSION BRAIN TECHNIQUE: Multiphase CT imaging of the brain was performed following IV bolus contrast injection. Subsequent parametric perfusion maps were calculated using RAPID software. CONTRAST:  50mL ISOVUE-370 IOPAMIDOL (ISOVUE-370) INJECTION 76% COMPARISON:  Prior CTA from earlier the same day. FINDINGS: CT Brain Perfusion Findings: CBF (<30%) Volume: 0mL Perfusion (Tmax>6.0s) volume: 0mL Mismatch Volume: 0mL Infarction Location:None. Scattered areas of elevated T max greater than 4 seconds within the right watershed distribution, likely related to previous identified right M1 stenosis. IMPRESSION: No evidence for acute ischemia by CT perfusion. Electronically Signed   By: Rise Mu M.D.   On: 01/23/2018 19:53   Dg Chest Port 1 View  Result Date: 02/04/2018 CLINICAL DATA:  Endotracheal tube placement. EXAM: PORTABLE CHEST 1 VIEW COMPARISON:  Chest radiograph performed 02/03/2018 FINDINGS: The patient's endotracheal tube is seen ending 4-5 cm above the carina. An enteric tube is noted extending below the diaphragm. A small left pleural effusion is noted. Left basilar airspace opacity may reflect pneumonia. No pneumothorax is seen. The cardiomediastinal silhouette is mildly enlarged. The patient is status post median sternotomy, with evidence of prior CABG. No acute osseous abnormalities are identified. IMPRESSION: 1. Endotracheal tube seen ending 4-5 cm above the carina. 2. Small left pleural effusion. Left basilar airspace opacity may reflect pneumonia. Electronically Signed   By: Roanna Raider M.D.   On: 02/04/2018 01:10     PHYSICAL EXAM  Temp:  [98.4 F (36.9 C)-101.4 F (38.6 C)] 98.4 F (36.9 C) (03/14 1144) Pulse Rate:  [76-121] 89 (03/14 1144) Resp:  [0-26] 18 (03/14 1144) BP: (51-159)/(25-79) 111/63 (03/14  1144) SpO2:  [79 %-100 %] 100 % (03/14 1144) FiO2 (%):  [40 %-60 %] 40 % (03/14 1144) Weight:  [305 lb 16 oz (138.8 kg)] 305 lb 16 oz (138.8 kg) (03/14 0600)  General - Well nourished, well developed, intubated not on pressor.  Ophthalmologic - fundi not visualized due to noncooperation.  Cardiovascular - Regular rate and rhythm.  Neuro - intubated not on sedation, but on pressors. Not open eyes on voice or pain, PERRL, fixed at mid position, no doll's eyes, intermittent spontaneous vertical nystagmus. No tracking and no blinking to visual threat. No corneal  on the left, weak corneal on the right. Weak gag and cough reflexes. On pain stimulation, no movement BUEs and 1/5 BLEs. DTR diminished and no babinskis. Sensation, coordination and gait not tested.    ASSESSMENT/PLAN Mr. Eliyah Bazzi is a 48 y.o. male with history of CAD, DM, HTN, cardiomyopathy, obesity, OSA, and a stroke in 12/2016 admitted for CABG on 02/16/2018.  Became lethargic second day, with left-sided weakness and left-sided neglect, CT showed right MCA stroke.  However, developed sepsis with septic shock on 02/03/18 followed by unresponsiveness.  neurology was reconsulted.  No tPA given due to out of window.    Stroke :  Right MCA, b/l corpus callosum, left SCA infarcts, likely due to severe intracranial stenosis in the setting of septic shock  Resultant unresponsiveness, intubated  MRI/MRA - not able to perform due to recent CABG  CTA head and neck right M1 severe stenosis, bilateral cavernous ICA high-grade stenosis, right CCA 50% stenosis  Lovenox for VTE prophylaxis  Fall precautions  Diet NPO time specified   aspirin 300 mg suppository daily prior to admission, now on aspirin 325 mg daily and clopidogrel 75 mg daily.   Ongoing aggressive stroke risk factor management  Therapy recommendations:  Pending   Disposition:  pending  Recent stroke  01/23/18 acute left sided weakness, left neglect with lethargy  CT  showed left PICA new infarct  CT repeat showed right MCA infarct   CTA head and neck showed right M1 and M2 stenosis  CT perfusion no penumbra  EEG negative for seizure  EF 55-60%  LDL 36 and A1c 7.2  Put on aspirin 300 PR and Lipitor  History of stroke  12/2016 left lateral medullary infarct  CT head and neck showed bilateral VA stenosis, carotid siphons atherosclerosis, 50% bilateral MCA stenosis  EF 55-60%  LDL 168, A1c 9.5  Patient was put on dual antiplatelet and Lipitor  Septic shock  Fever Tmax 102.6  Leukocytosis WBC 20.3  Hype prolonged hypotension, requiring pressors  On cefepime and vancomycin  Blood culture pending  Intracranial stenosis  CTA head neck x2 confirmed bilateral cavernous ICA high-grade stenosis, right OM1 and M2 severe stenosis, right CCA 50% stenosis  On dual antiplatelet   CAD s/p CABG  Cardiology on board  On bidil  Diabetes  HgbA1c 7.2 goal < 7.0  Uncontrolled  Hyperglycemia  Currently on Levemir  CBG monitoring  SSI  Hyperlipidemia  Home meds: Lipitor  LDL 36, goal < 70  Continue statin at discharge  Other Stroke Risk Factors  Obesity, Body mass index is 39.27 kg/m.   Obstructive sleep apnea, on CPAP at home  Other Active Problems  Hypernatremia  AKI -  creatinine 1.71  Hospital day # 13  This patient is critically ill due to large right MCA stroke, bilateral corpus callosum stroke, unresponsiveness, septic shock, CAD status post CABG and at significant risk of neurological worsening, death form septic shock, hemorrhagic conversion, seizure, heart failure, aspiration pneumonia. This patient's care requires constant monitoring of vital signs, hemodynamics, respiratory and cardiac monitoring, review of multiple databases, neurological assessment, discussion with family, other specialists and medical decision making of high complexity. I had long discussion with brother at bedside, updated pt current  condition, treatment plan and potential poor prognosis. He expressed understanding and appreciation. I spent 40 minutes of neurocritical care time in the care of this patient.   Marvel Plan, MD PhD Stroke Neurology 02/04/2018 12:43 PM    To contact Stroke Continuity provider, please  refer to [email or web address] After hours, contact General Neurology

## 2018-02-04 NOTE — Care Management Note (Signed)
Case Management Note Donn PieriniKristi Klair Leising RN, BSN Unit 4E-Case Manager-- 2H coverage [phone]  Patient Details  Name: Nicholas SparWarren Trimpe MRN: [id number] Date of Birth: 05/06/1971  Subjective/Objective:  Pt admitted s/p CABGx4 with post op CVA                  Action/Plan: PTA pt lived at home, per PT/OT evals recommendations for SNF- CSW to follow for placement needs.   Expected Discharge Date:                  Expected Discharge Plan:  Skilled Nursing Facility  In-House Referral:  Clinical Social Work  Discharge planning Services  CM Consult  Post Acute Care Choice:    Choice offered to:     DME Arranged:    DME Agency:     HH Arranged:    HH Agency:     Status of Service:  In process, will continue to follow  If discussed at Long Length of Stay Meetings, dates discussed:  3/12, 3/14  Discharge Disposition:   Additional Comments:  02/04/18- 1540- Vernida Mcnicholas RN, CM- pt remains with Cortrak- high aspiration risk. CM to continue to follow.   Darrold SpanWebster, Lilton Pare Hall, RN 02/04/2018, 3:39 PM

## 2018-02-04 NOTE — Progress Notes (Signed)
PULMONARY / CRITICAL CARE MEDICINE   Name: Nicholas Mejia MRN: [id number] DOB: 1971-10-31    ADMISSION DATE:  01/27/2018 CONSULTATION DATE:  3/14  REFERRING MD:  Dr. Dorris Fetch, Dr. Laurence Slate  CHIEF COMPLAINT:  Obtundation, CVA  HISTORY OF PRESENT ILLNESS:   47 yo male former smoker with 3 vessel CAD and EF 40%.  Had CABG 3/01.  Altered mental status and Lt sided weakness post op.  Found to have new Lt PICA and Rt subcortical MCA infarcts from intraoperative hypotension with stenosed MCA.  Mental status deteriorated further 3/13 and required intubation for airway protection. PMHx of CVA, CAD, HTN, OSA, DM, GERD  SUBJECTIVE:  Remains on full vent support and pressors.  VITAL SIGNS: BP (!) 144/59   Pulse 77   Temp (!) 100.7 F (38.2 C) (Axillary)   Resp 13   Ht 6' 2.02" (1.88 m)   Wt (!) 305 lb 16 oz (138.8 kg)   SpO2 100%   BMI 39.27 kg/m   HEMODYNAMICS: CVP:  [8 mmHg] 8 mmHg  VENTILATOR SETTINGS: Vent Mode: PRVC FiO2 (%):  [60 %] 60 % Set Rate:  [14 bmp] 14 bmp Vt Set:  [660 mL] 660 mL PEEP:  [5 cmH20] 5 cmH20 Plateau Pressure:  [21 cmH20-25 cmH20] 25 cmH20  INTAKE / OUTPUT: I/O last 3 completed shifts: In: 4079.8 [I.V.:69.8; NG/GT:3210; IV Piggyback:800] Out: 1630 [Urine:1630]  PHYSICAL EXAMINATION: General:  Obese male on vent Neuro:  Sedated on vent, s/p paralytic for intubation.  HEENT:  [place]/AT, Pupils dilated, sluggish.  Cardiovascular:  Tachy, regular, no MRG Lungs:  Coarse bilateral Abdomen:  Soft, non-distended Musculoskeletal:  No acute deformity.  Skin:  CDI surgical incision with surrounding ecchymosis to R calf.   LABS:  BMET Recent Labs  Lab 01/30/18 0921 02/03/18 0419 02/04/18 0306  NA 148* 156* 151*  K 4.3 4.6 3.6  CL 110 116* 118*  CO2 28 31 26   BUN 38* 52* 65*  CREATININE 1.16 1.48* 1.71*  GLUCOSE 228* 166* 241*    Electrolytes Recent Labs  Lab 01/30/18 0921 02/03/18 0419 02/04/18 0306  CALCIUM 8.2* 8.3* 6.6*  MG  --   --  2.7*   PHOS  --   --  4.0    CBC Recent Labs  Lab 01/29/18 0511 02/03/18 0419 02/04/18 0306  WBC 16.8* 15.7* 20.3*  HGB 9.5* 9.1* 7.5*  HCT 31.7* 31.9* 26.4*  PLT 440* 420* 348    Coag's No results for input(s): APTT, INR in the last 168 hours.  Sepsis Markers No results for input(s): LATICACIDVEN, PROCALCITON, O2SATVEN in the last 168 hours.  ABG Recent Labs  Lab 02/03/18 2009 02/03/18 2017 02/04/18 0152  PHART 7.366 7.402 7.375  PCO2ART 58.9* 51.5* 56.7*  PO2ART 43.0* 92.0 118.0*    Liver Enzymes Recent Labs  Lab 02/04/18 0306  AST 161*  ALT 73*  ALKPHOS 49  BILITOT 1.4*  ALBUMIN 2.1*    Cardiac Enzymes No results for input(s): TROPONINI, PROBNP in the last 168 hours.  Glucose Recent Labs  Lab 02/03/18 0358 02/03/18 0741 02/03/18 1129 02/03/18 1530 02/03/18 1954 02/04/18 0236  GLUCAP 156* 161* 214* 189* 215* 159*    Imaging Ct Angio Head W Or Wo Contrast  Result Date: 02/04/2018 CLINICAL DATA:  Altered mental status.  Recent CABG. EXAM: CT ANGIOGRAPHY HEAD AND NECK TECHNIQUE: Multidetector CT imaging of the head and neck was performed using the standard protocol during bolus administration of intravenous contrast. Multiplanar CT image reconstructions and MIPs were obtained  to evaluate the vascular anatomy. Carotid stenosis measurements (when applicable) are obtained utilizing NASCET criteria, using the distal internal carotid diameter as the denominator. CONTRAST:  50mL ISOVUE-370 IOPAMIDOL (ISOVUE-370) INJECTION 76% COMPARISON:  CTA head neck 01/23/2018. FINDINGS: CTA NECK FINDINGS Aortic arch: There is no calcific atherosclerosis of the aortic arch. There is no aneurysm, dissection or hemodynamically significant stenosis of the visualized ascending aorta and aortic arch. Conventional 3 vessel aortic branching pattern. The visualized proximal subclavian arteries are widely patent. Right carotid system: Moderate narrowing of the distal right common carotid  artery, measuring 50%. There is mild atherosclerotic calcification at the carotid bifurcation. No hemodynamically significant stenosis of the remainder of the cervical right internal carotid artery. Left carotid system: The left common carotid origin is widely patent. There is no common carotid or internal carotid artery dissection or aneurysm. Mild atherosclerotic calcification at the carotid bifurcation without hemodynamically significant stenosis. Vertebral arteries: The vertebral system is right dominant. There is narrowing of the right vertebral artery origin due to atherosclerotic calcification. The remainder of the right vertebral artery is normal. Normal left vertebral artery. There is no bony spinal canal stenosis.  No lytic or blastic lesion. Other neck: Endotracheal tube tip is at the level of clavicular heads. Upper chest: Large left pleural effusion, incompletely visualized. Review of the MIP images confirms the above findings CTA HEAD FINDINGS Anterior circulation: --Intracranial internal carotid arteries: There is atherosclerotic calcification of both internal carotid arteries at the skull base. There is severe stenosis of the right ICA at the proximal lacerum segment. There is severe left ICA stenosis at the distal cavernous and clinoid segment. These findings are unchanged. --Anterior cerebral arteries: Normal. --Middle cerebral arteries: Unchanged moderate-to-severe stenosis of the mid M1 segment of the right middle cerebral artery. Normal left MCA. --Posterior communicating arteries: Absent bilaterally. Posterior circulation: --Basilar artery: Normal. --Posterior cerebral arteries: Normal. --Superior cerebellar arteries: Normal. --Inferior cerebellar arteries: Normal posterior inferior cerebellar arteries. Anterior inferior cerebellar arteries are not clearly visible, but this is not uncommon. Venous sinuses: As permitted by contrast timing, patent. Anatomic variants: None Delayed phase: No  parenchymal contrast enhancement. Review of the MIP images confirms the above findings. IMPRESSION: 1. No emergent large vessel occlusion. 2. 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 3. Approximately 50% stenosis of the distal right common carotid artery, unchanged. 4. Large left pleural effusion. Electronically Signed   By: Deatra Robinson M.D.   On: 02/04/2018 01:42   Ct Head Wo Contrast  Result Date: 02/03/2018 CLINICAL DATA:  47 y/o  M; post CVA with mental status change. EXAM: CT HEAD WITHOUT CONTRAST TECHNIQUE: Contiguous axial images were obtained from the base of the skull through the vertex without intravenous contrast. COMPARISON:  01/25/2018 CT head FINDINGS: Brain: Several foci of hypoattenuation within right anterior insula as well as frontal, parietal, and temporal white matter are increased in conspicuity from the prior CT and probably represent areas of evolving acute infarction. Interval development of hypoattenuation throughout the mid and left aspects of the corpus callosum body and splenium which is new from the prior study and probably represents infarction. Infarction in the left cerebellum demonstrates a new region of hypoattenuation laterally and inferiorly (series 5, image 48) compatible with interval infarction. No hemorrhage or significant mass effect. Vascular: No hyperdense vessel or unexpected calcification. Skull: Normal. Negative for fracture or focal lesion. Sinuses/Orbits: No acute finding. Other: None. IMPRESSION:  1. Increasing or new regions of hypoattenuation scattered throughout right MCA distribution white matter, involving body and splenium of corpus callosum, and in the left lateral cerebellar hemisphere adjacent to prior PICA infarction likely representing interval areas of acute infarction. 2. No hemorrhage or mass effect.  No midline shift. These results will be called to the  ordering clinician or representative by the Radiologist Assistant, and communication documented in the PACS or zVision Dashboard. Electronically Signed   By: Mitzi HansenLance  Furusawa-Stratton M.D.   On: 02/03/2018 21:43   Ct Angio Neck W Or Wo Contrast  Result Date: 02/04/2018 CLINICAL DATA:  Altered mental status.  Recent CABG. EXAM: CT ANGIOGRAPHY HEAD AND NECK TECHNIQUE: Multidetector CT imaging of the head and neck was performed using the standard protocol during bolus administration of intravenous contrast. Multiplanar CT image reconstructions and MIPs were obtained to evaluate the vascular anatomy. Carotid stenosis measurements (when applicable) are obtained utilizing NASCET criteria, using the distal internal carotid diameter as the denominator. CONTRAST:  50mL ISOVUE-370 IOPAMIDOL (ISOVUE-370) INJECTION 76% COMPARISON:  CTA head neck 01/23/2018. FINDINGS: CTA NECK FINDINGS Aortic arch: There is no calcific atherosclerosis of the aortic arch. There is no aneurysm, dissection or hemodynamically significant stenosis of the visualized ascending aorta and aortic arch. Conventional 3 vessel aortic branching pattern. The visualized proximal subclavian arteries are widely patent. Right carotid system: Moderate narrowing of the distal right common carotid artery, measuring 50%. There is mild atherosclerotic calcification at the carotid bifurcation. No hemodynamically significant stenosis of the remainder of the cervical right internal carotid artery. Left carotid system: The left common carotid origin is widely patent. There is no common carotid or internal carotid artery dissection or aneurysm. Mild atherosclerotic calcification at the carotid bifurcation without hemodynamically significant stenosis. Vertebral arteries: The vertebral system is right dominant. There is narrowing of the right vertebral artery origin due to atherosclerotic calcification. The remainder of the right vertebral artery is normal. Normal left  vertebral artery. There is no bony spinal canal stenosis.  No lytic or blastic lesion. Other neck: Endotracheal tube tip is at the level of clavicular heads. Upper chest: Large left pleural effusion, incompletely visualized. Review of the MIP images confirms the above findings CTA HEAD FINDINGS Anterior circulation: --Intracranial internal carotid arteries: There is atherosclerotic calcification of both internal carotid arteries at the skull base. There is severe stenosis of the right ICA at the proximal lacerum segment. There is severe left ICA stenosis at the distal cavernous and clinoid segment. These findings are unchanged. --Anterior cerebral arteries: Normal. --Middle cerebral arteries: Unchanged moderate-to-severe stenosis of the mid M1 segment of the right middle cerebral artery. Normal left MCA. --Posterior communicating arteries: Absent bilaterally. Posterior circulation: --Basilar artery: Normal. --Posterior cerebral arteries: Normal. --Superior cerebellar arteries: Normal. --Inferior cerebellar arteries: Normal posterior inferior cerebellar arteries. Anterior inferior cerebellar arteries are not clearly visible, but this is not uncommon. Venous sinuses: As permitted by contrast timing, patent. Anatomic variants: None Delayed phase: No parenchymal contrast enhancement. Review of the MIP images confirms the above findings. IMPRESSION: 1. No emergent large vessel occlusion. 2. 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 3. Approximately 50% stenosis of the distal right common carotid artery, unchanged. 4. Large left pleural effusion. Electronically Signed   By: Deatra RobinsonKevin  Herman M.D.   On: 02/04/2018 01:42   Dg Chest Port 1 View  Result Date: 02/04/2018 CLINICAL DATA:  Endotracheal tube placement. EXAM: PORTABLE  CHEST 1 VIEW COMPARISON:  Chest radiograph performed 02/03/2018 FINDINGS: The patient's endotracheal  tube is seen ending 4-5 cm above the carina. An enteric tube is noted extending below the diaphragm. A small left pleural effusion is noted. Left basilar airspace opacity may reflect pneumonia. No pneumothorax is seen. The cardiomediastinal silhouette is mildly enlarged. The patient is status post median sternotomy, with evidence of prior CABG. No acute osseous abnormalities are identified. IMPRESSION: 1. Endotracheal tube seen ending 4-5 cm above the carina. 2. Small left pleural effusion. Left basilar airspace opacity may reflect pneumonia. Electronically Signed   By: Roanna Raider M.D.   On: 02/04/2018 01:10     STUDIES:  CT head 3/2 > Negative CTA for large vessel occlusion, Small volume acute ischemic left PICA territory infarct. 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 While subocclusive thrombus would be difficult to exclude, this is felt to be less likely. Query an episode of recent hypotension. Advanced atherosclerotic change affecting the cavernous ICAs bilaterally, left greater than right, slightly progressed from previous. Short-segment 50% proximal left M1 stenosis. CT head 3/4 > new patchy hypodensities in the right corona radiata and right caudate nucleus consistent with acute infarcts. CT head 3/13 > Increasing or new regions of hypoattenuation scattered throughout right MCA distribution white matter, involving body and splenium of corpus callosum, and in the left lateral cerebellar hemisphere adjacent to prior PICA infarction likely representing interval areas of acute infarction. CTA head 3/14 >No emergent large vessel occlusion.  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  Approximately 50% stenosis of the distal right common carotid artery, unchanged.  CULTURES: Blood 3/13 > Urine 3/13 > Tracheal aspirate 3/13  >  ANTIBIOTICS: Cefepime 3/13 > Vancomycin 3/13 >  SIGNIFICANT EVENTS: 3/1 admit for CABG 3/2 lethargy, new stroke identified 3/13 worsening lethargy after about a weak of slow progression.  3/14 intubated for airway protection.   LINES/TUBES: PICC 3/2 > Cortrak 3/12 > ETT 3/14 >  DISCUSSION: Unclear what caused progression of altered mental status overnight.  He has chronic hypercapnia, and ABG appears compensated.  He has hypernatremia, but doesn't seem like this would be significant enough to cause him to not be able to control his airway.  Repeat neuro imaging seems unchanged.  ASSESSMENT / PLAN:  Acute on chronic hypoxic/hypercapnic respiratory failure. OSA. - full vent support - f/u CXR  Acute metabolic encephalopathy. CVA. - ASA, plavix - neuro following - check TSH  CAD s/p CABG. Acute on chronic systolic CHF. Hx of HTN. - per TCTS  Possible HCAP. - continue Abx  Hypernatremia. CKD 2. - free water - f/u BMET  Anemia of critical illness. - f/u CBC  DM type II. - SSI with levemir  DVT prophylaxis - lovenox SUP - protonix Nutrition - tube feeds Goals of care - full code  CC time 33 minutes  Coralyn Helling, MD [facility] Pulmonary/Critical Care 02/04/2018, 7:51 AM Pager:  [phone] After 3pm call: [phone]

## 2018-02-04 NOTE — Progress Notes (Signed)
13 Days Post-Op Procedure(s) (LRB): CORONARY ARTERY BYPASS GRAFTING (CABG) TIMES Four,  USING LEFT INTERNAL MAMMARY ARTERY, RIGHT RADIAL ARTERY AND RIGHT SAPHENOUS LEG VEIN HARVESTED ENDOSCOPICALLY (N/A) RADIAL ARTERY HARVEST (Right) TRANSESOPHAGEAL ECHOCARDIOGRAM (TEE) (N/A) Subjective: Intubated last night for unresponsiveness  Objective: Vital signs in last 24 hours: Temp:  [98.6 F (37 C)-102.6 F (39.2 C)] 99.4 F (37.4 C) (03/14 0736) Pulse Rate:  [76-121] 83 (03/14 0743) Cardiac Rhythm: Normal sinus rhythm (03/14 0400) Resp:  [0-39] 15 (03/14 0743) BP: (51-159)/(25-79) 159/67 (03/14 0743) SpO2:  [79 %-100 %] 100 % (03/14 0743) FiO2 (%):  [40 %-60 %] 40 % (03/14 0743) Weight:  [305 lb 16 oz (138.8 kg)] 305 lb 16 oz (138.8 kg) (03/14 0600)  Hemodynamic parameters for last 24 hours: CVP:  [8 mmHg] 8 mmHg  Intake/Output from previous day: 03/13 0701 - 03/14 0700 In: 4296.1 [I.V.:656.1; NG/GT:2840; IV Piggyback:800] Out: 2355 [Urine:2355] Intake/Output this shift: No intake/output data recorded.  General appearance: intubated Neurologic: sedated Heart: regular rate and rhythm Lungs: clear to auscultation bilaterally Abdomen: normal findings: soft, non-tender  Lab Results: Recent Labs    02/03/18 0419 02/04/18 0306  WBC 15.7* 20.3*  HGB 9.1* 7.5*  HCT 31.9* 26.4*  PLT 420* 348   BMET:  Recent Labs    02/03/18 0419 02/04/18 0306  NA 156* 151*  K 4.6 3.6  CL 116* 118*  CO2 31 26  GLUCOSE 166* 241*  BUN 52* 65*  CREATININE 1.48* 1.71*  CALCIUM 8.3* 6.6*    PT/INR: No results for input(s): LABPROT, INR in the last 72 hours. ABG    Component Value Date/Time   PHART 7.375 02/04/2018 0152   HCO3 32.6 (H) 02/04/2018 0152   TCO2 34 (H) 02/04/2018 0152   ACIDBASEDEF 3.0 (H) 01/23/2018 0846   O2SAT 98.0 02/04/2018 0152   CBG (last 3)  Recent Labs    02/03/18 1954 02/04/18 0236 02/04/18 0742  GLUCAP 215* 159* 129*    Assessment/Plan: S/P  Procedure(s) (LRB): CORONARY ARTERY BYPASS GRAFTING (CABG) TIMES Four,  USING LEFT INTERNAL MAMMARY ARTERY, RIGHT RADIAL ARTERY AND RIGHT SAPHENOUS LEG VEIN HARVESTED ENDOSCOPICALLY (N/A) RADIAL ARTERY HARVEST (Right) TRANSESOPHAGEAL ECHOCARDIOGRAM (TEE) (N/A) -NEURO- Right CVA with left hemiparesis  Less responsive yesterday in setting of fever, possible sepsis- intubated overnight  No significant change on head CT  CV- in SR. BP Ok on dopamine and neo drips  RESP- acute on chronic respiratory failure  Currently on 40% FiO2 with good sats  Vent per CCM  ID_ fever and leukocytosis, started on Vanco and Cefepime yesterday  Await culture results  RENAL- UOP picked up overnight  Creatinine up slightly to 1.71 c/w acute kidney injury- follow  Hypernatremia- improved  ENDO- CBG moderately elevated  Enoxaparin + SCD for DVT prophylaxis   LOS: 13 days    Nicholas Mejia 02/04/2018

## 2018-02-05 ENCOUNTER — Inpatient Hospital Stay (HOSPITAL_COMMUNITY): Payer: BLUE CROSS/BLUE SHIELD

## 2018-02-05 DIAGNOSIS — J9621 Acute and chronic respiratory failure with hypoxia: Secondary | ICD-10-CM

## 2018-02-05 DIAGNOSIS — J9622 Acute and chronic respiratory failure with hypercapnia: Secondary | ICD-10-CM

## 2018-02-05 DIAGNOSIS — Z8673 Personal history of transient ischemic attack (TIA), and cerebral infarction without residual deficits: Secondary | ICD-10-CM

## 2018-02-05 LAB — GLUCOSE, CAPILLARY
GLUCOSE-CAPILLARY: 188 mg/dL — AB (ref 65–99)
GLUCOSE-CAPILLARY: 192 mg/dL — AB (ref 65–99)
Glucose-Capillary: 222 mg/dL — ABNORMAL HIGH (ref 65–99)
Glucose-Capillary: 241 mg/dL — ABNORMAL HIGH (ref 65–99)
Glucose-Capillary: 217 mg/dL — ABNORMAL HIGH (ref 65–99)

## 2018-02-05 LAB — CBC
HCT: 27.3 % — ABNORMAL LOW (ref 39.0–52.0)
HEMOGLOBIN: 7.7 g/dL — AB (ref 13.0–17.0)
MCH: 28.9 pg (ref 26.0–34.0)
MCHC: 28.2 g/dL — AB (ref 30.0–36.0)
MCV: 102.6 fL — ABNORMAL HIGH (ref 78.0–100.0)
Platelets: 365 10*3/uL (ref 150–400)
RBC: 2.66 MIL/uL — ABNORMAL LOW (ref 4.22–5.81)
RDW: 15.5 % (ref 11.5–15.5)
WBC: 17.7 10*3/uL — ABNORMAL HIGH (ref 4.0–10.5)

## 2018-02-05 LAB — CULTURE, RESPIRATORY: CULTURE: NORMAL

## 2018-02-05 LAB — BLOOD GAS, ARTERIAL
Acid-Base Excess: 5.7 mmol/L — ABNORMAL HIGH (ref 0.0–2.0)
Bicarbonate: 30 mmol/L — ABNORMAL HIGH (ref 20.0–28.0)
Drawn by: 330991
FIO2: 40
O2 Saturation: 97.1 %
PATIENT TEMPERATURE: 98.6
PCO2 ART: 46.4 mmHg (ref 32.0–48.0)
PEEP: 5 cmH2O
PH ART: 7.427 (ref 7.350–7.450)
PO2 ART: 91.8 mmHg (ref 83.0–108.0)
RATE: 14 resp/min
VT: 660 mL

## 2018-02-05 LAB — BASIC METABOLIC PANEL
ANION GAP: 5 (ref 5–15)
BUN: 55 mg/dL — ABNORMAL HIGH (ref 6–20)
CALCIUM: 6.6 mg/dL — AB (ref 8.9–10.3)
CHLORIDE: 125 mmol/L — AB (ref 101–111)
CO2: 26 mmol/L (ref 22–32)
Creatinine, Ser: 1.38 mg/dL — ABNORMAL HIGH (ref 0.61–1.24)
GFR calc Af Amer: >60 mL/min (ref >60)
GFR calc non Af Amer: 59 mL/min — ABNORMAL LOW (ref >60)
Glucose, Bld: 280 mg/dL — ABNORMAL HIGH (ref 65–99)
Potassium: 3.6 mmol/L (ref 3.5–5.1)
Sodium: 156 mmol/L — ABNORMAL HIGH (ref 135–145)

## 2018-02-05 LAB — CULTURE, RESPIRATORY W GRAM STAIN

## 2018-02-05 MED ORDER — INSULIN ASPART 100 UNIT/ML ~~LOC~~ SOLN
0.0000 [IU] | SUBCUTANEOUS | Status: DC
  Administered 2018-02-05: 7 [IU] via SUBCUTANEOUS
  Administered 2018-02-05 (×2): 4 [IU] via SUBCUTANEOUS
  Administered 2018-02-05 – 2018-02-06 (×3): 7 [IU] via SUBCUTANEOUS
  Administered 2018-02-06 (×2): 11 [IU] via SUBCUTANEOUS
  Administered 2018-02-06 (×2): 7 [IU] via SUBCUTANEOUS
  Administered 2018-02-07: 4 [IU] via SUBCUTANEOUS
  Administered 2018-02-07: 11 [IU] via SUBCUTANEOUS
  Administered 2018-02-07: 7 [IU] via SUBCUTANEOUS
  Administered 2018-02-07: 4 [IU] via SUBCUTANEOUS
  Administered 2018-02-07: 7 [IU] via SUBCUTANEOUS
  Administered 2018-02-07 (×2): 4 [IU] via SUBCUTANEOUS
  Administered 2018-02-08: 3 [IU] via SUBCUTANEOUS
  Administered 2018-02-08: 4 [IU] via SUBCUTANEOUS
  Administered 2018-02-08 (×2): 3 [IU] via SUBCUTANEOUS
  Administered 2018-02-09: 4 [IU] via SUBCUTANEOUS
  Administered 2018-02-09: 3 [IU] via SUBCUTANEOUS
  Administered 2018-02-09: 4 [IU] via SUBCUTANEOUS
  Administered 2018-02-09 – 2018-02-12 (×8): 3 [IU] via SUBCUTANEOUS

## 2018-02-05 MED ORDER — NOREPINEPHRINE BITARTRATE 1 MG/ML IV SOLN
0.0000 ug/min | INTRAVENOUS | Status: DC
  Administered 2018-02-05: 3 ug/min via INTRAVENOUS
  Administered 2018-02-08 – 2018-02-10 (×2): 5 ug/min via INTRAVENOUS
  Filled 2018-02-05 (×4): qty 16

## 2018-02-05 NOTE — Progress Notes (Signed)
14 Days Post-Op Procedure(s) (LRB): CORONARY ARTERY BYPASS GRAFTING (CABG) TIMES Four,  USING LEFT INTERNAL MAMMARY ARTERY, RIGHT RADIAL ARTERY AND RIGHT SAPHENOUS LEG VEIN HARVESTED ENDOSCOPICALLY (N/A) RADIAL ARTERY HARVEST (Right) TRANSESOPHAGEAL ECHOCARDIOGRAM (TEE) (N/A) Subjective: Intubated, unresponsive  Objective: Vital signs in last 24 hours: Temp:  [98.4 F (36.9 C)-102.9 F (39.4 C)] 100.2 F (37.9 C) (03/15 0730) Pulse Rate:  [80-101] 100 (03/15 0600) Cardiac Rhythm: Normal sinus rhythm (03/15 0400) Resp:  [12-24] 17 (03/15 0600) BP: (83-163)/(48-103) 145/73 (03/15 0600) SpO2:  [97 %-100 %] 100 % (03/15 0600) FiO2 (%):  [40 %] 40 % (03/15 0600) Weight:  [308 lb 10.3 oz (140 kg)] 308 lb 10.3 oz (140 kg) (03/15 0600)  Hemodynamic parameters for last 24 hours:    Intake/Output from previous day: 03/14 0701 - 03/15 0700 In: 4091.2 [I.V.:1838.7; NG/GT:1452.5; IV Piggyback:800] Out: 3915 [Urine:3915] Intake/Output this shift: No intake/output data recorded.  General appearance: intubated Neurologic: unresponsive Heart: regular rate and rhythm Lungs: diminished breath sounds on left Abdomen: normal findings: soft, non-tender Wound: clean and dry  Lab Results: Recent Labs    02/04/18 0306 02/05/18 0441  WBC 20.3* 17.7*  HGB 7.5* 7.7*  HCT 26.4* 27.3*  PLT 348 365   BMET:  Recent Labs    02/04/18 0306 02/05/18 0441  NA 151* 156*  K 3.6 3.6  CL 118* 125*  CO2 26 26  GLUCOSE 241* 280*  BUN 65* 55*  CREATININE 1.71* 1.38*  CALCIUM 6.6* 6.6*    PT/INR: No results for input(s): LABPROT, INR in the last 72 hours. ABG    Component Value Date/Time   PHART 7.427 02/05/2018 0335   HCO3 30.0 (H) 02/05/2018 0335   TCO2 34 (H) 02/04/2018 0152   ACIDBASEDEF 3.0 (H) 01/23/2018 0846   O2SAT 97.1 02/05/2018 0335   CBG (last 3)  Recent Labs    02/04/18 1555 02/04/18 1949 02/05/18 0230  GLUCAP 224* 238* 222*    Assessment/Plan: S/P Procedure(s)  (LRB): CORONARY ARTERY BYPASS GRAFTING (CABG) TIMES Four,  USING LEFT INTERNAL MAMMARY ARTERY, RIGHT RADIAL ARTERY AND RIGHT SAPHENOUS LEG VEIN HARVESTED ENDOSCOPICALLY (N/A) RADIAL ARTERY HARVEST (Right) TRANSESOPHAGEAL ECHOCARDIOGRAM (TEE) (N/A) remains critically ill  CV- hemodynamically stable on dopamine and neo drips  RESP- increased opacity on left- suspect effusion- will obtain CT chest to better assess- likely will need a chest tube  RENAL- creatinine better but hyponatremia worse this AM  Continue free water via Tube  ENDO- CBG elevated- go back to Q4  ID- still spiking temps, probable pneumonia- CT chest  Continue vanco and cefipime  NEURO- CT showed no change in vasculature but Neuro did see evidence of additional stroke  Prognosis guarded   LOS: 14 days    Loreli SlotSteven C Noa Constante 02/05/2018

## 2018-02-05 NOTE — Progress Notes (Signed)
Dr Dorris FetchHendrickson called with the results of CT chest. He asked staff to call mother to let her know he would call her after office to get consent for chest tube placement. I called Mother Ms. Hairston and spoke with her. She is on her way up to the hosptial at present.

## 2018-02-05 NOTE — Progress Notes (Signed)
Transported pt to CT scan and back on full support through the vent and 100% FIO2. Pt was stable throughout and placed back on original FIO2 once completed.

## 2018-02-05 NOTE — Progress Notes (Signed)
STROKE TEAM PROGRESS NOTE   SUBJECTIVE (INTERVAL HISTORY) His RN is at the bedside. Pt still intubated and not on sedation. Not responsive. No significant neuro change overnight. Still on two pressors, BP 122/67. Tmax 102.9. On cefepime and vanco.     OBJECTIVE Temp:  [98.4 F (36.9 C)-102.9 F (39.4 C)] 100.2 F (37.9 C) (03/15 0730) Pulse Rate:  [81-101] 97 (03/15 0750) Cardiac Rhythm: Normal sinus rhythm (03/15 0400) Resp:  [14-24] 19 (03/15 0750) BP: (83-163)/(48-103) 148/81 (03/15 0750) SpO2:  [97 %-100 %] 98 % (03/15 0928) FiO2 (%):  [30 %-40 %] 30 % (03/15 0750) Weight:  [308 lb 10.3 oz (140 kg)] 308 lb 10.3 oz (140 kg) (03/15 0600)  Recent Labs  Lab 02/04/18 1234 02/04/18 1555 02/04/18 1949 02/05/18 0230 02/05/18 0728  GLUCAP 221* 224* 238* 222* 188*   Recent Labs  Lab 01/30/18 0526  01/30/18 0921 02/03/18 0419 02/04/18 0306 02/05/18 0441  NA  --   --  148* 156* 151* 156*  K  --   --  4.3 4.6 3.6 3.6  CL  --   --  110 116* 118* 125*  CO2  --   --  28 31 26 26   GLUCOSE  --   --  228* 166* 241* 280*  BUN  --   --  38* 52* 65* 55*  CREATININE 1.11  --  1.16 1.48* 1.71* 1.38*  CALCIUM  --    < > 8.2* 8.3* 6.6* 6.6*  MG  --   --   --   --  2.7*  --   PHOS  --   --   --   --  4.0  --    < > = values in this interval not displayed.   Recent Labs  Lab 02/04/18 0306  AST 161*  ALT 73*  ALKPHOS 49  BILITOT 1.4*  PROT 5.7*  ALBUMIN 2.1*   Recent Labs  Lab 02/03/18 0419 02/04/18 0306 02/05/18 0441  WBC 15.7* 20.3* 17.7*  NEUTROABS  --  16.7*  --   HGB 9.1* 7.5* 7.7*  HCT 31.9* 26.4* 27.3*  MCV 101.3* 100.4* 102.6*  PLT 420* 348 365   No results for input(s): CKTOTAL, CKMB, CKMBINDEX, TROPONINI in the last 168 hours. No results for input(s): LABPROT, INR in the last 72 hours. Recent Labs    02/03/18 1307  COLORURINE AMBER*  LABSPEC 1.019  PHURINE 5.0  GLUCOSEU NEGATIVE  HGBUR LARGE*  BILIRUBINUR NEGATIVE  KETONESUR NEGATIVE  PROTEINUR  NEGATIVE  NITRITE NEGATIVE  LEUKOCYTESUR SMALL*       Component Value Date/Time   CHOL 83 01/24/2018 0409   TRIG 80 01/24/2018 0409   HDL 31 (L) 01/24/2018 0409   CHOLHDL 2.7 01/24/2018 0409   VLDL 16 01/24/2018 0409   LDLCALC 36 01/24/2018 0409   Lab Results  Component Value Date   HGBA1C 7.2 (H) 01/24/2018      Component Value Date/Time   LABOPIA NONE DETECTED 12/30/2016 2146   COCAINSCRNUR NONE DETECTED 12/30/2016 2146   LABBENZ NONE DETECTED 12/30/2016 2146   AMPHETMU NONE DETECTED 12/30/2016 2146   THCU NONE DETECTED 12/30/2016 2146   LABBARB NONE DETECTED 12/30/2016 2146    No results for input(s): ETH in the last 168 hours.  I have personally reviewed the radiological images below and agree with the radiology interpretations.  Ct Angio Head W Or Wo Contrast  Result Date: 02/04/2018 CLINICAL DATA:  Altered mental status.  Recent CABG. EXAM: CT ANGIOGRAPHY  HEAD AND NECK TECHNIQUE: Multidetector CT imaging of the head and neck was performed using the standard protocol during bolus administration of intravenous contrast. Multiplanar CT image reconstructions and MIPs were obtained to evaluate the vascular anatomy. Carotid stenosis measurements (when applicable) are obtained utilizing NASCET criteria, using the distal internal carotid diameter as the denominator. CONTRAST:  50mL ISOVUE-370 IOPAMIDOL (ISOVUE-370) INJECTION 76% COMPARISON:  CTA head neck 01/23/2018. FINDINGS: CTA NECK FINDINGS Aortic arch: There is no calcific atherosclerosis of the aortic arch. There is no aneurysm, dissection or hemodynamically significant stenosis of the visualized ascending aorta and aortic arch. Conventional 3 vessel aortic branching pattern. The visualized proximal subclavian arteries are widely patent. Right carotid system: Moderate narrowing of the distal right common carotid artery, measuring 50%. There is mild atherosclerotic calcification at the carotid bifurcation. No hemodynamically  significant stenosis of the remainder of the cervical right internal carotid artery. Left carotid system: The left common carotid origin is widely patent. There is no common carotid or internal carotid artery dissection or aneurysm. Mild atherosclerotic calcification at the carotid bifurcation without hemodynamically significant stenosis. Vertebral arteries: The vertebral system is right dominant. There is narrowing of the right vertebral artery origin due to atherosclerotic calcification. The remainder of the right vertebral artery is normal. Normal left vertebral artery. There is no bony spinal canal stenosis.  No lytic or blastic lesion. Other neck: Endotracheal tube tip is at the level of clavicular heads. Upper chest: Large left pleural effusion, incompletely visualized. Review of the MIP images confirms the above findings CTA HEAD FINDINGS Anterior circulation: --Intracranial internal carotid arteries: There is atherosclerotic calcification of both internal carotid arteries at the skull base. There is severe stenosis of the right ICA at the proximal lacerum segment. There is severe left ICA stenosis at the distal cavernous and clinoid segment. These findings are unchanged. --Anterior cerebral arteries: Normal. --Middle cerebral arteries: Unchanged moderate-to-severe stenosis of the mid M1 segment of the right middle cerebral artery. Normal left MCA. --Posterior communicating arteries: Absent bilaterally. Posterior circulation: --Basilar artery: Normal. --Posterior cerebral arteries: Normal. --Superior cerebellar arteries: Normal. --Inferior cerebellar arteries: Normal posterior inferior cerebellar arteries. Anterior inferior cerebellar arteries are not clearly visible, but this is not uncommon. Venous sinuses: As permitted by contrast timing, patent. Anatomic variants: None Delayed phase: No parenchymal contrast enhancement. Review of the MIP images confirms the above findings. IMPRESSION: 1. No emergent large  vessel occlusion. 2. 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 3. Approximately 50% stenosis of the distal right common carotid artery, unchanged. 4. Large left pleural effusion. Electronically Signed   By: Deatra Robinson M.D.   On: 02/04/2018 01:42   Ct Angio Head W Or Wo Contrast  Result Date: 01/23/2018 CLINICAL DATA:  Initial evaluation for lethargy, difficulty to wake up. Status post CABG. EXAM: CT ANGIOGRAPHY HEAD AND NECK TECHNIQUE: Multidetector CT imaging of the head and neck was performed using the standard protocol during bolus administration of intravenous contrast. Multiplanar CT image reconstructions and MIPs were obtained to evaluate the vascular anatomy. Carotid stenosis measurements (when applicable) are obtained utilizing NASCET criteria, using the distal internal carotid diameter as the denominator. CONTRAST:  50mL ISOVUE-370 IOPAMIDOL (ISOVUE-370) INJECTION 76% COMPARISON:  Prior head CT from 01/02/2017. FINDINGS: CT HEAD FINDINGS Brain: Cerebral volume within normal limits for age. Scattered patchy hypodensity within the subcortical white matter of the right cerebral hemisphere most likely related to remote right  MCA territory infarct. No acute intracranial hemorrhage. Parenchymal hypodensity within the inferior left cerebellar hemisphere consistent with acute ischemic infarct, left PICA territory. No other acute large vessel territory infarct. No mass lesion, midline shift or mass effect. No hydrocephalus. No extra-axial fluid collection. Vascular: No hyperdense vessel. Skull: Scalp soft tissues and calvarium within normal limits. Sinuses: Paranasal sinuses are clear. No mastoid effusion. Orbits: Globes and oval soft tissues within normal limits. Review of the MIP images confirms the above findings CTA NECK FINDINGS Aortic arch: Visualized aortic arch of normal caliber with normal branch pattern.  Sequelae of recent CABG noted. No flow-limiting stenosis about the origin of the great vessels. Visualized subclavian arteries widely patent. Right carotid system: Right common carotid artery widely patent proximally. There is moderate atheromatous narrowing of approximately 50% at the distal right common carotid artery (series 12, image 228). A centric max plaque about the proximal right ICA with associated mild narrowing of approximately 35% by NASCET criteria. Right ICA widely patent distally to the skull base without stenosis, dissection, or occlusion. Left carotid system: Left common carotid artery patent from its origin to the bifurcation without stenosis. Mild calcified atheromatous plaque about the left bifurcation without flow-limiting stenosis. Left ICA patent distally to the skull base without flow-limiting stenosis. Vertebral arteries: Both of the vertebral arteries arise from the subclavian arteries. Calcified plaque at the origin of the vertebral arteries bilaterally with approximately 70% stenosis on the right and 50% stenosis on the left. Multifocal atheromatous irregularity seen throughout the vertebral arteries within the neck but remain patent to the skull base. Skeleton: No acute osseous abnormality. No worrisome lytic or blastic osseous lesions. Recent sternotomy noted. Other neck: No acute soft tissue abnormality within the neck. Right IJ approach central venous catheter. Upper chest: Sequelae of recent CABG. Layering bilateral pleural effusions with associated atelectatic changes noted. Partially visualized lungs are otherwise clear. Review of the MIP images confirms the above findings CTA HEAD FINDINGS Anterior circulation: Petrous segments patent bilaterally. There is extensive calcified atheromatous irregularity throughout the cavernous ICAs bilaterally, slightly progressive as compared to previous. Diameter at the proximal cavernous right ICA measures approximately 2 mm, similar to  previous. Focal stenosis at the proximal cavernous left ICA slightly worsened now measuring approximately 2 mm as well. Distal cavernous left ICA also measures approximately 2 mm, similar to previous. ICAs are patent to the termini. A1 segments irregular but patent. Patent anterior communicating artery. Anterior cerebral arteries patent to their distal aspects. Approximate moderate 50% stenosis at the proximal left M1 segment. Left M1 patent distally. Normal left MCA bifurcation. Distal left MCA branches perfuse. Distal small vessel atheromatous irregularity present throughout the MCA branches bilaterally. There is a focal severe near occlusive stenosis at the proximal right M1 segment, worsened from previous. While subocclusive thrombus would be difficult to exclude, this is less favored (series 12, image 99). Right M1 segment patent distally. No proximal right M2 occlusion. Distal right MCA branches well perfused. Posterior circulation: Moderate stenosis of the left vertebral artery as it crosses into the cranial vault. There is additional severe stenosis of the left V4 segment distally beyond the takeoff of the PICA. Left PICA is patent proximally. Approximate 50% stenosis within the right V4 segment prior to the vertebrobasilar junction. Right PICA patent as well. Basilar artery demonstrates multifocal atheromatous irregularity but is patent to its distal aspect. Superior cerebral arteries patent bilaterally. Both of the posterior cerebral arteries primarily supplied via the basilar. PCAs irregular with multifocal moderate to  severe stenoses but are patent to their distal aspects. Venous sinuses: Patent. Anatomic variants: None significant.  No aneurysm. Delayed phase: No abnormal enhancement. Review of the MIP images confirms the above findings IMPRESSION: 1. Negative CTA for large vessel occlusion. 2. 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 Left PICA is patent proximally. 3. 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 While subocclusive thrombus would be difficult to exclude, this is felt to be less likely. Query an episode of recent hypotension, which superimposed on the underlying stenosis could result in patient's left-sided symptoms. 4. Advanced atherosclerotic change affecting the cavernous ICAs bilaterally, left greater than right, slightly progressed from previous. 5. Short-segment 50% proximal left M1 stenosis. 6. Additional advanced for age diffuse atherosclerotic change throughout the extracranial intracranial circulation as above. Critical Value/emergent results were called by telephone at the time of interpretation on 01/23/2018 at 3:51 pm to Dr. Arther Dames, who verbally acknowledged these results. Electronically Signed   By: Rise Mu M.D.   On: 01/23/2018 16:19    Ct Head Wo Contrast  Result Date: 02/03/2018 CLINICAL DATA:  47 y/o  M; post CVA with mental status change. EXAM: CT HEAD WITHOUT CONTRAST TECHNIQUE: Contiguous axial images were obtained from the base of the skull through the vertex without intravenous contrast. COMPARISON:  01/25/2018 CT head FINDINGS: Brain: Several foci of hypoattenuation within right anterior insula as well as frontal, parietal, and temporal white matter are increased in conspicuity from the prior CT and probably represent areas of evolving acute infarction. Interval development of hypoattenuation throughout the mid and left aspects of the corpus callosum body and splenium which is new from the prior study and probably represents infarction. Infarction in the left cerebellum demonstrates a new region of hypoattenuation laterally and inferiorly (series 5, image 48) compatible with interval infarction. No hemorrhage or significant mass effect. Vascular: No hyperdense vessel or unexpected calcification. Skull: Normal. Negative for fracture or focal lesion. Sinuses/Orbits: No acute  finding. Other: None. IMPRESSION: 1. Increasing or new regions of hypoattenuation scattered throughout right MCA distribution white matter, involving body and splenium of corpus callosum, and in the left lateral cerebellar hemisphere adjacent to prior PICA infarction likely representing interval areas of acute infarction. 2. No hemorrhage or mass effect.  No midline shift. These results will be called to the ordering clinician or representative by the Radiologist Assistant, and communication documented in the PACS or zVision Dashboard. Electronically Signed   By: Mitzi Hansen M.D.   On: 02/03/2018 21:43   Ct Head Wo Contrast  Addendum Date: 01/25/2018   ADDENDUM REPORT: 01/25/2018 08:43 ADDENDUM: Original report by Dr. Chase Picket. Addendum by Dr. Mosetta Putt: The study was reviewed via telephone with Dr. Pearlean Brownie on 01/25/2018 at 8:37 a.m. The patient has left hemiparesis, and there are new patchy hypodensities in the right corona radiata and right caudate nucleus consistent with acute infarcts. Electronically Signed   By: Sebastian Ache M.D.   On: 01/25/2018 08:43   Result Date: 01/25/2018 CLINICAL DATA:  Altered mental status EXAM: CT HEAD WITHOUT CONTRAST TECHNIQUE: Contiguous axial images were obtained from the base of the skull through the vertex without intravenous contrast. COMPARISON:  01/23/2018 FINDINGS: Brain: Slight progression of cytotoxic edema at the left cerebellar infarct site. There is periventricular hypoattenuation compatible with chronic microvascular disease. No hemorrhage. Vascular: No hyperdense vessel or unexpected vascular calcification. Skull: Normal visualized skull base, calvarium and extracranial soft tissues. Sinuses/Orbits: No sinus fluid levels or  advanced mucosal thickening. No mastoid effusion. Normal orbits. IMPRESSION: Slight progression of cytotoxic edema at the left cerebellar infarct site. No hemorrhage or other new acute finding. Electronically Signed: By: Deatra Robinson M.D.  On: 01/25/2018 06:20   Ct Angio Neck W Or Wo Contrast  Result Date: 02/04/2018 CLINICAL DATA:  Altered mental status.  Recent CABG. EXAM: CT ANGIOGRAPHY HEAD AND NECK TECHNIQUE: Multidetector CT imaging of the head and neck was performed using the standard protocol during bolus administration of intravenous contrast. Multiplanar CT image reconstructions and MIPs were obtained to evaluate the vascular anatomy. Carotid stenosis measurements (when applicable) are obtained utilizing NASCET criteria, using the distal internal carotid diameter as the denominator. CONTRAST:  50mL ISOVUE-370 IOPAMIDOL (ISOVUE-370) INJECTION 76% COMPARISON:  CTA head neck 01/23/2018. FINDINGS: CTA NECK FINDINGS Aortic arch: There is no calcific atherosclerosis of the aortic arch. There is no aneurysm, dissection or hemodynamically significant stenosis of the visualized ascending aorta and aortic arch. Conventional 3 vessel aortic branching pattern. The visualized proximal subclavian arteries are widely patent. Right carotid system: Moderate narrowing of the distal right common carotid artery, measuring 50%. There is mild atherosclerotic calcification at the carotid bifurcation. No hemodynamically significant stenosis of the remainder of the cervical right internal carotid artery. Left carotid system: The left common carotid origin is widely patent. There is no common carotid or internal carotid artery dissection or aneurysm. Mild atherosclerotic calcification at the carotid bifurcation without hemodynamically significant stenosis. Vertebral arteries: The vertebral system is right dominant. There is narrowing of the right vertebral artery origin due to atherosclerotic calcification. The remainder of the right vertebral artery is normal. Normal left vertebral artery. There is no bony spinal canal stenosis.  No lytic or blastic lesion. Other neck: Endotracheal tube tip is at the level of clavicular heads. Upper chest: Large left pleural  effusion, incompletely visualized. Review of the MIP images confirms the above findings CTA HEAD FINDINGS Anterior circulation: --Intracranial internal carotid arteries: There is atherosclerotic calcification of both internal carotid arteries at the skull base. There is severe stenosis of the right ICA at the proximal lacerum segment. There is severe left ICA stenosis at the distal cavernous and clinoid segment. These findings are unchanged. --Anterior cerebral arteries: Normal. --Middle cerebral arteries: Unchanged moderate-to-severe stenosis of the mid M1 segment of the right middle cerebral artery. Normal left MCA. --Posterior communicating arteries: Absent bilaterally. Posterior circulation: --Basilar artery: Normal. --Posterior cerebral arteries: Normal. --Superior cerebellar arteries: Normal. --Inferior cerebellar arteries: Normal posterior inferior cerebellar arteries. Anterior inferior cerebellar arteries are not clearly visible, but this is not uncommon. Venous sinuses: As permitted by contrast timing, patent. Anatomic variants: None Delayed phase: No parenchymal contrast enhancement. Review of the MIP images confirms the above findings. IMPRESSION: 1. No emergent large vessel occlusion. 2. 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 3. Approximately 50% stenosis of the distal right common carotid artery, unchanged. 4. Large left pleural effusion. Electronically Signed   By: Deatra Robinson M.D.   On: 02/04/2018 01:42   Ct Angio Neck W Or Wo Contrast  Result Date: 01/23/2018 CLINICAL DATA:  Initial evaluation for lethargy, difficulty to wake up. Status post CABG. EXAM: CT ANGIOGRAPHY HEAD AND NECK TECHNIQUE: Multidetector CT imaging of the head and neck was performed using the standard protocol during bolus administration of intravenous contrast. Multiplanar CT image reconstructions and MIPs were obtained to  evaluate the vascular anatomy. Carotid stenosis  measurements (when applicable) are obtained utilizing NASCET criteria, using the distal internal carotid diameter as the denominator. CONTRAST:  50mL ISOVUE-370 IOPAMIDOL (ISOVUE-370) INJECTION 76% COMPARISON:  Prior head CT from 01/02/2017. FINDINGS: CT HEAD FINDINGS Brain: Cerebral volume within normal limits for age. Scattered patchy hypodensity within the subcortical white matter of the right cerebral hemisphere most likely related to remote right MCA territory infarct. No acute intracranial hemorrhage. Parenchymal hypodensity within the inferior left cerebellar hemisphere consistent with acute ischemic infarct, left PICA territory. No other acute large vessel territory infarct. No mass lesion, midline shift or mass effect. No hydrocephalus. No extra-axial fluid collection. Vascular: No hyperdense vessel. Skull: Scalp soft tissues and calvarium within normal limits. Sinuses: Paranasal sinuses are clear. No mastoid effusion. Orbits: Globes and oval soft tissues within normal limits. Review of the MIP images confirms the above findings CTA NECK FINDINGS Aortic arch: Visualized aortic arch of normal caliber with normal branch pattern. Sequelae of recent CABG noted. No flow-limiting stenosis about the origin of the great vessels. Visualized subclavian arteries widely patent. Right carotid system: Right common carotid artery widely patent proximally. There is moderate atheromatous narrowing of approximately 50% at the distal right common carotid artery (series 12, image 228). A centric max plaque about the proximal right ICA with associated mild narrowing of approximately 35% by NASCET criteria. Right ICA widely patent distally to the skull base without stenosis, dissection, or occlusion. Left carotid system: Left common carotid artery patent from its origin to the bifurcation without stenosis. Mild calcified atheromatous plaque about the left bifurcation without  flow-limiting stenosis. Left ICA patent distally to the skull base without flow-limiting stenosis. Vertebral arteries: Both of the vertebral arteries arise from the subclavian arteries. Calcified plaque at the origin of the vertebral arteries bilaterally with approximately 70% stenosis on the right and 50% stenosis on the left. Multifocal atheromatous irregularity seen throughout the vertebral arteries within the neck but remain patent to the skull base. Skeleton: No acute osseous abnormality. No worrisome lytic or blastic osseous lesions. Recent sternotomy noted. Other neck: No acute soft tissue abnormality within the neck. Right IJ approach central venous catheter. Upper chest: Sequelae of recent CABG. Layering bilateral pleural effusions with associated atelectatic changes noted. Partially visualized lungs are otherwise clear. Review of the MIP images confirms the above findings CTA HEAD FINDINGS Anterior circulation: Petrous segments patent bilaterally. There is extensive calcified atheromatous irregularity throughout the cavernous ICAs bilaterally, slightly progressive as compared to previous. Diameter at the proximal cavernous right ICA measures approximately 2 mm, similar to previous. Focal stenosis at the proximal cavernous left ICA slightly worsened now measuring approximately 2 mm as well. Distal cavernous left ICA also measures approximately 2 mm, similar to previous. ICAs are patent to the termini. A1 segments irregular but patent. Patent anterior communicating artery. Anterior cerebral arteries patent to their distal aspects. Approximate moderate 50% stenosis at the proximal left M1 segment. Left M1 patent distally. Normal left MCA bifurcation. Distal left MCA branches perfuse. Distal small vessel atheromatous irregularity present throughout the MCA branches bilaterally. There is a focal severe near occlusive stenosis at the proximal right M1 segment, worsened from previous. While subocclusive thrombus  would be difficult to exclude, this is less favored (series 12, image 99). Right M1 segment patent distally. No proximal right M2 occlusion. Distal right MCA branches well perfused. Posterior circulation: Moderate stenosis of the left vertebral artery as it crosses into the cranial vault. There is additional severe stenosis of the left V4 segment distally beyond  the takeoff of the PICA. Left PICA is patent proximally. Approximate 50% stenosis within the right V4 segment prior to the vertebrobasilar junction. Right PICA patent as well. Basilar artery demonstrates multifocal atheromatous irregularity but is patent to its distal aspect. Superior cerebral arteries patent bilaterally. Both of the posterior cerebral arteries primarily supplied via the basilar. PCAs irregular with multifocal moderate to severe stenoses but are patent to their distal aspects. Venous sinuses: Patent. Anatomic variants: None significant.  No aneurysm. Delayed phase: No abnormal enhancement. Review of the MIP images confirms the above findings IMPRESSION: 1. Negative CTA for large vessel occlusion. 2. 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 Left PICA is patent proximally. 3. 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 While subocclusive thrombus would be difficult to exclude, this is felt to be less likely. Query an episode of recent hypotension, which superimposed on the underlying stenosis could result in patient's left-sided symptoms. 4. Advanced atherosclerotic change affecting the cavernous ICAs bilaterally, left greater than right, slightly progressed from previous. 5. Short-segment 50% proximal left M1 stenosis. 6. Additional advanced for age diffuse atherosclerotic change throughout the extracranial intracranial circulation as above. Critical Value/emergent results were called by telephone at the time of interpretation on 01/23/2018 at 3:51 pm to Dr. Arther DamesSUSHANTH AROOR,  who verbally acknowledged these results. Electronically Signed   By: Rise MuBenjamin  McClintock M.D.   On: 01/23/2018 16:19   Ct Cerebral Perfusion W Contrast  Result Date: 01/23/2018 CLINICAL DATA:  Initial evaluation for focal neural deficit, stroke suspected. EXAM: CT PERFUSION BRAIN TECHNIQUE: Multiphase CT imaging of the brain was performed following IV bolus contrast injection. Subsequent parametric perfusion maps were calculated using RAPID software. CONTRAST:  50mL ISOVUE-370 IOPAMIDOL (ISOVUE-370) INJECTION 76% COMPARISON:  Prior CTA from earlier the same day. FINDINGS: CT Brain Perfusion Findings: CBF (<30%) Volume: 0mL Perfusion (Tmax>6.0s) volume: 0mL Mismatch Volume: 0mL Infarction Location:None. Scattered areas of elevated T max greater than 4 seconds within the right watershed distribution, likely related to previous identified right M1 stenosis. IMPRESSION: No evidence for acute ischemia by CT perfusion. Electronically Signed   By: Rise MuBenjamin  McClintock M.D.   On: 01/23/2018 19:53   Dg Chest Port 1 View  Result Date: 02/04/2018 CLINICAL DATA:  Endotracheal tube placement. EXAM: PORTABLE CHEST 1 VIEW COMPARISON:  Chest radiograph performed 02/03/2018 FINDINGS: The patient's endotracheal tube is seen ending 4-5 cm above the carina. An enteric tube is noted extending below the diaphragm. A small left pleural effusion is noted. Left basilar airspace opacity may reflect pneumonia. No pneumothorax is seen. The cardiomediastinal silhouette is mildly enlarged. The patient is status post median sternotomy, with evidence of prior CABG. No acute osseous abnormalities are identified. IMPRESSION: 1. Endotracheal tube seen ending 4-5 cm above the carina. 2. Small left pleural effusion. Left basilar airspace opacity may reflect pneumonia. Electronically Signed   By: Roanna RaiderJeffery  Chang M.D.   On: 02/04/2018 01:10     PHYSICAL EXAM  Temp:  [98.4 F (36.9 C)-102.9 F (39.4 C)] 100.2 F (37.9 C) (03/15  0730) Pulse Rate:  [81-101] 97 (03/15 0750) Resp:  [14-24] 19 (03/15 0750) BP: (83-163)/(48-103) 148/81 (03/15 0750) SpO2:  [97 %-100 %] 98 % (03/15 0928) FiO2 (%):  [30 %-40 %] 30 % (03/15 0750) Weight:  [308 lb 10.3 oz (140 kg)] 308 lb 10.3 oz (140 kg) (03/15 0600)  General - Well nourished, well developed, intubated not on pressor.  Ophthalmologic - fundi not visualized due to  noncooperation.  Cardiovascular - Regular rate and rhythm.  Neuro - intubated not on sedation, but on pressors. Not open eyes on voice or pain, PERRL, disconjugate eyes, right at mid position, left eye more down and out, no doll's eyes. No tracking and no blinking to visual threat. No corneal on the left, positive corneal on the right. Weak gag and cough reflexes. On pain stimulation, no movement LUE but trace withdraw on the RUE and 1/5 LLE but 0/5 RLE. DTR diminished and no babinskis. Sensation, coordination and gait not tested.    ASSESSMENT/PLAN Mr. Elwood Bazinet is a 47 y.o. male with history of CAD, DM, HTN, cardiomyopathy, obesity, OSA, and a stroke in 12/2016 admitted for CABG on 01/30/2018.  Became lethargic second day, with left-sided weakness and left-sided neglect, CT showed right MCA stroke.  However, developed sepsis with septic shock on 02/03/18 followed by unresponsiveness.  neurology was reconsulted.  No tPA given due to out of window.    Stroke :  Right MCA, b/l corpus callosum, left SCA infarcts, likely due to severe intracranial stenosis in the setting of septic shock  Resultant unresponsiveness, intubated  MRI/MRA - not able to perform due to recent CABG  CTA head and neck right M1 severe stenosis, bilateral cavernous ICA high-grade stenosis, right CCA 50% stenosis  Lovenox for VTE prophylaxis Fall precautions  Diet NPO time specified   aspirin 300 mg suppository daily prior to admission, now on aspirin 325 mg daily and clopidogrel 75 mg daily.   Ongoing aggressive stroke risk factor  management  Therapy recommendations:  Pending   Disposition:  Pending  Poor prognosis  Recent stroke  01/23/18 acute left sided weakness, left neglect with lethargy  CT showed left PICA new infarct  CT repeat showed right MCA infarct   CTA head and neck showed right M1 and M2 stenosis  CT perfusion no penumbra  EEG negative for seizure  EF 55-60%  LDL 36 and A1c 7.2  Put on aspirin 300 PR and Lipitor at that time  History of stroke  12/2016 left lateral medullary infarct  CT head and neck showed bilateral VA stenosis, carotid siphons atherosclerosis, 50% bilateral MCA stenosis  EF 55-60%  LDL 168, A1c 9.5  Patient was put on dual antiplatelet and Lipitor  Septic shock  Fever Tmax 102.9  Leukocytosis WBC 20.3->17.7  Hype prolonged hypotension, requiring pressors  On cefepime and vancomycin  Blood culture NGTD  Intracranial stenosis  CTA head neck x2 confirmed bilateral cavernous ICA high-grade stenosis, right OM1 and M2 severe stenosis, right CCA 50% stenosis  On dual antiplatelet   CAD s/p CABG  Cardiology on board  On bidil  Diabetes  HgbA1c 7.2 goal < 7.0  Uncontrolled  Hyperglycemia  Currently on Levemir  CBG monitoring  SSI  Hyperlipidemia  Home meds: Lipitor  LDL 36, goal < 70  Continue statin at discharge  Other Stroke Risk Factors  Obesity, Body mass index is 39.61 kg/m.   Obstructive sleep apnea, on CPAP at home  Other Active Problems  Hypernatremia  AKI -  creatinine 1.71  Hospital day # 14  This patient is critically ill due to large right MCA stroke, bilateral corpus callosum stroke, unresponsiveness, septic shock, CAD status post CABG and at significant risk of neurological worsening, death form septic shock, hemorrhagic conversion, seizure, heart failure, aspiration pneumonia. This patient's care requires constant monitoring of vital signs, hemodynamics, respiratory and cardiac monitoring, review of  multiple databases, neurological assessment, discussion with family, other  specialists and medical decision making of high complexity. Prognosis poor. I spent 35 minutes of neurocritical care time in the care of this patient.   Marvel Plan, MD PhD Stroke Neurology 02/05/2018 10:39 AM    To contact Stroke Continuity provider, please refer to [email or web address]. After hours, contact General Neurology

## 2018-02-05 NOTE — Progress Notes (Signed)
CSW continuing to follow and assist with disposition as able.  Pt currently intubated on vent- CSW will continue to work with family on placement efforts when pt is more medically stable  Burna SisJenna H. Sharmeka Palmisano, LCSW Clinical Social Worker [phone]

## 2018-02-05 NOTE — Progress Notes (Signed)
      [address]       [place] [id number]             [phone]      Reviewed CT chest and discussed with Dr. Fredia SorrowYamagata. He has dense consolidation of left lower lobe c/w pneumonia. There is a small loculated effusion. This would be extremely difficult to place a "blind" tube at bedside and he feels it would also be difficult with CT guidance.   Will follow for now but may require chest tube at some point. If fevers persist will repeat CT using contrast early next week.  Salvatore DecentSteven C. Dorris FetchHendrickson, MD [facility] [phone]

## 2018-02-05 NOTE — Progress Notes (Signed)
Patient ID: Nicholas Mejia, male   DOB: Feb 22, 1971, 47 y.o.   MRN: [id number] EVENING ROUNDS NOTE :     [address]       [place], [id number]             [phone]                 14 Days Post-Op Procedure(s) (LRB): CORONARY ARTERY BYPASS GRAFTING (CABG) TIMES Four,  USING LEFT INTERNAL MAMMARY ARTERY, RIGHT RADIAL ARTERY AND RIGHT SAPHENOUS LEG VEIN HARVESTED ENDOSCOPICALLY (N/A) RADIAL ARTERY HARVEST (Right) TRANSESOPHAGEAL ECHOCARDIOGRAM (TEE) (N/A)  Total Length of Stay:  LOS: 14 days  BP 123/66 (BP Location: Right Arm)   Pulse 95   Temp (!) 101.6 F (38.7 C) (Oral)   Resp 20   Ht 6' 2.02" (1.88 m)   Wt (!) 308 lb 10.3 oz (140 kg)   SpO2 98%   BMI 39.61 kg/m   .Intake/Output      03/14 0701 - 03/15 0700 03/15 0701 - 03/16 0700   I.V. (mL/kg) 1838.7 (13.1) 1042.8 (7.4)   NG/GT 1452.5 440   IV Piggyback 800 100   Total Intake(mL/kg) 4091.2 (29.2) 1582.8 (11.3)   Urine (mL/kg/hr) 3915 (1.2) 950 (0.6)   Total Output 3915 950   Net +176.2 +632.8          . ceFEPime (MAXIPIME) IV Stopped (02/05/18 1338)  . DOPamine 5.018 mcg/kg/min (02/05/18 1700)  . feeding supplement (JEVITY 1.2 CAL) 1,000 mL (02/05/18 1700)  . fentaNYL infusion INTRAVENOUS    . norepinephrine (LEVOPHED) Adult infusion    . phenylephrine (NEO-SYNEPHRINE) Adult infusion 220 mcg/min (02/05/18 1700)  . vancomycin Stopped (02/05/18 1601)     Lab Results  Component Value Date   WBC 17.7 (H) 02/05/2018   HGB 7.7 (L) 02/05/2018   HCT 27.3 (L) 02/05/2018   PLT 365 02/05/2018   GLUCOSE 280 (H) 02/05/2018   CHOL 83 01/24/2018   TRIG 80 01/24/2018   HDL 31 (L) 01/24/2018   LDLCALC 36 01/24/2018   ALT 73 (H) 02/04/2018   AST 161 (H) 02/04/2018   NA 156 (H) 02/05/2018   K 3.6 02/05/2018   CL 125 (H) 02/05/2018   CREATININE 1.38 (H) 02/05/2018   BUN 55 (H) 02/05/2018   CO2 26 02/05/2018   TSH 0.303 (L) 02/04/2018   INR 1.33 2018/03/04   HGBA1C 7.2 (H) 01/24/2018   Now sedated on  vent    Delight OvensEdward B Zia Kanner MD  Beeper [phone] Office [phone] 02/05/2018 5:32 PM

## 2018-02-05 NOTE — Plan of Care (Signed)
Patient comatose state. Decerebrate to stimulation , no purposeful movement. Febrile, on antibiotics, rhonchi , deep tissue injury buttocks area. Wound care consult. Neo at 210 mcg.min, dopamine at 675mcg/kg/min.  Map 87-90. Plan: continue q2 oral care, CM consult, ?Placement  trach /peg/LTACH ?SNFversus pallative,  Mother Management consultantdecision maker.

## 2018-02-05 NOTE — Consult Note (Signed)
WOC Nurse wound consult note Reason for Consult: Deep tissue injury to coccyx Moisture associated skin damage (MASD) to abdominal pannus.. Scrotal edema and MASD in this region as well Is currently on mattress with low air loss feature.  Wound type:Pressure and moisture Pressure Injury POA: No Measurement: Coccyx:  4 cm x 3.4 cm with peeling epithelium to center revealing maroon discoloration Pink and moist denuded skin to abdominal pannus and periscrotal skin, protected with wicking linen.  Wound ZOX:WRUEAVbed:maroon discoloration Drainage (amount, consistency, odor) scant weeping Periwound:blanchable erythema to periwound skin on coccyx Dressing procedure/placement/frequency: Cleanse coccyx with soap and water.  Apply barrier cream twice daily and PRN soilage.  Offload heels with PRevalon boots Interdry Ag to abdominal pannus:  Measure and cut length of InterDry Ag+ to fit in skin folds that have skin breakdown Tuck InterDry  Ag+ fabric into skin folds in a single layer, allow for 2 inches of overhang from skin edges to allow for wicking to occur May remove to bathe; dry area thoroughly and then tuck into affected areas again Do not apply any creams or ointments when using InterDry Ag+ DO NOT THROW AWAY FOR 5 DAYS unless soiled with stool DO NOT [facility] IncWASH product, this will inactivate the silver in the material  New sheet of Interdry Ag+ should be applied after 5 days of use if patient continues to have skin breakdown   Will not follow at this time.  Please re-consult if needed.  Maple HudsonKaren Roselene Gray RN BSN CWON Pager [phone]

## 2018-02-05 NOTE — Procedures (Deleted)
Extubation Procedure Note  Patient Details:   Name: Nicholas Mejia DOB: 1971-02-28 MRN: [id number]   Airway Documentation:     Evaluation  O2 sats: stable throughout Complications: No apparent complications Patient did tolerate procedure well. Bilateral Breath Sounds: Diminished   Yes   Pt extubated to 3L [place] per MD order. PT stable throughout with no complications, VS within normal limits. Positive Cuff leak noted prior to extubation. Pt able to speak and has a weak productive cough post extubation. Pt encouraged to use [place] to clear secretions and IS to help encourage deep breathing. RT will continue to monitor.   Carolan ShiverKelley, Evolett Somarriba M 02/05/2018, 9:33 AM

## 2018-02-05 NOTE — Progress Notes (Signed)
PULMONARY / CRITICAL CARE MEDICINE   Name: Nicholas Mejia MRN: [id number] DOB: 26-Apr-1971    ADMISSION DATE:  2018/02/18 CONSULTATION DATE:  3/14  REFERRING MD:  Dr. Dorris Fetch, Dr. Laurence Slate  CHIEF COMPLAINT:  Obtundation, CVA  HISTORY OF PRESENT ILLNESS:   47 yo male former smoker with 3 vessel CAD and EF 40%.  Had CABG 3/01.  Altered mental status and Lt sided weakness post op.  Found to have new Lt PICA and Rt subcortical MCA infarcts from intraoperative hypotension with stenosed MCA.  Mental status deteriorated further 3/13 and required intubation for airway protection. PMHx of CVA, CAD, HTN, OSA, DM, GERD  SUBJECTIVE:  Remains on full vent support.  VITAL SIGNS: BP (!) 148/81   Pulse 97   Temp 100.2 F (37.9 C) (Oral)   Resp 19   Ht 6' 2.02" (1.88 m)   Wt (!) 308 lb 10.3 oz (140 kg)   SpO2 99%   BMI 39.61 kg/m   VENTILATOR SETTINGS: Vent Mode: PRVC FiO2 (%):  [30 %-40 %] 30 % Set Rate:  [14 bmp] 14 bmp Vt Set:  [660 mL] 660 mL PEEP:  [5 cmH20] 5 cmH20 Plateau Pressure:  [21 cmH20-22 cmH20] 22 cmH20  INTAKE / OUTPUT: I/O last 3 completed shifts: In: 6877.3 [I.V.:2494.8; NG/GT:2782.5; IV Piggyback:1600] Out: 5870 [Urine:5870]  PHYSICAL EXAMINATION:  General - unresponsive Eyes - Rtward eye deviation ENT - ETT in plance Cardiac - regular, no murmur Chest - decreased BS on Lt Abd - soft, non tender Ext - no edema Skin - no rashes Neuro - not following commands   LABS:  BMET Recent Labs  Lab 02/03/18 0419 02/04/18 0306 02/05/18 0441  NA 156* 151* 156*  K 4.6 3.6 3.6  CL 116* 118* 125*  CO2 31 26 26   BUN 52* 65* 55*  CREATININE 1.48* 1.71* 1.38*  GLUCOSE 166* 241* 280*    Electrolytes Recent Labs  Lab 02/03/18 0419 02/04/18 0306 02/05/18 0441  CALCIUM 8.3* 6.6* 6.6*  MG  --  2.7*  --   PHOS  --  4.0  --     CBC Recent Labs  Lab 02/03/18 0419 02/04/18 0306 02/05/18 0441  WBC 15.7* 20.3* 17.7*  HGB 9.1* 7.5* 7.7*  HCT 31.9* 26.4*  27.3*  PLT 420* 348 365    Coag's No results for input(s): APTT, INR in the last 168 hours.  Sepsis Markers No results for input(s): LATICACIDVEN, PROCALCITON, O2SATVEN in the last 168 hours.  ABG Recent Labs  Lab 02/03/18 2017 02/04/18 0152 02/05/18 0335  PHART 7.402 7.375 7.427  PCO2ART 51.5* 56.7* 46.4  PO2ART 92.0 118.0* 91.8    Liver Enzymes Recent Labs  Lab 02/04/18 0306  AST 161*  ALT 73*  ALKPHOS 49  BILITOT 1.4*  ALBUMIN 2.1*    Cardiac Enzymes No results for input(s): TROPONINI, PROBNP in the last 168 hours.  Glucose Recent Labs  Lab 02/04/18 0742 02/04/18 1234 02/04/18 1555 02/04/18 1949 02/05/18 0230 02/05/18 0728  GLUCAP 129* 221* 224* 238* 222* 188*    Imaging No results found.   STUDIES:  CT head 3/2 > Negative CTA for large vessel occlusion, Small volume acute ischemic left PICA territory infarct. 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 While subocclusive thrombus would be difficult to exclude, this is felt to be less likely. Query an episode of recent hypotension. Advanced atherosclerotic change affecting the cavernous ICAs bilaterally, left greater than right, slightly progressed from previous.  Short-segment 50% proximal left M1 stenosis. CT head 3/4 > new patchy hypodensities in the right corona radiata and right caudate nucleus consistent with acute infarcts. CT head 3/13 > Increasing or new regions of hypoattenuation scattered throughout right MCA distribution white matter, involving body and splenium of corpus callosum, and in the left lateral cerebellar hemisphere adjacent to prior PICA infarction likely representing interval areas of acute infarction. CTA head 3/14 >No emergent large vessel occlusion.  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  Approximately 50% stenosis of the distal  right common carotid artery, unchanged. EEG 3/14 > diffuse slowing, focal slowing in Rt hemisphere compared to Lt  CULTURES: Blood 3/13 > Aerococcus Urine 3/13 > Tracheal aspirate 3/13 >  ANTIBIOTICS: Cefepime 3/13 > Vancomycin 3/13 >  SIGNIFICANT EVENTS: 3/1 admit for CABG 3/2 lethargy, new stroke identified 3/13 worsening lethargy after about a weak of slow progression.  3/14 intubated for airway protection.   LINES/TUBES: PICC 3/2 > Cortrak 3/12 > ETT 3/14 >  DISCUSSION: Remains unresponsive.  Has persistent Lt effusion and ASD.  Going for CT chest and possible chest tube afterward.  Na level up.  ASSESSMENT / PLAN:  Acute on chronic hypoxic/hypercapnic respiratory failure. OSA. - full vent support - mental status precludes weaning attempts - f/u CT chest  Acute metabolic encephalopathy. CVA. - continue ASA, plavix - neuro following  CAD s/p CABG. Acute on chronic systolic CHF. Hx of HTN. - per TCTS  Possible HCAP. - continue current ABx pending culture results  Hypernatremia. CKD 2. - free water increased - f/u BMET  Anemia of critical illness. - f/u CBC  DM type II. - SSI with levemir  DVT prophylaxis - lovenox SUP - protonix Nutrition - tube feeds Goals of care - full code  CC time 31 minutes  Coralyn HellingVineet Arely Tinner, MD [facility] 02/05/2018, 8:35 AM Pager:  [phone] After 3pm call: [phone]

## 2018-02-06 ENCOUNTER — Inpatient Hospital Stay (HOSPITAL_COMMUNITY): Payer: BLUE CROSS/BLUE SHIELD

## 2018-02-06 LAB — CBC
HCT: 31.1 % — ABNORMAL LOW (ref 39.0–52.0)
HEMATOCRIT: 24.4 % — AB (ref 39.0–52.0)
HEMOGLOBIN: 6.8 g/dL — AB (ref 13.0–17.0)
Hemoglobin: 8.9 g/dL — ABNORMAL LOW (ref 13.0–17.0)
MCH: 28.8 pg (ref 26.0–34.0)
MCH: 29.3 pg (ref 26.0–34.0)
MCHC: 27.9 g/dL — AB (ref 30.0–36.0)
MCHC: 28.6 g/dL — ABNORMAL LOW (ref 30.0–36.0)
MCV: 103.4 fL — AB (ref 78.0–100.0)
MCV: 102.3 fL — ABNORMAL HIGH (ref 78.0–100.0)
PLATELETS: 329 10*3/uL (ref 150–400)
Platelets: 318 10*3/uL (ref 150–400)
RBC: 2.36 MIL/uL — AB (ref 4.22–5.81)
RBC: 3.04 MIL/uL — AB (ref 4.22–5.81)
RDW: 15.3 % (ref 11.5–15.5)
RDW: 15.7 % — ABNORMAL HIGH (ref 11.5–15.5)
WBC: 14 10*3/uL — ABNORMAL HIGH (ref 4.0–10.5)
WBC: 12.2 10*3/uL — AB (ref 4.0–10.5)

## 2018-02-06 LAB — GLUCOSE, CAPILLARY
GLUCOSE-CAPILLARY: 282 mg/dL — AB (ref 65–99)
GLUCOSE-CAPILLARY: 277 mg/dL — AB (ref 65–99)
GLUCOSE-CAPILLARY: 222 mg/dL — AB (ref 65–99)
Glucose-Capillary: 246 mg/dL — ABNORMAL HIGH (ref 65–99)
Glucose-Capillary: 248 mg/dL — ABNORMAL HIGH (ref 65–99)
Glucose-Capillary: 282 mg/dL — ABNORMAL HIGH (ref 65–99)

## 2018-02-06 LAB — BASIC METABOLIC PANEL
ANION GAP: 8 (ref 5–15)
BUN: 54 mg/dL — AB (ref 6–20)
CHLORIDE: 126 mmol/L — AB (ref 101–111)
CO2: 26 mmol/L (ref 22–32)
Calcium: 7.5 mg/dL — ABNORMAL LOW (ref 8.9–10.3)
Creatinine, Ser: 1.6 mg/dL — ABNORMAL HIGH (ref 0.61–1.24)
GFR calc Af Amer: 57 mL/min — ABNORMAL LOW (ref >60)
GFR, EST NON AFRICAN AMERICAN: 49 mL/min — AB (ref >60)
GLUCOSE: 322 mg/dL — AB (ref 65–99)
POTASSIUM: 4.1 mmol/L (ref 3.5–5.1)
Sodium: 160 mmol/L — ABNORMAL HIGH (ref 135–145)

## 2018-02-06 LAB — VANCOMYCIN, TROUGH: Vancomycin Tr: 17 ug/mL (ref 15–20)

## 2018-02-06 LAB — PREPARE RBC (CROSSMATCH)

## 2018-02-06 MED ORDER — SODIUM CHLORIDE 0.9 % IV SOLN
Freq: Once | INTRAVENOUS | Status: AC
  Administered 2018-02-06: 06:00:00 via INTRAVENOUS

## 2018-02-06 MED ORDER — INSULIN DETEMIR 100 UNIT/ML ~~LOC~~ SOLN
50.0000 [IU] | Freq: Two times a day (BID) | SUBCUTANEOUS | Status: DC
  Administered 2018-02-06 (×2): 50 [IU] via SUBCUTANEOUS
  Filled 2018-02-06 (×3): qty 0.5

## 2018-02-06 MED ORDER — FREE WATER
200.0000 mL | Status: DC
Start: 2018-02-06 — End: 2018-02-08
  Administered 2018-02-06 – 2018-02-08 (×12): 200 mL

## 2018-02-06 MED ORDER — SALINE SPRAY 0.65 % NA SOLN
1.0000 | NASAL | Status: DC | PRN
  Administered 2018-02-06: 1 via NASAL
  Filled 2018-02-06: qty 44

## 2018-02-06 MED ORDER — DEXTROSE 5 % IV SOLN
INTRAVENOUS | Status: DC
  Administered 2018-02-06 – 2018-02-07 (×2): via INTRAVENOUS

## 2018-02-06 NOTE — Progress Notes (Signed)
Cooling blanket on patient. Temp 39.4 rectally.  Ice bags off for now since cooling blanket on

## 2018-02-06 NOTE — Progress Notes (Signed)
Moderate amount of yellow drainage from right nare. Suctioned out right nare.

## 2018-02-06 NOTE — Progress Notes (Signed)
Patient ID: Nicholas Mejia, male   DOB: 14-Aug-1971, 47 y.o.   MRN: [id number] EVENING ROUNDS NOTE :     [address]       [place] [id number]             [phone]                 15 Days Post-Op Procedure(s) (LRB): CORONARY ARTERY BYPASS GRAFTING (CABG) TIMES Four,  USING LEFT INTERNAL MAMMARY ARTERY, RIGHT RADIAL ARTERY AND RIGHT SAPHENOUS LEG VEIN HARVESTED ENDOSCOPICALLY (N/A) RADIAL ARTERY HARVEST (Right) TRANSESOPHAGEAL ECHOCARDIOGRAM (TEE) (N/A)  Total Length of Stay:  LOS: 15 days  BP 131/69 (BP Location: Right Arm)   Pulse (!) 107   Temp (!) 102.2 F (39 C) (Oral)   Resp (!) 25   Ht 6' 2.02" (1.88 m)   Wt (!) 311 lb 1.1 oz (141.1 kg)   SpO2 99%   BMI 39.92 kg/m   .Intake/Output      03/15 0701 - 03/16 0700 03/16 0701 - 03/17 0700   I.V. (mL/kg) 2204.6 (15.6) 1092.4 (7.7)   Blood 30 600   Other 140    NG/GT 1840 880   IV Piggyback 700 100   Total Intake(mL/kg) 4914.6 (34.8) 2672.4 (18.9)   Urine (mL/kg/hr) 2650 (0.8) 730 (0.4)   Emesis/NG output 245 500   Total Output 2895 1230   Net +2019.6 +1442.4          . ceFEPime (MAXIPIME) IV Stopped (02/06/18 1505)  . dextrose 50 mL/hr at 02/06/18 1800  . DOPamine 5.018 mcg/kg/min (02/06/18 1800)  . feeding supplement (JEVITY 1.2 CAL) 1,000 mL (02/06/18 1800)  . fentaNYL infusion INTRAVENOUS    . norepinephrine (LEVOPHED) Adult infusion 15.04 mcg/min (02/06/18 1800)  . phenylephrine (NEO-SYNEPHRINE) Adult infusion 140 mcg/min (02/06/18 1800)  . vancomycin 1,250 mg (02/06/18 1529)     Lab Results  Component Value Date   WBC 12.2 (H) 02/06/2018   HGB 8.9 (L) 02/06/2018   HCT 31.1 (L) 02/06/2018   PLT 329 02/06/2018   GLUCOSE 322 (H) 02/06/2018   CHOL 83 01/24/2018   TRIG 80 01/24/2018   HDL 31 (L) 01/24/2018   LDLCALC 36 01/24/2018   ALT 73 (H) 02/04/2018   AST 161 (H) 02/04/2018   NA 160 (H) 02/06/2018   K 4.1 02/06/2018   CL 126 (H) 02/06/2018   CREATININE 1.60 (H) 02/06/2018   BUN 54  (H) 02/06/2018   CO2 26 02/06/2018   TSH 0.303 (L) 02/04/2018   INR 1.33 01/28/2018   HGBA1C 7.2 (H) 01/24/2018   Febrile on cooling blankets unresponsive   Delight OvensEdward B Jaloni Davoli MD  Beeper [phone] Office [phone] 02/06/2018 6:57 PM

## 2018-02-06 NOTE — Progress Notes (Signed)
STROKE TEAM PROGRESS NOTE   History Per Consult Note 02/04/2018 [name] is a 47 year old male status post CABG postop day 12 who was seen by neurology on 03/02/2019after developing left-sided weakness following his surgery. 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 Etiology of stroke was thought to be watershed due to preoperative hypotension in the setting of severe ICAD.  Neurology signed off with recommendations to keep blood pressure between 120 and 180 systolic, continue aspirin and PT/OT.  Patient continued to improve,  however yesterday became febrile due to sepsis and subsequently hypotensive today requiring pressors. The nurse on call tonight stated that patient was following commands an on leaving or shift 7 in the morning, however on returning back at 7 PM noted patient was nonverbal and unresponsive. After discussing the CT surgery, which is the patient's primary team a head CT was ordered which showed new infarcts compared to prior CT done on 01/23/18 and  01/25/18. Official read stated. " Increasing or new regions of hypoattenuation scattered throughoutright MCA distribution white matter, involving body and splenium of corpus callosum, and in the left lateral cerebellar hemisphereadjacent to prior PICA infarction likely representing interval areas of acute infarction."     SUBJECTIVE (INTERVAL HISTORY) His brother and son at bedside. Pt still intubated and not on sedation. Not responsive. No significant neuro change overnight.    OBJECTIVE Temp:  [101.5 F (38.6 C)-102.9 F (39.4 C)] 102.3 F (39.1 C) (03/16 1152) Pulse Rate:  [90-116] 106 (03/16 1200) Cardiac Rhythm: Normal sinus rhythm (03/16 1200) Resp:  [14-27] 27 (03/16 1200) BP: (111-178)/(56-135) 115/65 (03/16 1200) SpO2:  [95 %-100 %] 97 % (03/16 1200) FiO2 (%):  [30 %] 30 % (03/16 1139) Weight:  [311 lb 1.1 oz (141.1 kg)] 311 lb 1.1 oz (141.1 kg) (03/16  0700)  Recent Labs  Lab 02/05/18 1925 02/05/18 2348 02/06/18 0359 02/06/18 0727 02/06/18 1151  GLUCAP 217* 246* 282* 282* 277*   Recent Labs  Lab 02/03/18 0419 02/04/18 0306 02/05/18 0441 02/06/18 1231  NA 156* 151* 156* 160*  K 4.6 3.6 3.6 4.1  CL 116* 118* 125* 126*  CO2 31 26 26 26   GLUCOSE 166* 241* 280* 322*  BUN 52* 65* 55* 54*  CREATININE 1.48* 1.71* 1.38* 1.60*  CALCIUM 8.3* 6.6* 6.6* 7.5*  MG  --  2.7*  --   --   PHOS  --  4.0  --   --    Recent Labs  Lab 02/04/18 0306  AST 161*  ALT 73*  ALKPHOS 49  BILITOT 1.4*  PROT 5.7*  ALBUMIN 2.1*   Recent Labs  Lab 02/03/18 0419 02/04/18 0306 02/05/18 0441 02/06/18 0216 02/06/18 1231  WBC 15.7* 20.3* 17.7* 14.0* 12.2*  NEUTROABS  --  16.7*  --   --   --   HGB 9.1* 7.5* 7.7* 6.8* 8.9*  HCT 31.9* 26.4* 27.3* 24.4* 31.1*  MCV 101.3* 100.4* 102.6* 103.4* 102.3*  PLT 420* 348 365 318 329   No results for input(s): CKTOTAL, CKMB, CKMBINDEX, TROPONINI in the last 168 hours. No results for input(s): LABPROT, INR in the last 72 hours. No results for input(s): COLORURINE, LABSPEC, PHURINE, GLUCOSEU, HGBUR, BILIRUBINUR, KETONESUR, PROTEINUR, UROBILINOGEN, NITRITE, LEUKOCYTESUR in the last 72 hours.  Invalid input(s): APPERANCEUR     Component Value Date/Time   CHOL 83 01/24/2018 0409   TRIG 80 01/24/2018 0409   HDL 31 (L) 01/24/2018 0409  CHOLHDL 2.7 01/24/2018 0409   VLDL 16 01/24/2018 0409   LDLCALC 36 01/24/2018 0409   Lab Results  Component Value Date   HGBA1C 7.2 (H) 01/24/2018      Component Value Date/Time   LABOPIA NONE DETECTED 12/30/2016 2146   COCAINSCRNUR NONE DETECTED 12/30/2016 2146   LABBENZ NONE DETECTED 12/30/2016 2146   AMPHETMU NONE DETECTED 12/30/2016 2146   THCU NONE DETECTED 12/30/2016 2146   LABBARB NONE DETECTED 12/30/2016 2146    No results for input(s): ETH in the last 168 hours.   IMAGING  I have personally reviewed the radiological images below and agree with the  radiology interpretations.  Ct Angio Head W Or Wo Contrast Ct Angio Neck W Or Wo Contrast 02/04/2018 IMPRESSION:  1. No emergent large vessel occlusion.  2. 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  3. Approximately 50% stenosis of the distal right common carotid artery, unchanged.  4. Large left pleural effusion.    Ct Angio Head W Or Wo Contrast Ct Angio Neck W Or Wo Contrast 01/23/2018 IMPRESSION:  1. Negative CTA for large vessel occlusion.  2. 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 Left PICA is patent proximally.  3. 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 While subocclusive thrombus would be difficult to exclude, this is felt to be less likely. Query an episode of recent hypotension, which superimposed on the underlying stenosis could result in patient's left-sided symptoms.  4. Advanced atherosclerotic change affecting the cavernous ICAs bilaterally, left greater than right, slightly progressed from previous.  5. Short-segment 50% proximal left M1 stenosis. 6. Additional advanced for age diffuse atherosclerotic change throughout the extracranial intracranial circulation as above.    Ct Head Wo Contrast 02/03/2018 IMPRESSION:  1. Increasing or new regions of hypoattenuation scattered throughout right MCA distribution white matter, involving body and splenium of corpus callosum, and in the left lateral cerebellar hemisphere adjacent to prior PICA infarction likely representing interval areas of acute infarction.  2. No hemorrhage or mass effect.  No midline shift.    Ct Head Wo Contrast 01/25/2018   ADDENDUM REPORT:  The patient has left hemiparesis, and there are new patchy hypodensities in the right corona radiata and right caudate nucleus consistent with acute infarcts.  IMPRESSION:  Slight progression of  cytotoxic edema at the left cerebellar infarct site. No hemorrhage or other new acute finding.    Ct Cerebral Perfusion W Contrast 01/23/2018 IMPRESSION:  No evidence for acute ischemia by CT perfusion.    Dg Chest Port 1 View 02/04/2018 IMPRESSION:  1. Endotracheal tube seen ending 4-5 cm above the carina.  2. Small left pleural effusion. Left basilar airspace opacity may reflect pneumonia.    CT Chest Wo Contrast 02/05/2018 IMPRESSION: 1. Left lower lobe collapse/consolidation with moderate left pleural effusion. 2. Mild atelectasis/infiltrate right lower lobe.    PHYSICAL EXAM  Vitals:   02/06/18 1000 02/06/18 1139 02/06/18 1152 02/06/18 1200  BP: 111/63 (!) 141/67  115/65  Pulse: (!) 107 98  (!) 106  Resp: 18 (!) 23  (!) 27  Temp: (!) 102.9 F (39.4 C)  (!) 102.3 F (39.1 C)   TempSrc: Oral  Oral   SpO2: 98% 97%  97%  Weight:      Height:        General - Well nourished, well developed, intubated not on  pressor.  Ophthalmologic - fundi not visualized due to noncooperation.  Cardiovascular - Regular rate and rhythm.  Neuro - intubated not on sedation, but on pressors. Not open eyes on voice or pain, PERRL, disconjugate eyes, right at mid position, left eye more down and out, no doll's eyes. No tracking and no blinking to visual threat. No corneal on the left, positive corneal on the right. Weak gag and cough reflexes. On pain stimulation, no movement LUE but trace withdraw on the RUE and 1/5 LLE but 0/5 RLE. DTR diminished and no babinskis. Sensation, coordination and gait not tested.    ASSESSMENT/PLAN [name] is a 47 y.o. male with history of CAD, DM, HTN, cardiomyopathy, obesity, OSA, and a stroke in 12/2016 admitted for CABG on 01/26/2018.  Became lethargic second day, with left-sided weakness and left-sided neglect, CT showed right MCA stroke.  However, developed sepsis with septic shock on 02/03/18 followed by unresponsiveness.  neurology was reconsulted.   No tPA given due to out of window.    Stroke :  Right MCA, b/l corpus callosum, left SCA infarcts, likely due to severe intracranial stenosis in the setting of septic shock  Resultant unresponsiveness, intubated  MRI/MRA - not able to perform due to recent CABG  CTA head and neck right M1 severe stenosis, bilateral cavernous ICA high-grade stenosis, right CCA 50% stenosis  Lovenox for VTE prophylaxis Fall precautions  Diet NPO time specified   aspirin 300 mg suppository daily prior to admission, now on aspirin 325 mg daily and clopidogrel 75 mg daily.   Ongoing aggressive stroke risk factor management  Therapy recommendations:  Pending   Disposition:  Pending  Poor prognosis  Recent stroke  01/23/18 acute left sided weakness, left neglect with lethargy  CT showed left PICA new infarct  CT repeat showed right MCA infarct   CTA head and neck showed right M1 and M2 stenosis  CT perfusion no penumbra  EEG negative for seizure  EF 55-60%  LDL 36 and A1c 7.2  Put on aspirin 300 PR and Lipitor at that time  History of stroke  12/2016 left lateral medullary infarct  CT head and neck showed bilateral VA stenosis, carotid siphons atherosclerosis, 50% bilateral MCA stenosis  EF 55-60%  LDL 168, A1c 9.5  Patient was put on dual antiplatelet and Lipitor  Septic shock  Fever Tmax 102.9  Leukocytosis WBC 20.3->17.7->12.2  Hype prolonged hypotension, requiring pressors  On cefepime and vancomycin  Blood culture NGTD  Intracranial stenosis  CTA head neck x2 confirmed bilateral cavernous ICA high-grade stenosis, right OM1 and M2 severe stenosis, right CCA 50% stenosis  On dual antiplatelet   CAD s/p CABG  Cardiology on board  On bidil  Diabetes  HgbA1c 7.2 goal < 7.0  Uncontrolled  Hyperglycemia  Currently on Levemir  CBG monitoring  SSI  Hyperlipidemia  Home meds: Lipitor  LDL 36, goal < 70  Continue statin at discharge  Other Stroke  Risk Factors  Obesity, Body mass index is 39.92 kg/m.   Obstructive sleep apnea, on CPAP at home  Other Active Problems  Hypernatremia  AKI -  creatinine 1.71->1.60  Postop anemia  Mild leukocytosis - Tmax 102.9 - Vancomycin and Maxipime  Hypotension -> pressors  Left lower lobe collapse/consolidation    PLAN  Continue aspirin and Plavix  Avoid hypotension  The stroke team will sign off at this time. Please call if we can be of further service.  Follow-up [facility] - [facility]  day # 15   Neurology will sign off on patient please re-consult if needed.  This patient is critically ill due to large right MCA stroke, bilateral corpus callosum stroke, unresponsiveness, septic shock, CAD status post CABG and at significant risk of neurological worsening, death form septic shock, hemorrhagic conversion, seizure, heart failure, aspiration pneumonia. This patient's care requires constant monitoring of vital signs, hemodynamics, respiratory and cardiac monitoring, review of multiple databases, neurological assessment, discussion with family, other specialists and medical decision making of high complexity. Prognosis poor. I spent 30 minutes of neurocritical care time in the care of this patient.      To contact Stroke Continuity provider, please refer to [email or web address]. After hours, contact General Neurology

## 2018-02-06 NOTE — Progress Notes (Signed)
Patient ID: Nicholas Mejia, male   DOB: 10-14-1971, 47 y.o.   MRN: [id number] T[facility] DAILY ICU PROGRESS NOTE                   [address]            [place] [address]          [phone]   15 Days Post-Op Procedure(s) (LRB): CORONARY ARTERY BYPASS GRAFTING (CABG) TIMES Four,  USING LEFT INTERNAL MAMMARY ARTERY, RIGHT RADIAL ARTERY AND RIGHT SAPHENOUS LEG VEIN HARVESTED ENDOSCOPICALLY (N/A) RADIAL ARTERY HARVEST (Right) TRANSESOPHAGEAL ECHOCARDIOGRAM (TEE) (N/A)  Total Length of Stay:  LOS: 15 days   Subjective: Remains sedated on vent , unresponsive  Objective: Vital signs in last 24 hours: Temp:  [100.2 F (37.9 C)-102.9 F (39.4 C)] 102.3 F (39.1 C) (03/16 0630) Pulse Rate:  [89-116] 116 (03/16 0630) Cardiac Rhythm: Normal sinus rhythm (03/15 1600) Resp:  [14-22] 19 (03/16 0630) BP: (113-178)/(56-81) 114/76 (03/16 0630) SpO2:  [95 %-100 %] 98 % (03/16 0630) FiO2 (%):  [30 %] 30 % (03/16 0615) Weight:  [311 lb 1.1 oz (141.1 kg)] 311 lb 1.1 oz (141.1 kg) (03/16 0700)  Filed Weights   02/04/18 0600 02/05/18 0600 02/06/18 0700  Weight: (!) 305 lb 16 oz (138.8 kg) (!) 308 lb 10.3 oz (140 kg) (!) 311 lb 1.1 oz (141.1 kg)    Weight change: 2 lb 6.8 oz (1.1 kg)   Hemodynamic parameters for last 24 hours:    Intake/Output from previous day: 03/15 0701 - 03/16 0700 In: 4535.7 [I.V.:1945.7; Blood:30; NG/GT:1720; IV YOVZCHYIF:027] Out: 2895 [Urine:2650; Emesis/NG output:245]  Intake/Output this shift: No intake/output data recorded.  Current Meds: Scheduled Meds: . aspirin  325 mg Per Tube Daily  . bisacodyl  10 mg Oral Daily   Or  . bisacodyl  10 mg Rectal Daily  . chlorhexidine gluconate (MEDLINE KIT)  15 mL Mouth Rinse BID  . Chlorhexidine Gluconate Cloth  6 each Topical Daily  . clopidogrel  75 mg Oral Daily  . docusate  200 mg Per Tube Daily  . enoxaparin (LOVENOX) injection  40 mg Subcutaneous QHS  . feeding supplement (PRO-STAT SUGAR FREE 64)   60 mL Per Tube QID  . fentaNYL (SUBLIMAZE) injection  50 mcg Intravenous Once  . free water  200 mL Per Tube Q6H  . insulin aspart  0-20 Units Subcutaneous Q4H  . insulin detemir  45 Units Subcutaneous BID  . isosorbide-hydrALAZINE  1 tablet Oral TID  . mouth rinse  15 mL Mouth Rinse 10 times per day  . pantoprazole sodium  40 mg Per Tube Daily  . tamsulosin  0.4 mg Oral Daily   Continuous Infusions: . ceFEPime (MAXIPIME) IV 1 g (02/06/18 0500)  . DOPamine 5 mcg/kg/min (02/06/18 0225)  . feeding supplement (JEVITY 1.2 CAL) 1,000 mL (02/05/18 1900)  . fentaNYL infusion INTRAVENOUS    . norepinephrine (LEVOPHED) Adult infusion 14 mcg/min (02/06/18 0045)  . phenylephrine (NEO-SYNEPHRINE) Adult infusion 160 mcg/min (02/06/18 0225)  . vancomycin Stopped (02/06/18 0355)   PRN Meds:.acetaminophen (TYLENOL) oral liquid 160 mg/5 mL, fentaNYL, hydrALAZINE, midazolam, montelukast, ondansetron (ZOFRAN) IV, sodium chloride flush, traMADol  General appearance: Unresponsive Neurologic: Unresponsive even to painful stimuli Heart: regular rate and rhythm, S1, S2 normal, no murmur, click, rub or gallop Lungs: diminished breath sounds bilaterally Abdomen: soft, non-tender; bowel sounds normal; no masses,  no organomegaly Extremities: extremities normal, atraumatic, no cyanosis or edema Wound: Sternum stable  Lab  Results: CBC: Recent Labs    02/05/18 0441 02/06/18 0216  WBC 17.7* 14.0*  HGB 7.7* 6.8*  HCT 27.3* 24.4*  PLT 365 318   BMET:  Recent Labs    02/04/18 0306 02/05/18 0441  NA 151* 156*  K 3.6 3.6  CL 118* 125*  CO2 26 26  GLUCOSE 241* 280*  BUN 65* 55*  CREATININE 1.71* 1.38*  CALCIUM 6.6* 6.6*    CMET: Lab Results  Component Value Date   WBC 14.0 (H) 02/06/2018   HGB 6.8 (LL) 02/06/2018   HCT 24.4 (L) 02/06/2018   PLT 318 02/06/2018   GLUCOSE 280 (H) 02/05/2018   CHOL 83 01/24/2018   TRIG 80 01/24/2018   HDL 31 (L) 01/24/2018   LDLCALC 36 01/24/2018   ALT 73  (H) 02/04/2018   AST 161 (H) 02/04/2018   NA 156 (H) 02/05/2018   K 3.6 02/05/2018   CL 125 (H) 02/05/2018   CREATININE 1.38 (H) 02/05/2018   BUN 55 (H) 02/05/2018   CO2 26 02/05/2018   TSH 0.303 (L) 02/04/2018   INR 1.33 02/07/2018   HGBA1C 7.2 (H) 01/24/2018      PT/INR: No results for input(s): LABPROT, INR in the last 72 hours. Radiology: Ct Chest Wo Contrast  Result Date: 02/05/2018 CLINICAL DATA:  Pleural effusion. 14 days postop from coronary artery bypass graft. Intubated unresponsive. EXAM: CT CHEST WITHOUT CONTRAST TECHNIQUE: Multidetector CT imaging of the chest was performed following the standard protocol without IV contrast. COMPARISON:  None. FINDINGS: Cardiovascular: The heart is enlarged. Trace pericardial fluid. Status post CABG. Atherosclerotic calcification is noted in the wall of the thoracic aorta. Mediastinum/Nodes: No mediastinal lymphadenopathy. No evidence for gross hilar lymphadenopathy although assessment is limited by the lack of intravenous contrast on today's study. The esophagus has normal imaging features. There is no axillary lymphadenopathy. Lungs/Pleura: Endotracheal tube tip positioned in the distal trachea. Low lung volumes with left lower lobe collapse/consolidation and moderate left pleural effusion. More modest collapse/consolidative change in the right lower lobe. No evidence for overt pulmonary edema. Upper Abdomen: Feeding tube is visualized coursing through the gastric lumen although the distal tip has not been included on the study. Musculoskeletal: Bone windows reveal no worrisome lytic or sclerotic osseous lesions. IMPRESSION: 1. Left lower lobe collapse/consolidation with moderate left pleural effusion. 2. Mild atelectasis/infiltrate right lower lobe. Electronically Signed   By: Misty Stanley M.D.   On: 02/05/2018 14:51     Assessment/Plan: S/P Procedure(s) (LRB): CORONARY ARTERY BYPASS GRAFTING (CABG) TIMES Four,  USING LEFT INTERNAL MAMMARY  ARTERY, RIGHT RADIAL ARTERY AND RIGHT SAPHENOUS LEG VEIN HARVESTED ENDOSCOPICALLY (N/A) RADIAL ARTERY HARVEST (Right) TRANSESOPHAGEAL ECHOCARDIOGRAM (TEE) (N/A) Remains on pressors including levo fed neo-and dopamine for blood pressure support Chest x-ray this morning does not appear to have increasing left pleural effusion Continue with supportive care    Grace Isaac 02/06/2018 7:12 AM

## 2018-02-06 NOTE — Progress Notes (Signed)
Core track feeding tube clogged, attempted to unclog, have been flushing frequently throughout the day.

## 2018-02-06 NOTE — Progress Notes (Signed)
PULMONARY / CRITICAL CARE MEDICINE   Name: Nicholas Mejia MRN: [id number] DOB: 10-Mar-1971    ADMISSION DATE:  02/13/2018 CONSULTATION DATE:  3/14  REFERRING MD:  Dr. Dorris FetchHendrickson, Dr. Laurence SlateAroor  CHIEF COMPLAINT:  Obtundation, CVA  HISTORY OF PRESENT ILLNESS:   47 yo male former smoker with 3 v recheck CBC for completeness essel CAD and EF 40%.  Had CABG 3/01.  Altered mental status and Lt sided weakness post op.  Found to have new Lt PICA and Rt subcortical MCA infarcts from intraoperative hypotension with stenosed MCA.  Mental status deteriorated further 3/13 and required intubation for airway protection. PMHx of CVA, CAD, HTN, OSA, DM, GERD  SUBJECTIVE:  No subjective change remains on full mechanical dilatory  VITAL SIGNS: BP (!) 159/83   Pulse (!) 110   Temp (!) 102.9 F (39.4 C) (Axillary)   Resp 18   Ht 6' 2.02" (1.88 m)   Wt (!) 141.1 kg (311 lb 1.1 oz)   SpO2 100%   BMI 39.92 kg/m   VENTILATOR SETTINGS: Vent Mode: PRVC FiO2 (%):  [30 %] 30 % Set Rate:  [14 bmp] 14 bmp Vt Set:  [660 mL] 660 mL PEEP:  [5 cmH20] 5 cmH20 Plateau Pressure:  [18 cmH20-24 cmH20] 24 cmH20  INTAKE / OUTPUT: I/O last 3 completed shifts: In: 7586.4 [I.V.:3164.9; Blood:30; Other:140; NG/GT:2751.5; IV Piggyback:1500] Out: 4560 [Urine:4315; Emesis/NG output:245]  PHYSICAL EXAMINATION:  General: Obese male intubated HEENT: Endotracheal tube to ventilator PSY: Nonresponsive Neuro: Does not follow commands disconjugate gaze, no gag reflex positive  CV: Heart sounds are regular with a murmur PULM: even/non-labored, lungs bilaterally decreased breath sounds in bases WU:JWJXGI:soft, non-tender, bsx4 active  Extremities: warm/dry, 1+ edema  Skin: no rashes or lesions    LABS:  BMET Recent Labs  Lab 02/03/18 0419 02/04/18 0306 02/05/18 0441  NA 156* 151* 156*  K 4.6 3.6 3.6  CL 116* 118* 125*  CO2 31 26 26   BUN 52* 65* 55*  CREATININE 1.48* 1.71* 1.38*  GLUCOSE 166* 241* 280*     Electrolytes Recent Labs  Lab 02/03/18 0419 02/04/18 0306 02/05/18 0441  CALCIUM 8.3* 6.6* 6.6*  MG  --  2.7*  --   PHOS  --  4.0  --     CBC Recent Labs  Lab 02/04/18 0306 02/05/18 0441 02/06/18 0216  WBC 20.3* 17.7* 14.0*  HGB 7.5* 7.7* 6.8*  HCT 26.4* 27.3* 24.4*  PLT 348 365 318    Coag's No results for input(s): APTT, INR in the last 168 hours.  Sepsis Markers No results for input(s): LATICACIDVEN, PROCALCITON, O2SATVEN in the last 168 hours.  ABG Recent Labs  Lab 02/03/18 2017 02/04/18 0152 02/05/18 0335  PHART 7.402 7.375 7.427  PCO2ART 51.5* 56.7* 46.4  PO2ART 92.0 118.0* 91.8    Liver Enzymes Recent Labs  Lab 02/04/18 0306  AST 161*  ALT 73*  ALKPHOS 49  BILITOT 1.4*  ALBUMIN 2.1*    Cardiac Enzymes No results for input(s): TROPONINI, PROBNP in the last 168 hours.  Glucose Recent Labs  Lab 02/05/18 1136 02/05/18 1630 02/05/18 1925 02/05/18 2348 02/06/18 0359 02/06/18 0727  GLUCAP 241* 192* 217* 246* 282* 282*    Imaging Ct Chest Wo Contrast  Result Date: 02/05/2018 CLINICAL DATA:  Pleural effusion. 14 days postop from coronary artery bypass graft. Intubated unresponsive. EXAM: CT CHEST WITHOUT CONTRAST TECHNIQUE: Multidetector CT imaging of the chest was performed following the standard protocol without IV contrast. COMPARISON:  None. FINDINGS: Cardiovascular:  The heart is enlarged. Trace pericardial fluid. Status post CABG. Atherosclerotic calcification is noted in the wall of the thoracic aorta. Mediastinum/Nodes: No mediastinal lymphadenopathy. No evidence for gross hilar lymphadenopathy although assessment is limited by the lack of intravenous contrast on today's study. The esophagus has normal imaging features. There is no axillary lymphadenopathy. Lungs/Pleura: Endotracheal tube tip positioned in the distal trachea. Low lung volumes with left lower lobe collapse/consolidation and moderate left pleural effusion. More modest  collapse/consolidative change in the right lower lobe. No evidence for overt pulmonary edema. Upper Abdomen: Feeding tube is visualized coursing through the gastric lumen although the distal tip has not been included on the study. Musculoskeletal: Bone windows reveal no worrisome lytic or sclerotic osseous lesions. IMPRESSION: 1. Left lower lobe collapse/consolidation with moderate left pleural effusion. 2. Mild atelectasis/infiltrate right lower lobe. Electronically Signed   By: Kennith Center M.D.   On: 02/05/2018 14:51   Dg Chest Port 1 View  Result Date: 02/06/2018 CLINICAL DATA:  Status post CABG. EXAM: PORTABLE CHEST 1 VIEW COMPARISON:  02/05/2018. FINDINGS: Cardiomegaly. Recent CABG. Patient remains intubated with ET tube and orogastric tube. PICC line tip from LEFT arm approach lies the cavoatrial junction. There is slight improvement in aeration in the LEFT hemithorax. Decreasing edema, in consolidation, with persistent LEFT lower lobe opacity suggesting atelectasis and effusion. IMPRESSION: Improved aeration.  Persistent retrocardiac density. Electronically Signed   By: Elsie Stain M.D.   On: 02/06/2018 07:19     STUDIES:  CT head 3/2 > Negative CTA for large vessel occlusion, Small volume acute ischemic left PICA territory infarct. 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 While subocclusive thrombus would be difficult to exclude, this is felt to be less likely. Query an episode of recent hypotension. Advanced atherosclerotic change affecting the cavernous ICAs bilaterally, left greater than right, slightly progressed from previous. Short-segment 50% proximal left M1 stenosis. CT head 3/4 > new patchy hypodensities in the right corona radiata and right caudate nucleus consistent with acute infarcts. CT head 3/13 > Increasing or new regions of hypoattenuation scattered throughout right MCA distribution white matter, involving body and  splenium of corpus callosum, and in the left lateral cerebellar hemisphere adjacent to prior PICA infarction likely representing interval areas of acute infarction. CTA head 3/14 >No emergent large vessel occlusion.  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  Approximately 50% stenosis of the distal right common carotid artery, unchanged. EEG 3/14 > diffuse slowing, focal slowing in Rt hemisphere compared to Lt  CULTURES: Blood 3/13 > Aerococcus Urine 3/13 > Tracheal aspirate 3/13 >  ANTIBIOTICS: Cefepime 3/13 > Vancomycin 3/13 >  SIGNIFICANT EVENTS: 3/1 admit for CABG 3/2 lethargy, new stroke identified 3/13 worsening lethargy after about a weak of slow progression.  3/14 intubated for airway protection.   LINES/TUBES: PICC 3/2 > Cortrak 3/12 > ETT 3/14 >  DISCUSSION: Stroke x2 following surgery.  Currently unresponsive no subjective change.  ASSESSMENT / PLAN:  Acute on chronic hypoxic/hypercapnic respiratory failure. OSA. Full vent support Mental status makes extubation unlikely  Acute metabolic encephalopathy. CVA. Currently on aspirin and Plavix Neurology following  CAD s/p CABG. Acute on chronic systolic CHF. Hx of HTN. -Per cardiothoracic surgeon  Possible HCAP. -Antimicrobial therapy -Monitor culture data note 1 of 2 blood cultures with enterococcus species  Hypernatremia. Recent Labs  Lab 02/03/18 0419 02/04/18 0306  02/05/18 0441  NA 156* 151* 156*   Lab Results  Component Value Date   CREATININE 1.38 (H) 02/05/2018   CREATININE 1.71 (H) 02/04/2018   CREATININE 1.48 (H) 02/03/2018     CKD 2. Monitor creatinine avoid nephrotoxins Currently on free water 200 every 6 will change frequency to every 4 hours  Anemia of critical illness. Recent Labs    02/05/18 0441 02/06/18 0216  HGB 7.7* 6.8*    Follow CBC Transfuse for less than 7 Noted to  be on Plavix and Lovenox 02/06/2018 will recheck CBC for completeness  DM type II. CBG (last 3)  Recent Labs    02/05/18 2348 02/06/18 0359 02/06/18 0727  GLUCAP 246* 282* 282*    Sliding scale coverage with Levemir increased to 50 units every 12 hours  DVT prophylaxis - lovenox SUP - protonix Nutrition - tube feeds Goals of care - full code  NP critical care time 30 minutes   Brett Canales Lowell Mcgurk ACNP [facility] Pager [phone] till 1 pm If no answer page [phone] 02/06/2018, 8:31 AM

## 2018-02-06 NOTE — Progress Notes (Signed)
Pharmacy Antibiotic Note  Nicholas Mejia is a 47 y.o. male admitted on 01/23/2018 with sepsis.  Pharmacy has been consulted for Vancomycin dosing. WBC trending down. Vancomycin trough is therapeutic at 17 this AM.   Plan: -Cont vancomycin 1250 mg IV q12h -Re-check trough in 2-3 days or with any significant changes in renal function/clinical status  Height: 6' 2.02" (188 cm) Weight: (!) 308 lb 10.3 oz (140 kg) IBW/kg (Calculated) : 82.24  Temp (24hrs), Avg:101.5 F (38.6 C), Min:100.2 F (37.9 C), Max:102.9 F (39.4 C)  Recent Labs  Lab 01/30/18 0526 01/30/18 0921 02/03/18 0419 02/04/18 0306 02/05/18 0441 02/06/18 0216  WBC  --   --  15.7* 20.3* 17.7* 14.0*  CREATININE 1.11 1.16 1.48* 1.71* 1.38*  --   VANCOTROUGH  --   --   --   --   --  17    Estimated Creatinine Clearance: 97.5 mL/min (A) (by C-G formula based on SCr of 1.38 mg/dL (H)).    Allergies  Allergen Reactions  . Bee Venom Anaphylaxis and Swelling  . Metoprolol Shortness Of Breath  . Penicillins Swelling    Swelling in feet Has patient had a PCN reaction causing immediate rash, facial/tongue/throat swelling, SOB or lightheadedness with hypotension: No Has patient had a PCN reaction causing severe rash involving mucus membranes or skin necrosis: No Has patient had a PCN reaction that required hospitalization: No Has patient had a PCN reaction occurring within the last 10 years: No If all of the above answers are "NO", then may proceed with Cephalosporin use.  . Pollen Extract     UNSPECIFIED REACTION      Nicholas Mejia, Nicholas Mejia 02/06/2018 4:29 AM

## 2018-02-06 NOTE — Progress Notes (Signed)
eLink Physician-Brief Progress Note Patient Name: Nicholas SparWarren Weller DOB: 04/22/1971 MRN: [id number]   Date of Service  02/06/2018  HPI/Events of Note  Hgb drop from 7.7 to 6.8  eICU Interventions  Transfuse 1 unit pRBC Post-transfusion CBC     Intervention Category Major Interventions: Other:  DETERDING,ELIZABETH 02/06/2018, 3:43 AM

## 2018-02-07 ENCOUNTER — Inpatient Hospital Stay (HOSPITAL_COMMUNITY): Payer: BLUE CROSS/BLUE SHIELD

## 2018-02-07 LAB — BASIC METABOLIC PANEL
Anion gap: 6 (ref 5–15)
BUN: 45 mg/dL — ABNORMAL HIGH (ref 6–20)
CO2: 27 mmol/L (ref 22–32)
Calcium: 7.7 mg/dL — ABNORMAL LOW (ref 8.9–10.3)
Chloride: 128 mmol/L — ABNORMAL HIGH (ref 101–111)
Creatinine, Ser: 1.34 mg/dL — ABNORMAL HIGH (ref 0.61–1.24)
GFR calc Af Amer: >60 mL/min (ref >60)
GFR calc non Af Amer: >60 mL/min (ref >60)
Glucose, Bld: 275 mg/dL — ABNORMAL HIGH (ref 65–99)
Potassium: 4 mmol/L (ref 3.5–5.1)
Sodium: 161 mmol/L (ref 135–145)

## 2018-02-07 LAB — GLUCOSE, CAPILLARY
GLUCOSE-CAPILLARY: 183 mg/dL — AB (ref 65–99)
GLUCOSE-CAPILLARY: 213 mg/dL — AB (ref 65–99)
GLUCOSE-CAPILLARY: 229 mg/dL — AB (ref 65–99)
Glucose-Capillary: 190 mg/dL — ABNORMAL HIGH (ref 65–99)
Glucose-Capillary: 195 mg/dL — ABNORMAL HIGH (ref 65–99)
Glucose-Capillary: 238 mg/dL — ABNORMAL HIGH (ref 65–99)
Glucose-Capillary: 259 mg/dL — ABNORMAL HIGH (ref 65–99)

## 2018-02-07 LAB — CBC
HCT: 31.1 % — ABNORMAL LOW (ref 39.0–52.0)
Hemoglobin: 8.8 g/dL — ABNORMAL LOW (ref 13.0–17.0)
MCH: 28.6 pg (ref 26.0–34.0)
MCHC: 28.3 g/dL — ABNORMAL LOW (ref 30.0–36.0)
MCV: 101 fL — ABNORMAL HIGH (ref 78.0–100.0)
Platelets: 336 10*3/uL (ref 150–400)
RBC: 3.08 MIL/uL — ABNORMAL LOW (ref 4.22–5.81)
RDW: 15 % (ref 11.5–15.5)
WBC: 14.6 10*3/uL — ABNORMAL HIGH (ref 4.0–10.5)

## 2018-02-07 LAB — PHOSPHORUS: PHOSPHORUS: 3.8 mg/dL (ref 2.5–4.6)

## 2018-02-07 LAB — MAGNESIUM: MAGNESIUM: 3 mg/dL — AB (ref 1.7–2.4)

## 2018-02-07 MED ORDER — INSULIN DETEMIR 100 UNIT/ML ~~LOC~~ SOLN
65.0000 [IU] | Freq: Two times a day (BID) | SUBCUTANEOUS | Status: DC
  Administered 2018-02-07 – 2018-02-08 (×4): 65 [IU] via SUBCUTANEOUS
  Filled 2018-02-07 (×5): qty 0.65

## 2018-02-07 NOTE — Progress Notes (Signed)
Cooling blanket restarted set a 37 degrees for temp 100.4

## 2018-02-07 NOTE — Plan of Care (Signed)
Patient remains obtunded/ unresponsive - extension decerebrating to stimuli. Today opened eyes to voice/pain, but not focusing, or moving eyes blinking when hand waving in front of eyes. Brother and mother at beside updated. Sodium elevated, free water continues, better control of blood sugars today with increase in levemir.oxygenation good, maintenance of airway doubtful without vent support. NO plan for weaning currently.Coughs to stimulation, no gag. Urinating via foley. Heart rate stable, continues to require pressors, albeit lower than yesterday. To maintain MAP.> 65.  Continue to observe neuro status and maintain body function support.MRI -EEG needed and ordered pending more significant hemodynamic stability, questionable if would change the outcome/treatment. More discussion required about quality, trach PEG, LTACH vs SNF or palliative care.

## 2018-02-07 NOTE — Progress Notes (Signed)
PULMONARY / CRITICAL CARE MEDICINE   Name: Nicholas Mejia MRN: [id number] DOB: 07/08/1971    ADMISSION DATE:  02/17/2018 CONSULTATION DATE: March 14   REFERRING MD:  Dr. Dorris FetchHendrickson  CHIEF COMPLAINT: Confusion after coronary artery bypass grafting  HISTORY OF PRESENT ILLNESS:   47 year old male with a past medical history significant for coronary artery disease who has an LVEF of 40% underwent a coronary artery bypass graft on January 22, 2018.  Afterwards was noted to have altered mental status with a new right sided ischemic stroke.  Ultimately required intubation on March 13.    SUBJECTIVE:  Mental status unchanged No fever today  VITAL SIGNS: BP (!) 155/68 (BP Location: Right Arm)   Pulse 83   Temp 99.4 F (37.4 C) (Oral)   Resp 17   Ht 6' 2.02" (1.88 m)   Wt (!) 332 lb 14.3 oz (151 kg)   SpO2 98%   BMI 42.72 kg/m   HEMODYNAMICS:    VENTILATOR SETTINGS: Vent Mode: PRVC FiO2 (%):  [30 %] 30 % Set Rate:  [14 bmp] 14 bmp Vt Set:  [660 mL] 660 mL PEEP:  [5 cmH20] 5 cmH20 Plateau Pressure:  [20 cmH20-23 cmH20] 20 cmH20  INTAKE / OUTPUT: I/O last 3 completed shifts: In: 8307.4 [I.V.:3627.4; Blood:630; Other:60; NG/GT:2740; IV Piggyback:1250] Out: 3650 [Urine:2905; Emesis/NG output:745]  PHYSICAL EXAMINATION:  General:  In bed on vent HENT: NCAT ETT in place PULM: CTA B, vent supported breathing CV: RRR, no mgr GI: BS+, soft, nontender MSK: normal bulk and tone Neuro: minimal response to my exam    LABS:  BMET Recent Labs  Lab 02/05/18 0441 02/06/18 1231 02/07/18 0441  NA 156* 160* 161*  K 3.6 4.1 4.0  CL 125* 126* 128*  CO2 26 26 27   BUN 55* 54* 45*  CREATININE 1.38* 1.60* 1.34*  GLUCOSE 280* 322* 275*    Electrolytes Recent Labs  Lab 02/04/18 0306 02/05/18 0441 02/06/18 1231 02/07/18 0441  CALCIUM 6.6* 6.6* 7.5* 7.7*  MG 2.7*  --   --  3.0*  PHOS 4.0  --   --  3.8    CBC Recent Labs  Lab 02/06/18 0216 02/06/18 1231 02/07/18 0441   WBC 14.0* 12.2* 14.6*  HGB 6.8* 8.9* 8.8*  HCT 24.4* 31.1* 31.1*  PLT 318 329 336    Coag's No results for input(s): APTT, INR in the last 168 hours.  Sepsis Markers No results for input(s): LATICACIDVEN, PROCALCITON, O2SATVEN in the last 168 hours.  ABG Recent Labs  Lab 02/03/18 2017 02/04/18 0152 02/05/18 0335  PHART 7.402 7.375 7.427  PCO2ART 51.5* 56.7* 46.4  PO2ART 92.0 118.0* 91.8    Liver Enzymes Recent Labs  Lab 02/04/18 0306  AST 161*  ALT 73*  ALKPHOS 49  BILITOT 1.4*  ALBUMIN 2.1*    Cardiac Enzymes No results for input(s): TROPONINI, PROBNP in the last 168 hours.  Glucose Recent Labs  Lab 02/06/18 1151 02/06/18 1618 02/06/18 1930 02/06/18 2330 02/07/18 0315 02/07/18 0725  GLUCAP 277* 248* 222* 195* 213* 238*    Imaging Dg Chest Port 1 View  Result Date: 02/07/2018 CLINICAL DATA:  Chest tube in place. EXAM: PORTABLE CHEST 1 VIEW COMPARISON:  Chest radiograph 02/06/2018. FINDINGS: ET tube terminates at the thoracic inlet, consider advancement. Enteric tube courses inferior to the diaphragm. Left upper extremity PICC line tip projects over the superior vena cava. Stable cardiomegaly status post median sternotomy. Persistent moderate left pleural effusion with underlying opacities. No pneumothorax. IMPRESSION: ET  tube terminates at the thoracic inlet, consider advancement. Persistent small left pleural effusion and underlying opacities which may represent atelectasis or infection. These results will be called to the ordering clinician or representative by the Radiologist Assistant, and communication documented in the PACS or zVision Dashboard. Electronically Signed   By: Annia Belt M.D.   On: 02/07/2018 07:53   Dg Abd Portable 1v  Result Date: 02/06/2018 CLINICAL DATA:  Nasogastric tube placement EXAM: PORTABLE ABDOMEN - 1 VIEW COMPARISON:  January 29, 2018 FINDINGS: Nasogastric tube tip is in the proximal stomach with the side port at the  gastroesophageal junction. There is moderate stool in the visualized colon. There is no appreciable bowel dilatation or air-fluid level to suggest bowel obstruction. No evident free air. Temporary pacemaker leads are attached to the right heart. IMPRESSION: Nasogastric tube tip in proximal stomach with side port at gastroesophageal junction. Advise advancing nasogastric tube approximately 6 cm to insure that both tube tip and side port are well within the stomach. No bowel obstruction or free air evident. Moderate stool in the colon. Electronically Signed   By: Bretta Bang III M.D.   On: 02/06/2018 23:47     STUDIES:  CT head 3/2 > Negative CTA for large vessel occlusion, Small volume acute ischemic left PICA territory infarct. 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 While subocclusive thrombus would be difficult to exclude, this is felt to be less likely. Query an episode of recent hypotension. Advanced atherosclerotic change affecting the cavernous ICAs bilaterally, left greater than right, slightly progressed from previous. Short-segment 50% proximal left M1 stenosis. CT head 3/4 > new patchy hypodensities in the right corona radiata and right caudate nucleus consistent with acute infarcts. CT head 3/13 > Increasing or new regions of hypoattenuation scattered throughout right MCA distribution white matter, involving body and splenium of corpus callosum, and in the left lateral cerebellar hemisphere adjacent to prior PICA infarction likely representing interval areas of acute infarction. CTA head 3/14 >No emergent large vessel occlusion.  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  Approximately 50% stenosis of the distal right common carotid artery, unchanged. EEG 3/14 > diffuse slowing, focal slowing in Rt hemisphere compared to  Lt  CULTURES: Blood 3/13 > Aerococcus Urine 3/13 > Tracheal aspirate 3/13 >  ANTIBIOTICS: Cefepime 3/13 > Vancomycin 3/13 >  SIGNIFICANT EVENTS: 3/1 admit for CABG 3/2 lethargy, new stroke identified 3/13 worsening lethargy after about a weak of slow progression.  3/14 intubated for airway protection.   LINES/TUBES: PICC 3/2 > Cortrak 3/12 > ETT 3/14 >    DISCUSSION: 47 year old male who experienced an ischemic stroke after coronary artery bypass grafting.  Now with altered mental status making it unable for him to protect his airway.  ASSESSMENT / PLAN:  PULMONARY A: Acute respiratory failure with hypoxemia Obstructive sleep apnea P:   Continue full mechanical ventilatory support until he is able to protect his airway Ventilator associated pneumonia prevention protocol Daily SBT  CARDIOVASCULAR A:  CAD, s/p CABG Septic shock P:  Tele Continue neo, levophed, dopamine per TCTS  RENAL A:   Hypernatremia P:   Maintain D5 for now, if OK by renal will push this higher Maintain free water via tube today .dbmrne   GASTROINTESTINAL A:   No acute issue P:   Continue tube feeding  HEMATOLOGIC A:   Mild anemia  no bleeding P:  Monitor for bleeding  INFECTIOUS A:   Fever [facility] associated pneumonia Likely left maxillary sinusitis  P:   Continue saline sprays Continue Vanco and Cefepime Follow-up cultures  ENDOCRINE A:   Hyperglycemia, worse on D5 infusion  P:   Increase Levemir  NEUROLOGIC A:   Ischemic stroke, posterior MCA distribution on right Encephalopathy, acute due to stroke and toxic metabolic P:   RASS goal: 0 PAD protocol, minimize sedation Daily wakeup assessment Follow-up neurology recommendations, question for today is what to do about the hyponatremia   FAMILY  - Updates: updated by me on 3/16  - Inter-disciplinary family meet or Palliative Care meeting due by:  day 7  My cc time 35 minutes  Heber [place]rapahoe,  MD [facility] Pager: [phone] Cell: [phone] After 3pm or if no response, call [phone]   02/07/2018, 8:55 AM

## 2018-02-07 NOTE — Progress Notes (Signed)
Patient ID: Eugen Jeansonne, male   DOB: 17-Jul-1971, 47 y.o.   MRN: [id number] TCTS DAILY ICU PROGRESS NOTE                   [address]            [place] [address]          [phone]   16 Days Post-Op Procedure(s) (LRB): CORONARY ARTERY BYPASS GRAFTING (CABG) TIMES Four,  USING LEFT INTERNAL MAMMARY ARTERY, RIGHT RADIAL ARTERY AND RIGHT SAPHENOUS LEG VEIN HARVESTED ENDOSCOPICALLY (N/A) RADIAL ARTERY HARVEST (Right) TRANSESOPHAGEAL ECHOCARDIOGRAM (TEE) (N/A)  Total Length of Stay:  LOS: 16 days   Subjective: Patient's mental status and neuro function is unchanged from yesterday, temperature has come down  Objective: Vital signs in last 24 hours: Temp:  [98.8 F (37.1 C)-102.9 F (39.4 C)] 98.9 F (37.2 C) (03/17 0300) Pulse Rate:  [83-111] 84 (03/17 0700) Cardiac Rhythm: Normal sinus rhythm (03/17 0400) Resp:  [14-27] 19 (03/17 0700) BP: (107-213)/(47-119) 141/71 (03/17 0700) SpO2:  [97 %-99 %] 97 % (03/17 0700) FiO2 (%):  [30 %] 30 % (03/17 0448) Weight:  [332 lb 14.3 oz (151 kg)] 332 lb 14.3 oz (151 kg) (03/17 0300)  Filed Weights   02/05/18 0600 02/06/18 0700 02/07/18 0300  Weight: (!) 308 lb 10.3 oz (140 kg) (!) 311 lb 1.1 oz (141.1 kg) (!) 332 lb 14.3 oz (151 kg)    Weight change: 21 lb 13.2 oz (9.9 kg)   Hemodynamic parameters for last 24 hours:    Intake/Output from previous day: 03/16 0701 - 03/17 0700 In: 5936.4 [I.V.:2656.4; Blood:600; NG/GT:1780; IV Piggyback:900] Out: 2330 [Urine:1830; Emesis/NG output:500]  Intake/Output this shift: No intake/output data recorded.  Current Meds: Scheduled Meds: . aspirin  325 mg Per Tube Daily  . bisacodyl  10 mg Oral Daily   Or  . bisacodyl  10 mg Rectal Daily  . chlorhexidine gluconate (MEDLINE KIT)  15 mL Mouth Rinse BID  . Chlorhexidine Gluconate Cloth  6 each Topical Daily  . clopidogrel  75 mg Oral Daily  . docusate  200 mg Per Tube Daily  . enoxaparin (LOVENOX) injection  40 mg  Subcutaneous QHS  . feeding supplement (PRO-STAT SUGAR FREE 64)  60 mL Per Tube QID  . fentaNYL (SUBLIMAZE) injection  50 mcg Intravenous Once  . free water  200 mL Per Tube Q4H  . insulin aspart  0-20 Units Subcutaneous Q4H  . insulin detemir  50 Units Subcutaneous BID  . isosorbide-hydrALAZINE  1 tablet Oral TID  . mouth rinse  15 mL Mouth Rinse 10 times per day  . pantoprazole sodium  40 mg Per Tube Daily  . tamsulosin  0.4 mg Oral Daily   Continuous Infusions: . ceFEPime (MAXIPIME) IV Stopped (02/07/18 5456)  . dextrose 50 mL/hr at 02/07/18 0438  . DOPamine 3 mcg/kg/min (02/07/18 0630)  . feeding supplement (JEVITY 1.2 CAL) 1,000 mL (02/07/18 0000)  . fentaNYL infusion INTRAVENOUS    . norepinephrine (LEVOPHED) Adult infusion 9 mcg/min (02/07/18 0630)  . phenylephrine (NEO-SYNEPHRINE) Adult infusion 100 mcg/min (02/07/18 0630)  . vancomycin Stopped (02/07/18 0332)   PRN Meds:.acetaminophen (TYLENOL) oral liquid 160 mg/5 mL, fentaNYL, hydrALAZINE, midazolam, montelukast, ondansetron (ZOFRAN) IV, sodium chloride, sodium chloride flush, traMADol  General appearance: Unresponsive Neurologic: intact Heart: regular rate and rhythm, S1, S2 normal, no murmur, click, rub or gallop Lungs: diminished breath sounds bilaterally Abdomen: soft, non-tender; bowel sounds normal; no masses,  no organomegaly  Extremities: extremities normal, atraumatic, no cyanosis or edema and Homans sign is negative, no sign of DVT Wound: intact  Lab Results: CBC: Recent Labs    02/06/18 1231 02/07/18 0441  WBC 12.2* 14.6*  HGB 8.9* 8.8*  HCT 31.1* 31.1*  PLT 329 336   BMET:  Recent Labs    02/06/18 1231 02/07/18 0441  NA 160* 161*  K 4.1 4.0  CL 126* 128*  CO2 26 27  GLUCOSE 322* 275*  BUN 54* 45*  CREATININE 1.60* 1.34*  CALCIUM 7.5* 7.7*    CMET: Lab Results  Component Value Date   WBC 14.6 (H) 02/07/2018   HGB 8.8 (L) 02/07/2018   HCT 31.1 (L) 02/07/2018   PLT 336 02/07/2018    GLUCOSE 275 (H) 02/07/2018   CHOL 83 01/24/2018   TRIG 80 01/24/2018   HDL 31 (L) 01/24/2018   LDLCALC 36 01/24/2018   ALT 73 (H) 02/04/2018   AST 161 (H) 02/04/2018   NA 161 (HH) 02/07/2018   K 4.0 02/07/2018   CL 128 (H) 02/07/2018   CREATININE 1.34 (H) 02/07/2018   BUN 45 (H) 02/07/2018   CO2 27 02/07/2018   TSH 0.303 (L) 02/04/2018   INR 1.33 02/04/2018   HGBA1C 7.2 (H) 01/24/2018      PT/INR: No results for input(s): LABPROT, INR in the last 72 hours. Radiology: Dg Chest Port 1 View  Result Date: 02/07/2018 CLINICAL DATA:  Chest tube in place. EXAM: PORTABLE CHEST 1 VIEW COMPARISON:  Chest radiograph 02/06/2018. FINDINGS: ET tube terminates at the thoracic inlet, consider advancement. Enteric tube courses inferior to the diaphragm. Left upper extremity PICC line tip projects over the superior vena cava. Stable cardiomegaly status post median sternotomy. Persistent moderate left pleural effusion with underlying opacities. No pneumothorax. IMPRESSION: ET tube terminates at the thoracic inlet, consider advancement. Persistent small left pleural effusion and underlying opacities which may represent atelectasis or infection. These results will be called to the ordering clinician or representative by the Radiologist Assistant, and communication documented in the PACS or zVision Dashboard. Electronically Signed   By: Lovey Newcomer M.D.   On: 02/07/2018 07:53   I have independently reviewed the above radiology studies  and reviewed . Et tube advanced slightly   Dg Abd Portable 1v  Result Date: 02/06/2018 CLINICAL DATA:  Nasogastric tube placement EXAM: PORTABLE ABDOMEN - 1 VIEW COMPARISON:  January 29, 2018 FINDINGS: Nasogastric tube tip is in the proximal stomach with the side port at the gastroesophageal junction. There is moderate stool in the visualized colon. There is no appreciable bowel dilatation or air-fluid level to suggest bowel obstruction. No evident free air. Temporary pacemaker  leads are attached to the right heart. IMPRESSION: Nasogastric tube tip in proximal stomach with side port at gastroesophageal junction. Advise advancing nasogastric tube approximately 6 cm to insure that both tube tip and side port are well within the stomach. No bowel obstruction or free air evident. Moderate stool in the colon. Electronically Signed   By: Lowella Grip III M.D.   On: 02/06/2018 23:47     Assessment/Plan: S/P Procedure(s) (LRB): CORONARY ARTERY BYPASS GRAFTING (CABG) TIMES Four,  USING LEFT INTERNAL MAMMARY ARTERY, RIGHT RADIAL ARTERY AND RIGHT SAPHENOUS LEG VEIN HARVESTED ENDOSCOPICALLY (N/A) RADIAL ARTERY HARVEST (Right) TRANSESOPHAGEAL ECHOCARDIOGRAM (TEE) (N/A) Mobilize Diabetes control Continue supportive care, vent and iv antibiotics     Grace Isaac 02/07/2018 8:16 AM

## 2018-02-07 NOTE — Progress Notes (Signed)
RT called to advance ETT per , Dr Tyrone SageGerhardt

## 2018-02-07 NOTE — Progress Notes (Addendum)
S: Called to see patient in follow up.   O: BP 113/60   Pulse 99   Temp (!) 100.4 F (38 C) (Axillary)   Resp (!) 25   Ht 6' 2.02" (1.88 m)   Wt (!) 151 kg (332 lb 14.3 oz)   SpO2 96%   BMI 42.72 kg/m   Ment: Off sedation, the patient is comatose.  CN: Rightward eye deviation with occasional fast beat of nystagmus to left. No blink to threat. Eyelids exhibit reflexive blinking to stimulation. Face flaccidly symmetric.  Motor: Flaccid tone x 4. With noxious stimulation, there is decerebrate posturing of LUE and trace movement of RUE.   A/R 1. Status post right MCA ischemic infarction. Called about gradually increasing sodium now at 161 despite IV D5W. Regarding possible correction to a lower Na value, would not correct Na by more than 10 meq/L per day or by more than 0.5 meq/L per hour to prevent development of osmotic demyelination.   2. Regarding BP, would treat as you normally due for sepsis. No neurological contraindication to keeping MAP above 60-65 and patient likely would have improved cerebral perfusion with a higher MAP. MAP of 80's to 90's currently which is a better range for cerebral perfusion maintenance in the setting of a stenosis. Regarding brain function likely would tolerate higher BPs as well, but given cardiac condition, may not be able to increase BP further. On neosynephrine gtt and levophed gtt.   3. Decerebrate posturing. Would recommend repeat CT head. Last CT on 3/14 revealed stenotic segments and right MCA infarction, but no mass effect.     Electronically signed: Dr. Caryl PinaEric Jamesetta Greenhalgh

## 2018-02-07 NOTE — Progress Notes (Signed)
Patient transported toe CT and back to 2H02 on ventilator with no complications.

## 2018-02-07 NOTE — Progress Notes (Signed)
Brother and son into see, stimulating patient, patient opened right eye halfway and then did it again when brother spoke to him,  Passive ROm done.  Levophed off.

## 2018-02-07 NOTE — Progress Notes (Signed)
Patient ID: Nicholas Mejia, male   DOB: 1971/08/29, 47 y.o.   MRN: [id number] EVENING ROUNDS NOTE :     [address]       [place] [id number]             [phone]                 16 Days Post-Op Procedure(s) (LRB): CORONARY ARTERY BYPASS GRAFTING (CABG) TIMES Four,  USING LEFT INTERNAL MAMMARY ARTERY, RIGHT RADIAL ARTERY AND RIGHT SAPHENOUS LEG VEIN HARVESTED ENDOSCOPICALLY (N/A) RADIAL ARTERY HARVEST (Right) TRANSESOPHAGEAL ECHOCARDIOGRAM (TEE) (N/A)  Total Length of Stay:  LOS: 16 days  BP 113/60   Pulse 99   Temp (!) 100.4 F (38 C) (Axillary)   Resp (!) 25   Ht 6' 2.02" (1.88 m)   Wt (!) 332 lb 14.3 oz (151 kg)   SpO2 96%   BMI 42.72 kg/m   .Intake/Output      03/16 0701 - 03/17 0700 03/17 0701 - 03/18 0700   I.V. (mL/kg) 2656.4 (17.6) 536 (3.5)   Blood 600    Other     NG/GT 1780 560   IV Piggyback 900    Total Intake(mL/kg) 5936.4 (39.3) 1096 (7.3)   Urine (mL/kg/hr) 1830 (0.5) 430 (0.2)   Emesis/NG output 500    Total Output 2330 430   Net +3606.4 +666          . ceFEPime (MAXIPIME) IV Stopped (02/07/18 1427)  . dextrose 50 mL/hr at 02/07/18 1200  . DOPamine 3 mcg/kg/min (02/07/18 1200)  . feeding supplement (JEVITY 1.2 CAL) 1,000 mL (02/07/18 1200)  . fentaNYL infusion INTRAVENOUS    . norepinephrine (LEVOPHED) Adult infusion 6 mcg/min (02/07/18 1554)  . phenylephrine (NEO-SYNEPHRINE) Adult infusion 100 mcg/min (02/07/18 1200)  . vancomycin Stopped (02/07/18 1656)     Lab Results  Component Value Date   WBC 14.6 (H) 02/07/2018   HGB 8.8 (L) 02/07/2018   HCT 31.1 (L) 02/07/2018   PLT 336 02/07/2018   GLUCOSE 275 (H) 02/07/2018   CHOL 83 01/24/2018   TRIG 80 01/24/2018   HDL 31 (L) 01/24/2018   LDLCALC 36 01/24/2018   ALT 73 (H) 02/04/2018   AST 161 (H) 02/04/2018   NA 161 (HH) 02/07/2018   K 4.0 02/07/2018   CL 128 (H) 02/07/2018   CREATININE 1.34 (H) 02/07/2018   BUN 45 (H) 02/07/2018   CO2 27 02/07/2018   TSH 0.303 (L)  02/04/2018   INR 1.33 02/18/2018   HGBA1C 7.2 (H) 01/24/2018   remains unresponsive, levaphed decreased some  Na 161 - getting free water 200 ml q 4 hours and d5w at 50 ml hour    Delight OvensEdward B Malika Demario MD  [place] [phone] Office [phone] 02/07/2018 6:45 PM

## 2018-02-07 NOTE — Progress Notes (Signed)
Spoke to Neuro ( repaged) to discuss their recommendations about sodium level, and treatment, as well as parameters for BP., recommendations in note. Keep MAPS high 70's to 90 for best CPP

## 2018-02-08 ENCOUNTER — Inpatient Hospital Stay (HOSPITAL_COMMUNITY): Payer: BLUE CROSS/BLUE SHIELD

## 2018-02-08 DIAGNOSIS — E870 Hyperosmolality and hypernatremia: Secondary | ICD-10-CM

## 2018-02-08 DIAGNOSIS — R579 Shock, unspecified: Secondary | ICD-10-CM

## 2018-02-08 DIAGNOSIS — J9601 Acute respiratory failure with hypoxia: Secondary | ICD-10-CM

## 2018-02-08 LAB — BASIC METABOLIC PANEL
Anion gap: 7 (ref 5–15)
BUN: 43 mg/dL — ABNORMAL HIGH (ref 6–20)
CHLORIDE: 121 mmol/L — AB (ref 101–111)
CO2: 24 mmol/L (ref 22–32)
CREATININE: 1.26 mg/dL — AB (ref 0.61–1.24)
Calcium: 6.9 mg/dL — ABNORMAL LOW (ref 8.9–10.3)
GFR calc non Af Amer: >60 mL/min (ref >60)
Glucose, Bld: 257 mg/dL — ABNORMAL HIGH (ref 65–99)
Potassium: 3.3 mmol/L — ABNORMAL LOW (ref 3.5–5.1)
Sodium: 152 mmol/L — ABNORMAL HIGH (ref 135–145)

## 2018-02-08 LAB — HEMOGLOBIN AND HEMATOCRIT, BLOOD
HEMATOCRIT: 28.4 % — AB (ref 39.0–52.0)
HEMOGLOBIN: 8.2 g/dL — AB (ref 13.0–17.0)

## 2018-02-08 LAB — CBC
HCT: 21.6 % — ABNORMAL LOW (ref 39.0–52.0)
HEMATOCRIT: 20.1 % — AB (ref 39.0–52.0)
HEMOGLOBIN: 5.7 g/dL — AB (ref 13.0–17.0)
Hemoglobin: 6.3 g/dL — CL (ref 13.0–17.0)
MCH: 28.9 pg (ref 26.0–34.0)
MCH: 29.7 pg (ref 26.0–34.0)
MCHC: 28.4 g/dL — ABNORMAL LOW (ref 30.0–36.0)
MCHC: 29.2 g/dL — ABNORMAL LOW (ref 30.0–36.0)
MCV: 102 fL — AB (ref 78.0–100.0)
MCV: 101.9 fL — ABNORMAL HIGH (ref 78.0–100.0)
PLATELETS: 282 10*3/uL (ref 150–400)
Platelets: 293 10*3/uL (ref 150–400)
RBC: 1.97 MIL/uL — AB (ref 4.22–5.81)
RBC: 2.12 MIL/uL — ABNORMAL LOW (ref 4.22–5.81)
RDW: 14.8 % (ref 11.5–15.5)
RDW: 15.3 % (ref 11.5–15.5)
WBC: 14 10*3/uL — ABNORMAL HIGH (ref 4.0–10.5)
WBC: 14.2 10*3/uL — ABNORMAL HIGH (ref 4.0–10.5)

## 2018-02-08 LAB — GLUCOSE, CAPILLARY
GLUCOSE-CAPILLARY: 139 mg/dL — AB (ref 65–99)
GLUCOSE-CAPILLARY: 138 mg/dL — AB (ref 65–99)
GLUCOSE-CAPILLARY: 128 mg/dL — AB (ref 65–99)
Glucose-Capillary: 190 mg/dL — ABNORMAL HIGH (ref 65–99)
Glucose-Capillary: 154 mg/dL — ABNORMAL HIGH (ref 65–99)
Glucose-Capillary: 120 mg/dL — ABNORMAL HIGH (ref 65–99)
Glucose-Capillary: 114 mg/dL — ABNORMAL HIGH (ref 65–99)

## 2018-02-08 LAB — CULTURE, BLOOD (ROUTINE X 2)
CULTURE: NO GROWTH
Culture: NO GROWTH
SPECIAL REQUESTS: ADEQUATE
Special Requests: ADEQUATE — AB

## 2018-02-08 LAB — PREPARE RBC (CROSSMATCH)

## 2018-02-08 MED ORDER — SODIUM CHLORIDE 0.9 % IV SOLN: Freq: Once | INTRAVENOUS | Status: DC

## 2018-02-08 MED ORDER — FREE WATER
400.0000 mL | Status: DC
  Administered 2018-02-08 – 2018-02-10 (×12): 400 mL

## 2018-02-08 MED ORDER — ALTEPLASE 2 MG IJ SOLR
2.0000 mg | Freq: Once | INTRAMUSCULAR | Status: AC
  Administered 2018-02-08: 2 mg
  Filled 2018-02-08: qty 2

## 2018-02-08 MED ORDER — FUROSEMIDE 10 MG/ML IJ SOLN
40.0000 mg | Freq: Once | INTRAMUSCULAR | Status: AC
  Administered 2018-02-08: 40 mg via INTRAVENOUS
  Filled 2018-02-08: qty 4

## 2018-02-08 MED ORDER — POTASSIUM CHLORIDE 20 MEQ/15ML (10%) PO SOLN
40.0000 meq | Freq: Three times a day (TID) | ORAL | Status: AC
  Administered 2018-02-08 (×3): 40 meq via ORAL
  Filled 2018-02-08 (×2): qty 30

## 2018-02-08 MED ORDER — SODIUM CHLORIDE 0.9 % IV SOLN: Freq: Once | INTRAVENOUS | Status: AC

## 2018-02-08 NOTE — Progress Notes (Signed)
TCTS BRIEF SICU PROGRESS NOTE  17 Days Post-Op  S/P Procedure(s) (LRB): CORONARY ARTERY BYPASS GRAFTING (CABG) TIMES Four,  USING LEFT INTERNAL MAMMARY ARTERY, RIGHT RADIAL ARTERY AND RIGHT SAPHENOUS LEG VEIN HARVESTED ENDOSCOPICALLY (N/A) RADIAL ARTERY HARVEST (Right) TRANSESOPHAGEAL ECHOCARDIOGRAM (TEE) (N/A)   No significant change Low grade fever all day long NSR w/ stable BP Repeat Hgb 8.2, early lab draws suspected to be spurious  Plan: Continue current plan  Purcell Nailslarence H Owen, MD 02/08/2018 5:54 PM

## 2018-02-08 NOTE — Progress Notes (Signed)
17 Days Post-Op Procedure(s) (LRB): CORONARY ARTERY BYPASS GRAFTING (CABG) TIMES Four,  USING LEFT INTERNAL MAMMARY ARTERY, RIGHT RADIAL ARTERY AND RIGHT SAPHENOUS LEG VEIN HARVESTED ENDOSCOPICALLY (N/A) RADIAL ARTERY HARVEST (Right) TRANSESOPHAGEAL ECHOCARDIOGRAM (TEE) (N/A) Subjective: Intubated, unresponsive  Objective: Vital signs in last 24 hours: Temp:  [99.4 F (37.4 C)-102.2 F (39 C)] 100.4 F (38 C) (03/18 0700) Pulse Rate:  [81-99] 89 (03/18 0755) Cardiac Rhythm: Normal sinus rhythm (03/18 0400) Resp:  [9-27] 17 (03/18 0755) BP: (100-188)/(47-81) 144/62 (03/18 0755) SpO2:  [92 %-99 %] 98 % (03/18 0700) FiO2 (%):  [30 %] 30 % (03/18 0755) Weight:  [334 lb 10.5 oz (151.8 kg)] 334 lb 10.5 oz (151.8 kg) (03/18 0400)  Hemodynamic parameters for last 24 hours:    Intake/Output from previous day: 03/17 0701 - 03/18 0700 In: 4461.2 [I.V.:2441.2; NG/GT:1320; IV Piggyback:700] Out: 2105 [Urine:2105] Intake/Output this shift: No intake/output data recorded.  General appearance: unresponsive Neurologic: per Neuro- unresponsive at present Heart: regular rate and rhythm Lungs: clear to auscultation bilaterally Wound: clean and dry  Lab Results: Recent Labs    02/08/18 0434 02/08/18 0629  WBC 14.0* 14.2*  HGB 5.7* 6.3*  HCT 20.1* 21.6*  PLT 282 293   BMET:  Recent Labs    02/07/18 0441 02/08/18 0434  NA 161* 152*  K 4.0 3.3*  CL 128* 121*  CO2 27 24  GLUCOSE 275* 257*  BUN 45* 43*  CREATININE 1.34* 1.26*  CALCIUM 7.7* 6.9*    PT/INR: No results for input(s): LABPROT, INR in the last 72 hours. ABG    Component Value Date/Time   PHART 7.427 02/05/2018 0335   HCO3 30.0 (H) 02/05/2018 0335   TCO2 34 (H) 02/04/2018 0152   ACIDBASEDEF 3.0 (H) 01/23/2018 0846   O2SAT 97.1 02/05/2018 0335   CBG (last 3)  Recent Labs    02/07/18 1143 02/07/18 1927 02/07/18 2331  GLUCAP 259* 190* 183*    Assessment/Plan: S/P Procedure(s) (LRB): CORONARY ARTERY  BYPASS GRAFTING (CABG) TIMES Four,  USING LEFT INTERNAL MAMMARY ARTERY, RIGHT RADIAL ARTERY AND RIGHT SAPHENOUS LEG VEIN HARVESTED ENDOSCOPICALLY (N/A) RADIAL ARTERY HARVEST (Right) TRANSESOPHAGEAL ECHOCARDIOGRAM (TEE) (N/A) -   NEURO- Right CVA with left hemiparesis, unresponsive since becoming septic and having additional CVA   CV- In SR. On dopamine, neo and norepi drips, maintaining adequate BP  RESP_ VDRF- unable to wean due to mental status  CXR appears essentially unchanged  RENAL- creatinine down a little. Hypernatremia improved  Hypokalemia- supplement  ENDO- CBG still elevated but trending better  ID- on Vanco and cefepime for presumed pneumonia  Sputum culture normal flora, blood culture 1/2 initial report aerococcus, but culture NG at 4 days   Prognosis remains poor   LOS: 17 days    Loreli SlotSteven C Hendrickson 02/08/2018

## 2018-02-08 NOTE — Progress Notes (Signed)
Inpatient Diabetes Program Recommendations  AACE/ADA: New Consensus Statement on Inpatient Glycemic Control (2015)  Target Ranges:  Prepandial:   less than 140 mg/dL      Peak postprandial:   less than 180 mg/dL (1-2 hours)      Critically ill patients:  140 - 180 mg/dL  Results for Nicholas Mejia, Nicholas Mejia (MRN [id number]) as of 02/08/2018 08:43  Ref. Range 02/08/2018 04:34  Glucose Latest Ref Range: 65 - 99 mg/dL 782257 (H)   Results for Nicholas Mejia, Nicholas Mejia (MRN [id number]) as of 02/08/2018 08:43  Ref. Range 02/07/2018 08:43 02/07/2018 11:43 02/07/2018 19:27 02/07/2018 23:31  Glucose-Capillary Latest Ref Range: 65 - 99 mg/dL 578229 (H) 469259 (H) 629190 (H) 183 (H)   Review of Glycemic Control  Diabetes history: DM2 Outpatient Diabetes medications: Metformin 500 mg BID Current orders for Inpatient glycemic control: Levemir 65 units BID, Novolog 0-20 units Q4H  Inpatient Diabetes Program Recommendations: Insulin - Basal: Please consider increasing Levemir to 68 units BID.  Thanks, Orlando PennerMarie Kenith Trickel, RN, MSN, CDE Diabetes Coordinator Inpatient Diabetes Program [phone] (Team Pager from 8am to 5pm)

## 2018-02-08 NOTE — Progress Notes (Signed)
CRITICAL VALUE ALERT  Critical Value: HGB 5.7  Date & Time Notied:  02/08/18  Provider Notified: Tyrone SageGerhardt MD  Orders Received/Actions taken: Repeat CBC to ensure accuracy and Type and Screen 2 units

## 2018-02-08 NOTE — Progress Notes (Signed)
PULMONARY / CRITICAL CARE MEDICINE   Name: Nicholas Mejia MRN: [id number] DOB: [birth year]    ADMISSION DATE:  02/16/2018 CONSULTATION DATE: March 14   REFERRING MD:  Dr. Dorris FetchHendrickson  CHIEF COMPLAINT: Confusion after coronary artery bypass grafting  HISTORY OF PRESENT ILLNESS:   47 year old male with a past medical history significant for coronary artery disease who has an LVEF of 40% underwent a coronary artery bypass graft on January 22, 2018.  Afterwards was noted to have altered mental status with a new right sided ischemic stroke.  Ultimately required intubation on March 13.  SUBJECTIVE:  Fever overnight  VITAL SIGNS: BP 130/63   Pulse 86   Temp (!) 100.9 F (38.3 C)   Resp 19   Ht 6' 2.02" (1.88 m)   Wt (!) 334 lb 10.5 oz (151.8 kg)   SpO2 94%   BMI 42.95 kg/m   HEMODYNAMICS:    VENTILATOR SETTINGS: Vent Mode: PRVC FiO2 (%):  [30 %] 30 % Set Rate:  [14 bmp] 14 bmp Vt Set:  [660 mL] 660 mL PEEP:  [5 cmH20] 5 cmH20 Plateau Pressure:  [20 cmH20-28 cmH20] 22 cmH20  INTAKE / OUTPUT: I/O last 3 completed shifts: In: 7605.4 [I.V.:3925.4; NG/GT:2180; IV Piggyback:1500] Out: 3205 [Urine:3205]  PHYSICAL EXAMINATION:  General:  Unresponsive on the vent, acutely ill appearing HENT: Short/AT, PERRL, EOM-spontaneous and MMM PULM: CTA B, vent supported breathing CV: RRR, Nl S1/S2 and -M/R/G. GI: Soft, NT, ND and +BS MSK: Normal bulk and tone Neuro: Minimal responsiveness on exam Skin: intact  LABS:  BMET Recent Labs  Lab 02/06/18 1231 02/07/18 0441 02/08/18 0434  NA 160* 161* 152*  K 4.1 4.0 3.3*  CL 126* 128* 121*  CO2 26 27 24   BUN 54* 45* 43*  CREATININE 1.60* 1.34* 1.26*  GLUCOSE 322* 275* 257*   Electrolytes Recent Labs  Lab 02/04/18 0306  02/06/18 1231 02/07/18 0441 02/08/18 0434  CALCIUM 6.6*   < > 7.5* 7.7* 6.9*  MG 2.7*  --   --  3.0*  --   PHOS 4.0  --   --  3.8  --    < > = values in this interval not displayed.   CBC Recent Labs  Lab  02/07/18 0441 02/08/18 0434 02/08/18 0629  WBC 14.6* 14.0* 14.2*  HGB 8.8* 5.7* 6.3*  HCT 31.1* 20.1* 21.6*  PLT 336 282 293   Coag's No results for input(s): APTT, INR in the last 168 hours.  Sepsis Markers No results for input(s): LATICACIDVEN, PROCALCITON, O2SATVEN in the last 168 hours.  ABG Recent Labs  Lab 02/03/18 2017 02/04/18 0152 02/05/18 0335  PHART 7.402 7.375 7.427  PCO2ART 51.5* 56.7* 46.4  PO2ART 92.0 118.0* 91.8   Liver Enzymes Recent Labs  Lab 02/04/18 0306  AST 161*  ALT 73*  ALKPHOS 49  BILITOT 1.4*  ALBUMIN 2.1*   Cardiac Enzymes No results for input(s): TROPONINI, PROBNP in the last 168 hours.  Glucose Recent Labs  Lab 02/07/18 1143 02/07/18 1529 02/07/18 1927 02/07/18 2331 02/08/18 0350 02/08/18 0827  GLUCAP 259* 190* 190* 183* 154* 120*   Imaging Ct Head Wo Contrast  Result Date: 02/07/2018 CLINICAL DATA:  Follow-up examination for acute stroke. EXAM: CT HEAD WITHOUT CONTRAST TECHNIQUE: Contiguous axial images were obtained from the base of the skull through the vertex without intravenous contrast. COMPARISON:  Prior CT from 02/03/2018 as well as multiple previous exams. FINDINGS: Brain: Continued interval evolution of cytotoxic edema involving the periventricular white matter of  the right frontal and parietal region, consistent with evolving acute right MCA watershed type area infarcts. Extension into the right basal ganglia. Additional evolving hypodensity involving the body and splenium of the corpus callosum, with extension into the subcortical white matter of the left frontal region, also compatible with infarct, more conspicuous as compared to previous exam. Evolving left cerebellar infarcts are grossly stable in distribution as compared to previous. Localized mass effect about the areas of infarction without significant regional mass effect. No evidence for hemorrhagic transformation. No other evidence for new or acute infarction. No  acute intracranial hemorrhage. No mass lesion or midline shift. No hydrocephalus. No extra-axial fluid collection. Vascular: No hyperdense vessel. Skull: Scalp soft tissues and calvarium demonstrate no acute abnormality. Sinuses/Orbits: Globes and orbital soft tissues within normal limits. Scattered mucosal thickening throughout the paranasal sinuses with small bilateral mastoid effusions. Nasogastric tube in place. Other: None. IMPRESSION: 1. Continued interval evolution of subacute infarcts involving the right MCA cerebral white matter, corpus callosum/subcortical left frontal region, and left cerebellar hemisphere, overall relatively similar in distribution as compared to previous. No significant mass effect or evidence for hemorrhagic transformation. 2. No other new acute intracranial abnormality. Electronically Signed   By: Rise Mu M.D.   On: 02/07/2018 21:53   Dg Chest Port 1 View  Result Date: 02/08/2018 CLINICAL DATA:  Ventilator dependence. EXAM: PORTABLE CHEST 1 VIEW COMPARISON:  02/07/2018 FINDINGS: 0547 hours. Endotracheal tube tip is 6.6 cm above the base the carina. The NG tube passes into the stomach although the distal tip position is not included on the film. Left-sided central line tip overlies the distal SVC. Cardiopericardial silhouette is markedly enlarged. Lung volumes low. Retrocardiac collapse/consolidation persists. Telemetry leads overlie the chest. IMPRESSION: No substantial change. Cardiomegaly with left base collapse/consolidation. Electronically Signed   By: Kennith Center M.D.   On: 02/08/2018 08:36   STUDIES:  CT head 3/2 > Negative CTA for large vessel occlusion, Small volume acute ischemic left PICA territory infarct. 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 While subocclusive thrombus would be difficult to exclude, this is felt to be less likely. Query an episode of recent hypotension. Advanced  atherosclerotic change affecting the cavernous ICAs bilaterally, left greater than right, slightly progressed from previous. Short-segment 50% proximal left M1 stenosis. CT head 3/4 > new patchy hypodensities in the right corona radiata and right caudate nucleus consistent with acute infarcts. CT head 3/13 > Increasing or new regions of hypoattenuation scattered throughout right MCA distribution white matter, involving body and splenium of corpus callosum, and in the left lateral cerebellar hemisphere adjacent to prior PICA infarction likely representing interval areas of acute infarction. CTA head 3/14 >No emergent large vessel occlusion.  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  Approximately 50% stenosis of the distal right common carotid artery, unchanged. EEG 3/14 > diffuse slowing, focal slowing in Rt hemisphere compared to Lt CT 3/17 with extension of MCA CVA  CULTURES: Blood 3/13 > Aerococcus Urine 3/13>>> Tracheal aspirate 3/13 >  ANTIBIOTICS: Cefepime 3/13 > Vancomycin 3/13 >  SIGNIFICANT EVENTS: 3/1 admit for CABG 3/2 lethargy, new stroke identified 3/13 worsening lethargy after about a weak of slow progression.  3/14 intubated for airway protection.   LINES/TUBES: PICC 3/2>>> Cortrak 3/12>>> ETT 3/14>>> Foley 3/14>>>  DISCUSSION: 47 year old male who experienced an ischemic stroke after coronary artery bypass grafting.  Now with altered mental status making it unable for him to protect his airway.  ASSESSMENT / PLAN:  PULMONARY A: Acute respiratory failure with hypoxemia Obstructive sleep apnea P:   Begin PS trials but no extubation given mental status Ventilator associated pneumonia prevention protocol Titrate O2 for sat of 88-92% Need to discuss trach with family prior to decision on extubation  CARDIOVASCULAR A:  CAD, s/p CABG Septic shock P:   Tele Continue neo, levophed, dopamine per TCTS Attempt to get neo off first Check CVP once PICC line is unclogged  RENAL A:   Hypernatremia P:   D/C D5 Increase free water to 400 ml q4 Replace electrolytes as indicated BMET in AM  GASTROINTESTINAL A:   No acute issue P:   Continue tube feeding  HEMATOLOGIC A:   Mild anemia no bleeding P:  Monitor for bleeding Transfuse per ICU protocol  INFECTIOUS A:   Fever [facility] associated pneumonia Likely left maxillary sinusitis  P:   Continue saline sprays Continue Vanco and Cefepime Follow-up cultures  ENDOCRINE A:   Hyperglycemia, worse on D5 infusion  P:   Increase Levemir CBG ISS  NEUROLOGIC A:   Ischemic stroke, posterior MCA distribution on right Encephalopathy, acute due to stroke and toxic metabolic P:   RASS goal: 0 PAD protocol, minimize sedation Daily wakeup assessment Follow-up neurology recommendations, question for today is what to do about the hyponatremia D/C all sedation, none for days now  FAMILY  - Updates: No family bedside 3/18  - Inter-disciplinary family meet or Palliative Care meeting due by:  day 7  The patient is critically ill with multiple organ systems failure and requires high complexity decision making for assessment and support, frequent evaluation and titration of therapies, application of advanced monitoring technologies and extensive interpretation of multiple databases.   Critical Care Time devoted to patient care services described in this note is  35  Minutes. This time reflects time of care of this signee Dr Koren Bound. This critical care time does not reflect procedure time, or teaching time or supervisory time of PA/NP/Med student/Med Resident etc but could involve care discussion time.  Alyson Reedy, M.D. [facility] Pulmonary/Critical Care Medicine. Pager: [phone]. After hours pager: [phone].  02/08/2018, 10:20 AM

## 2018-02-09 ENCOUNTER — Inpatient Hospital Stay (HOSPITAL_COMMUNITY): Payer: BLUE CROSS/BLUE SHIELD

## 2018-02-09 DIAGNOSIS — R509 Fever, unspecified: Secondary | ICD-10-CM

## 2018-02-09 LAB — CBC
HEMATOCRIT: 24.8 % — AB (ref 39.0–52.0)
HEMOGLOBIN: 7.3 g/dL — AB (ref 13.0–17.0)
MCH: 30.2 pg (ref 26.0–34.0)
MCHC: 29.4 g/dL — AB (ref 30.0–36.0)
MCV: 102.5 fL — AB (ref 78.0–100.0)
Platelets: 263 10*3/uL (ref 150–400)
RBC: 2.42 MIL/uL — ABNORMAL LOW (ref 4.22–5.81)
RDW: 14.7 % (ref 11.5–15.5)
WBC: 11.6 10*3/uL — ABNORMAL HIGH (ref 4.0–10.5)

## 2018-02-09 LAB — BASIC METABOLIC PANEL
Anion gap: 9 (ref 5–15)
BUN: 44 mg/dL — ABNORMAL HIGH (ref 6–20)
CHLORIDE: 119 mmol/L — AB (ref 101–111)
CO2: 24 mmol/L (ref 22–32)
Calcium: 7.5 mg/dL — ABNORMAL LOW (ref 8.9–10.3)
Creatinine, Ser: 1.23 mg/dL (ref 0.61–1.24)
GFR calc Af Amer: >60 mL/min (ref >60)
GFR calc non Af Amer: >60 mL/min (ref >60)
GLUCOSE: 233 mg/dL — AB (ref 65–99)
Potassium: 4.3 mmol/L (ref 3.5–5.1)
Sodium: 152 mmol/L — ABNORMAL HIGH (ref 135–145)

## 2018-02-09 LAB — BLOOD GAS, ARTERIAL
ACID-BASE EXCESS: 1.4 mmol/L (ref 0.0–2.0)
Bicarbonate: 25.9 mmol/L (ref 20.0–28.0)
Drawn by: 23604
FIO2: 30
O2 SAT: 95.1 %
PATIENT TEMPERATURE: 100
PCO2 ART: 45.5 mmHg (ref 32.0–48.0)
PEEP/CPAP: 5 cmH2O
PH ART: 7.378 (ref 7.350–7.450)
RATE: 14 resp/min
VT: 660 mL
pO2, Arterial: 81.5 mmHg — ABNORMAL LOW (ref 83.0–108.0)

## 2018-02-09 LAB — MAGNESIUM: Magnesium: 2.8 mg/dL — ABNORMAL HIGH (ref 1.7–2.4)

## 2018-02-09 LAB — GLUCOSE, CAPILLARY
GLUCOSE-CAPILLARY: 154 mg/dL — AB (ref 65–99)
GLUCOSE-CAPILLARY: 167 mg/dL — AB (ref 65–99)
GLUCOSE-CAPILLARY: 110 mg/dL — AB (ref 65–99)
Glucose-Capillary: 130 mg/dL — ABNORMAL HIGH (ref 65–99)
Glucose-Capillary: 118 mg/dL — ABNORMAL HIGH (ref 65–99)

## 2018-02-09 LAB — PHOSPHORUS: PHOSPHORUS: 4.2 mg/dL (ref 2.5–4.6)

## 2018-02-09 LAB — VANCOMYCIN, TROUGH: VANCOMYCIN TR: 22 ug/mL — AB (ref 15–20)

## 2018-02-09 MED ORDER — FUROSEMIDE 10 MG/ML IJ SOLN
20.0000 mg | Freq: Four times a day (QID) | INTRAMUSCULAR | Status: AC
  Administered 2018-02-09 (×3): 20 mg via INTRAVENOUS
  Filled 2018-02-09 (×3): qty 2

## 2018-02-09 MED ORDER — INSULIN DETEMIR 100 UNIT/ML ~~LOC~~ SOLN
75.0000 [IU] | Freq: Two times a day (BID) | SUBCUTANEOUS | Status: DC
  Administered 2018-02-09 – 2018-02-11 (×6): 75 [IU] via SUBCUTANEOUS
  Filled 2018-02-09 (×7): qty 0.75

## 2018-02-09 MED ORDER — VANCOMYCIN HCL IN DEXTROSE 1-5 GM/200ML-% IV SOLN
1000.0000 mg | Freq: Two times a day (BID) | INTRAVENOUS | Status: DC
  Administered 2018-02-09 – 2018-02-11 (×4): 1000 mg via INTRAVENOUS
  Filled 2018-02-09 (×4): qty 200

## 2018-02-09 NOTE — Progress Notes (Addendum)
TCTS DAILY ICU PROGRESS NOTE                   [address]            [place] [address]          [phone]   18 Days Post-Op Procedure(s) (LRB): CORONARY ARTERY BYPASS GRAFTING (CABG) TIMES Four,  USING LEFT INTERNAL MAMMARY ARTERY, RIGHT RADIAL ARTERY AND RIGHT SAPHENOUS LEG VEIN HARVESTED ENDOSCOPICALLY (N/A) RADIAL ARTERY HARVEST (Right) TRANSESOPHAGEAL ECHOCARDIOGRAM (TEE) (N/A)  Total Length of Stay:  LOS: 18 days   Subjective: Remains unresponsive on the vent  Objective: Vital signs in last 24 hours: Temp:  [99.9 F (37.7 C)-101.5 F (38.6 C)] 100 F (37.8 C) (03/19 0700) Pulse Rate:  [77-95] 79 (03/19 0700) Cardiac Rhythm: Normal sinus rhythm (03/19 0400) Resp:  [14-25] 14 (03/19 0700) BP: (98-159)/(40-115) 137/46 (03/19 0700) SpO2:  [91 %-100 %] 98 % (03/19 0700) FiO2 (%):  [30 %] 30 % (03/19 0545) Weight:  [338 lb 3 oz (153.4 kg)] 338 lb 3 oz (153.4 kg) (03/19 0600)  Filed Weights   02/07/18 0300 02/08/18 0400 02/09/18 0600  Weight: (!) 332 lb 14.3 oz (151 kg) (!) 334 lb 10.5 oz (151.8 kg) (!) 338 lb 3 oz (153.4 kg)    Weight change: 3 lb 8.4 oz (1.6 kg)   Hemodynamic parameters for last 24 hours:    Intake/Output from previous day: 03/18 0701 - 03/19 0700 In: 4547 [I.V.:2097; NG/GT:1900; IV Piggyback:550] Out: 9833 [Urine:3105]  Intake/Output this shift: No intake/output data recorded.  Current Meds: Scheduled Meds: . aspirin  325 mg Per Tube Daily  . bisacodyl  10 mg Oral Daily   Or  . bisacodyl  10 mg Rectal Daily  . chlorhexidine gluconate (MEDLINE KIT)  15 mL Mouth Rinse BID  . Chlorhexidine Gluconate Cloth  6 each Topical Daily  . clopidogrel  75 mg Oral Daily  . docusate  200 mg Per Tube Daily  . enoxaparin (LOVENOX) injection  40 mg Subcutaneous QHS  . feeding supplement (PRO-STAT SUGAR FREE 64)  60 mL Per Tube QID  . fentaNYL (SUBLIMAZE) injection  50 mcg Intravenous Once  . free water  400 mL Per Tube Q4H  .  insulin aspart  0-20 Units Subcutaneous Q4H  . insulin detemir  65 Units Subcutaneous BID  . mouth rinse  15 mL Mouth Rinse 10 times per day  . pantoprazole sodium  40 mg Per Tube Daily  . tamsulosin  0.4 mg Oral Daily   Continuous Infusions: . sodium chloride    . ceFEPime (MAXIPIME) IV Stopped (02/09/18 0218)  . dextrose 50 mL/hr at 02/09/18 0600  . DOPamine 3 mcg/kg/min (02/09/18 0600)  . feeding supplement (JEVITY 1.2 CAL) 1,000 mL (02/09/18 0600)  . fentaNYL infusion INTRAVENOUS    . norepinephrine (LEVOPHED) Adult infusion 4 mcg/min (02/09/18 0645)  . phenylephrine (NEO-SYNEPHRINE) Adult infusion 65 mcg/min (02/09/18 0600)  . vancomycin Stopped (02/09/18 0353)   PRN Meds:.acetaminophen (TYLENOL) oral liquid 160 mg/5 mL, fentaNYL, hydrALAZINE, midazolam, montelukast, ondansetron (ZOFRAN) IV, sodium chloride, sodium chloride flush, traMADol  General appearance: unresponsive Neurologic: no change Heart: regular rate and rhythm Lungs: dim in bases Abdomen: soft, nondistended, + BS Extremities: + edematous Wound: incis healing well  Lab Results: CBC: Recent Labs    02/08/18 0629 02/08/18 0939 02/09/18 0337  WBC 14.2*  --  11.6*  HGB 6.3* 8.2* 7.3*  HCT 21.6* 28.4* 24.8*  PLT 293  --  263   BMET:  Recent Labs    02/08/18 0434 02/09/18 0337  NA 152* 152*  K 3.3* 4.3  CL 121* 119*  CO2 24 24  GLUCOSE 257* 233*  BUN 43* 44*  CREATININE 1.26* 1.23  CALCIUM 6.9* 7.5*    CMET: Lab Results  Component Value Date   WBC 11.6 (H) 02/09/2018   HGB 7.3 (L) 02/09/2018   HCT 24.8 (L) 02/09/2018   PLT 263 02/09/2018   GLUCOSE 233 (H) 02/09/2018   CHOL 83 01/24/2018   TRIG 80 01/24/2018   HDL 31 (L) 01/24/2018   LDLCALC 36 01/24/2018   ALT 73 (H) 02/04/2018   AST 161 (H) 02/04/2018   NA 152 (H) 02/09/2018   K 4.3 02/09/2018   CL 119 (H) 02/09/2018   CREATININE 1.23 02/09/2018   BUN 44 (H) 02/09/2018   CO2 24 02/09/2018   TSH 0.303 (L) 02/04/2018   INR 1.33  02/18/2018   HGBA1C 7.2 (H) 01/24/2018      PT/INR: No results for input(s): LABPROT, INR in the last 72 hours. Radiology: Dg Chest Port 1 View  Result Date: 02/09/2018 CLINICAL DATA:  Endotracheal tube position EXAM: PORTABLE CHEST 1 VIEW COMPARISON:  02/08/2018 FINDINGS: Endotracheal tube in good position. Gastric tube enters the stomach. Left arm PICC tip in the SVC parent Moderate left lower lobe airspace disease and small left effusion unchanged. Progression of right lower lobe airspace disease. No effusion on the right IMPRESSION: Endotracheal tube in good position Progressive right lower lobe infiltrate/atelectasis Left lower lobe consolidation unchanged. Electronically Signed   By: Franchot Gallo M.D.   On: 02/09/2018 07:58     Assessment/Plan: S/P Procedure(s) (LRB): CORONARY ARTERY BYPASS GRAFTING (CABG) TIMES Four,  USING LEFT INTERNAL MAMMARY ARTERY, RIGHT RADIAL ARTERY AND RIGHT SAPHENOUS LEG VEIN HARVESTED ENDOSCOPICALLY (N/A) RADIAL ARTERY HARVEST (Right) TRANSESOPHAGEAL ECHOCARDIOGRAM (TEE) (N/A)  1 neuro status remains unchanged 2 dopamine 3 mcg, creat slightly better, excellent UO, weight increased, doubt accurate 3 On norepi with adeq BP in combination with neo- trying top wean Neo currently 4 Leukocytosis improved, Tmax101.5- cont Vanco and cefipime- pharm assisting with dosing 5 TF for nutrition 6  CBG's 200's, increase insulin, cont SSI 7 prognosis remains poor Nicholas Mejia 02/09/2018 8:04 AM   Patient seen and examined, agree with above He is weaning off neo, BP stable Weaning vent during day with plan to rest overnight Mother at bedside this evening. Updated on status. Prognosis is guarded. She remains very optimistic, but understands the seriousness of his condition  Remo Lipps C. Roxan Hockey, MD [facility] [phone]

## 2018-02-09 NOTE — Progress Notes (Addendum)
Pharmacy Antibiotic Note  Nicholas Mejia is a 47 y.o. male admitted on 02/02/2018 with sepsis.  Pharmacy has been consulted for Vancomycin and cefepime dosing.   Fever of 101.5 overnight, wbc trending down to 11. Vancomycin trough was therapeutic at 17 on 3/16, but now slightly above goal at 22 this am. Level drawn  1 hour prior to next dose but patient still likely accumulating. Scr stable at 1.2 with good uop.   Antimicrobials this admission:  Vanc 3/13 >  Cefepime 3/13 >  Zosyn 3/13 Levaquin 3/1> 3/2 (periop)  Dose adjustments this admission:  3/16 VT: 17, cont 1250 mg IV q12h 3/19 VT 22, Reduce vancomycin to 1g q12 hours Microbiology results:  3/13 BCx x 2: 1/2 aerococcus 3/13 UCx: neg 3/13 sputum: normal flora  Plan: Vancomycin 1000 mg IV q12h Re-check trough later this week if indicated  Height: 6' 2.02" (188 cm) Weight: (!) 338 lb 3 oz (153.4 kg) IBW/kg (Calculated) : 82.24  Temp (24hrs), Avg:100.8 F (38.2 C), Min:99.9 F (37.7 C), Max:101.5 F (38.6 C)  Recent Labs  Lab 02/05/18 0441 02/06/18 0216 02/06/18 1231 02/07/18 0441 02/08/18 0434 02/08/18 0629 02/09/18 0337  WBC 17.7* 14.0* 12.2* 14.6* 14.0* 14.2* 11.6*  CREATININE 1.38*  --  1.60* 1.34* 1.26*  --  1.23  VANCOTROUGH  --  17  --   --   --   --   --     Estimated Creatinine Clearance: 115 mL/min (by C-G formula based on SCr of 1.23 mg/dL).    Allergies  Allergen Reactions  . Bee Venom Anaphylaxis and Swelling  . Metoprolol Shortness Of Breath  . Penicillins Swelling    Swelling in feet Has patient had a PCN reaction causing immediate rash, facial/tongue/throat swelling, SOB or lightheadedness with hypotension: No Has patient had a PCN reaction causing severe rash involving mucus membranes or skin necrosis: No Has patient had a PCN reaction that required hospitalization: No Has patient had a PCN reaction occurring within the last 10 years: No If all of the above answers are "NO", then may proceed  with Cephalosporin use.  . Pollen Extract     UNSPECIFIED REACTION    Nicholas Mejia PharmD., BCPS Clinical Pharmacist 02/09/2018 9:03 AM

## 2018-02-09 NOTE — Progress Notes (Signed)
PULMONARY / CRITICAL CARE MEDICINE   Name: Nicholas Mejia Jividen MRN: [id number] DOB: 09/18/1971    ADMISSION DATE:  02/16/2018 CONSULTATION DATE: March 14   REFERRING MD:  Dr. Dorris FetchHendrickson  CHIEF COMPLAINT: Confusion after coronary artery bypass grafting  HISTORY OF PRESENT ILLNESS:   47 year old male with a past medical history significant for coronary artery disease who has an LVEF of 40% underwent a coronary artery bypass graft on January 22, 2018.  Afterwards was noted to have altered mental status with a new right sided ischemic stroke.  Ultimately required intubation on March 13.  SUBJECTIVE:  Continues to be febrile overnight but WBC is improved  VITAL SIGNS: BP (!) 100/39   Pulse 78   Temp 100 F (37.8 C)   Resp (!) 0   Ht 6' 2.02" (1.88 m)   Wt (!) 338 lb 3 oz (153.4 kg)   SpO2 98%   BMI 43.40 kg/m   HEMODYNAMICS:    VENTILATOR SETTINGS: Vent Mode: PRVC FiO2 (%):  [30 %] 30 % Set Rate:  [14 bmp] 14 bmp Vt Set:  [660 mL] 660 mL PEEP:  [5 cmH20] 5 cmH20 Pressure Support:  [10 cmH20] 10 cmH20 Plateau Pressure:  [18 cmH20-21 cmH20] 21 cmH20  INTAKE / OUTPUT: I/O last 3 completed shifts: In: 6585.6 [I.V.:3305.6; NG/GT:2380; IV Piggyback:900] Out: 4130 [Urine:4130]  PHYSICAL EXAMINATION:  General:  Remains unresponsive, completely  HENT: [place]/AT, PERRL, EOM-spontaneous and MMM PULM: CTA B, vent supported breathing CV: RRR, Nl S1/S2 and -M/R/G. GI: Soft, NT, ND and +BS MSK: Normal bulk and tone Neuro: Minimal responsiveness on exam Skin: intact  LABS:  BMET Recent Labs  Lab 02/07/18 0441 02/08/18 0434 02/09/18 0337  NA 161* 152* 152*  K 4.0 3.3* 4.3  CL 128* 121* 119*  CO2 27 24 24   BUN 45* 43* 44*  CREATININE 1.34* 1.26* 1.23  GLUCOSE 275* 257* 233*   Electrolytes Recent Labs  Lab 02/04/18 0306  02/07/18 0441 02/08/18 0434 02/09/18 0337  CALCIUM 6.6*   < > 7.7* 6.9* 7.5*  MG 2.7*  --  3.0*  --  2.8*  PHOS 4.0  --  3.8  --  4.2   < > = values in  this interval not displayed.   CBC Recent Labs  Lab 02/08/18 0434 02/08/18 0629 02/08/18 0939 02/09/18 0337  WBC 14.0* 14.2*  --  11.6*  HGB 5.7* 6.3* 8.2* 7.3*  HCT 20.1* 21.6* 28.4* 24.8*  PLT 282 293  --  263   Coag's No results for input(s): APTT, INR in the last 168 hours.  Sepsis Markers No results for input(s): LATICACIDVEN, PROCALCITON, O2SATVEN in the last 168 hours.  ABG Recent Labs  Lab 02/04/18 0152 02/05/18 0335 02/09/18 0535  PHART 7.375 7.427 7.378  PCO2ART 56.7* 46.4 45.5  PO2ART 118.0* 91.8 81.5*   Liver Enzymes Recent Labs  Lab 02/04/18 0306  AST 161*  ALT 73*  ALKPHOS 49  BILITOT 1.4*  ALBUMIN 2.1*   Cardiac Enzymes No results for input(s): TROPONINI, PROBNP in the last 168 hours.  Glucose Recent Labs  Lab 02/08/18 1114 02/08/18 1625 02/08/18 1918 02/08/18 2329 02/09/18 0336 02/09/18 0745  GLUCAP 139* 138* 114* 128* 154* 130*   Imaging Dg Chest Port 1 View  Result Date: 02/09/2018 CLINICAL DATA:  Endotracheal tube position EXAM: PORTABLE CHEST 1 VIEW COMPARISON:  02/08/2018 FINDINGS: Endotracheal tube in good position. Gastric tube enters the stomach. Left arm PICC tip in the SVC parent Moderate left lower lobe airspace  disease and small left effusion unchanged. Progression of right lower lobe airspace disease. No effusion on the right IMPRESSION: Endotracheal tube in good position Progressive right lower lobe infiltrate/atelectasis Left lower lobe consolidation unchanged. Electronically Signed   By: Marlan Palau M.D.   On: 02/09/2018 07:58   STUDIES:  CT head 3/2 > Negative CTA for large vessel occlusion, Small volume acute ischemic left PICA territory infarct. 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 While subocclusive thrombus would be difficult to exclude, this is felt to be less likely. Query an episode of recent hypotension. Advanced atherosclerotic change affecting the  cavernous ICAs bilaterally, left greater than right, slightly progressed from previous. Short-segment 50% proximal left M1 stenosis. CT head 3/4 > new patchy hypodensities in the right corona radiata and right caudate nucleus consistent with acute infarcts. CT head 3/13 > Increasing or new regions of hypoattenuation scattered throughout right MCA distribution white matter, involving body and splenium of corpus callosum, and in the left lateral cerebellar hemisphere adjacent to prior PICA infarction likely representing interval areas of acute infarction. CTA head 3/14 >No emergent large vessel occlusion.  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  Approximately 50% stenosis of the distal right common carotid artery, unchanged. EEG 3/14 > diffuse slowing, focal slowing in Rt hemisphere compared to Lt CT 3/17 with extension of MCA CVA  CULTURES: Blood 3/13 > Aerococcus Urine 3/13>>> Tracheal aspirate 3/13 >  ANTIBIOTICS: Cefepime 3/13 > Vancomycin 3/13 >  SIGNIFICANT EVENTS: 3/1 admit for CABG 3/2 lethargy, new stroke identified 3/13 worsening lethargy after about a weak of slow progression.  3/14 intubated for airway protection.   LINES/TUBES: PICC 3/2>>> Cortrak 3/12>>> ETT 3/14>>> Foley 3/14>>>  DISCUSSION: 48 year old male who experienced an ischemic stroke after coronary artery bypass grafting.  Now with altered mental status making it unable for him to protect his airway.  Neurologic situation remains very poor.  ASSESSMENT / PLAN:  PULMONARY A: Acute respiratory failure with hypoxemia Obstructive sleep apnea P:   Begin PS trials but no extubation given mental status VAP prevention Titrate O2 for sat of 88-92% If no improvement by AM will call family in for a meeting on Thursday to proceed with trach on Friday.  CARDIOVASCULAR A:  CAD, s/p CABG Septic shock P:   Tele Continue levophed, dopamine per TCTS, titrate neo to off as able CVP noted  RENAL A:   Hypernatremia P:   Increase free water to 400 ml q4 Replace electrolytes as indicated BMET in AM Begin low dose lasix  GASTROINTESTINAL A:   No acute issue P:   Continue tube feeding  HEMATOLOGIC A:   Mild anemia no bleeding P:  Monitor for bleeding Transfuse per ICU protocol  INFECTIOUS A:   Fever Healthcare associated pneumonia Likely left maxillary sinusitis ?neuro fever  P:   Continue saline sprays Continue Vanco and Cefepime Follow-up cultures  ENDOCRINE A:   Hyperglycemia, worse on D5 infusion  P:   Increase Levemir CBG ISS  NEUROLOGIC A:   Ischemic stroke, posterior MCA distribution on right Encephalopathy, acute due to stroke and toxic metabolic P:   RASS goal: N/A Daily wakeup assessment Follow-up neurology recommendations No sedation  FAMILY  - Updates: No family bedside 3/19  - Inter-disciplinary family meet or Palliative Care meeting due by:  day 7  The patient is critically ill with multiple  organ systems failure and requires high complexity decision making for assessment and support, frequent evaluation and titration of therapies, application of advanced monitoring technologies and extensive interpretation of multiple databases.   Critical Care Time devoted to patient care services described in this note is  35  Minutes. This time reflects time of care of this signee Dr Koren Bound. This critical care time does not reflect procedure time, or teaching time or supervisory time of PA/NP/Med student/Med Resident etc but could involve care discussion time.  Alyson Reedy, M.D. [facility] Pulmonary/Critical Care Medicine. Pager: [phone]. After hours pager: [phone].  02/09/2018, 9:00 AM

## 2018-02-10 ENCOUNTER — Inpatient Hospital Stay (HOSPITAL_COMMUNITY): Payer: BLUE CROSS/BLUE SHIELD

## 2018-02-10 DIAGNOSIS — I10 Essential (primary) hypertension: Secondary | ICD-10-CM

## 2018-02-10 LAB — BPAM RBC
Blood Product Expiration Date: 201903262359
Blood Product Expiration Date: 201903272359
Blood Product Expiration Date: 201903282359
ISSUE DATE / TIME: 201903110630
ISSUE DATE / TIME: 201903160555
UNIT TYPE AND RH: 6200
UNIT TYPE AND RH: 6200
Unit Type and Rh: 6200

## 2018-02-10 LAB — TYPE AND SCREEN
ABO/RH(D): A POS
ANTIBODY SCREEN: NEGATIVE
UNIT DIVISION: 0
Unit division: 0
Unit division: 0

## 2018-02-10 LAB — BASIC METABOLIC PANEL
ANION GAP: 6 (ref 5–15)
BUN: 34 mg/dL — ABNORMAL HIGH (ref 6–20)
CALCIUM: 7.9 mg/dL — AB (ref 8.9–10.3)
CO2: 27 mmol/L (ref 22–32)
Chloride: 113 mmol/L — ABNORMAL HIGH (ref 101–111)
Creatinine, Ser: 1.07 mg/dL (ref 0.61–1.24)
GFR calc non Af Amer: >60 mL/min (ref >60)
Glucose, Bld: 211 mg/dL — ABNORMAL HIGH (ref 65–99)
POTASSIUM: 3.7 mmol/L (ref 3.5–5.1)
Sodium: 146 mmol/L — ABNORMAL HIGH (ref 135–145)

## 2018-02-10 LAB — CBC
HCT: 26.2 % — ABNORMAL LOW (ref 39.0–52.0)
Hemoglobin: 7.6 g/dL — ABNORMAL LOW (ref 13.0–17.0)
MCH: 28.9 pg (ref 26.0–34.0)
MCHC: 29 g/dL — ABNORMAL LOW (ref 30.0–36.0)
MCV: 99.6 fL (ref 78.0–100.0)
PLATELETS: 291 10*3/uL (ref 150–400)
RBC: 2.63 MIL/uL — AB (ref 4.22–5.81)
RDW: 14.2 % (ref 11.5–15.5)
WBC: 15.3 10*3/uL — AB (ref 4.0–10.5)

## 2018-02-10 LAB — GLUCOSE, CAPILLARY
GLUCOSE-CAPILLARY: 134 mg/dL — AB (ref 65–99)
GLUCOSE-CAPILLARY: 119 mg/dL — AB (ref 65–99)
GLUCOSE-CAPILLARY: 132 mg/dL — AB (ref 65–99)
Glucose-Capillary: 132 mg/dL — ABNORMAL HIGH (ref 65–99)
Glucose-Capillary: 140 mg/dL — ABNORMAL HIGH (ref 65–99)
Glucose-Capillary: 148 mg/dL — ABNORMAL HIGH (ref 65–99)

## 2018-02-10 LAB — MAGNESIUM: MAGNESIUM: 2.2 mg/dL (ref 1.7–2.4)

## 2018-02-10 LAB — PHOSPHORUS: PHOSPHORUS: 4.4 mg/dL (ref 2.5–4.6)

## 2018-02-10 LAB — PROCALCITONIN: PROCALCITONIN: 0.14 ng/mL

## 2018-02-10 MED ORDER — FUROSEMIDE 10 MG/ML IJ SOLN
40.0000 mg | Freq: Once | INTRAMUSCULAR | Status: AC
  Administered 2018-02-10: 40 mg via INTRAVENOUS
  Filled 2018-02-10: qty 4

## 2018-02-10 MED ORDER — FREE WATER
200.0000 mL | Status: DC
  Administered 2018-02-10 – 2018-02-14 (×24): 200 mL

## 2018-02-10 NOTE — Progress Notes (Addendum)
PULMONARY / CRITICAL CARE MEDICINE   Name: Nicholas Mejia MRN: [id number] DOB: 1971/09/26    ADMISSION DATE:  02/11/2018 CONSULTATION DATE: March 14   REFERRING MD:  Dr. Dorris FetchHendrickson  CHIEF COMPLAINT: Confusion after coronary artery bypass grafting  HISTORY OF PRESENT ILLNESS:   47 year old male with a past medical history significant for coronary artery disease who has an LVEF of 40% underwent a coronary artery bypass graft on January 22, 2018.  Afterwards was noted to have altered mental status with a new right sided ischemic stroke.  Ultimately required intubation on March 13.  SUBJECTIVE:  No change in poor mental status.  No gag.  Weans on PS 10/5.   Fever improved.  Diuresing.   VITAL SIGNS: BP (!) 164/68   Pulse 96   Temp 99 F (37.2 C)   Resp (!) 22   Ht 6' 2.02" (1.88 m)   Wt (!) 151.6 kg (334 lb 3.5 oz)   SpO2 100%   BMI 42.89 kg/m   HEMODYNAMICS:    VENTILATOR SETTINGS: Vent Mode: PRVC FiO2 (%):  [30 %] 30 % Set Rate:  [14 bmp] 14 bmp Vt Set:  [660 mL] 660 mL PEEP:  [5 cmH20] 5 cmH20 Pressure Support:  [10 cmH20] 10 cmH20 Plateau Pressure:  [22 cmH20-25 cmH20] 25 cmH20  INTAKE / OUTPUT: I/O last 3 completed shifts: In: 4414.4 [I.V.:2074.4; NG/GT:1640; IV Piggyback:700] Out: 6260 [Urine:6260]  PHYSICAL EXAMINATION:  General:  Unresponsive, NAD HENT: [place]/AT, PERRL, EOM-spontaneous and MMM PULM: resps even non labored on vent, coarse  CV: RRR, Nl S1/S2 and -M/R/G. GI: Soft, NT, ND and +BS MSK: warm and dry, anasarca  Neuro: no response, no sedation, no gag  Skin: intact  LABS:  BMET Recent Labs  Lab 02/08/18 0434 02/09/18 0337 02/10/18 0352  NA 152* 152* 146*  K 3.3* 4.3 3.7  CL 121* 119* 113*  CO2 24 24 27   BUN 43* 44* 34*  CREATININE 1.26* 1.23 1.07  GLUCOSE 257* 233* 211*   Electrolytes Recent Labs  Lab 02/07/18 0441 02/08/18 0434 02/09/18 0337 02/10/18 0352  CALCIUM 7.7* 6.9* 7.5* 7.9*  MG 3.0*  --  2.8* 2.2  PHOS 3.8  --  4.2 4.4    CBC Recent Labs  Lab 02/08/18 0629 02/08/18 0939 02/09/18 0337 02/10/18 0352  WBC 14.2*  --  11.6* 15.3*  HGB 6.3* 8.2* 7.3* 7.6*  HCT 21.6* 28.4* 24.8* 26.2*  PLT 293  --  263 291   Coag's No results for input(s): APTT, INR in the last 168 hours.  Sepsis Markers No results for input(s): LATICACIDVEN, PROCALCITON, O2SATVEN in the last 168 hours.  ABG Recent Labs  Lab 02/04/18 0152 02/05/18 0335 02/09/18 0535  PHART 7.375 7.427 7.378  PCO2ART 56.7* 46.4 45.5  PO2ART 118.0* 91.8 81.5*   Liver Enzymes Recent Labs  Lab 02/04/18 0306  AST 161*  ALT 73*  ALKPHOS 49  BILITOT 1.4*  ALBUMIN 2.1*   Cardiac Enzymes No results for input(s): TROPONINI, PROBNP in the last 168 hours.  Glucose Recent Labs  Lab 02/09/18 1141 02/09/18 1537 02/09/18 1919 02/09/18 2339 02/10/18 0339 02/10/18 0735  GLUCAP 167* 118* 110* 148* 134* 119*   Imaging Dg Chest Port 1 View  Result Date: 02/10/2018 CLINICAL DATA:  Status post CABG on January 22, 2018. EXAM: PORTABLE CHEST 1 VIEW COMPARISON:  Portable chest x-ray of March 19th and March 18th 2019 FINDINGS: The lungs are mildly hypoinflated. There is persistent increased density in the left retrocardiac region  with obscuration of the left hemidiaphragm. The interstitial markings elsewhere both lungs are coarse but slightly less conspicuous today. The cardiac silhouette is enlarged. The pulmonary vascularity is less engorged and more distinct. The esophagogastric tube tip in proximal port project below the expected location of the GE junction. The endotracheal tube tip lies approximately 6.4 cm above the carina at the inferior margin of the clavicular heads. The left-sided PICC line tip projects over the midportion of the SVC. IMPRESSION: Persistent left lower lobe atelectasis or pneumonia with small left pleural effusion. Mild improvement in the appearance of the pulmonary interstitium bilaterally with decreased pulmonary vascular congestion  consistent with improving CHF. The support tubes are in reasonable position. Electronically Signed   By: David  [place] M.D.   On: 02/10/2018 09:00   STUDIES:  CT head 3/2 > Negative CTA for large vessel occlusion, Small volume acute ischemic left PICA territory infarct. 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 While subocclusive thrombus would be difficult to exclude, this is felt to be less likely. Query an episode of recent hypotension. Advanced atherosclerotic change affecting the cavernous ICAs bilaterally, left greater than right, slightly progressed from previous. Short-segment 50% proximal left M1 stenosis. CT head 3/4 > new patchy hypodensities in the right corona radiata and right caudate nucleus consistent with acute infarcts. CT head 3/13 > Increasing or new regions of hypoattenuation scattered throughout right MCA distribution white matter, involving body and splenium of corpus callosum, and in the left lateral cerebellar hemisphere adjacent to prior PICA infarction likely representing interval areas of acute infarction. CTA head 3/14 >No emergent large vessel occlusion.  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  Approximately 50% stenosis of the distal right common carotid artery, unchanged. EEG 3/14 > diffuse slowing, focal slowing in Rt hemisphere compared to Lt CT 3/17 with extension of MCA CVA  CULTURES: Blood 3/13 > Aerococcus Urine 3/13>>>neg  Tracheal aspirate 3/13 >normal flora   ANTIBIOTICS: Cefepime 3/13 > Vancomycin 3/13 >  SIGNIFICANT EVENTS: 3/1 admit for CABG 3/2 lethargy, new stroke identified 3/13 worsening lethargy after about a weak of slow progression.  3/14 intubated for airway protection.   LINES/TUBES: LUA PICC 3/2>>> Cortrak 3/12>>> ETT 3/14>>> Foley 3/14>>>  DISCUSSION: 47 year old male who  experienced an ischemic stroke after coronary artery bypass grafting.  Now with altered mental status making it unable for him to protect his airway.  Neurologic situation remains very poor.  ASSESSMENT / PLAN:  PULMONARY A: Acute respiratory failure with hypoxemia Obstructive sleep apnea P:   Vent support - 8cc/kg  F/u CXR  F/u ABG Ok for PS wean as tol but no extubation given mental status VAP prevention Titrate O2 for sat of 88-92% Needs trach if family wishes to continue aggressive care    CARDIOVASCULAR A:  CAD, s/p CABG Septic shock P:  Tele Wean pressors to off as able  Dopamine per CVTS  Monitor CVP q shift  Repeat lasix x 1 today   RENAL A:   Hypernatremia P:   Continue free water - decrease to 200 q4h  Replace electrolytes as indicated BMET in AM Repeat lasix x 1 today   GASTROINTESTINAL A:   No acute issue P:   Continue tube feeding  HEMATOLOGIC A:   Mild anemia no bleeding P:  Monitor for bleeding Transfuse per ICU protocol  INFECTIOUS A:  Fever Healthcare associated pneumonia Likely left maxillary sinusitis ?neuro fever Leukocytosis  P:   Continue saline sprays Continue Vanco and Cefepime Check pct - cultures all neg to date but WBC creeping up.  ?central fever.   ENDOCRINE A:   Hyperglycemia  P:   Levemir CBG SSI   NEUROLOGIC A:   Ischemic stroke, posterior MCA distribution on right Encephalopathy, acute due to stroke and toxic metabolic P:   RASS goal: N/A Daily wakeup assessment Follow-up neurology recommendations Remains off all sedation  FAMILY  - Updates: No family bedside 3/20  - Inter-disciplinary family meet or Palliative Care meeting due by:  day 7  Need to further discuss goals of care with family.  If they wish to continue aggressive care will need trach in next few days and potentially PEG as well.   Dirk Dress, NP 02/10/2018  9:03 AM Pager: [phone] or [phone]  Attending  Note:  47 year old male s/p CABG and 2 CVAs that remains completely unresponsive.  On exam, weaning but given mental status can not extubate.  I reviewed CXR myself, ETT is in good position.  Discussed with PCCM-NP.  Patient is simply not extubatable at this time.  Family is coming in this afternoon to discuss plan of care.  I will inform them of inability to extubate given mental status and see if they wish for trach/peg vs terminal extubation.  Will contact Dr. Dorris Fetch.  The patient is critically ill with multiple organ systems failure and requires high complexity decision making for assessment and support, frequent evaluation and titration of therapies, application of advanced monitoring technologies and extensive interpretation of multiple databases.   Critical Care Time devoted to patient care services described in this note is  35  Minutes. This time reflects time of care of this signee Dr Koren Bound. This critical care time does not reflect procedure time, or teaching time or supervisory time of PA/NP/Med student/Med Resident etc but could involve care discussion time.  Alyson Reedy, M.D. [facility] Pulmonary/Critical Care Medicine. Pager: [phone]. After hours pager: [phone].

## 2018-02-10 NOTE — Progress Notes (Signed)
19 Days Post-Op Procedure(s) (LRB): CORONARY ARTERY BYPASS GRAFTING (CABG) TIMES Four,  USING LEFT INTERNAL MAMMARY ARTERY, RIGHT RADIAL ARTERY AND RIGHT SAPHENOUS LEG VEIN HARVESTED ENDOSCOPICALLY (N/A) RADIAL ARTERY HARVEST (Right) TRANSESOPHAGEAL ECHOCARDIOGRAM (TEE) (N/A) Subjective: Intubated, unresponsive  Objective: Vital signs in last 24 hours: Temp:  [98.2 F (36.8 C)-101.1 F (38.4 C)] 98.6 F (37 C) (03/20 0800) Pulse Rate:  [80-98] 98 (03/20 0800) Cardiac Rhythm: Normal sinus rhythm (03/20 0430) Resp:  [0-27] 20 (03/20 0800) BP: (106-164)/(38-75) 142/67 (03/20 0800) SpO2:  [97 %-100 %] 100 % (03/20 0800) FiO2 (%):  [30 %] 30 % (03/20 0430) Weight:  [334 lb 3.5 oz (151.6 kg)] 334 lb 3.5 oz (151.6 kg) (03/20 0615)  Hemodynamic parameters for last 24 hours:    Intake/Output from previous day: 03/19 0701 - 03/20 0700 In: 2781 [I.V.:1081; NG/GT:1200; IV Piggyback:500] Out: 5035 [Urine:5035] Intake/Output this shift: Total I/O In: 396.4 [I.V.:16.4; NG/GT:180; IV Piggyback:200] Out: 250 [Urine:250]  General appearance: some response to suctioning Neurologic: see above Heart: regular rate and rhythm Lungs: coarse BS bilaterally no wheezing Abdomen: normal findings: soft, non-tender Wound: clean and dry  Lab Results: Recent Labs    02/09/18 0337 02/10/18 0352  WBC 11.6* 15.3*  HGB 7.3* 7.6*  HCT 24.8* 26.2*  PLT 263 291   BMET:  Recent Labs    02/09/18 0337 02/10/18 0352  NA 152* 146*  K 4.3 3.7  CL 119* 113*  CO2 24 27  GLUCOSE 233* 211*  BUN 44* 34*  CREATININE 1.23 1.07  CALCIUM 7.5* 7.9*    PT/INR: No results for input(s): LABPROT, INR in the last 72 hours. ABG    Component Value Date/Time   PHART 7.378 02/09/2018 0535   HCO3 25.9 02/09/2018 0535   TCO2 34 (H) 02/04/2018 0152   ACIDBASEDEF 3.0 (H) 01/23/2018 0846   O2SAT 95.1 02/09/2018 0535   CBG (last 3)  Recent Labs    02/09/18 2339 02/10/18 0339 02/10/18 0735  GLUCAP 148* 134*  119*    Assessment/Plan: S/P Procedure(s) (LRB): CORONARY ARTERY BYPASS GRAFTING (CABG) TIMES Four,  USING LEFT INTERNAL MAMMARY ARTERY, RIGHT RADIAL ARTERY AND RIGHT SAPHENOUS LEG VEIN HARVESTED ENDOSCOPICALLY (N/A) RADIAL ARTERY HARVEST (Right) TRANSESOPHAGEAL ECHOCARDIOGRAM (TEE) (N/A) -NEURO- remains unresponsive to voice, touch  Right CVA with edema  CV- in SR. Weaning off neo, will wean norepi next  RESP- VDRF- has been weaning during day but not able to consider extubation due to mental status  ID- fevers down, but WBC up a little this AM  Continue Vanco, cefepime  GI/Nutrition- tolerating goal TF  RENAL- diuresed well yesterday and hypernatremia improved as well  Decrease free water  ENDO- CBG better controlled  Prognosis remains poor. Will need trach/PEG   LOS: 19 days    Loreli SlotSteven C Hendrickson 02/10/2018

## 2018-02-10 NOTE — Progress Notes (Signed)
ABG cancelled.  Difficult to draw.  Patient remains stable on vent, no settings changed.

## 2018-02-10 NOTE — Progress Notes (Signed)
CT surgery p.m. Rounds  Patient did some pressure support weaning today, remains intubated Pressors have been weaned No change in neuro status

## 2018-02-10 NOTE — Progress Notes (Signed)
CSW continuing to follow for placement needs when more stable- currently intubated with possible plan for trach Friday  CSW will reevalute for SNF needs based on patient progress and ability to wean from vent after trach placed  Burna SisJenna H. Breland Trouten, LCSW Clinical Social Worker [phone]

## 2018-02-11 DIAGNOSIS — Z7189 Other specified counseling: Secondary | ICD-10-CM

## 2018-02-11 DIAGNOSIS — Z515 Encounter for palliative care: Secondary | ICD-10-CM

## 2018-02-11 LAB — GLUCOSE, CAPILLARY
GLUCOSE-CAPILLARY: 111 mg/dL — AB (ref 65–99)
Glucose-Capillary: 112 mg/dL — ABNORMAL HIGH (ref 65–99)
Glucose-Capillary: 119 mg/dL — ABNORMAL HIGH (ref 65–99)
Glucose-Capillary: 139 mg/dL — ABNORMAL HIGH (ref 65–99)
Glucose-Capillary: 145 mg/dL — ABNORMAL HIGH (ref 65–99)
Glucose-Capillary: 96 mg/dL (ref 65–99)

## 2018-02-11 LAB — BASIC METABOLIC PANEL
ANION GAP: 8 (ref 5–15)
BUN: 34 mg/dL — ABNORMAL HIGH (ref 6–20)
CO2: 28 mmol/L (ref 22–32)
Calcium: 8 mg/dL — ABNORMAL LOW (ref 8.9–10.3)
Chloride: 108 mmol/L (ref 101–111)
Creatinine, Ser: 0.95 mg/dL (ref 0.61–1.24)
GFR calc Af Amer: >60 mL/min (ref >60)
Glucose, Bld: 181 mg/dL — ABNORMAL HIGH (ref 65–99)
POTASSIUM: 3.8 mmol/L (ref 3.5–5.1)
SODIUM: 144 mmol/L (ref 135–145)

## 2018-02-11 LAB — CBC
HEMATOCRIT: 24.1 % — AB (ref 39.0–52.0)
HEMOGLOBIN: 7.3 g/dL — AB (ref 13.0–17.0)
MCH: 29.6 pg (ref 26.0–34.0)
MCHC: 30.3 g/dL (ref 30.0–36.0)
MCV: 97.6 fL (ref 78.0–100.0)
Platelets: 273 10*3/uL (ref 150–400)
RBC: 2.47 MIL/uL — ABNORMAL LOW (ref 4.22–5.81)
RDW: 14.3 % (ref 11.5–15.5)
WBC: 15.4 10*3/uL — ABNORMAL HIGH (ref 4.0–10.5)

## 2018-02-11 LAB — PROCALCITONIN: PROCALCITONIN: 0.15 ng/mL

## 2018-02-11 NOTE — Progress Notes (Signed)
20 Days Post-Op Procedure(s) (LRB): CORONARY ARTERY BYPASS GRAFTING (CABG) TIMES Four,  USING LEFT INTERNAL MAMMARY ARTERY, RIGHT RADIAL ARTERY AND RIGHT SAPHENOUS LEG VEIN HARVESTED ENDOSCOPICALLY (N/A) RADIAL ARTERY HARVEST (Right) TRANSESOPHAGEAL ECHOCARDIOGRAM (TEE) (N/A) Subjective: Intubated, unresponsive  Objective: Vital signs in last 24 hours: Temp:  [98.1 F (36.7 C)-99.9 F (37.7 C)] 99.1 F (37.3 C) (03/21 0736) Pulse Rate:  [90-113] 96 (03/21 0737) Cardiac Rhythm: Normal sinus rhythm (03/21 0400) Resp:  [10-30] 24 (03/21 0737) BP: (87-172)/(38-122) 102/55 (03/21 0737) SpO2:  [96 %-100 %] 100 % (03/21 0737) FiO2 (%):  [30 %] 30 % (03/21 0737) Weight:  [340 lb 2.7 oz (154.3 kg)] 340 lb 2.7 oz (154.3 kg) (03/21 0500)  Hemodynamic parameters for last 24 hours: CVP:  [6 mmHg-8 mmHg] 8 mmHg  Intake/Output from previous day: 03/20 0701 - 03/21 0700 In: 2751.3 [I.V.:271.3; NG/GT:1480; IV Piggyback:1000] Out: 2815 [Urine:2815] Intake/Output this shift: No intake/output data recorded.  Neurologic: unchanged Heart: regular rate and rhythm Lungs: diminished breath sounds bibasilar Abdomen: normal findings: soft, non-tender  Lab Results: Recent Labs    02/10/18 0352 02/11/18 0359  WBC 15.3* 15.4*  HGB 7.6* 7.3*  HCT 26.2* 24.1*  PLT 291 273   BMET:  Recent Labs    02/10/18 0352 02/11/18 0359  NA 146* 144  K 3.7 3.8  CL 113* 108  CO2 27 28  GLUCOSE 211* 181*  BUN 34* 34*  CREATININE 1.07 0.95  CALCIUM 7.9* 8.0*    PT/INR: No results for input(s): LABPROT, INR in the last 72 hours. ABG    Component Value Date/Time   PHART 7.378 02/09/2018 0535   HCO3 25.9 02/09/2018 0535   TCO2 34 (H) 02/04/2018 0152   ACIDBASEDEF 3.0 (H) 01/23/2018 0846   O2SAT 95.1 02/09/2018 0535   CBG (last 3)  Recent Labs    02/10/18 2356 02/11/18 0356 02/11/18 0734  GLUCAP 145* 139* 111*    Assessment/Plan: S/P Procedure(s) (LRB): CORONARY ARTERY BYPASS GRAFTING  (CABG) TIMES Four,  USING LEFT INTERNAL MAMMARY ARTERY, RIGHT RADIAL ARTERY AND RIGHT SAPHENOUS LEG VEIN HARVESTED ENDOSCOPICALLY (N/A) RADIAL ARTERY HARVEST (Right) TRANSESOPHAGEAL ECHOCARDIOGRAM (TEE) (N/A) -  Remains unresponsive. Neuro prognosis is poor CV stable- off neo and levophed- wean dopamine as toelrated RESP- able to wean but not able to extubate due to mental status ID- day 10 of antibiotics- dc after today's doses RENAL- creatinine back to normal  Hypernatremia resolved Family met with Dr. Nelda Marseille yesterday - Mother is considering extubation vs trach/PEG   LOS: 20 days    Nicholas Mejia 02/11/2018

## 2018-02-11 NOTE — Progress Notes (Signed)
Nutrition Follow-up  DOCUMENTATION CODES:   Morbid obesity  INTERVENTION:   Jevity 1.2 @ 40 mL/hr via Cortrak 60 mlprostat QID  Provides 1952 kcal, 173 g protein, and 774 mL free water. Meets 100% of protein and calorie needs.  NUTRITION DIAGNOSIS:   Inadequate oral intake related to inability to eat as evidenced by NPO status. Ongoing.   GOAL:   Patient will meet greater than or equal to 90% of their needs Met.   MONITOR:   Diet advancement, Skin, Weight trends, I & O's, Labs  ASSESSMENT:   47 y.o.malewith PMH of CAD, DM, and HTN. S/P CABG x4 and perioperative CVA.  3/1 CABG x4; pt extubated 3/4 Cortrak, gastric placement 3/11 Pt's cortrak replaced, this is patient's 4th cortrak tube 3/12- FEEs, SLP recommends NPO as pt is severe aspiration risk 3/13- re-intubated for unresponsiveness  Per MD pt with poor prognosis due to mental status, family meeting planned to determine trach/PEG.  Patient is currently intubated on ventilator support  Medications reviewed and include: dulcolax, colace, SSI, levemir, off all pressors Labs reviewed CBG's: 139-111-112 200 ml free water every 4 hours = 1200 ml   Pt is positive 9 L since admission  Diet Order:  Fall precautions Diet NPO time specified  EDUCATION NEEDS:   Not appropriate for education at this time  Skin:  Skin Assessment: Skin Integrity Issues: Skin Integrity Issues:: DTI DTI: buttocks Incisions: R arm, R leg, chest  Last BM:  3/9 medium  Height:   Ht Readings from Last 1 Encounters:  02/01/18 6' 2.02" (1.88 m)    Weight:   Wt Readings from Last 1 Encounters:  02/11/18 (!) 340 lb 2.7 oz (154.3 kg)    Ideal Body Weight:  86.36 kg  BMI:  Body mass index is 43.66 kg/m.  Estimated Nutritional Needs:   Kcal:  9144-4584 kcal/day  Protein:  >172 g/day  Fluid:  1.5 L/day  Maylon Peppers RD, LDN, CNSC [phone] Pager [phone] After Hours Pager

## 2018-02-11 NOTE — Progress Notes (Addendum)
PULMONARY / CRITICAL CARE MEDICINE   Name: Nicholas Mejia MRN: [id number] DOB: 12/27/1970    ADMISSION DATE:  02/18/2018 CONSULTATION DATE: March 14   REFERRING MD:  Dr. Dorris Fetch  CHIEF COMPLAINT: Confusion after coronary artery bypass grafting  HISTORY OF PRESENT ILLNESS:   47 year old male with a past medical history significant for coronary artery disease who has an LVEF of 40% underwent a coronary artery bypass graft on January 22, 2018.  Afterwards was noted to have altered mental status with a new right sided ischemic stroke.  Ultimately required intubation on March 13.  SUBJECTIVE:  Remains unresponsive.  NO sedation.  Off pressors. Weaning PS 10/5   VITAL SIGNS: BP (!) 95/49   Pulse 91   Temp 99 F (37.2 C)   Resp 20   Ht 6' 2.02" (1.88 m)   Wt (!) 154.3 kg (340 lb 2.7 oz)   SpO2 99%   BMI 43.66 kg/m   HEMODYNAMICS: CVP:  [6 mmHg-8 mmHg] 7 mmHg  VENTILATOR SETTINGS: Vent Mode: CPAP;PSV FiO2 (%):  [30 %] 30 % Vt Set:  [409 mL] 660 mL PEEP:  [5 cmH20] 5 cmH20 Pressure Support:  [10 cmH20] 10 cmH20 Plateau Pressure:  [12 cmH20-15 cmH20] 12 cmH20  INTAKE / OUTPUT: I/O last 3 completed shifts: In: 3600 [I.V.:540; NG/GT:1960; IV Piggyback:1100] Out: 4675 [Urine:4675]  PHYSICAL EXAMINATION:  General:  Unresponsive, NAD HENT: /AT, PERRL, EOM-spontaneous and MMM PULM: resps even non labored on vent, few scattered rhonchi  CV: RRR, Nl S1/S2 and -M/R/G. GI: Soft, NT, ND and +BS MSK: warm and dry, anasarca  Neuro: no response, no sedation, no gag  Skin: intact  LABS:  BMET Recent Labs  Lab 02/09/18 0337 02/10/18 0352 02/11/18 0359  NA 152* 146* 144  K 4.3 3.7 3.8  CL 119* 113* 108  CO2 24 27 28   BUN 44* 34* 34*  CREATININE 1.23 1.07 0.95  GLUCOSE 233* 211* 181*   Electrolytes Recent Labs  Lab 02/07/18 0441  02/09/18 0337 02/10/18 0352 02/11/18 0359  CALCIUM 7.7*   < > 7.5* 7.9* 8.0*  MG 3.0*  --  2.8* 2.2  --   PHOS 3.8  --  4.2 4.4  --    <  > = values in this interval not displayed.   CBC Recent Labs  Lab 02/09/18 0337 02/10/18 0352 02/11/18 0359  WBC 11.6* 15.3* 15.4*  HGB 7.3* 7.6* 7.3*  HCT 24.8* 26.2* 24.1*  PLT 263 291 273   Coag's No results for input(s): APTT, INR in the last 168 hours.  Sepsis Markers Recent Labs  Lab 02/10/18 0904 02/11/18 0359  PROCALCITON 0.14 0.15    ABG Recent Labs  Lab 02/05/18 0335 02/09/18 0535  PHART 7.427 7.378  PCO2ART 46.4 45.5  PO2ART 91.8 81.5*   Liver Enzymes No results for input(s): AST, ALT, ALKPHOS, BILITOT, ALBUMIN in the last 168 hours. Cardiac Enzymes No results for input(s): TROPONINI, PROBNP in the last 168 hours.  Glucose Recent Labs  Lab 02/10/18 1131 02/10/18 1519 02/10/18 1924 02/10/18 2356 02/11/18 0356 02/11/18 0734  GLUCAP 132* 132* 140* 145* 139* 111*   Imaging No results found. STUDIES:  CT head 3/2 > Negative CTA for large vessel occlusion, Small volume acute ischemic left PICA territory infarct. 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 While subocclusive thrombus would be difficult to exclude, this is felt to be less likely. Query an episode of recent hypotension. Advanced atherosclerotic change affecting  the cavernous ICAs bilaterally, left greater than right, slightly progressed from previous. Short-segment 50% proximal left M1 stenosis. CT head 3/4 > new patchy hypodensities in the right corona radiata and right caudate nucleus consistent with acute infarcts. CT head 3/13 > Increasing or new regions of hypoattenuation scattered throughout right MCA distribution white matter, involving body and splenium of corpus callosum, and in the left lateral cerebellar hemisphere adjacent to prior PICA infarction likely representing interval areas of acute infarction. CTA head 3/14 >No emergent large vessel occlusion.  Unchanged severe stenosis of the mid M1 segment of the right middle cerebral  artery and both internal carotid arteries at the skull base.  Approximately 50% stenosis of the distal right common carotid artery, unchanged. EEG 3/14 > diffuse slowing, focal slowing in Rt hemisphere compared to Lt CT 3/17 with extension of MCA CVA  CULTURES: Blood 3/13 > Aerococcus Urine 3/13>>>neg  Tracheal aspirate 3/13 >normal flora   ANTIBIOTICS: Cefepime 3/13 > Vancomycin 3/13 >  SIGNIFICANT EVENTS: 3/1 admit for CABG 3/2 lethargy, new stroke identified 3/13 worsening lethargy after about a weak of slow progression.  3/14 intubated for airway protection.   LINES/TUBES: LUA PICC 3/2>>> Cortrak 3/12>>> ETT 3/14>>> Foley 3/14>>>  DISCUSSION: 47 year old male who experienced an ischemic stroke after coronary artery bypass grafting.  Now with altered mental status making it unable for him to protect his airway.  Neurologic situation remains very poor.  ASSESSMENT / PLAN:  PULMONARY A: Acute respiratory failure with hypoxemia Obstructive sleep apnea P:   Vent support - 8cc/kg  F/u CXR  F/u ABG Ok for PS wean as tol but no extubation given mental status VAP prevention Titrate O2 for sat of 88-92% Needs trach if family wishes to continue aggressive care - see below    CARDIOVASCULAR A:  CAD, s/p CABG Septic shock P:  Tele Pressors off  Dopamine per CVTS  Monitor CVP q shift  Repeat lasix when able - hold for now with soft BP   RENAL A:   Hypernatremia P:   Continue free water - decrease to 200 q4h  Replace electrolytes as indicated BMET in AM   GASTROINTESTINAL A:   No acute issue P:   Continue tube feeding  HEMATOLOGIC A:   Mild anemia no bleeding P:  Monitor for bleeding Transfuse per ICU protocol  INFECTIOUS A:   Fever [facility] associated pneumonia Likely left maxillary sinusitis ?neuro fever - pct neg.  Leukocytosis  P:   Continue saline sprays D/c Vanco and Cefepime - D8 abx, cultures neg, pct neg  Follow cultures    ENDOCRINE A:   Hyperglycemia  P:   Levemir CBG SSI   NEUROLOGIC A:   Ischemic stroke, posterior MCA distribution on right Encephalopathy, acute due to stroke and toxic metabolic P:   RASS goal: N/A Daily wakeup assessment Follow-up neurology recommendations Remains off all sedation  FAMILY  - Updates: brother updated 3/21  - Inter-disciplinary family meet or Palliative Care meeting due by:  day 7  Discussed with brother at length at bedside.  He feels that it is "too soon" to know if he is going to get better.  He feels that pt would want trach/peg/prolonged vent support to give him "more time to wake up".  However pts mom seemed to feel the opposite yesterday in discussions with dr. Molli Knock.  Will try to get family all together and discuss further today.  Would NOT recommend trach/peg/prolonged support given the severity of his neurologic injury and poor  overall prognosis.    Dirk DressKaty Whiteheart, NP 02/11/2018  9:37 AM Pager: [phone] or [phone]  Attending Note:  47 year old male with PMH of CAD, s/p CABG and CVAx2 that remains completely unresponsive on the vent.  On exam, weaning but unable to extubate due to poor mental status.  I reviewed CXR myself, ETT is in good position.  Discussed with PCCM-NP and bedside RN.  Pressors are off at this point and renal function is back to normal.  Brother is bedside, spoke with him about plan of care and awaiting mother's return.  The brother feels that it is too soon to extubate terminally at this point and would like to pursue trach/peg.  Mother per RN feels that patient would not want that.  Continue weaning, when mother and the rest of the family arrives will have further indepth discussion.  PCCM will continue to follow.  The patient is critically ill with multiple organ systems failure and requires high complexity decision making for assessment and support, frequent evaluation and titration of therapies, application of  advanced monitoring technologies and extensive interpretation of multiple databases.   Critical Care Time devoted to patient care services described in this note is  35  Minutes. This time reflects time of care of this signee Dr Koren BoundWesam Braiden Presutti. This critical care time does not reflect procedure time, or teaching time or supervisory time of PA/NP/Med student/Med Resident etc but could involve care discussion time.  Alyson ReedyWesam G. Dayelin Balducci, M.D. [facility]. Pager: [phone]. After hours pager: [phone].

## 2018-02-11 NOTE — Progress Notes (Signed)
Extensive conversation with the family, the patient is clearly suffering from significant brain damage.  The family is understandably struggling with the trach/peg vs comfort/DNR decision.  The brother just arrived and is demanding an MRI.  I explained to him that if the damage is apparent on CT the MRI will add very little which is the reason the neurologist did not pursue it.  I explained that given how long the patient has been unresponsive and how long he has been off sedation with improvement of his renal and liver function this is all due to CVA and the patient is very unlikely to improve.  After a long discussion, brother was leaning towards trach/peg.  Mother essentially took over the conversation, informed us that the patient would not want this kind of life.  After further discussion, decision is made to make the patient a full DNR, no CPR/cardioversion or trach/peg.  Family will be gathering over the next few days and will proceed with comfort.  Will enter orders.  No need for morphine at this time.  The patient is critically ill with multiple organ systems failure and requires high complexity decision making for assessment and support, frequent evaluation and titration of therapies, application of advanced monitoring technologies and extensive interpretation of multiple databases.   Critical Care Time devoted to patient care services described in this note is  60  Minutes. This time reflects time of care of this signee Dr Koren BoundWesam Yacoub. This critical care time does not reflect procedure time, or teaching time or supervisory time of PA/NP/Med student/Med Resident etc but could involve care discussion time.  Alyson ReedyWesam G. Yacoub, M.D. [facility]. Pager: [phone]. After hours pager: [phone].

## 2018-02-11 NOTE — Progress Notes (Signed)
Patient ID: Nicholas Mejia, male   DOB: 08-16-1971, 47 y.o.   MRN: [id number] TCTS Evening Rounds:  Hemodynamically stable on dop 3  Remains on ventilator  Urine output ok  CCM has made him DNR after discussion with family.

## 2018-02-12 ENCOUNTER — Inpatient Hospital Stay (HOSPITAL_COMMUNITY): Payer: BLUE CROSS/BLUE SHIELD

## 2018-02-12 LAB — BASIC METABOLIC PANEL
ANION GAP: 10 (ref 5–15)
BUN: 42 mg/dL — ABNORMAL HIGH (ref 6–20)
CHLORIDE: 106 mmol/L (ref 101–111)
CO2: 27 mmol/L (ref 22–32)
CREATININE: 0.95 mg/dL (ref 0.61–1.24)
Calcium: 8.1 mg/dL — ABNORMAL LOW (ref 8.9–10.3)
GFR calc non Af Amer: >60 mL/min (ref >60)
Glucose, Bld: 142 mg/dL — ABNORMAL HIGH (ref 65–99)
POTASSIUM: 4.1 mmol/L (ref 3.5–5.1)
SODIUM: 143 mmol/L (ref 135–145)

## 2018-02-12 LAB — CBC
HEMATOCRIT: 22.8 % — AB (ref 39.0–52.0)
HEMOGLOBIN: 7 g/dL — AB (ref 13.0–17.0)
MCH: 30.2 pg (ref 26.0–34.0)
MCHC: 30.7 g/dL (ref 30.0–36.0)
MCV: 98.3 fL (ref 78.0–100.0)
PLATELETS: 277 10*3/uL (ref 150–400)
RBC: 2.32 MIL/uL — AB (ref 4.22–5.81)
RDW: 14.5 % (ref 11.5–15.5)
WBC: 12.6 10*3/uL — AB (ref 4.0–10.5)

## 2018-02-12 LAB — GLUCOSE, CAPILLARY
GLUCOSE-CAPILLARY: 114 mg/dL — AB (ref 65–99)
GLUCOSE-CAPILLARY: 89 mg/dL (ref 65–99)
Glucose-Capillary: 124 mg/dL — ABNORMAL HIGH (ref 65–99)
Glucose-Capillary: 90 mg/dL (ref 65–99)
Glucose-Capillary: 98 mg/dL (ref 65–99)

## 2018-02-12 LAB — PROCALCITONIN: PROCALCITONIN: 0.16 ng/mL

## 2018-02-12 MED ORDER — INSULIN DETEMIR 100 UNIT/ML ~~LOC~~ SOLN
65.0000 [IU] | Freq: Two times a day (BID) | SUBCUTANEOUS | Status: DC
  Administered 2018-02-12 (×2): 65 [IU] via SUBCUTANEOUS
  Filled 2018-02-12 (×3): qty 0.65

## 2018-02-12 MED ORDER — GADOBENATE DIMEGLUMINE 529 MG/ML IV SOLN
20.0000 mL | Freq: Once | INTRAVENOUS | Status: AC
  Administered 2018-02-12: 20 mL via INTRAVENOUS

## 2018-02-12 MED ORDER — PANTOPRAZOLE SODIUM 40 MG PO PACK
40.0000 mg | PACK | Freq: Two times a day (BID) | ORAL | Status: DC
  Administered 2018-02-12 (×2): 40 mg
  Filled 2018-02-12 (×3): qty 20

## 2018-02-12 NOTE — Progress Notes (Signed)
21 Days Post-Op Procedure(s) (LRB): CORONARY ARTERY BYPASS GRAFTING (CABG) TIMES Four,  USING LEFT INTERNAL MAMMARY ARTERY, RIGHT RADIAL ARTERY AND RIGHT SAPHENOUS LEG VEIN HARVESTED ENDOSCOPICALLY (N/A) RADIAL ARTERY HARVEST (Right) TRANSESOPHAGEAL ECHOCARDIOGRAM (TEE) (N/A) Subjective: Intubated. unresponsive  Objective: Vital signs in last 24 hours: Temp:  [97 F (36.1 C)-99.3 F (37.4 C)] 97.5 F (36.4 C) (03/22 0733) Pulse Rate:  [85-99] 95 (03/22 0733) Cardiac Rhythm: Normal sinus rhythm (03/22 0400) Resp:  [0-28] 17 (03/22 0733) BP: (81-146)/(47-77) 129/67 (03/22 0733) SpO2:  [89 %-100 %] 98 % (03/22 0733) FiO2 (%):  [30 %] 30 % (03/22 0733) Weight:  [343 lb 11.2 oz (155.9 kg)] 343 lb 11.2 oz (155.9 kg) (03/22 0401)  Hemodynamic parameters for last 24 hours: CVP:  [7 mmHg] 7 mmHg  Intake/Output from previous day: 03/21 0701 - 03/22 0700 In: 2537.5 [I.V.:197.5; NG/GT:2340] Out: 1550 [Urine:1550] Intake/Output this shift: No intake/output data recorded.  General appearance: - Neurologic: unchanged Heart: regular rate and rhythm Lungs: diminished breath sounds left base Abdomen: normal findings: soft Wound: clean and dry  Lab Results: Recent Labs    02/11/18 0359 02/12/18 0410  WBC 15.4* 12.6*  HGB 7.3* 7.0*  HCT 24.1* 22.8*  PLT 273 277   BMET:  Recent Labs    02/11/18 0359 02/12/18 0410  NA 144 143  K 3.8 4.1  CL 108 106  CO2 28 27  GLUCOSE 181* 142*  BUN 34* 42*  CREATININE 0.95 0.95  CALCIUM 8.0* 8.1*    PT/INR: No results for input(s): LABPROT, INR in the last 72 hours. ABG    Component Value Date/Time   PHART 7.378 02/09/2018 0535   HCO3 25.9 02/09/2018 0535   TCO2 34 (H) 02/04/2018 0152   ACIDBASEDEF 3.0 (H) 01/23/2018 0846   O2SAT 95.1 02/09/2018 0535   CBG (last 3)  Recent Labs    02/11/18 1953 02/11/18 2357 02/12/18 0350  GLUCAP 96 124* 98    Assessment/Plan: S/P Procedure(s) (LRB): CORONARY ARTERY BYPASS GRAFTING (CABG)  TIMES Four,  USING LEFT INTERNAL MAMMARY ARTERY, RIGHT RADIAL ARTERY AND RIGHT SAPHENOUS LEG VEIN HARVESTED ENDOSCOPICALLY (N/A) RADIAL ARTERY HARVEST (Right) TRANSESOPHAGEAL ECHOCARDIOGRAM (TEE) (N/A) -Remains unresponsive. Was opening eyes some yesterday afternoon but is not this AM Not following commands Cv- stable on low dose dopamine VDRF- family considering trach/PEG v comfort care ID- s/p 10 days of vanco and cefepime. Afebrile, follow RENAL- creatinine OK, BUN rising, Anemia- stable, with increased BUN ?low grade GI bleed- will increase protonix CBG on low side will decrease levemir to 65 BID    LOS: 21 days    Loreli SlotSteven C Margot Oriordan 02/12/2018

## 2018-02-12 NOTE — Progress Notes (Signed)
Pt transported to MRI and back to 2H02 on vent without complications

## 2018-02-12 NOTE — Progress Notes (Signed)
Transported pt to MRI w/ the assistance of respiratory therapy and Freada BergeronMelissa G, RN

## 2018-02-12 NOTE — Plan of Care (Signed)
Pt "weaned" for several hours and experienced tachypnea intermittently but was otherwise asymptomatic. Pt continues to diurese greater than 30cc of urine an hour and is being turned Q2 to decrease risk of impaired skin integrity.

## 2018-02-12 NOTE — Progress Notes (Addendum)
PULMONARY / CRITICAL CARE MEDICINE   Name: Nicholas Mejia MRN: [id number] DOB: [birth year]    ADMISSION DATE:  02/18/2018 CONSULTATION DATE: March 14   REFERRING MD:  Dr. Dorris FetchHendrickson  CHIEF COMPLAINT: Confusion after coronary artery bypass grafting  HISTORY OF PRESENT ILLNESS:   47 year old male with a past medical history significant for coronary artery disease who has an LVEF of 40% underwent a coronary artery bypass graft on January 22, 2018.  Afterwards was noted to have altered mental status with a new right sided ischemic stroke.  Ultimately required intubation on March 13.  SUBJECTIVE:  Remains unresponsive. Eye lid flickering to sternal rub, No sedation. Remains on dopamine Weaning PS 10/5 , No GAG or cough per nursing T max 99.3 + 10 L since admission  VITAL SIGNS: BP 140/68   Pulse (!) 101   Temp 97.9 F (36.6 C)   Resp (!) 24   Ht 6' 2.02" (1.88 m)   Wt (!) 343 lb 11.2 oz (155.9 kg)   SpO2 98%   BMI 44.11 kg/m   HEMODYNAMICS: CVP:  [7 mmHg-10 mmHg] 10 mmHg  VENTILATOR SETTINGS: Vent Mode: PSV;CPAP FiO2 (%):  [30 %] 30 % Set Rate:  [14 bmp] 14 bmp Vt Set:  [660 mL] 660 mL PEEP:  [5 cmH20] 5 cmH20 Pressure Support:  [5 cmH20-10 cmH20] 10 cmH20 Plateau Pressure:  [23 cmH20-24 cmH20] 24 cmH20  INTAKE / OUTPUT: I/O last 3 completed shifts: In: 3931.7 [I.V.:351.7; NG/GT:3180; IV Piggyback:400] Out: 2360 [Urine:2360]  PHYSICAL EXAMINATION:  General:  Unresponsive, NAD, off sedation HENT: [place]/AT, PERRL, EOM-spontaneous and MMM> ETT PULM: resps even un  labored on vent, few scattered rhonchi, CV: S1, S2, RRR and -M/R/G. GI: Soft, NT, ND and +BS, obese MSK: warm and dry, anasarca , no obvious deformities Neuro: no response, no sedation, no gag, no cough, WD to pain on R only Skin: intact, warm and dry  LABS:  BMET Recent Labs  Lab 02/10/18 0352 02/11/18 0359 02/12/18 0410  NA 146* 144 143  K 3.7 3.8 4.1  CL 113* 108 106  CO2 27 28 27   BUN 34* 34* 42*   CREATININE 1.07 0.95 0.95  GLUCOSE 211* 181* 142*   Electrolytes Recent Labs  Lab 02/07/18 0441  02/09/18 0337 02/10/18 0352 02/11/18 0359 02/12/18 0410  CALCIUM 7.7*   < > 7.5* 7.9* 8.0* 8.1*  MG 3.0*  --  2.8* 2.2  --   --   PHOS 3.8  --  4.2 4.4  --   --    < > = values in this interval not displayed.   CBC Recent Labs  Lab 02/10/18 0352 02/11/18 0359 02/12/18 0410  WBC 15.3* 15.4* 12.6*  HGB 7.6* 7.3* 7.0*  HCT 26.2* 24.1* 22.8*  PLT 291 273 277   Coag's No results for input(s): APTT, INR in the last 168 hours.  Sepsis Markers Recent Labs  Lab 02/10/18 0904 02/11/18 0359 02/12/18 0410  PROCALCITON 0.14 0.15 0.16    ABG Recent Labs  Lab 02/09/18 0535  PHART 7.378  PCO2ART 45.5  PO2ART 81.5*   Liver Enzymes No results for input(s): AST, ALT, ALKPHOS, BILITOT, ALBUMIN in the last 168 hours. Cardiac Enzymes No results for input(s): TROPONINI, PROBNP in the last 168 hours.  Glucose Recent Labs  Lab 02/11/18 1243 02/11/18 1538 02/11/18 1953 02/11/18 2357 02/12/18 0350 02/12/18 0903  GLUCAP 112* 119* 96 124* 98 114*   Imaging Dg Chest Port 1 View  Result Date: 02/12/2018 CLINICAL  DATA:  Status post coronary bypass grafting EXAM: PORTABLE CHEST 1 VIEW COMPARISON:  02/10/2018 FINDINGS: Postsurgical changes are noted. Endotracheal tube, nasogastric catheter and left-sided PICC line are again seen and stable. The right lung remains clear. Left basilar infiltrate and effusion is again identified and stable. No new focal abnormality is seen. IMPRESSION: Stable left basilar changes. Electronically Signed   By: Alcide Clever M.D.   On: 02/12/2018 08:46   STUDIES:  CT head 3/2 > Negative CTA for large vessel occlusion, Small volume acute ischemic left PICA territory infarct. 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 While subocclusive thrombus would be difficult to exclude, this is felt to be less  likely. Query an episode of recent hypotension. Advanced atherosclerotic change affecting the cavernous ICAs bilaterally, left greater than right, slightly progressed from previous. Short-segment 50% proximal left M1 stenosis. CT head 3/4 > new patchy hypodensities in the right corona radiata and right caudate nucleus consistent with acute infarcts. CT head 3/13 > Increasing or new regions of hypoattenuation scattered throughout right MCA distribution white matter, involving body and splenium of corpus callosum, and in the left lateral cerebellar hemisphere adjacent to prior PICA infarction likely representing interval areas of acute infarction. CTA head 3/14 >No emergent large vessel occlusion.  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  Approximately 50% stenosis of the distal right common carotid artery, unchanged. EEG 3/14 > diffuse slowing, focal slowing in Rt hemisphere compared to Lt CT 3/17 with extension of MCA CVA MRI Brain 3/22>>  CULTURES: Blood 3/13 > Aerococcus Urine 3/13>>>neg  Tracheal aspirate 3/13 >normal flora   ANTIBIOTICS: Cefepime 3/13 > Vancomycin 3/13 >  SIGNIFICANT EVENTS: 3/1 admit for CABG 3/2 lethargy, new stroke identified 3/13 worsening lethargy after about a weak of slow progression.  3/14 intubated for airway protection.   LINES/TUBES: LUA PICC 3/2>>> Cortrak 3/12>>> ETT 3/14>>> Foley 3/14>>>  DISCUSSION: 47 year old male who experienced an ischemic stroke after coronary artery bypass grafting.  Now with altered mental status making it unable for him to protect his airway.  Neurologic situation remains very poor.  ASSESSMENT / PLAN:  PULMONARY A: Acute respiratory failure with hypoxemia Obstructive sleep apnea Stable Left basilar infiltrate and effusion is .  P:   Vent support - 8cc/kg   CXR prn  ABG pnr Ok for PS wean as tol but no extubation  given mental status VAP prevention Titrate O2 for sat of 88-92% Needs trach if family wishes to continue aggressive care - see note below re: family discussion   CARDIOVASCULAR A:  CAD, s/p CABG Septic shock P:  Tele EKG prn Pressors off  Dopamine per CVTS  Monitor CVP q shift  Repeat lasix when able - hold for now with soft BP   RENAL A:   Hypernatremia>> resolved P:   Continue free water - decrease to 200 q 4 h  Replace electrolytes as indicated Monitor UO Trend BMET    GASTROINTESTINAL A:   No acute issue P:   Continue tube feeding SUP protonix  HEMATOLOGIC A:   Mild anemia no bleeding HGB 7.0 No obvious bleeding P:  Monitor for bleeding Transfuse per ICU protocol  INFECTIOUS A:   Fever [facility] associated pneumonia Likely left maxillary sinusitis ?neuro fever - pct neg.  Leukocytosis  Left basilar infiltrate/effusion P:   Continue saline sprays D/c Vanco and Cefepime -  D8 abx, cultures neg, pct neg  Follow cultures   ENDOCRINE A:   Low CBG's with Levamir coverage  P:   Decrease Levemir CBG SSI   NEUROLOGIC A:   Ischemic stroke, posterior MCA distribution on right Encephalopathy, acute due to stroke and toxic metabolic No cough No Gag P:   RASS goal: N/A Daily wakeup assessment Follow-up neurology recommendations Remains off all sedation  FAMILY  - Updates: brother updated 3/21  - Inter-disciplinary family meet or Palliative Care meeting due by:  day 7  Discussed with brother at length at bedside 3/21.  He feels that it is "too soon" to know if he is going to get better.  He feels that pt would want trach/peg/prolonged vent support to give him "more time to wake up".  However pts mom seemed to feel the opposite yesterday in discussions with dr. Molli Knock.  Dr. Molli Knock discussed with family at length 3/21( See note) . DNR was established.  Would NOT recommend trach/peg/prolonged support given the severity of his neurologic injury and  poor overall prognosis. Per primary MD, MRI brain 3/22 to further assist in guidance of  Plan of care.  Bevelyn Ngo, AGACNP-BC 02/12/2018  11:05 AM Pager:  [phone]  Attending Note:  47 year old male with PMH as above presenting to PCCM after a CVA and VDRF.  On exam, opens eyes to pain but no other interaction.  I reviewed CXR myself, ETT is in good position.  Discussed with PCCM-NP.  Had an extensive family meeting yesterday and made patient a full DNR with no escalation of care and family will let us know when they are ready.  He is not a comfort care at this point so we will continue treating just no escalation.  D/C weaning trials, emphasize more comfort at this point.  The patient is critically ill with multiple organ systems failure and requires high complexity decision making for assessment and support, frequent evaluation and titration of therapies, application of advanced monitoring technologies and extensive interpretation of multiple databases.   Critical Care Time devoted to patient care services described in this note is  35  Minutes. This time reflects time of care of this signee Dr Koren Bound. This critical care time does not reflect procedure time, or teaching time or supervisory time of PA/NP/Med student/Med Resident etc but could involve care discussion time.  Alyson Reedy, M.D. [facility] Pulmonary/Critical Care Medicine. Pager: [phone]. After hours pager: [phone].

## 2018-02-13 ENCOUNTER — Inpatient Hospital Stay (HOSPITAL_COMMUNITY): Payer: BLUE CROSS/BLUE SHIELD

## 2018-02-13 LAB — COMPREHENSIVE METABOLIC PANEL
ALK PHOS: 77 U/L (ref 38–126)
ALT: 36 U/L (ref 17–63)
AST: 30 U/L (ref 15–41)
Albumin: 1.7 g/dL — ABNORMAL LOW (ref 3.5–5.0)
Anion gap: 6 (ref 5–15)
BILIRUBIN TOTAL: 0.5 mg/dL (ref 0.3–1.2)
BUN: 45 mg/dL — ABNORMAL HIGH (ref 6–20)
CALCIUM: 8 mg/dL — AB (ref 8.9–10.3)
CO2: 28 mmol/L (ref 22–32)
CREATININE: 0.96 mg/dL (ref 0.61–1.24)
Chloride: 108 mmol/L (ref 101–111)
GFR calc Af Amer: >60 mL/min (ref >60)
Glucose, Bld: 131 mg/dL — ABNORMAL HIGH (ref 65–99)
Potassium: 3.9 mmol/L (ref 3.5–5.1)
Sodium: 142 mmol/L (ref 135–145)
TOTAL PROTEIN: 6.2 g/dL — AB (ref 6.5–8.1)

## 2018-02-13 LAB — GLUCOSE, CAPILLARY
GLUCOSE-CAPILLARY: 117 mg/dL — AB (ref 65–99)
GLUCOSE-CAPILLARY: 119 mg/dL — AB (ref 65–99)
Glucose-Capillary: 110 mg/dL — ABNORMAL HIGH (ref 65–99)
Glucose-Capillary: 97 mg/dL (ref 65–99)
Glucose-Capillary: 121 mg/dL — ABNORMAL HIGH (ref 65–99)

## 2018-02-13 LAB — CBC
HEMATOCRIT: 23.4 % — AB (ref 39.0–52.0)
HEMOGLOBIN: 6.8 g/dL — AB (ref 13.0–17.0)
MCH: 28.5 pg (ref 26.0–34.0)
MCHC: 29.1 g/dL — ABNORMAL LOW (ref 30.0–36.0)
MCV: 97.9 fL (ref 78.0–100.0)
Platelets: 394 10*3/uL (ref 150–400)
RBC: 2.39 MIL/uL — AB (ref 4.22–5.81)
RDW: 14.8 % (ref 11.5–15.5)
WBC: 13.6 10*3/uL — AB (ref 4.0–10.5)

## 2018-02-13 LAB — PREPARE RBC (CROSSMATCH)

## 2018-02-13 MED ORDER — ALTEPLASE 2 MG IJ SOLR
2.0000 mg | Freq: Once | INTRAMUSCULAR | Status: AC
  Administered 2018-02-13: 2 mg

## 2018-02-13 MED ORDER — PANTOPRAZOLE SODIUM 40 MG PO PACK
40.0000 mg | PACK | Freq: Every day | ORAL | Status: DC
  Filled 2018-02-13: qty 20

## 2018-02-13 MED ORDER — GERHARDT'S BUTT CREAM
TOPICAL_CREAM | Freq: Two times a day (BID) | CUTANEOUS | Status: DC
  Administered 2018-02-13: 1 via TOPICAL
  Administered 2018-02-13: 22:00:00 via TOPICAL
  Administered 2018-02-14: 1 via TOPICAL
  Filled 2018-02-13: qty 1

## 2018-02-13 MED ORDER — SODIUM CHLORIDE 0.9 % IV SOLN: Freq: Once | INTRAVENOUS | Status: DC

## 2018-02-13 MED ORDER — BISACODYL 5 MG PO TBEC: 10.0000 mg | DELAYED_RELEASE_TABLET | Freq: Every day | ORAL | Status: DC | PRN

## 2018-02-13 NOTE — Plan of Care (Signed)
Pt continues to respond to pain (flexion) and intermittently responds to touch and voice. Pt is currently diuresing greater than 30 cc of urine an hour and is being turned Q2 to prevent further skin breakdown.

## 2018-02-13 NOTE — Progress Notes (Signed)
PCCM Progress Note  Admit date: 02/11/2018 Consult date: 02/04/2018  CC: chest pain  HPI: 47 yo male with CAD and systolic CHF had CABG 01/22/18.  Developed altered mental status from CVAs post op.  Remained on vent post op.  Subjective: Remains off sedation.  Vital signs: BP (!) 157/70   Pulse 98   Temp 98.2 F (36.8 C) (Rectal)   Resp 19   Ht 6' 2.02" (1.88 m)   Wt (!) 336 lb 10.3 oz (152.7 kg)   SpO2 98%   BMI 43.20 kg/m   Intake/output: I/O last 3 completed shifts: In: 3324.4 [I.V.:284.4; NG/GT:3040] Out: 3070 [Urine:3070]  Physical exam:  General - decerebrate posturing with suctioning/stimulation Eyes - downward gaze ENT - ETT in place Cardiac - regular, no murmur Chest - no wheeze, rales Abd - soft, non tender Ext - no edema Skin - no rashes Neuro - doesn't follow commands  Labs: CBC Recent Labs    02/11/18 0359 02/12/18 0410 02/13/18 0430  WBC 15.4* 12.6* 13.6*  HGB 7.3* 7.0* 6.8*  HCT 24.1* 22.8* 23.4*  PLT 273 277 394    Coag's No results for input(s): APTT, INR in the last 72 hours.  BMET Recent Labs    02/11/18 0359 02/12/18 0410 02/13/18 0430  NA 144 143 142  K 3.8 4.1 3.9  CL 108 106 108  CO2 28 27 28   BUN 34* 42* 45*  CREATININE 0.95 0.95 0.96  GLUCOSE 181* 142* 131*    Electrolytes Recent Labs    02/11/18 0359 02/12/18 0410 02/13/18 0430  CALCIUM 8.0* 8.1* 8.0*    Sepsis Markers Recent Labs    02/11/18 0359 02/12/18 0410  PROCALCITON 0.15 0.16    ABG No results for input(s): PHART, PCO2ART, PO2ART in the last 72 hours.  Liver Enzymes Recent Labs    02/13/18 0430  AST 30  ALT 36  ALKPHOS 77  BILITOT 0.5  ALBUMIN 1.7*    Cardiac Enzymes No results for input(s): TROPONINI, PROBNP in the last 72 hours.  Glucose Recent Labs    02/12/18 0903 02/12/18 1638 02/12/18 2018 02/13/18 0049 02/13/18 0429 02/13/18 0737  GLUCAP 114* 89 90 117* 110* 97    Imaging Mr Brain W Wo Contrast  Result Date:  02/12/2018 CLINICAL DATA:  Altered mental status and recent CABG. EXAM: MRI HEAD WITHOUT AND WITH CONTRAST TECHNIQUE: Multiplanar, multiecho pulse sequences of the brain and surrounding structures were obtained without and with intravenous contrast. CONTRAST:  20mL MULTIHANCE GADOBENATE DIMEGLUMINE 529 MG/ML IV SOLN COMPARISON:  Head CT 02/07/2018 MRI brain 12/31/2016 FINDINGS: Brain: The midline structures are normal. There are numerous scattered foci of abnormal diffusion restriction. This includes confluent areas of abnormality in the corpus callosum and right centrum semiovale. There are multiple small foci in the brainstem. Large area in the left cerebellum with smaller foci in the right cerebellum, both temporal lobes and the right parietal lobe. There is moderate edema of the corpus callosum and pericallosal white matter. No midline shift. There is moderate contrast enhancement within the left cerebellum, corpus callosum and pericallosal white matter. Small focus of contrast enhancement in the right insula. The brain parenchymal signal is normal for the patient's age. No age-advanced or lobar predominant atrophy. No chronic microhemorrhage or superficial siderosis. Vascular: Major intracranial arterial and venous sinus flow voids are preserved. Skull and upper cervical spine: The visualized skull base, calvarium, upper cervical spine and extracranial soft tissues are normal. Sinuses/Orbits: Diffuse paranasal sinus opacification bilateral mastoid effusions.  Normal orbits. IMPRESSION: 1. Widespread areas of subacute infarction, in unchanged distribution from the CT of 02/07/2018, predominantly involving the corpus callosum and pericallosal white matter, right MCA territory white matter and left cerebellum. 2. There is no herniation or mass effect.  No acute hemorrhage. Electronically Signed   By: Deatra RobinsonKevin  Herman M.D.   On: 02/12/2018 14:43   Dg Chest Port 1 View  Result Date: 02/13/2018 CLINICAL DATA:   Respiratory failure. EXAM: PORTABLE CHEST 1 VIEW COMPARISON:  Chest x-ray from yesterday. FINDINGS: Endotracheal and enteric tubes are unchanged. Stable left upper extremity PICC line. Stable cardiomegaly status post CABG. Normal pulmonary vascularity. Unchanged left lower lobe consolidation and adjacent small left pleural effusion. The right lung remains clear. IMPRESSION: Stable left lower lobe consolidation and adjacent pleural effusion. Electronically Signed   By: Obie DredgeWilliam T Derry M.D.   On: 02/13/2018 07:40   Dg Chest Port 1 View  Result Date: 02/12/2018 CLINICAL DATA:  Status post coronary bypass grafting EXAM: PORTABLE CHEST 1 VIEW COMPARISON:  02/10/2018 FINDINGS: Postsurgical changes are noted. Endotracheal tube, nasogastric catheter and left-sided PICC line are again seen and stable. The right lung remains clear. Left basilar infiltrate and effusion is again identified and stable. No new focal abnormality is seen. IMPRESSION: Stable left basilar changes. Electronically Signed   By: Alcide CleverMark  Lukens M.D.   On: 02/12/2018 08:46    Assessment/plan:  Acute respiratory failure with hypoxia. Failure to wean due to mental status. - full vent support until family decides about transitioning to comfort care   CAD s/p CABG. Chronic systolic CHF. - per TCTS  Anemia of critical illness. - defer further lab testing  HCAP. 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 - DNR - family likely to transition to comfort measures and have vent removed 3/24  Coralyn HellingVineet Jaymien Landin, MD [facility] Pulmonary/Critical Care 02/13/2018, 10:04 AM Pager:  [phone] After 3pm call: [phone]

## 2018-02-13 NOTE — Progress Notes (Signed)
eLink Physician-Brief Progress Note Patient Name: Nicholas Mejia DOB: 03/16/1971 MRN: [id number]   Date of Service  02/13/2018  HPI/Events of Note  hgb 6.8.  No active bleeding  eICU Interventions  Transfuse 1 unit PRBC     Intervention Category Intermediate Interventions: Other:  Henry RusselSMITH, Jaiyon Wander, P 02/13/2018, 5:23 AM

## 2018-02-13 NOTE — Progress Notes (Signed)
CRITICAL VALUE ALERT  Critical Value: Hgb 6.8  Date & Time Notied:  02/13/18 @ 0515  Provider Notified: E-Link MD Katrinka BlazingSmith  Orders Received/Actions taken: Transfuse 1 unit of PRBC

## 2018-02-13 NOTE — Progress Notes (Signed)
      [address]       [place] [id number]             [phone]        CARDIOTHORACIC SURGERY PROGRESS NOTE   R22 Days Post-Op Procedure(s) (LRB): CORONARY ARTERY BYPASS GRAFTING (CABG) TIMES Four,  USING LEFT INTERNAL MAMMARY ARTERY, RIGHT RADIAL ARTERY AND RIGHT SAPHENOUS LEG VEIN HARVESTED ENDOSCOPICALLY (N/A) RADIAL ARTERY HARVEST (Right) TRANSESOPHAGEAL ECHOCARDIOGRAM (TEE) (N/A)  Subjective: Unresponsive on vent.  No purposeful movements at all.  Objective: Vital signs: BP Readings from Last 1 Encounters:  02/13/18 139/64   Pulse Readings from Last 1 Encounters:  02/13/18 (!) 105   Resp Readings from Last 1 Encounters:  02/13/18 18   Temp Readings from Last 1 Encounters:  02/13/18 98.8 F (37.1 C)    Hemodynamics: CVP:  [7 mmHg-10 mmHg] 7 mmHg  Physical Exam:  Rhythm:   sinus  Breath sounds: coarse  Heart sounds:  RRR  Incisions:  Clean and dry  Abdomen:  Soft, non-distended  Extremities:  Warm, well-perfused  Neuro:   No improvement whatsoever   Intake/Output from previous day: 03/22 0701 - 03/23 0700 In: 2149.6 [I.V.:189.6; NG/GT:1960] Out: 2320 [Urine:2320] Intake/Output this shift: Total I/O In: 200 [NG/GT:200] Out: -   Lab Results:  CBC: Recent Labs    02/12/18 0410 02/13/18 0430  WBC 12.6* 13.6*  HGB 7.0* 6.8*  HCT 22.8* 23.4*  PLT 277 394    BMET:  Recent Labs    02/12/18 0410 02/13/18 0430  NA 143 142  K 4.1 3.9  CL 106 108  CO2 27 28  GLUCOSE 142* 131*  BUN 42* 45*  CREATININE 0.95 0.96  CALCIUM 8.1* 8.0*     PT/INR:  No results for input(s): LABPROT, INR in the last 72 hours.  CBG (last 3)  Recent Labs    02/13/18 0049 02/13/18 0429 02/13/18 0737  GLUCAP 117* 110* 97    ABG    Component Value Date/Time   PHART 7.378 02/09/2018 0535   PCO2ART 45.5 02/09/2018 0535   PO2ART 81.5 (L) 02/09/2018 0535   HCO3 25.9 02/09/2018 0535   TCO2 34 (H) 02/04/2018 0152   ACIDBASEDEF 3.0 (H)  01/23/2018 0846   O2SAT 95.1 02/09/2018 0535    CXR: PORTABLE CHEST 1 VIEW  COMPARISON:  Chest x-ray from yesterday.  FINDINGS: Endotracheal and enteric tubes are unchanged. Stable left upper extremity PICC line. Stable cardiomegaly status post CABG. Normal pulmonary vascularity. Unchanged left lower lobe consolidation and adjacent small left pleural effusion. The right lung remains clear.  IMPRESSION: Stable left lower lobe consolidation and adjacent pleural effusion.   Electronically Signed   By: Obie DredgeWilliam T Derry M.D.   On: 02/13/2018 07:40  Assessment/Plan: S/P Procedure(s) (LRB): CORONARY ARTERY BYPASS GRAFTING (CABG) TIMES Four,  USING LEFT INTERNAL MAMMARY ARTERY, RIGHT RADIAL ARTERY AND RIGHT SAPHENOUS LEG VEIN HARVESTED ENDOSCOPICALLY (N/A) RADIAL ARTERY HARVEST (Right) TRANSESOPHAGEAL ECHOCARDIOGRAM (TEE) (N/A)  No improvement Prognosis grim  Plan as outlined per Pulm/CCM team, Neurology team, and Dr Ricky StabsHendrickson   Clarence H Owen, MD 02/13/2018 8:12 AM

## 2018-02-14 MED ORDER — ATROPINE SULFATE 1 % OP SOLN: 4.0000 [drp] | OPHTHALMIC | Status: DC | PRN

## 2018-02-14 MED ORDER — ACETAMINOPHEN 650 MG RE SUPP: 650.0000 mg | RECTAL | Status: DC | PRN

## 2018-02-14 MED ORDER — GLYCOPYRROLATE 0.2 MG/ML IJ SOLN: 0.1000 mg | INTRAMUSCULAR | Status: DC | PRN

## 2018-02-14 MED ORDER — MORPHINE 100MG IN NS 100ML (1MG/ML) PREMIX INFUSION
10.0000 mg/h | INTRAVENOUS | Status: DC
  Administered 2018-02-14: 10 mg/h via INTRAVENOUS
  Filled 2018-02-14: qty 100

## 2018-02-14 MED ORDER — DEXTROSE 5 % IV SOLN
5.0000 mg/h | INTRAVENOUS | Status: DC
  Administered 2018-02-14: 5 mg/h via INTRAVENOUS
  Filled 2018-02-14: qty 25

## 2018-02-14 MED ORDER — LORAZEPAM BOLUS VIA INFUSION
2.0000 mg | INTRAVENOUS | Status: DC | PRN
  Filled 2018-02-14: qty 5

## 2018-02-14 MED ORDER — MORPHINE BOLUS VIA INFUSION
5.0000 mg | INTRAVENOUS | Status: DC | PRN
  Filled 2018-02-14: qty 20

## 2018-02-15 LAB — TYPE AND SCREEN
ABO/RH(D): A POS
Antibody Screen: NEGATIVE
Unit division: 0

## 2018-02-15 LAB — GLUCOSE, CAPILLARY
Glucose-Capillary: 115 mg/dL — ABNORMAL HIGH (ref 65–99)
Glucose-Capillary: 78 mg/dL (ref 65–99)

## 2018-02-15 LAB — BPAM RBC
Blood Product Expiration Date: 201903272359
ISSUE DATE / TIME: 201903200851
Unit Type and Rh: 6200

## 2018-02-22 NOTE — Progress Notes (Signed)
Patient was extubated per order of "Withdrawal of Life Sustaining Treatment". No complications. Cuff leak was present. Patient had no cough. RN and family at bedside.

## 2018-02-22 NOTE — Progress Notes (Signed)
No heart or lung sounds noted on ascultation for 1 minute. Accompanied by Gilda CreaseStephanie Correa RN. Pt's family at bedside. Comfort and support given, family aware of time of death (1456). CTS notified of patients passing.

## 2018-02-22 NOTE — Progress Notes (Signed)
      [address]       [place] [id number]             [phone]        CARDIOTHORACIC SURGERY PROGRESS NOTE   R23 Days Post-Op Procedure(s) (LRB): CORONARY ARTERY BYPASS GRAFTING (CABG) TIMES Four,  USING LEFT INTERNAL MAMMARY ARTERY, RIGHT RADIAL ARTERY AND RIGHT SAPHENOUS LEG VEIN HARVESTED ENDOSCOPICALLY (N/A) RADIAL ARTERY HARVEST (Right) TRANSESOPHAGEAL ECHOCARDIOGRAM (TEE) (N/A)  Subjective: No change.  Unresponsive w/ no improvement in neuro status  Objective: Vital signs: BP Readings from Last 1 Encounters:  02/06/2018 122/68   Pulse Readings from Last 1 Encounters:  01/27/2018 100   Resp Readings from Last 1 Encounters:  02/17/2018 (!) 22   Temp Readings from Last 1 Encounters:  02/04/2018 (!) 100.8 F (38.2 C) (Oral)    Hemodynamics:    Physical Exam:  Rhythm:   sinus  Breath sounds: coarse  Heart sounds:  RRR  Incisions:  Clean and dry  Abdomen:  Soft, non-distended, non-tender  Extremities:  Warm, well-perfused   Intake/Output from previous day: 03/23 0701 - 03/24 0700 In: 1570.6 [I.V.:10.6; NG/GT:1560] Out: 2510 [Urine:2510] Intake/Output this shift: No intake/output data recorded.  Lab Results:  CBC: Recent Labs    02/12/18 0410 02/13/18 0430  WBC 12.6* 13.6*  HGB 7.0* 6.8*  HCT 22.8* 23.4*  PLT 277 394    BMET:  Recent Labs    02/12/18 0410 02/13/18 0430  NA 143 142  K 4.1 3.9  CL 106 108  CO2 27 28  GLUCOSE 142* 131*  BUN 42* 45*  CREATININE 0.95 0.96  CALCIUM 8.1* 8.0*     PT/INR:  No results for input(s): LABPROT, INR in the last 72 hours.  CBG (last 3)  Recent Labs    02/13/18 0737 02/13/18 1143 02/13/18 1547  GLUCAP 97 119* 121*    ABG    Component Value Date/Time   PHART 7.378 02/09/2018 0535   PCO2ART 45.5 02/09/2018 0535   PO2ART 81.5 (L) 02/09/2018 0535   HCO3 25.9 02/09/2018 0535   TCO2 34 (H) 02/04/2018 0152   ACIDBASEDEF 3.0 (H) 01/23/2018 0846   O2SAT 95.1 02/09/2018 0535     CXR: n/a  Assessment/Plan: S/P Procedure(s) (LRB): CORONARY ARTERY BYPASS GRAFTING (CABG) TIMES Four,  USING LEFT INTERNAL MAMMARY ARTERY, RIGHT RADIAL ARTERY AND RIGHT SAPHENOUS LEG VEIN HARVESTED ENDOSCOPICALLY (N/A) RADIAL ARTERY HARVEST (Right) TRANSESOPHAGEAL ECHOCARDIOGRAM (TEE) (N/A)  No improvement Prognosis grim  Plan as outlined per Pulm/CCM team, Neurology team, and Dr Dorris FetchHendrickson Will ask for consultation from Palliative Care Team to assist with family discussions and management   Nicholas Nailslarence H Mehlani Blankenburg, MD 01/24/2018 8:59 AM

## 2018-02-22 NOTE — Progress Notes (Signed)
All patient belongings returned to family. [place] donor services notified of patients passing and time of death. Pt on hold for tissue donation. Not candidate for eye or organ donation. Funeral home information entered in post mortem check list. Post mortem care performed per [facility] staff.

## 2018-02-22 NOTE — Progress Notes (Signed)
Had extensive meeting with pt's family.  Discussed sequences of events and neuro imaging findings.  Explained that he has not been on any sedation for days and remains in comatose state.  They are all in agreement that he would not want to remain like this with no hope for meaningful improvement.  They do not want tracheostomy and long term support in a nursing home.  Family is in agreement to proceed with extubation and transition to comfort measures.  I explained this process to them, and explained that the time course after extubation is difficulty to predict.  If it is a more prolonged course, then we will arrange for transfer to medical ward.  Coralyn HellingVineet Janisa Labus, MD [facility] Pulmonary/Critical Care 2018/07/05, 11:17 AM Pager:  [phone] After 3pm call: [phone]

## 2018-02-22 NOTE — Progress Notes (Signed)
Chaplain visited with the family at the passing of the PT.  Chaplain provided prayer support and grief support to the family and helped explain the process moving forward.

## 2018-02-22 NOTE — Progress Notes (Signed)
85 ml Morphine and 35 ml ativan wasted in skin with Higinio PlanShammy Adams RN.

## 2018-02-22 NOTE — Discharge Summary (Addendum)
DEATH SUMMARY   Patient Details  Name: Nicholas Mejia MRN: [id number] DOB: July 24, 1971  Admission/Discharge Information   Admit Date:  01/30/2018  Date of Death: Date of Death: 02-05-18  Time of Death: Time of Death: 1456  Length of Stay: 23  Referring Physician: Andi DevonShelton, Kimberly, MD   Reason(s) for Hospitalization   HPI: [name] is sent for consultation regarding left main and three-vessel coronary disease  [name] is a 47 year old man with a past medical history significant for stroke, type 2 diabetes without complication, hypertension, morbid obesity, coronary artery disease, and ischemic cardiomyopathy.  He was recently diagnosed with sleep apnea.  He had a stroke in February 2018.  He has some residual paresthesias in the left side of his face and his right forearm.  He does not have any motor deficit.  On December 18 he was short of breath and felt fatigued at work.  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  He also had peripheral edema.  He was having some sharp chest pains at that time but there were not felt to be cardiac in origin.  He was referred to Dr. Rosemary HolmsPatwardhan.  A nuclear stress test showed a large scar with peri-infarct ischemia, ejection fraction was 40%.  He did have dyspnea and dizziness during the study.  He underwent cardiac catheterization on 12/22/2017.  He revealed a 70% left main stenosis.  There was an 80% stenosis and a diffusely diseased right coronary.  There was disease involving a large bifurcated ramus vessel and a 70% stenosis in the LAD.  His PDA was totally occluded and filled via collaterals.  He continues to have some chest tightness and shortness of breath with exertion.  He says this will come on if he walks about a block.  He has not had any rest or nocturnal symptoms.  He was fully evaluated by Dr Dorris FetchHendrickson who reviewed studies as well as history and Physical exam and admitted for durgical procedure  Diagnoses  Preliminary cause of death:   Secondary Diagnoses (including complications and co-morbidities):  Active Problems:   S/P CABG (coronary artery bypass graft)   Cerebral thrombosis with cerebral infarction   Somnolence   Pressure injury of skin   Acute encephalopathy   Brief Hospital Course (including significant findings, care, treatment, and services provided and events leading to death)  Nicholas Mejia is a 47 y.o. year old male who  Admitted and underwent the following procedure    DATE OF PROCEDURE:  01/25/2018 DATE OF DISCHARGE:                              OPERATIVE REPORT   PREOPERATIVE DIAGNOSIS:  Three-vessel coronary artery disease.  POSTOPERATIVE DIAGNOSIS:  Three-vessel coronary artery disease.  PROCEDURE:   Median sternotomy, extracorporeal circulation, Coronary artery bypass grafting x 4      Left internal mammary artery to LAD,      Right radial artery to ramus intermedius 1,      Sequential saphenous vein graft to acute marginal and posterior descending Endoscopic vein harvest right thigh.  SURGEON:  Salvatore DecentSteven C. Dorris FetchHendrickson, M.D.  ASSISTANT:  Rowe ClackWayne E. Gold, P.A.-C.  ANESTHESIA:  General.  He tolerated it well and was taken to the SICU in stable condition.    Post Operative Hospital Course:  He initially was hemodynamically stable , and extubated without trouble with usual protocol however was noted to be lethargic. He was found to  have left sided weakness on POD # 1. Head CT and neurology consultation were obtained. Ct showed acute ischemic CVA in left cerebellum with possible watershed infarcts in the R MCA territory. A CT perfusion scan showed no acute ischemia and Neurology felt intervention was not warranted. PT/OT/Speech consults were obtained. EEG findings showed milld generalized cerebral dysfunction . Focal left hemisphere slowing without eleptiform features. A repeat CT on 01/25/18 showed edema. He was started on therapies and Tube feedings. He continued to have significant  Left hemiparesis. He remained hemodynamically stable in sinus rhythm. He began to follow some very simple commands. He developed some acute renal insufficiency. On 02/03/18 he became much less responsive. He was noted to be hypotensive and sepsis was suspected. He required re-intubation. He developed a presumed pneumonia and was started on Vanco and Cefipime. Neosynephrine and dopamine infusions were added for hemodynamic support. CCM was consulted to assist with management . He also required a norepinephrine infusion for blood pressure support of sepsis. He was unresponsive and was found to have additional CVA. He was felt to be in a comatose state with little hope for meaningful recovery. After aggressive treatment did not result in improvement discussions were held with the family who felt he would not want to continue to be supported in this fashion. They requested terminal extubation. Support was withdrawn withdrawn and he was started on comfort care only . He passed on 09-Mar-2018.  Pertinent Labs and Studies  Significant Diagnostic Studies Ct Angio Head W Or Wo Contrast  Result Date: 02/04/2018 CLINICAL DATA:  Altered mental status.  Recent CABG. EXAM: CT ANGIOGRAPHY HEAD AND NECK TECHNIQUE: Multidetector CT imaging of the head and neck was performed using the standard protocol during bolus administration of intravenous contrast. Multiplanar CT image reconstructions and MIPs were obtained to evaluate the vascular anatomy. Carotid stenosis measurements (when applicable) are obtained utilizing NASCET criteria, using the distal internal carotid diameter as the denominator. CONTRAST:  50mL ISOVUE-370 IOPAMIDOL (ISOVUE-370) INJECTION 76% COMPARISON:  CTA head neck 01/23/2018. FINDINGS: CTA NECK FINDINGS Aortic arch: There is no calcific atherosclerosis of the aortic arch. There is no aneurysm, dissection or hemodynamically significant stenosis of the visualized ascending aorta and aortic arch. Conventional 3  vessel aortic branching pattern. The visualized proximal subclavian arteries are widely patent. Right carotid system: Moderate narrowing of the distal right common carotid artery, measuring 50%. There is mild atherosclerotic calcification at the carotid bifurcation. No hemodynamically significant stenosis of the remainder of the cervical right internal carotid artery. Left carotid system: The left common carotid origin is widely patent. There is no common carotid or internal carotid artery dissection or aneurysm. Mild atherosclerotic calcification at the carotid bifurcation without hemodynamically significant stenosis. Vertebral arteries: The vertebral system is right dominant. There is narrowing of the right vertebral artery origin due to atherosclerotic calcification. The remainder of the right vertebral artery is normal. Normal left vertebral artery. There is no bony spinal canal stenosis.  No lytic or blastic lesion. Other neck: Endotracheal tube tip is at the level of clavicular heads. Upper chest: Large left pleural effusion, incompletely visualized. Review of the MIP images confirms the above findings CTA HEAD FINDINGS Anterior circulation: --Intracranial internal carotid arteries: There is atherosclerotic calcification of both internal carotid arteries at the skull base. There is severe stenosis of the right ICA at the proximal lacerum segment. There is severe left ICA stenosis at the distal cavernous and clinoid segment. These findings are unchanged. --Anterior cerebral arteries: Normal. --Middle cerebral arteries:  Unchanged moderate-to-severe stenosis of the mid M1 segment of the right middle cerebral artery. Normal left MCA. --Posterior communicating arteries: Absent bilaterally. Posterior circulation: --Basilar artery: Normal. --Posterior cerebral arteries: Normal. --Superior cerebellar arteries: Normal. --Inferior cerebellar arteries: Normal posterior inferior cerebellar arteries. Anterior inferior  cerebellar arteries are not clearly visible, but this is not uncommon. Venous sinuses: As permitted by contrast timing, patent. Anatomic variants: None Delayed phase: No parenchymal contrast enhancement. Review of the MIP images confirms the above findings. IMPRESSION: 1. No emergent large vessel occlusion. 2. 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 3. Approximately 50% stenosis of the distal right common carotid artery, unchanged. 4. Large left pleural effusion. Electronically Signed   By: Deatra Robinson M.D.   On: 02/04/2018 01:42   Ct Angio Head W Or Wo Contrast  Result Date: 01/23/2018 CLINICAL DATA:  Initial evaluation for lethargy, difficulty to wake up. Status post CABG. EXAM: CT ANGIOGRAPHY HEAD AND NECK TECHNIQUE: Multidetector CT imaging of the head and neck was performed using the standard protocol during bolus administration of intravenous contrast. Multiplanar CT image reconstructions and MIPs were obtained to evaluate the vascular anatomy. Carotid stenosis measurements (when applicable) are obtained utilizing NASCET criteria, using the distal internal carotid diameter as the denominator. CONTRAST:  50mL ISOVUE-370 IOPAMIDOL (ISOVUE-370) INJECTION 76% COMPARISON:  Prior head CT from 01/02/2017. FINDINGS: CT HEAD FINDINGS Brain: Cerebral volume within normal limits for age. Scattered patchy hypodensity within the subcortical white matter of the right cerebral hemisphere most likely related to remote right MCA territory infarct. No acute intracranial hemorrhage. Parenchymal hypodensity within the inferior left cerebellar hemisphere consistent with acute ischemic infarct, left PICA territory. No other acute large vessel territory infarct. No mass lesion, midline shift or mass effect. No hydrocephalus. No extra-axial fluid collection. Vascular: No hyperdense vessel. Skull: Scalp soft tissues and calvarium  within normal limits. Sinuses: Paranasal sinuses are clear. No mastoid effusion. Orbits: Globes and oval soft tissues within normal limits. Review of the MIP images confirms the above findings CTA NECK FINDINGS Aortic arch: Visualized aortic arch of normal caliber with normal branch pattern. Sequelae of recent CABG noted. No flow-limiting stenosis about the origin of the great vessels. Visualized subclavian arteries widely patent. Right carotid system: Right common carotid artery widely patent proximally. There is moderate atheromatous narrowing of approximately 50% at the distal right common carotid artery (series 12, image 228). A centric max plaque about the proximal right ICA with associated mild narrowing of approximately 35% by NASCET criteria. Right ICA widely patent distally to the skull base without stenosis, dissection, or occlusion. Left carotid system: Left common carotid artery patent from its origin to the bifurcation without stenosis. Mild calcified atheromatous plaque about the left bifurcation without flow-limiting stenosis. Left ICA patent distally to the skull base without flow-limiting stenosis. Vertebral arteries: Both of the vertebral arteries arise from the subclavian arteries. Calcified plaque at the origin of the vertebral arteries bilaterally with approximately 70% stenosis on the right and 50% stenosis on the left. Multifocal atheromatous irregularity seen throughout the vertebral arteries within the neck but remain patent to the skull base. Skeleton: No acute osseous abnormality. No worrisome lytic or blastic osseous lesions. Recent sternotomy noted. Other neck: No acute soft tissue abnormality within the neck. Right IJ approach central venous catheter. Upper chest: Sequelae of recent CABG. Layering bilateral pleural effusions with associated atelectatic changes noted. Partially visualized lungs are otherwise  clear. Review of the MIP images confirms the above findings CTA HEAD FINDINGS  Anterior circulation: Petrous segments patent bilaterally. There is extensive calcified atheromatous irregularity throughout the cavernous ICAs bilaterally, slightly progressive as compared to previous. Diameter at the proximal cavernous right ICA measures approximately 2 mm, similar to previous. Focal stenosis at the proximal cavernous left ICA slightly worsened now measuring approximately 2 mm as well. Distal cavernous left ICA also measures approximately 2 mm, similar to previous. ICAs are patent to the termini. A1 segments irregular but patent. Patent anterior communicating artery. Anterior cerebral arteries patent to their distal aspects. Approximate moderate 50% stenosis at the proximal left M1 segment. Left M1 patent distally. Normal left MCA bifurcation. Distal left MCA branches perfuse. Distal small vessel atheromatous irregularity present throughout the MCA branches bilaterally. There is a focal severe near occlusive stenosis at the proximal right M1 segment, worsened from previous. While subocclusive thrombus would be difficult to exclude, this is less favored (series 12, image 99). Right M1 segment patent distally. No proximal right M2 occlusion. Distal right MCA branches well perfused. Posterior circulation: Moderate stenosis of the left vertebral artery as it crosses into the cranial vault. There is additional severe stenosis of the left V4 segment distally beyond the takeoff of the PICA. Left PICA is patent proximally. Approximate 50% stenosis within the right V4 segment prior to the vertebrobasilar junction. Right PICA patent as well. Basilar artery demonstrates multifocal atheromatous irregularity but is patent to its distal aspect. Superior cerebral arteries patent bilaterally. Both of the posterior cerebral arteries primarily supplied via the basilar. PCAs irregular with multifocal moderate to severe stenoses but are patent to their distal aspects. Venous sinuses: Patent. Anatomic variants: None  significant.  No aneurysm. Delayed phase: No abnormal enhancement. Review of the MIP images confirms the above findings IMPRESSION: 1. Negative CTA for large vessel occlusion. 2. 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 Left PICA is patent proximally. 3. 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 While subocclusive thrombus would be difficult to exclude, this is felt to be less likely. Query an episode of recent hypotension, which superimposed on the underlying stenosis could result in patient's left-sided symptoms. 4. Advanced atherosclerotic change affecting the cavernous ICAs bilaterally, left greater than right, slightly progressed from previous. 5. Short-segment 50% proximal left M1 stenosis. 6. Additional advanced for age diffuse atherosclerotic change throughout the extracranial intracranial circulation as above. Critical Value/emergent results were called by telephone at the time of interpretation on 01/23/2018 at 3:51 pm to Dr. Arther Dames, who verbally acknowledged these results. Electronically Signed   By: Rise Mu M.D.   On: 01/23/2018 16:19   Dg Abd 1 View  Result Date: 01/29/2018 CLINICAL DATA:  Nasogastric tube placement. EXAM: ABDOMEN - 1 VIEW COMPARISON:  Radiograph of January 07, 2017. FINDINGS: Feeding tube is seen which is kinked in proximal stomach, and the catheter tip is seen directed retrograde back into the distal esophagus. No abnormal bowel gas pattern is noted. IMPRESSION: Feeding tube is seen kink in proximal stomach, with distal tip directed retrograde into distal esophagus. Electronically Signed   By: Lupita Raider, M.D.   On: 01/29/2018 08:52   Ct Head Wo Contrast  Result Date: 02/07/2018 CLINICAL DATA:  Follow-up examination for acute stroke. EXAM: CT HEAD WITHOUT CONTRAST TECHNIQUE: Contiguous axial images were obtained from the base of the skull through the vertex without intravenous  contrast. COMPARISON:  Prior CT from 02/03/2018 as  well as multiple previous exams. FINDINGS: Brain: Continued interval evolution of cytotoxic edema involving the periventricular white matter of the right frontal and parietal region, consistent with evolving acute right MCA watershed type area infarcts. Extension into the right basal ganglia. Additional evolving hypodensity involving the body and splenium of the corpus callosum, with extension into the subcortical white matter of the left frontal region, also compatible with infarct, more conspicuous as compared to previous exam. Evolving left cerebellar infarcts are grossly stable in distribution as compared to previous. Localized mass effect about the areas of infarction without significant regional mass effect. No evidence for hemorrhagic transformation. No other evidence for new or acute infarction. No acute intracranial hemorrhage. No mass lesion or midline shift. No hydrocephalus. No extra-axial fluid collection. Vascular: No hyperdense vessel. Skull: Scalp soft tissues and calvarium demonstrate no acute abnormality. Sinuses/Orbits: Globes and orbital soft tissues within normal limits. Scattered mucosal thickening throughout the paranasal sinuses with small bilateral mastoid effusions. Nasogastric tube in place. Other: None. IMPRESSION: 1. Continued interval evolution of subacute infarcts involving the right MCA cerebral white matter, corpus callosum/subcortical left frontal region, and left cerebellar hemisphere, overall relatively similar in distribution as compared to previous. No significant mass effect or evidence for hemorrhagic transformation. 2. No other new acute intracranial abnormality. Electronically Signed   By: Rise Mu M.D.   On: 02/07/2018 21:53   Ct Head Wo Contrast  Result Date: 02/03/2018 CLINICAL DATA:  47 y/o  M; post CVA with mental status change. EXAM: CT HEAD WITHOUT CONTRAST TECHNIQUE: Contiguous axial images were  obtained from the base of the skull through the vertex without intravenous contrast. COMPARISON:  01/25/2018 CT head FINDINGS: Brain: Several foci of hypoattenuation within right anterior insula as well as frontal, parietal, and temporal white matter are increased in conspicuity from the prior CT and probably represent areas of evolving acute infarction. Interval development of hypoattenuation throughout the mid and left aspects of the corpus callosum body and splenium which is new from the prior study and probably represents infarction. Infarction in the left cerebellum demonstrates a new region of hypoattenuation laterally and inferiorly (series 5, image 48) compatible with interval infarction. No hemorrhage or significant mass effect. Vascular: No hyperdense vessel or unexpected calcification. Skull: Normal. Negative for fracture or focal lesion. Sinuses/Orbits: No acute finding. Other: None. IMPRESSION: 1. Increasing or new regions of hypoattenuation scattered throughout right MCA distribution white matter, involving body and splenium of corpus callosum, and in the left lateral cerebellar hemisphere adjacent to prior PICA infarction likely representing interval areas of acute infarction. 2. No hemorrhage or mass effect.  No midline shift. These results will be called to the ordering clinician or representative by the Radiologist Assistant, and communication documented in the PACS or zVision Dashboard. Electronically Signed   By: Mitzi Hansen M.D.   On: 02/03/2018 21:43   Ct Head Wo Contrast  Addendum Date: 01/25/2018   ADDENDUM REPORT: 01/25/2018 08:43 ADDENDUM: Original report by Dr. Chase Picket. Addendum by Dr. Mosetta Putt: The study was reviewed via telephone with Dr. Pearlean Brownie on 01/25/2018 at 8:37 a.m. The patient has left hemiparesis, and there are new patchy hypodensities in the right corona radiata and right caudate nucleus consistent with acute infarcts. Electronically Signed   By: Sebastian Ache M.D.    On: 01/25/2018 08:43   Result Date: 01/25/2018 CLINICAL DATA:  Altered mental status EXAM: CT HEAD WITHOUT CONTRAST TECHNIQUE: Contiguous axial images were obtained from the base of the skull through the vertex without intravenous  contrast. COMPARISON:  01/23/2018 FINDINGS: Brain: Slight progression of cytotoxic edema at the left cerebellar infarct site. There is periventricular hypoattenuation compatible with chronic microvascular disease. No hemorrhage. Vascular: No hyperdense vessel or unexpected vascular calcification. Skull: Normal visualized skull base, calvarium and extracranial soft tissues. Sinuses/Orbits: No sinus fluid levels or advanced mucosal thickening. No mastoid effusion. Normal orbits. IMPRESSION: Slight progression of cytotoxic edema at the left cerebellar infarct site. No hemorrhage or other new acute finding. Electronically Signed: By: Deatra Robinson M.D. On: 01/25/2018 06:20   Ct Angio Neck W Or Wo Contrast  Result Date: 02/04/2018 CLINICAL DATA:  Altered mental status.  Recent CABG. EXAM: CT ANGIOGRAPHY HEAD AND NECK TECHNIQUE: Multidetector CT imaging of the head and neck was performed using the standard protocol during bolus administration of intravenous contrast. Multiplanar CT image reconstructions and MIPs were obtained to evaluate the vascular anatomy. Carotid stenosis measurements (when applicable) are obtained utilizing NASCET criteria, using the distal internal carotid diameter as the denominator. CONTRAST:  50mL ISOVUE-370 IOPAMIDOL (ISOVUE-370) INJECTION 76% COMPARISON:  CTA head neck 01/23/2018. FINDINGS: CTA NECK FINDINGS Aortic arch: There is no calcific atherosclerosis of the aortic arch. There is no aneurysm, dissection or hemodynamically significant stenosis of the visualized ascending aorta and aortic arch. Conventional 3 vessel aortic branching pattern. The visualized proximal subclavian arteries are widely patent. Right carotid system: Moderate narrowing of the distal  right common carotid artery, measuring 50%. There is mild atherosclerotic calcification at the carotid bifurcation. No hemodynamically significant stenosis of the remainder of the cervical right internal carotid artery. Left carotid system: The left common carotid origin is widely patent. There is no common carotid or internal carotid artery dissection or aneurysm. Mild atherosclerotic calcification at the carotid bifurcation without hemodynamically significant stenosis. Vertebral arteries: The vertebral system is right dominant. There is narrowing of the right vertebral artery origin due to atherosclerotic calcification. The remainder of the right vertebral artery is normal. Normal left vertebral artery. There is no bony spinal canal stenosis.  No lytic or blastic lesion. Other neck: Endotracheal tube tip is at the level of clavicular heads. Upper chest: Large left pleural effusion, incompletely visualized. Review of the MIP images confirms the above findings CTA HEAD FINDINGS Anterior circulation: --Intracranial internal carotid arteries: There is atherosclerotic calcification of both internal carotid arteries at the skull base. There is severe stenosis of the right ICA at the proximal lacerum segment. There is severe left ICA stenosis at the distal cavernous and clinoid segment. These findings are unchanged. --Anterior cerebral arteries: Normal. --Middle cerebral arteries: Unchanged moderate-to-severe stenosis of the mid M1 segment of the right middle cerebral artery. Normal left MCA. --Posterior communicating arteries: Absent bilaterally. Posterior circulation: --Basilar artery: Normal. --Posterior cerebral arteries: Normal. --Superior cerebellar arteries: Normal. --Inferior cerebellar arteries: Normal posterior inferior cerebellar arteries. Anterior inferior cerebellar arteries are not clearly visible, but this is not uncommon. Venous sinuses: As permitted by contrast timing, patent. Anatomic variants: None  Delayed phase: No parenchymal contrast enhancement. Review of the MIP images confirms the above findings. IMPRESSION: 1. No emergent large vessel occlusion. 2. 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 3. Approximately 50% stenosis of the distal right common carotid artery, unchanged. 4. Large left pleural effusion. Electronically Signed   By: Deatra Robinson M.D.   On: 02/04/2018 01:42   Ct Angio Neck W Or Wo Contrast  Result Date: 01/23/2018 CLINICAL DATA:  Initial evaluation  for lethargy, difficulty to wake up. Status post CABG. EXAM: CT ANGIOGRAPHY HEAD AND NECK TECHNIQUE: Multidetector CT imaging of the head and neck was performed using the standard protocol during bolus administration of intravenous contrast. Multiplanar CT image reconstructions and MIPs were obtained to evaluate the vascular anatomy. Carotid stenosis measurements (when applicable) are obtained utilizing NASCET criteria, using the distal internal carotid diameter as the denominator. CONTRAST:  50mL ISOVUE-370 IOPAMIDOL (ISOVUE-370) INJECTION 76% COMPARISON:  Prior head CT from 01/02/2017. FINDINGS: CT HEAD FINDINGS Brain: Cerebral volume within normal limits for age. Scattered patchy hypodensity within the subcortical white matter of the right cerebral hemisphere most likely related to remote right MCA territory infarct. No acute intracranial hemorrhage. Parenchymal hypodensity within the inferior left cerebellar hemisphere consistent with acute ischemic infarct, left PICA territory. No other acute large vessel territory infarct. No mass lesion, midline shift or mass effect. No hydrocephalus. No extra-axial fluid collection. Vascular: No hyperdense vessel. Skull: Scalp soft tissues and calvarium within normal limits. Sinuses: Paranasal sinuses are clear. No mastoid effusion. Orbits: Globes and oval soft tissues within normal limits. Review of the  MIP images confirms the above findings CTA NECK FINDINGS Aortic arch: Visualized aortic arch of normal caliber with normal branch pattern. Sequelae of recent CABG noted. No flow-limiting stenosis about the origin of the great vessels. Visualized subclavian arteries widely patent. Right carotid system: Right common carotid artery widely patent proximally. There is moderate atheromatous narrowing of approximately 50% at the distal right common carotid artery (series 12, image 228). A centric max plaque about the proximal right ICA with associated mild narrowing of approximately 35% by NASCET criteria. Right ICA widely patent distally to the skull base without stenosis, dissection, or occlusion. Left carotid system: Left common carotid artery patent from its origin to the bifurcation without stenosis. Mild calcified atheromatous plaque about the left bifurcation without flow-limiting stenosis. Left ICA patent distally to the skull base without flow-limiting stenosis. Vertebral arteries: Both of the vertebral arteries arise from the subclavian arteries. Calcified plaque at the origin of the vertebral arteries bilaterally with approximately 70% stenosis on the right and 50% stenosis on the left. Multifocal atheromatous irregularity seen throughout the vertebral arteries within the neck but remain patent to the skull base. Skeleton: No acute osseous abnormality. No worrisome lytic or blastic osseous lesions. Recent sternotomy noted. Other neck: No acute soft tissue abnormality within the neck. Right IJ approach central venous catheter. Upper chest: Sequelae of recent CABG. Layering bilateral pleural effusions with associated atelectatic changes noted. Partially visualized lungs are otherwise clear. Review of the MIP images confirms the above findings CTA HEAD FINDINGS Anterior circulation: Petrous segments patent bilaterally. There is extensive calcified atheromatous irregularity throughout the cavernous ICAs bilaterally,  slightly progressive as compared to previous. Diameter at the proximal cavernous right ICA measures approximately 2 mm, similar to previous. Focal stenosis at the proximal cavernous left ICA slightly worsened now measuring approximately 2 mm as well. Distal cavernous left ICA also measures approximately 2 mm, similar to previous. ICAs are patent to the termini. A1 segments irregular but patent. Patent anterior communicating artery. Anterior cerebral arteries patent to their distal aspects. Approximate moderate 50% stenosis at the proximal left M1 segment. Left M1 patent distally. Normal left MCA bifurcation. Distal left MCA branches perfuse. Distal small vessel atheromatous irregularity present throughout the MCA branches bilaterally. There is a focal severe near occlusive stenosis at the proximal right M1 segment, worsened from previous. While subocclusive thrombus would be difficult to exclude, this  is less favored (series 12, image 99). Right M1 segment patent distally. No proximal right M2 occlusion. Distal right MCA branches well perfused. Posterior circulation: Moderate stenosis of the left vertebral artery as it crosses into the cranial vault. There is additional severe stenosis of the left V4 segment distally beyond the takeoff of the PICA. Left PICA is patent proximally. Approximate 50% stenosis within the right V4 segment prior to the vertebrobasilar junction. Right PICA patent as well. Basilar artery demonstrates multifocal atheromatous irregularity but is patent to its distal aspect. Superior cerebral arteries patent bilaterally. Both of the posterior cerebral arteries primarily supplied via the basilar. PCAs irregular with multifocal moderate to severe stenoses but are patent to their distal aspects. Venous sinuses: Patent. Anatomic variants: None significant.  No aneurysm. Delayed phase: No abnormal enhancement. Review of the MIP images confirms the above findings IMPRESSION: 1. Negative CTA for large  vessel occlusion. 2. 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 Left PICA is patent proximally. 3. 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 While subocclusive thrombus would be difficult to exclude, this is felt to be less likely. Query an episode of recent hypotension, which superimposed on the underlying stenosis could result in patient's left-sided symptoms. 4. Advanced atherosclerotic change affecting the cavernous ICAs bilaterally, left greater than right, slightly progressed from previous. 5. Short-segment 50% proximal left M1 stenosis. 6. Additional advanced for age diffuse atherosclerotic change throughout the extracranial intracranial circulation as above. Critical Value/emergent results were called by telephone at the time of interpretation on 01/23/2018 at 3:51 pm to Dr. Arther Dames, who verbally acknowledged these results. Electronically Signed   By: Rise Mu M.D.   On: 01/23/2018 16:19   Ct Chest Wo Contrast  Result Date: 02/05/2018 CLINICAL DATA:  Pleural effusion. 14 days postop from coronary artery bypass graft. Intubated unresponsive. EXAM: CT CHEST WITHOUT CONTRAST TECHNIQUE: Multidetector CT imaging of the chest was performed following the standard protocol without IV contrast. COMPARISON:  None. FINDINGS: Cardiovascular: The heart is enlarged. Trace pericardial fluid. Status post CABG. Atherosclerotic calcification is noted in the wall of the thoracic aorta. Mediastinum/Nodes: No mediastinal lymphadenopathy. No evidence for gross hilar lymphadenopathy although assessment is limited by the lack of intravenous contrast on today's study. The esophagus has normal imaging features. There is no axillary lymphadenopathy. Lungs/Pleura: Endotracheal tube tip positioned in the distal trachea. Low lung volumes with left lower lobe collapse/consolidation and moderate left pleural effusion. More modest  collapse/consolidative change in the right lower lobe. No evidence for overt pulmonary edema. Upper Abdomen: Feeding tube is visualized coursing through the gastric lumen although the distal tip has not been included on the study. Musculoskeletal: Bone windows reveal no worrisome lytic or sclerotic osseous lesions. IMPRESSION: 1. Left lower lobe collapse/consolidation with moderate left pleural effusion. 2. Mild atelectasis/infiltrate right lower lobe. Electronically Signed   By: Kennith Center M.D.   On: 02/05/2018 14:51   Mr Laqueta Jean VW Contrast  Result Date: 02/12/2018 CLINICAL DATA:  Altered mental status and recent CABG. EXAM: MRI HEAD WITHOUT AND WITH CONTRAST TECHNIQUE: Multiplanar, multiecho pulse sequences of the brain and surrounding structures were obtained without and with intravenous contrast. CONTRAST:  20mL MULTIHANCE GADOBENATE DIMEGLUMINE 529 MG/ML IV SOLN COMPARISON:  Head CT 02/07/2018 MRI brain 12/31/2016 FINDINGS: Brain: The midline structures are normal. There are numerous scattered foci of abnormal diffusion restriction. This includes confluent areas of abnormality in the corpus callosum and right centrum semiovale. There are  multiple small foci in the brainstem. Large area in the left cerebellum with smaller foci in the right cerebellum, both temporal lobes and the right parietal lobe. There is moderate edema of the corpus callosum and pericallosal white matter. No midline shift. There is moderate contrast enhancement within the left cerebellum, corpus callosum and pericallosal white matter. Small focus of contrast enhancement in the right insula. The brain parenchymal signal is normal for the patient's age. No age-advanced or lobar predominant atrophy. No chronic microhemorrhage or superficial siderosis. Vascular: Major intracranial arterial and venous sinus flow voids are preserved. Skull and upper cervical spine: The visualized skull base, calvarium, upper cervical spine and extracranial  soft tissues are normal. Sinuses/Orbits: Diffuse paranasal sinus opacification bilateral mastoid effusions. Normal orbits. IMPRESSION: 1. Widespread areas of subacute infarction, in unchanged distribution from the CT of 02/07/2018, predominantly involving the corpus callosum and pericallosal white matter, right MCA territory white matter and left cerebellum. 2. There is no herniation or mass effect.  No acute hemorrhage. Electronically Signed   By: Deatra Robinson M.D.   On: 02/12/2018 14:43   Ct Cerebral Perfusion W Contrast  Result Date: 01/23/2018 CLINICAL DATA:  Initial evaluation for focal neural deficit, stroke suspected. EXAM: CT PERFUSION BRAIN TECHNIQUE: Multiphase CT imaging of the brain was performed following IV bolus contrast injection. Subsequent parametric perfusion maps were calculated using RAPID software. CONTRAST:  50mL ISOVUE-370 IOPAMIDOL (ISOVUE-370) INJECTION 76% COMPARISON:  Prior CTA from earlier the same day. FINDINGS: CT Brain Perfusion Findings: CBF (<30%) Volume: 0mL Perfusion (Tmax>6.0s) volume: 0mL Mismatch Volume: 0mL Infarction Location:None. Scattered areas of elevated T max greater than 4 seconds within the right watershed distribution, likely related to previous identified right M1 stenosis. IMPRESSION: No evidence for acute ischemia by CT perfusion. Electronically Signed   By: Rise Mu M.D.   On: 01/23/2018 19:53   Dg Chest Port 1 View  Result Date: 02/13/2018 CLINICAL DATA:  Respiratory failure. EXAM: PORTABLE CHEST 1 VIEW COMPARISON:  Chest x-ray from yesterday. FINDINGS: Endotracheal and enteric tubes are unchanged. Stable left upper extremity PICC line. Stable cardiomegaly status post CABG. Normal pulmonary vascularity. Unchanged left lower lobe consolidation and adjacent small left pleural effusion. The right lung remains clear. IMPRESSION: Stable left lower lobe consolidation and adjacent pleural effusion. Electronically Signed   By: Obie Dredge M.D.    On: 02/13/2018 07:40   Dg Chest Port 1 View  Result Date: 02/12/2018 CLINICAL DATA:  Status post coronary bypass grafting EXAM: PORTABLE CHEST 1 VIEW COMPARISON:  02/10/2018 FINDINGS: Postsurgical changes are noted. Endotracheal tube, nasogastric catheter and left-sided PICC line are again seen and stable. The right lung remains clear. Left basilar infiltrate and effusion is again identified and stable. No new focal abnormality is seen. IMPRESSION: Stable left basilar changes. Electronically Signed   By: Alcide Clever M.D.   On: 02/12/2018 08:46   Dg Chest Port 1 View  Result Date: 02/10/2018 CLINICAL DATA:  Status post CABG on January 22, 2018. EXAM: PORTABLE CHEST 1 VIEW COMPARISON:  Portable chest x-ray of March 19th and March 18th 2019 FINDINGS: The lungs are mildly hypoinflated. There is persistent increased density in the left retrocardiac region with obscuration of the left hemidiaphragm. The interstitial markings elsewhere both lungs are coarse but slightly less conspicuous today. The cardiac silhouette is enlarged. The pulmonary vascularity is less engorged and more distinct. The esophagogastric tube tip in proximal port project below the expected location of the GE junction. The endotracheal tube tip lies approximately  6.4 cm above the carina at the inferior margin of the clavicular heads. The left-sided PICC line tip projects over the midportion of the SVC. IMPRESSION: Persistent left lower lobe atelectasis or pneumonia with small left pleural effusion. Mild improvement in the appearance of the pulmonary interstitium bilaterally with decreased pulmonary vascular congestion consistent with improving CHF. The support tubes are in reasonable position. Electronically Signed   By: David  [place] M.D.   On: 02/10/2018 09:00   Dg Chest Port 1 View  Result Date: 02/09/2018 CLINICAL DATA:  Endotracheal tube position EXAM: PORTABLE CHEST 1 VIEW COMPARISON:  02/08/2018 FINDINGS: Endotracheal tube in good  position. Gastric tube enters the stomach. Left arm PICC tip in the SVC parent Moderate left lower lobe airspace disease and small left effusion unchanged. Progression of right lower lobe airspace disease. No effusion on the right IMPRESSION: Endotracheal tube in good position Progressive right lower lobe infiltrate/atelectasis Left lower lobe consolidation unchanged. Electronically Signed   By: Marlan Palau M.D.   On: 02/09/2018 07:58   Dg Chest Port 1 View  Result Date: 02/08/2018 CLINICAL DATA:  Ventilator dependence. EXAM: PORTABLE CHEST 1 VIEW COMPARISON:  02/07/2018 FINDINGS: 0547 hours. Endotracheal tube tip is 6.6 cm above the base the carina. The NG tube passes into the stomach although the distal tip position is not included on the film. Left-sided central line tip overlies the distal SVC. Cardiopericardial silhouette is markedly enlarged. Lung volumes low. Retrocardiac collapse/consolidation persists. Telemetry leads overlie the chest. IMPRESSION: No substantial change. Cardiomegaly with left base collapse/consolidation. Electronically Signed   By: Kennith Center M.D.   On: 02/08/2018 08:36   Dg Chest Port 1 View  Result Date: 02/07/2018 CLINICAL DATA:  Chest tube in place. EXAM: PORTABLE CHEST 1 VIEW COMPARISON:  Chest radiograph 02/06/2018. FINDINGS: ET tube terminates at the thoracic inlet, consider advancement. Enteric tube courses inferior to the diaphragm. Left upper extremity PICC line tip projects over the superior vena cava. Stable cardiomegaly status post median sternotomy. Persistent moderate left pleural effusion with underlying opacities. No pneumothorax. IMPRESSION: ET tube terminates at the thoracic inlet, consider advancement. Persistent small left pleural effusion and underlying opacities which may represent atelectasis or infection. These results will be called to the ordering clinician or representative by the Radiologist Assistant, and communication documented in the PACS or  zVision Dashboard. Electronically Signed   By: Annia Belt M.D.   On: 02/07/2018 07:53   Dg Chest Port 1 View  Result Date: 02/06/2018 CLINICAL DATA:  Status post CABG. EXAM: PORTABLE CHEST 1 VIEW COMPARISON:  02/05/2018. FINDINGS: Cardiomegaly. Recent CABG. Patient remains intubated with ET tube and orogastric tube. PICC line tip from LEFT arm approach lies the cavoatrial junction. There is slight improvement in aeration in the LEFT hemithorax. Decreasing edema, in consolidation, with persistent LEFT lower lobe opacity suggesting atelectasis and effusion. IMPRESSION: Improved aeration.  Persistent retrocardiac density. Electronically Signed   By: Elsie Stain M.D.   On: 02/06/2018 07:19   Dg Chest Port 1 View  Result Date: 02/05/2018 CLINICAL DATA:  Ventilator support. EXAM: PORTABLE CHEST 1 VIEW COMPARISON:  02/04/2018 FINDINGS: Endotracheal tube tip is 5 cm above the carina. Soft feeding tube enters the abdomen. Left arm PICC tip at the SVC RA junction. There is worsened infiltrate and volume loss in the left lung, probably with an associated left effusion. Right chest remains largely clear. IMPRESSION: Worsening appearance on the left with increasing infiltrate, volume loss and effusion. Electronically Signed   By: Loraine Leriche  Shogry M.D.   On: 02/05/2018 08:55   Dg Chest Port 1 View  Result Date: 02/04/2018 CLINICAL DATA:  Endotracheal tube placement. EXAM: PORTABLE CHEST 1 VIEW COMPARISON:  Chest radiograph performed 02/03/2018 FINDINGS: The patient's endotracheal tube is seen ending 4-5 cm above the carina. An enteric tube is noted extending below the diaphragm. A small left pleural effusion is noted. Left basilar airspace opacity may reflect pneumonia. No pneumothorax is seen. The cardiomediastinal silhouette is mildly enlarged. The patient is status post median sternotomy, with evidence of prior CABG. No acute osseous abnormalities are identified. IMPRESSION: 1. Endotracheal tube seen ending 4-5 cm  above the carina. 2. Small left pleural effusion. Left basilar airspace opacity may reflect pneumonia. Electronically Signed   By: Roanna Raider M.D.   On: 02/04/2018 01:10   Dg Chest Port 1 View  Result Date: 02/03/2018 CLINICAL DATA:  Status post CABG EXAM: PORTABLE CHEST 1 VIEW COMPARISON:  01/27/2018 FINDINGS: Feeding tube reaches the stomach at least. Left upper extremity PICC with tip at the upper cavoatrial junction. Unchanged cardiopericardial enlargement. Haziness at the left base. No pneumothorax or generalized Kerley lines. IMPRESSION: 1. Low volume chest with retrocardiac opacity favoring atelectasis and pleural fluid. 2. Cardiopericardial enlargement. 3. Stable compared to 01/27/2018 Electronically Signed   By: Marnee Spring M.D.   On: 02/03/2018 08:08   Dg Chest Port 1 View  Result Date: 01/27/2018 CLINICAL DATA:  Status post CABG EXAM: PORTABLE CHEST 1 VIEW COMPARISON:  Two days ago FINDINGS: Left upper extremity PICC with tip at the upper cavoatrial junction. Stable cardiopericardial enlargement. Low lung volumes with asymmetric opacity at the left base. No Kerley lines or pneumothorax. IMPRESSION: 1. Stable compared to yesterday. 2. Low volume chest with left base opacity favoring atelectasis and pleural fluid. Electronically Signed   By: Marnee Spring M.D.   On: 01/27/2018 07:07   Dg Chest Port 1 View  Result Date: 01/25/2018 CLINICAL DATA:  Status post CABG on January 22, 2018 EXAM: PORTABLE CHEST 1 VIEW COMPARISON:  Portable chest x-ray of January 24, 2018 FINDINGS: The lungs are borderline hypoinflated. There is persistent increased density at the left base with obscuration of the hemidiaphragm. The cardiac silhouette remains enlarged. The pulmonary vascularity is mildly prominent. The sternal wires are intact. The left-sided PICC line tip projects over the midportion of the SVC. IMPRESSION: Persistent mild CHF. Small left pleural effusion and mild left basilar atelectasis. No  pneumothorax. Electronically Signed   By: David  [place] M.D.   On: 01/25/2018 07:17   Dg Chest Port 1 View  Result Date: 01/24/2018 CLINICAL DATA:  47 y/o  male postop day 2 status post CABG. EXAM: PORTABLE CHEST 1 VIEW COMPARISON:  01/23/2018 and earlier. FINDINGS: Portable AP semi upright view at 0447 hours. Right IJ Swan-Ganz catheter removed, the introducer sheath remains. Left chest tube, and mediastinal tube removed. Left PICC line placed tip at the cavoatrial junction level. No pneumothorax or pulmonary edema. Stable cardiac size and mediastinal contours. Continued dense left lung base opacification, although might be in part related to elevated left hemidiaphragm. The right lung remains clear allowing for portable technique. Negative visible bowel gas pattern. IMPRESSION: 1. Swan-Ganz catheter, mediastinal tube and left chest tube removed. Left PICC line placed. 2. No pneumothorax or pulmonary edema. 3. Confluent left lung base opacity likely due to atelectasis +/- pleural effusion. Electronically Signed   By: Odessa Fleming M.D.   On: 01/24/2018 07:13   Dg Chest [facility] 1 View  Result  Date: 01/23/2018 CLINICAL DATA:  47 year old male status status post CABG postop day 1. EXAM: PORTABLE CHEST 1 VIEW COMPARISON:  01/26/2018 and earlier. FINDINGS: Portable AP semi upright view at 0529 hours. Extubated. Enteric tube removed. right IJ Swan-Ganz catheter re-demonstrated, tip now at the right main pulmonary artery level. Stable left chest tube and mediastinal tube. Mildly lower lung volumes. Obscuration of the left hemidiaphragm. No pneumothorax. Probable small left pleural effusion. No pulmonary edema. Stable cardiac size and mediastinal contours. Negative visible bowel gas pattern. IMPRESSION: 1. Extubated and enteric tube removed. Otherwise stable lines and tubes. 2. Mildly lower lung volumes with increased left lung base atelectasis. Probable small left pleural effusion. 3. No pneumothorax or pulmonary edema.  Electronically Signed   By: Odessa Fleming M.D.   On: 01/23/2018 07:23   Dg Chest Port 1 View  Result Date: 02/12/2018 CLINICAL DATA:  Status post CABG. EXAM: PORTABLE CHEST 1 VIEW COMPARISON:  01/20/2018 FINDINGS: The endotracheal tube tip is above the carina. A enteric tube is noted with tip below the field of view. Swan-Ganz catheter tip is in the right pulmonary artery. Mediastinal drain and left chest tube noted. No pneumothorax visualized. Atelectasis within the left midlung adjacent to chest tube noted. IMPRESSION: 1. Support apparatus positioned as above. 2. Left midlung atelectasis. 3. Left chest tube without pneumothorax. Electronically Signed   By: Signa Kell M.D.   On: 02/18/2018 15:10   Dg Abd Portable 1v  Result Date: 02/06/2018 CLINICAL DATA:  Nasogastric tube placement EXAM: PORTABLE ABDOMEN - 1 VIEW COMPARISON:  January 29, 2018 FINDINGS: Nasogastric tube tip is in the proximal stomach with the side port at the gastroesophageal junction. There is moderate stool in the visualized colon. There is no appreciable bowel dilatation or air-fluid level to suggest bowel obstruction. No evident free air. Temporary pacemaker leads are attached to the right heart. IMPRESSION: Nasogastric tube tip in proximal stomach with side port at gastroesophageal junction. Advise advancing nasogastric tube approximately 6 cm to insure that both tube tip and side port are well within the stomach. No bowel obstruction or free air evident. Moderate stool in the colon. Electronically Signed   By: Bretta Bang III M.D.   On: 02/06/2018 23:47   [place] Ekg Site Rite  Result Date: 01/23/2018 If Site Rite image not attached, placement could not be confirmed due to current cardiac rhythm.   Microbiology No results found for this or any previous visit (from the past 240 hour(s)).  Lab Basic Metabolic Panel: Recent Labs  Lab 02/13/18 0430  NA 142  K 3.9  CL 108  CO2 28  GLUCOSE 131*  BUN 45*  CREATININE 0.96   CALCIUM 8.0*   Liver Function Tests: Recent Labs  Lab 02/13/18 0430  AST 30  ALT 36  ALKPHOS 77  BILITOT 0.5  PROT 6.2*  ALBUMIN 1.7*   No results for input(s): LIPASE, AMYLASE in the last 168 hours. No results for input(s): AMMONIA in the last 168 hours. CBC: Recent Labs  Lab 02/13/18 0430  WBC 13.6*  HGB 6.8*  HCT 23.4*  MCV 97.9  PLT 394   Cardiac Enzymes: No results for input(s): CKTOTAL, CKMB, CKMBINDEX, TROPONINI in the last 168 hours. Sepsis Labs: Recent Labs  Lab 02/13/18 0430  WBC 13.6*    Procedures/Operations  See above. CABGx4 FEES swallow eval 2 D echo EEG Wayne E Gold 02/19/2018, 1:40 PM

## 2018-02-22 NOTE — Progress Notes (Signed)
Wasted 85ml of morphine and 35ml of ativan down the sink with Ivan AnchorsJesse RN.

## 2018-02-22 DEATH — deceased

## 2018-04-26 ENCOUNTER — Encounter

## 2018-04-26 ENCOUNTER — Ambulatory Visit: Payer: BLUE CROSS/BLUE SHIELD | Admitting: Neurology

## 2019-08-22 IMAGING — CR DG CHEST 2V
2 series · 2 of 2 positions shown · non-contrast
Comparison: 11/09/2017

CLINICAL DATA: Shortness of breath.

EXAM:
CHEST  2 VIEW

[w chest pa]
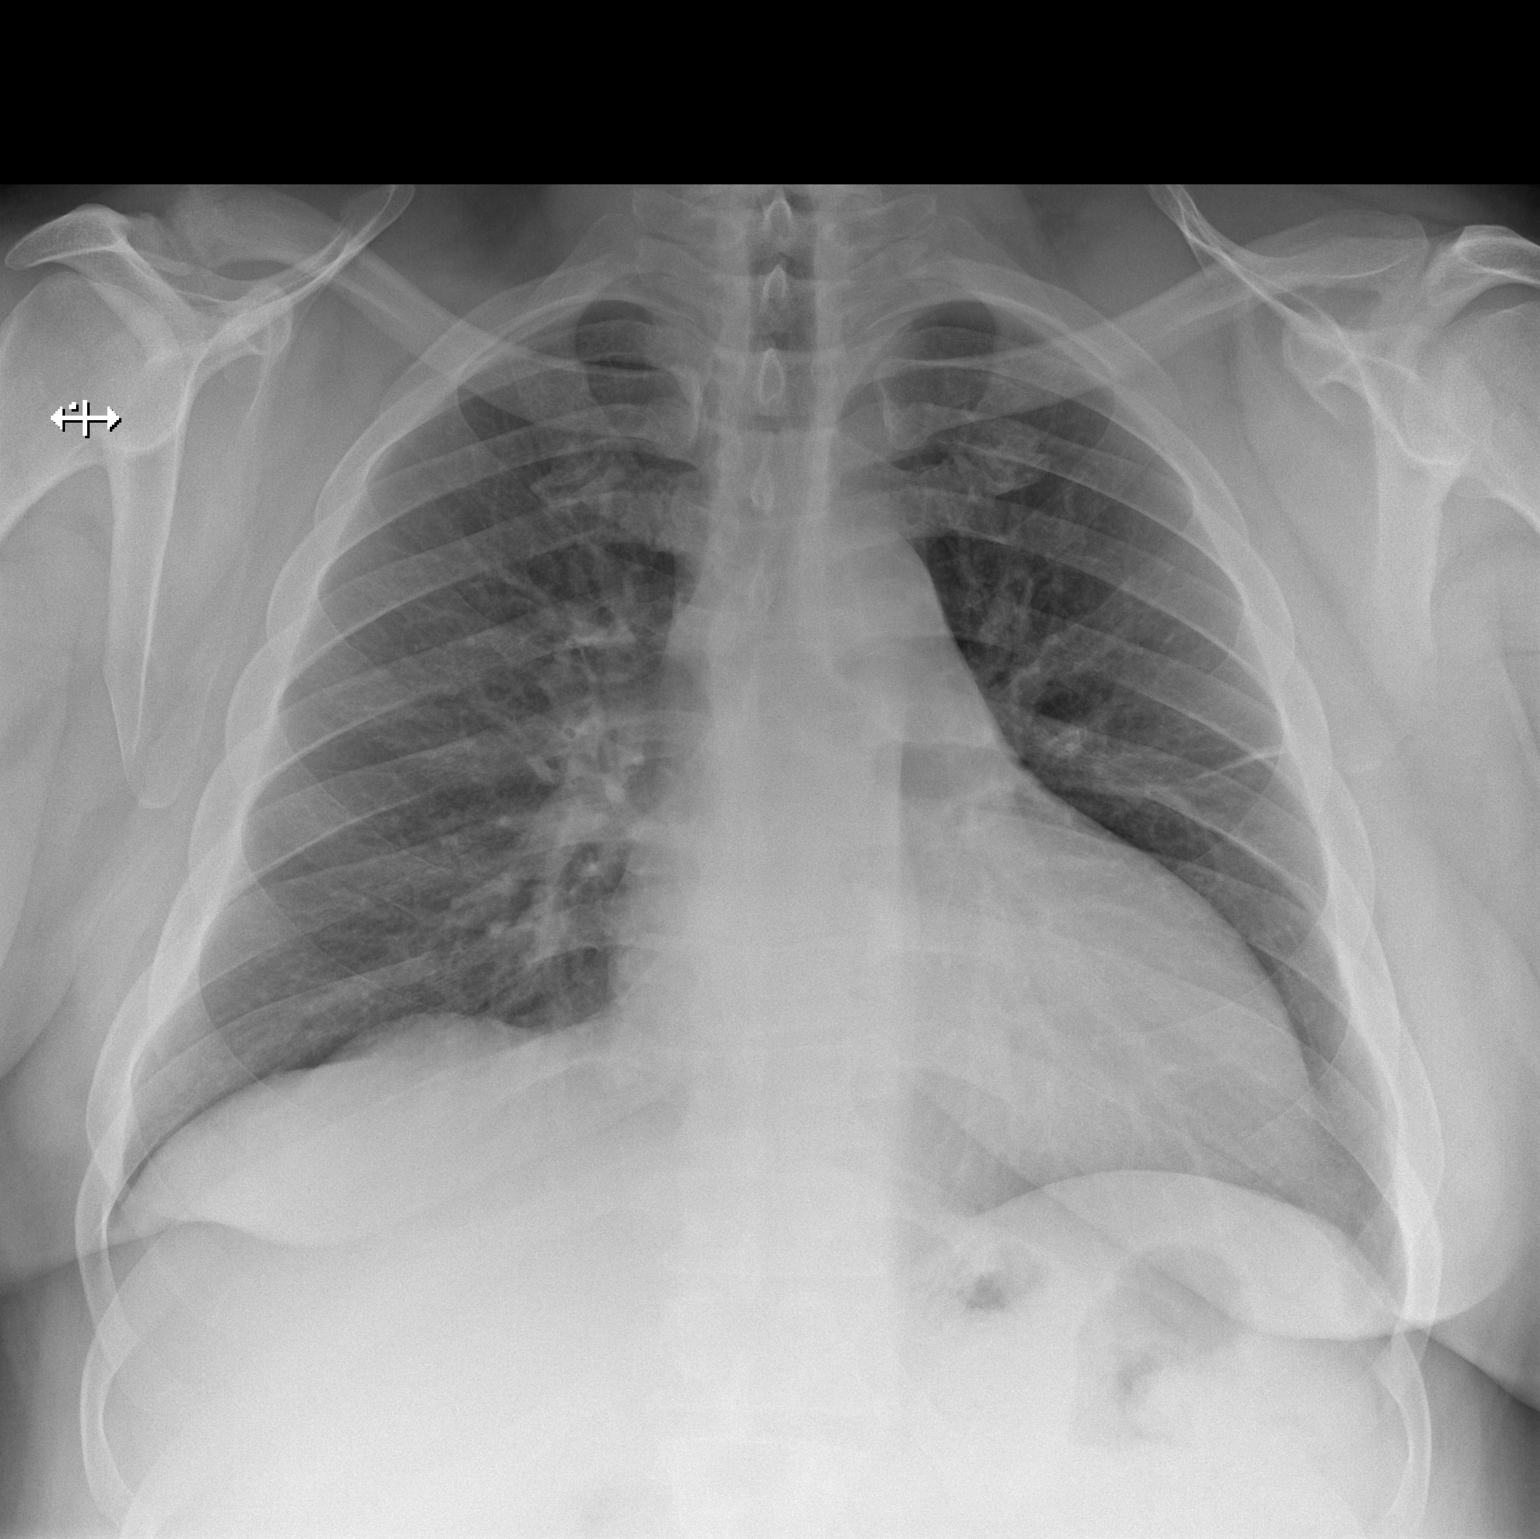

[w chest lat]
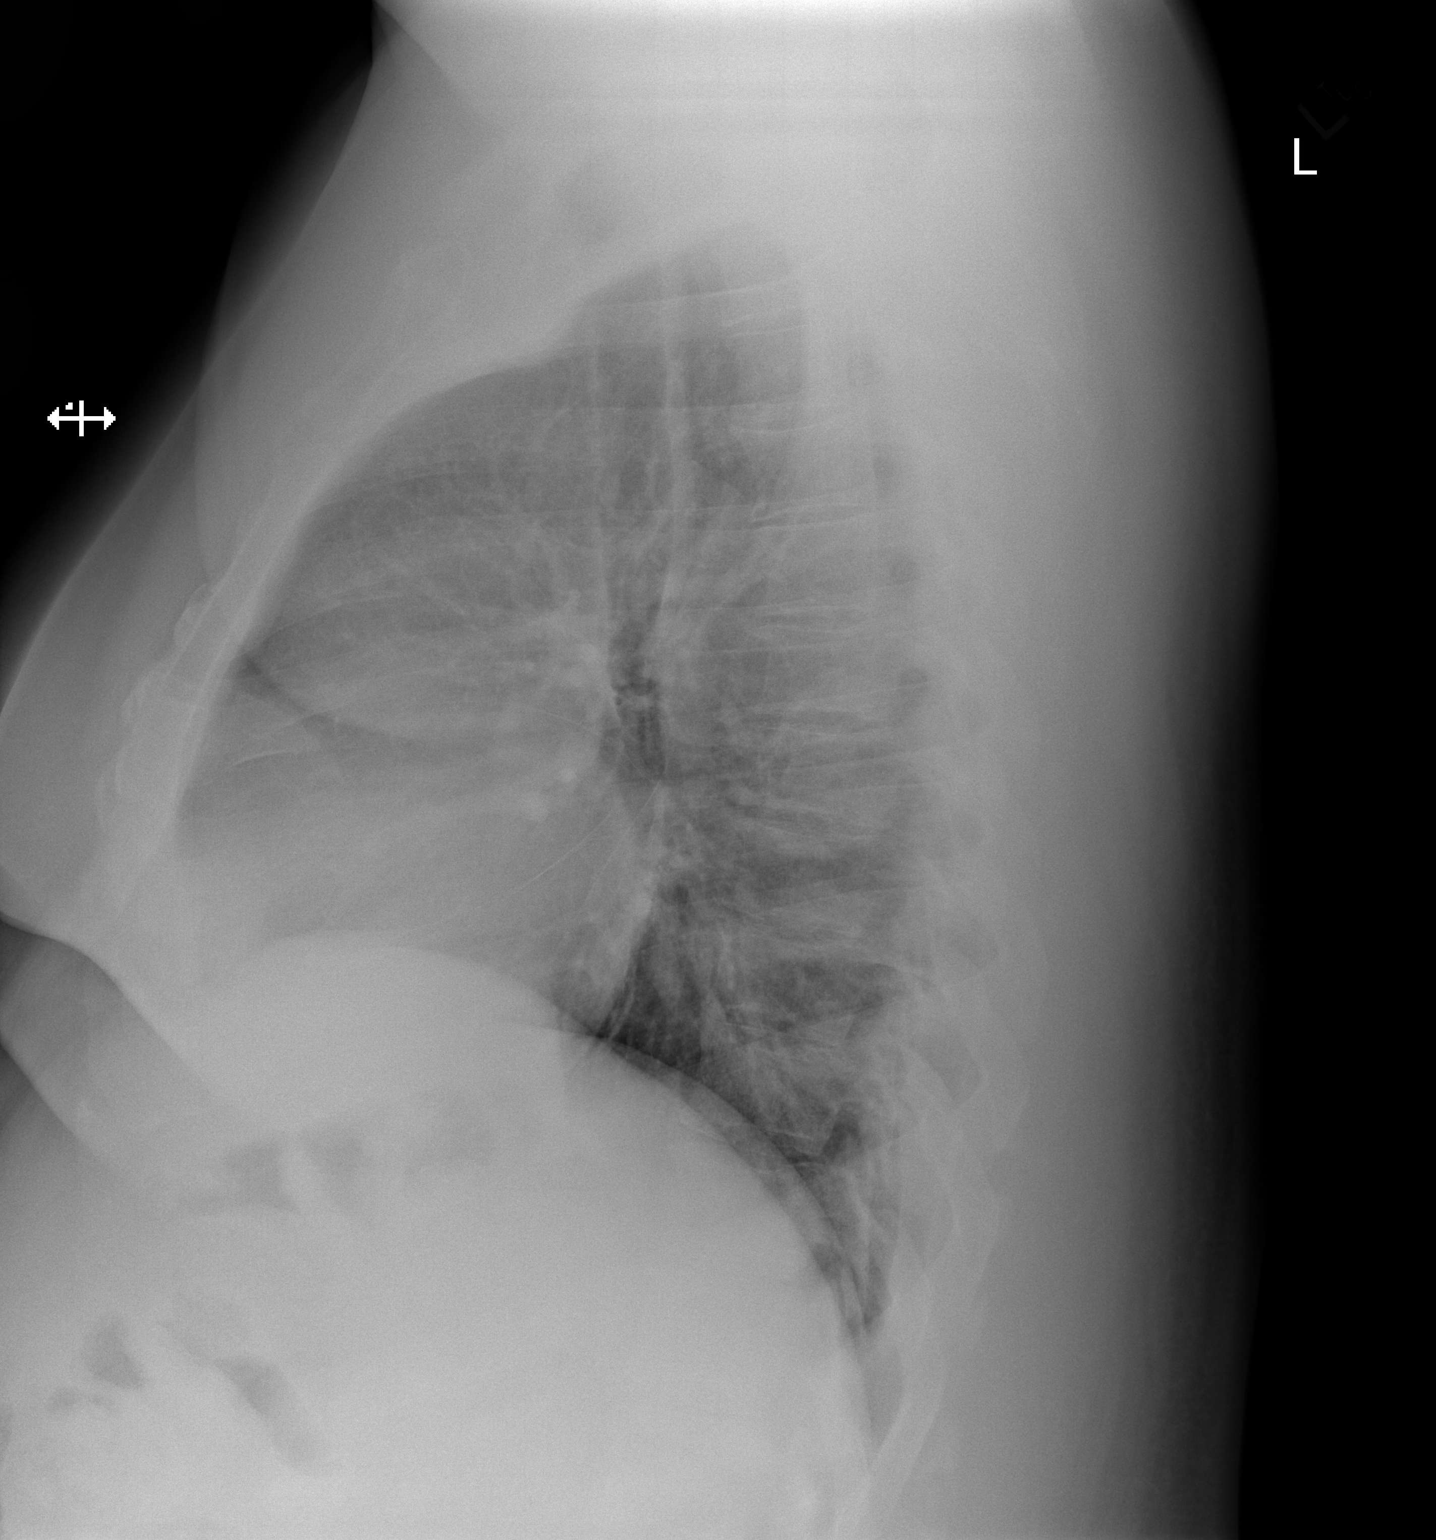

[2 of 2 positions shown; findings below may reference images not displayed]

FINDINGS: The heart is enlarged but appears stable. The mediastinal and hilar
contours are within normal limits and unchanged. Mild central
vascular congestion but no overt pulmonary edema. No pleural
effusions. Stable linear scarring changes in the left mid lung. The
bony thorax is intact.
IMPRESSION: Cardiac enlargement and mild central vascular congestion but no
overt pulmonary edema or pleural effusions.

## 2019-08-25 IMAGING — CT CT ANGIO NECK
1 of 12 series · 5 of 33 positions shown · IV contrast (iopamidol)
Comparison: Prior head CT from 01/02/2017.

CLINICAL DATA: Initial evaluation for lethargy, difficulty to [place]
up. Status post CABG.

EXAM:
CT ANGIOGRAPHY HEAD AND NECK
TECHNIQUE: Multidetector CT imaging of the head and neck was performed using
the standard protocol during bolus administration of intravenous
contrast. Multiplanar CT image reconstructions and MIPs were
obtained to evaluate the vascular anatomy. Carotid stenosis
measurements (when applicable) are obtained utilizing NASCET
criteria, using the distal internal carotid diameter as the
denominator.
CONTRAST:  50mL GINUS0-YU7 IOPAMIDOL (GINUS0-YU7) INJECTION 76%

[Series 12: ax thins · axial · 0.50mm/px · z∈[-290,-49]mm · 5 of 364 slices shown]
[im 61/364  soft-tissue]
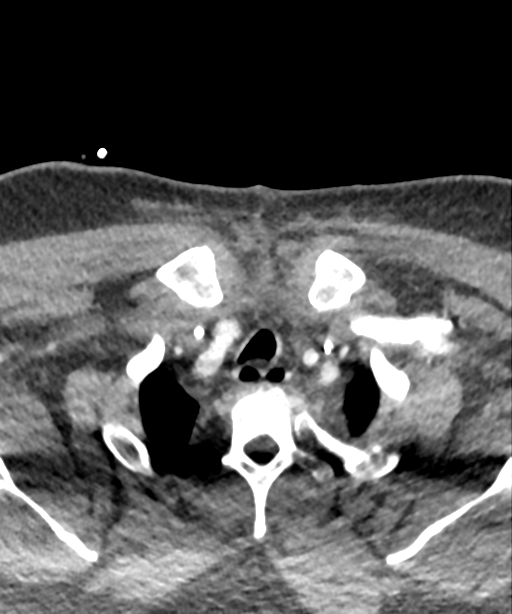
[im 122/364  bone]
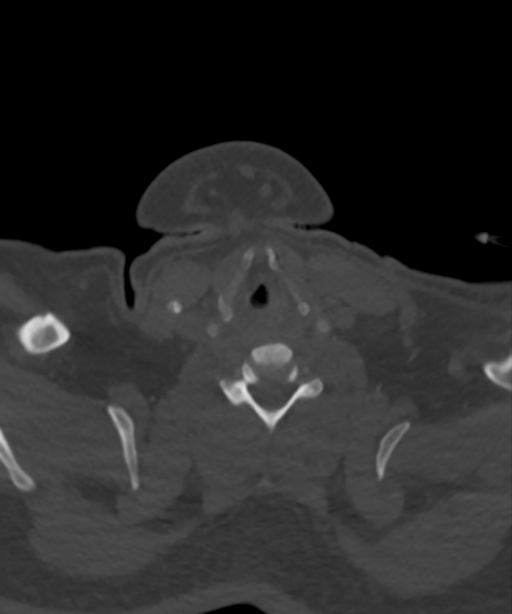
[im 182/364  soft-tissue]
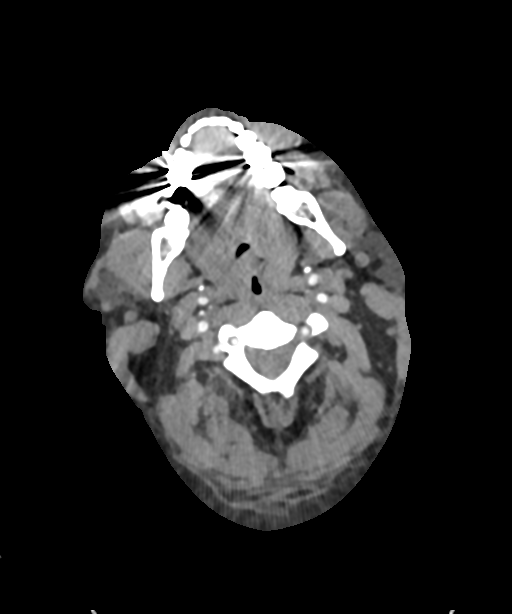
[im 243/364  bone]
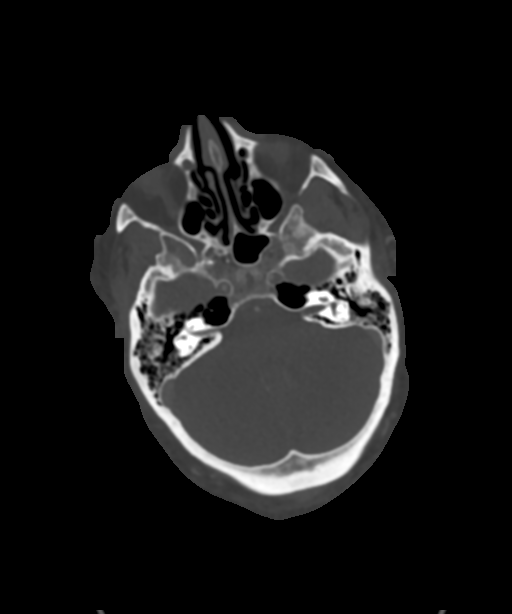
[im 303/364  soft-tissue]
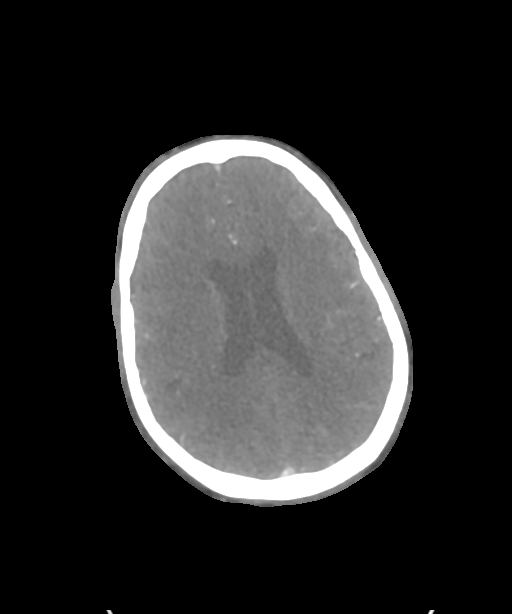

[5 of 33 positions shown; findings below may reference images not displayed]

FINDINGS: CT HEAD FINDINGS

Brain: Cerebral volume within normal limits for age. Scattered
patchy hypodensity within the subcortical white matter of the right
cerebral hemisphere most likely related to remote right MCA
territory infarct.

No acute intracranial hemorrhage. Parenchymal hypodensity within the
inferior left cerebellar hemisphere consistent with acute ischemic
infarct, left PICA territory. No other acute large vessel territory
infarct. No mass lesion, midline shift or mass effect. No
hydrocephalus. No extra-axial fluid collection.

Vascular: No hyperdense vessel.

Skull: Scalp soft tissues and calvarium within normal limits.

Sinuses: Paranasal sinuses are clear. No mastoid effusion.

Orbits: Globes and oval soft tissues within normal limits.

Review of the MIP images confirms the above findings

CTA NECK FINDINGS

Aortic arch: Visualized aortic arch of normal caliber with normal
branch pattern. Sequelae of recent CABG noted. No flow-limiting
stenosis about the origin of the great vessels. Visualized
subclavian arteries widely patent.

Right carotid system: Right common carotid artery widely patent
proximally. There is moderate atheromatous narrowing of
approximately 50% at the distal right common carotid artery (series
12, image 228). A centric max plaque about the proximal right ICA
with associated mild narrowing of approximately 35% by NASCET
criteria. Right ICA widely patent distally to the skull base without
stenosis, dissection, or occlusion.

Left carotid system: Left common carotid artery patent from its
origin to the bifurcation without stenosis. Mild calcified
atheromatous plaque about the left bifurcation without flow-limiting
stenosis. Left ICA patent distally to the skull base without
flow-limiting stenosis.

Vertebral arteries: Both of the vertebral arteries arise from the
subclavian arteries. Calcified plaque at the origin of the vertebral
arteries bilaterally with approximately 70% stenosis on the right
and 50% stenosis on the left. Multifocal atheromatous irregularity
seen throughout the vertebral arteries within the neck but remain
patent to the skull base.

Skeleton: No acute osseous abnormality. No worrisome lytic or
blastic osseous lesions. Recent sternotomy noted.

Other neck: No acute soft tissue abnormality within the neck. Right
IJ approach central venous catheter.

Upper chest: Sequelae of recent CABG. Layering bilateral pleural
effusions with associated atelectatic changes noted. Partially
visualized lungs are otherwise clear.

Review of the MIP images confirms the above findings

CTA HEAD FINDINGS

Anterior circulation: Petrous segments patent bilaterally. There is
extensive calcified atheromatous irregularity throughout the
cavernous ICAs bilaterally, slightly progressive as compared to
previous. Diameter at the proximal cavernous right ICA measures
approximately 2 mm, similar to previous. Focal stenosis at the
proximal cavernous left ICA slightly worsened now measuring
approximately 2 mm as well. Distal cavernous left ICA also measures
approximately 2 mm, similar to previous. ICAs are patent to the
termini.

A1 segments irregular but patent. Patent anterior communicating
artery. Anterior cerebral arteries patent to their distal aspects.

Approximate moderate 50% stenosis at the proximal left M1 segment.
Left M1 patent distally. Normal left MCA bifurcation. Distal left
MCA branches perfuse.

Distal small vessel atheromatous irregularity present throughout the
MCA branches bilaterally.

There is a focal severe near occlusive stenosis at the proximal
right M1 segment, worsened from previous. While subocclusive
thrombus would be difficult to exclude, this is less favored (series
M2 occlusion. Distal right MCA branches well perfused.

Posterior circulation: Moderate stenosis of the left vertebral
artery as it crosses into the cranial vault. There is additional
severe stenosis of the left V4 segment distally beyond the takeoff
of the PICA. Left PICA is patent proximally. Approximate 50%
stenosis within the right V4 segment prior to the vertebrobasilar
junction. Right PICA patent as well. Basilar artery demonstrates
multifocal atheromatous irregularity but is patent to its distal
aspect. Superior cerebral arteries patent bilaterally. Both of the
posterior cerebral arteries primarily supplied via the basilar. PCAs
irregular with multifocal moderate to severe stenoses but are patent
to their distal aspects.

Venous sinuses: Patent.

Anatomic variants: None significant.  No aneurysm.

Delayed phase: No abnormal enhancement.

Review of the MIP images confirms the above findings
IMPRESSION: 1. Negative CTA for large vessel occlusion.
2. Small volume acute ischemic left PICA territory infarct. Left
PICA is patent proximally.
3. Severe near occlusive proximal right M1 stenosis, worsened
relative to prior CTA from 12/31/2016. While subocclusive thrombus
would be difficult to exclude, this is felt to be less likely. Query
an episode of recent hypotension, which superimposed on the
underlying stenosis could result in patient's left-sided symptoms.
4. Advanced atherosclerotic change affecting the cavernous ICAs
bilaterally, left greater than right, slightly progressed from
previous.
5. Short-segment 50% proximal left M1 stenosis.
6. Additional advanced for age diffuse atherosclerotic change
throughout the extracranial intracranial circulation as above.

Critical Value/emergent results were called by telephone at the time
of interpretation on 01/23/2018 at [DATE] to Dr. SHANTI HINDS, who
verbally acknowledged these results.

## 2019-08-25 IMAGING — DX DG CHEST 1V PORT
1 series · 1 of 1 positions shown · non-contrast
Comparison: 01/22/2018 and earlier.

CLINICAL DATA: 46-year-old male status status post CABG postop day
1.

EXAM:
PORTABLE CHEST 1 VIEW

[chest ap]
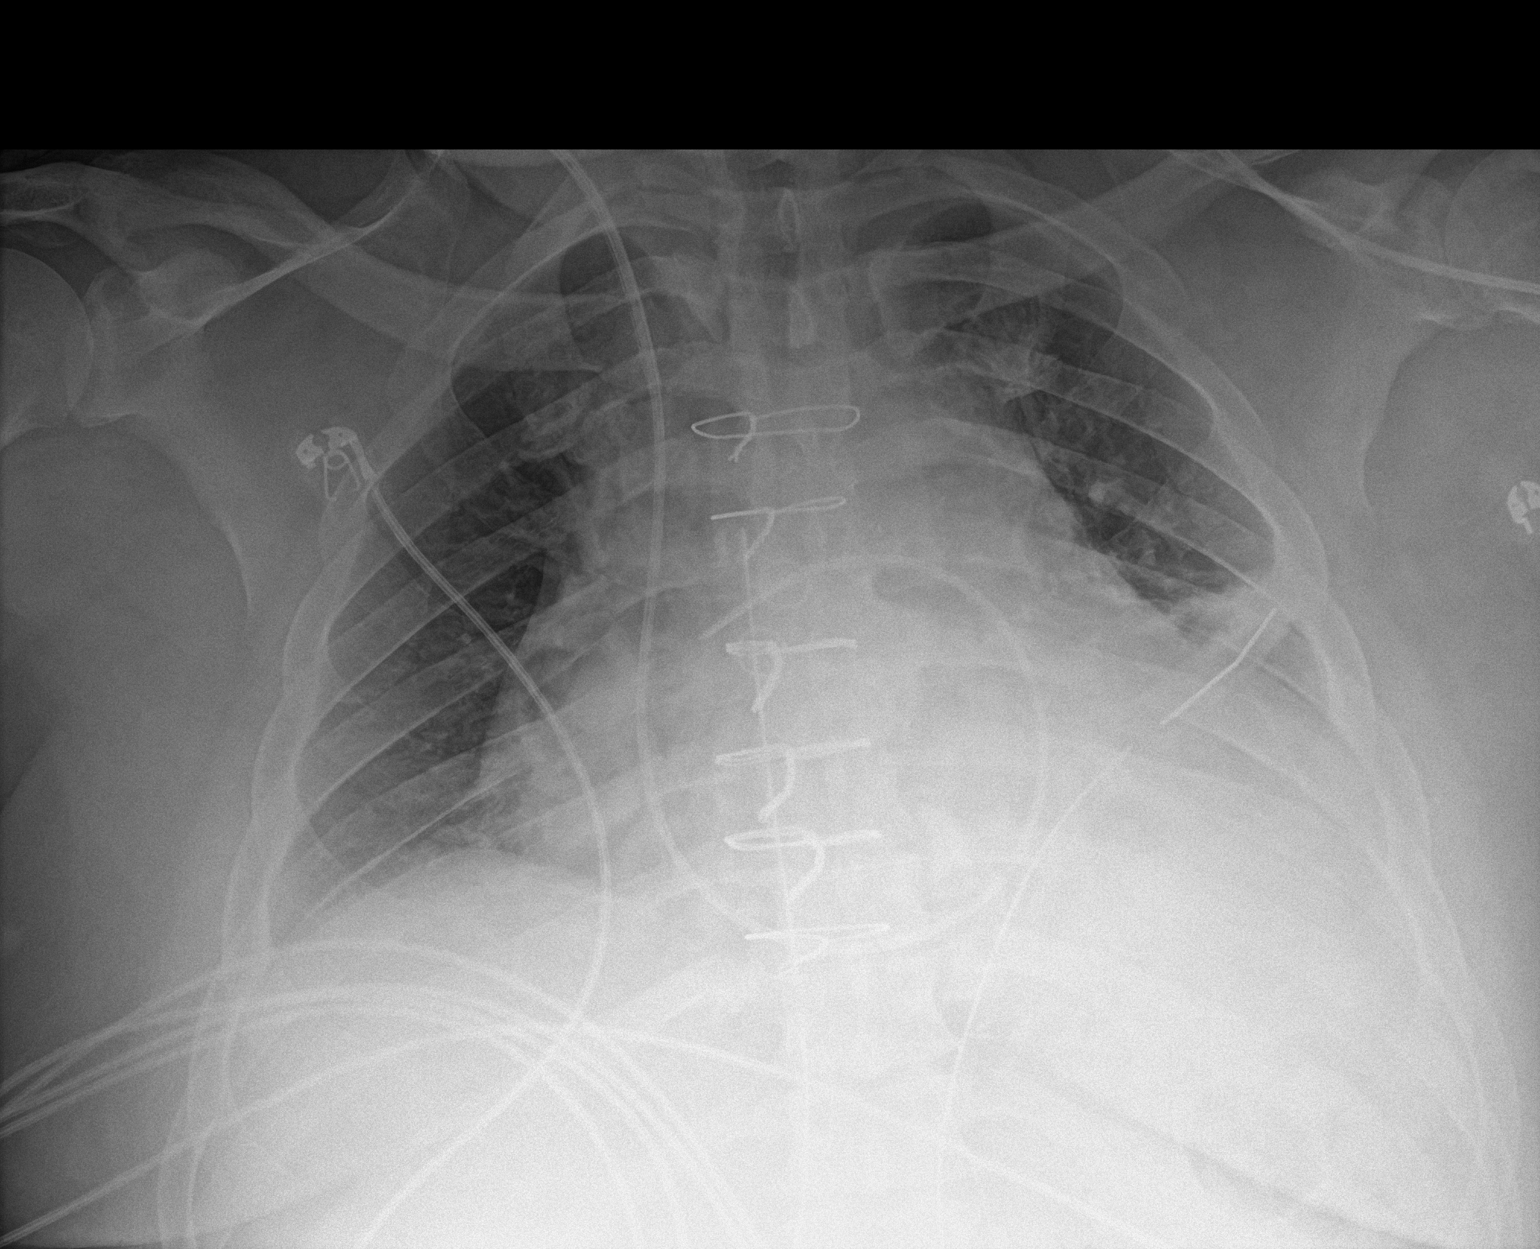

[1 of 1 positions shown; findings below may reference images not displayed]

FINDINGS: Portable AP semi upright view at 1030 hours. Extubated. Enteric tube
removed. right IJ Swan-Ganz catheter re-demonstrated, tip now at the
right main pulmonary artery level. Stable left chest tube and
mediastinal tube.

Mildly lower lung volumes. Obscuration of the left hemidiaphragm. No
pneumothorax. Probable small left pleural effusion. No pulmonary
edema. Stable cardiac size and mediastinal contours. Negative
visible bowel gas pattern.
IMPRESSION: 1. Extubated and enteric tube removed. Otherwise stable lines and
tubes.
2. Mildly lower lung volumes with increased left lung base
atelectasis. Probable small left pleural effusion.
3. No pneumothorax or pulmonary edema.
